# Patient Record
Sex: Female | Born: 1980 | Race: Black or African American | Hispanic: No | Marital: Married | Smoking: Never smoker
Health system: Southern US, Community
[De-identification: ages and names within clinical notes are randomized; demographics above are authoritative.]

## PROBLEM LIST (undated history)

## (undated) ENCOUNTER — Ambulatory Visit (HOSPITAL_COMMUNITY)

## (undated) DIAGNOSIS — E669 Obesity, unspecified: Secondary | ICD-10-CM

## (undated) DIAGNOSIS — I10 Essential (primary) hypertension: Secondary | ICD-10-CM

## (undated) DIAGNOSIS — E119 Type 2 diabetes mellitus without complications: Secondary | ICD-10-CM

## (undated) DIAGNOSIS — U071 COVID-19: Secondary | ICD-10-CM

## (undated) DIAGNOSIS — M79673 Pain in unspecified foot: Secondary | ICD-10-CM

## (undated) DIAGNOSIS — J45909 Unspecified asthma, uncomplicated: Secondary | ICD-10-CM

## (undated) DIAGNOSIS — F419 Anxiety disorder, unspecified: Secondary | ICD-10-CM

## (undated) HISTORY — PX: CHOLECYSTECTOMY: SHX55

## (undated) HISTORY — PX: BREAST REDUCTION SURGERY: SHX8

---

## 2001-06-16 ENCOUNTER — Emergency Department (HOSPITAL_COMMUNITY): Admission: EM | Admit: 2001-06-16 | Discharge: 2001-06-16 | Payer: Self-pay | Admitting: Emergency Medicine

## 2001-08-10 ENCOUNTER — Ambulatory Visit (HOSPITAL_COMMUNITY): Admission: RE | Admit: 2001-08-10 | Discharge: 2001-08-10 | Payer: Self-pay | Admitting: *Deleted

## 2001-08-13 ENCOUNTER — Inpatient Hospital Stay (HOSPITAL_COMMUNITY): Admission: AD | Admit: 2001-08-13 | Discharge: 2001-08-13 | Payer: Self-pay | Admitting: *Deleted

## 2001-08-26 ENCOUNTER — Inpatient Hospital Stay (HOSPITAL_COMMUNITY): Admission: AD | Admit: 2001-08-26 | Discharge: 2001-08-26 | Payer: Self-pay | Admitting: *Deleted

## 2001-09-09 ENCOUNTER — Inpatient Hospital Stay (HOSPITAL_COMMUNITY): Admission: AD | Admit: 2001-09-09 | Discharge: 2001-09-13 | Payer: Self-pay | Admitting: *Deleted

## 2001-09-09 ENCOUNTER — Encounter (INDEPENDENT_AMBULATORY_CARE_PROVIDER_SITE_OTHER): Payer: Self-pay | Admitting: Specialist

## 2004-05-12 HISTORY — PX: REDUCTION MAMMAPLASTY: SUR839

## 2007-09-03 ENCOUNTER — Emergency Department (HOSPITAL_COMMUNITY): Admission: EM | Admit: 2007-09-03 | Discharge: 2007-09-04 | Payer: Self-pay | Admitting: Emergency Medicine

## 2007-11-06 ENCOUNTER — Emergency Department (HOSPITAL_COMMUNITY): Admission: EM | Admit: 2007-11-06 | Discharge: 2007-11-06 | Payer: Self-pay | Admitting: Emergency Medicine

## 2008-01-22 ENCOUNTER — Emergency Department (HOSPITAL_COMMUNITY): Admission: EM | Admit: 2008-01-22 | Discharge: 2008-01-22 | Payer: Self-pay | Admitting: Emergency Medicine

## 2008-04-11 ENCOUNTER — Emergency Department (HOSPITAL_COMMUNITY): Admission: EM | Admit: 2008-04-11 | Discharge: 2008-04-11 | Payer: Self-pay | Admitting: Family Medicine

## 2008-08-16 ENCOUNTER — Inpatient Hospital Stay (HOSPITAL_COMMUNITY): Admission: AD | Admit: 2008-08-16 | Discharge: 2008-08-17 | Payer: Self-pay | Admitting: Obstetrics & Gynecology

## 2008-08-17 IMAGING — US US TRANSVAGINAL NON-OB
1 series · 14 of 25 positions shown · non-contrast
Comparison: None

CLINICAL DATA: Pelvic pain.

TRANSABDOMINAL AND TRANSVAGINAL ULTRASOUND OF PELVIS
TECHNIQUE: Both transabdominal and transvaginal ultrasound
examinations of the pelvis were performed including evaluation of
the uterus, ovaries, adnexal regions, and pelvic cul-de-sac.

[Series 1: us transvaginal non-ob · 14 of 50 slices shown]
[im 1/50]
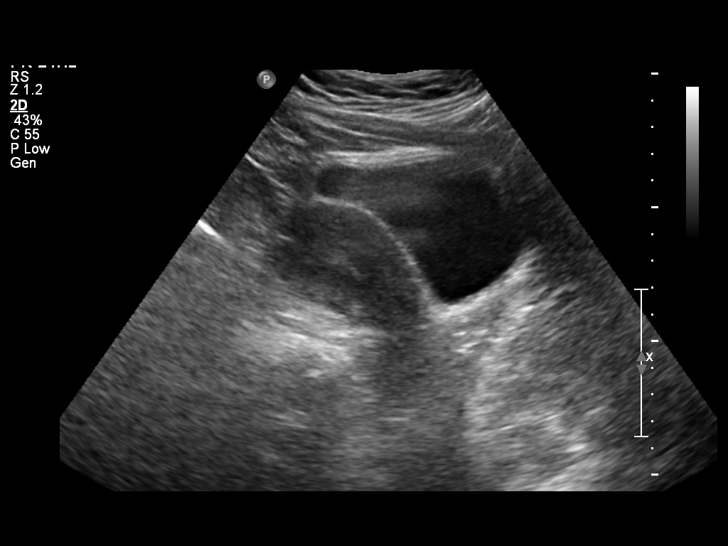
[im 5/50]
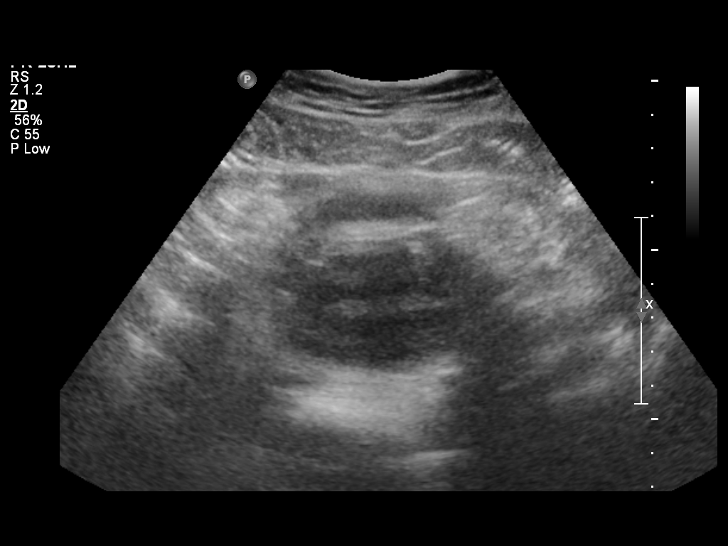
[im 9/50]
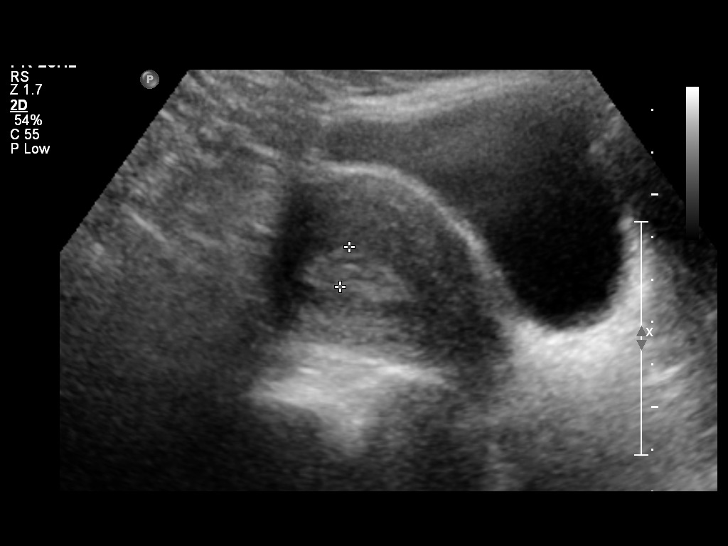
[im 13/50]
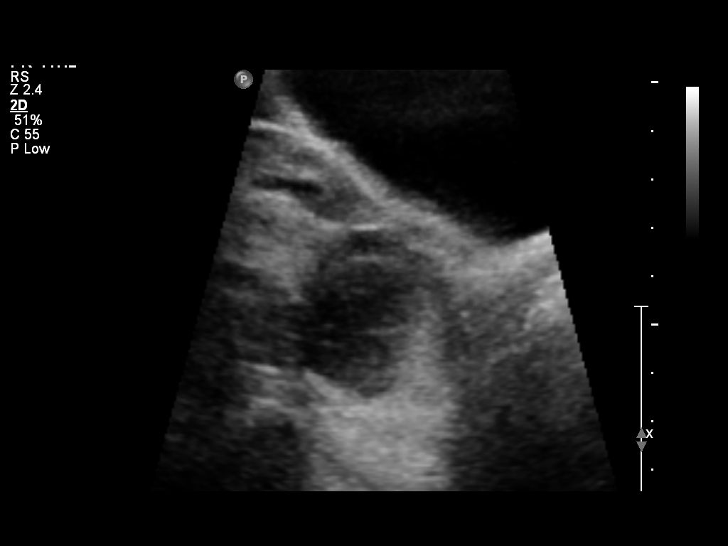
[im 17/50]
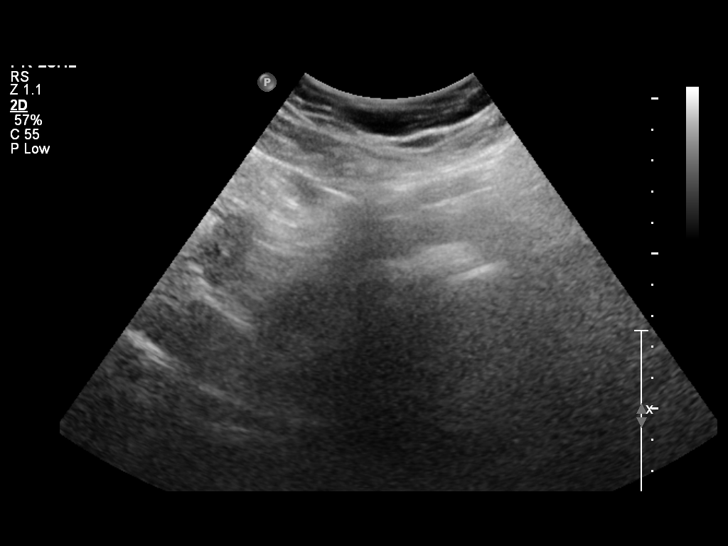
[im 19/50]
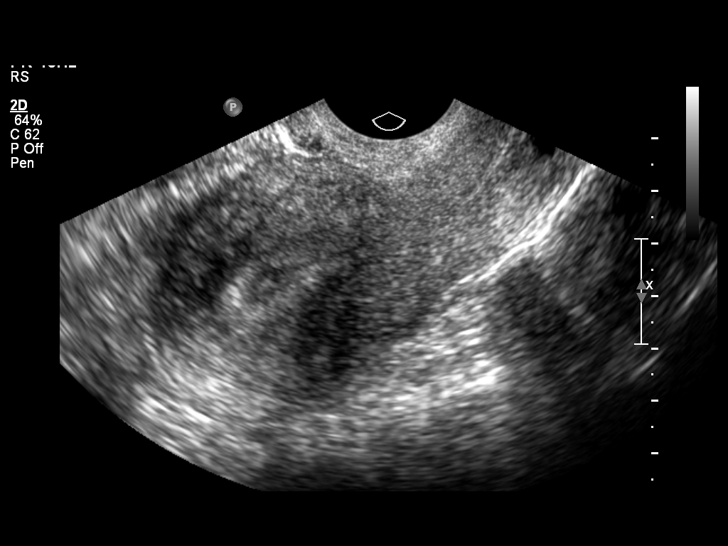
[im 23/50]
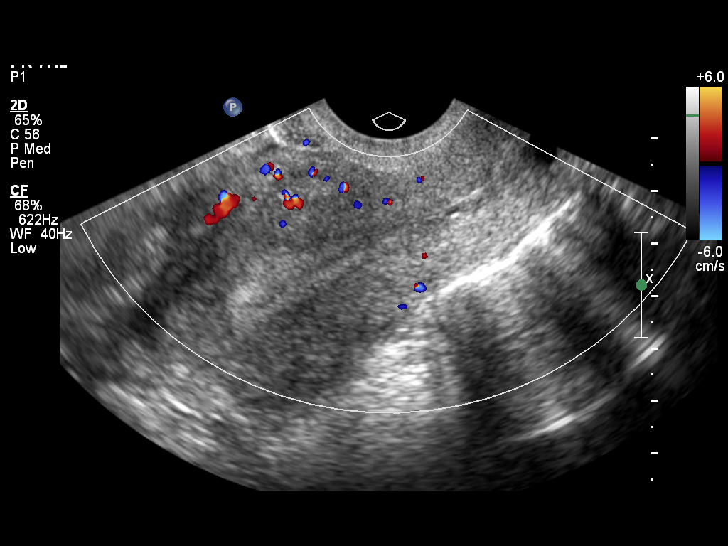
[im 27/50]
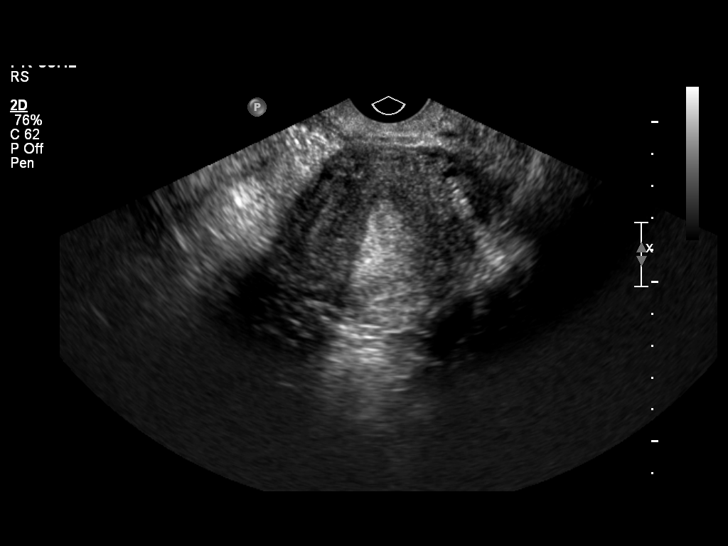
[im 31/50]
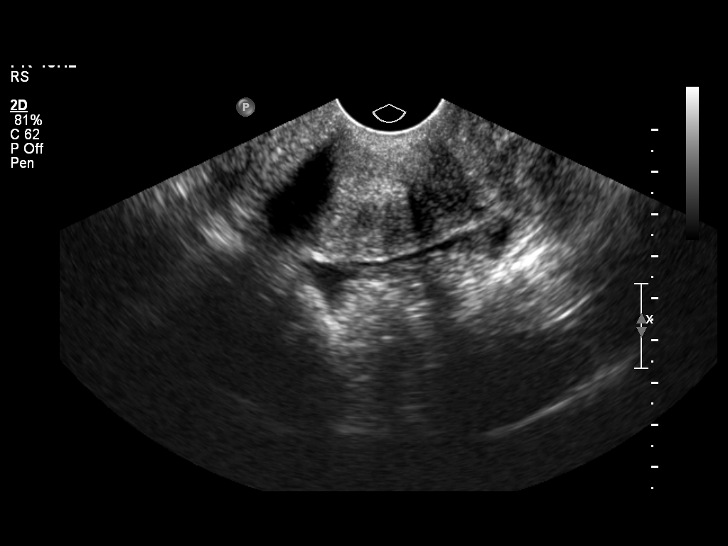
[im 33/50]
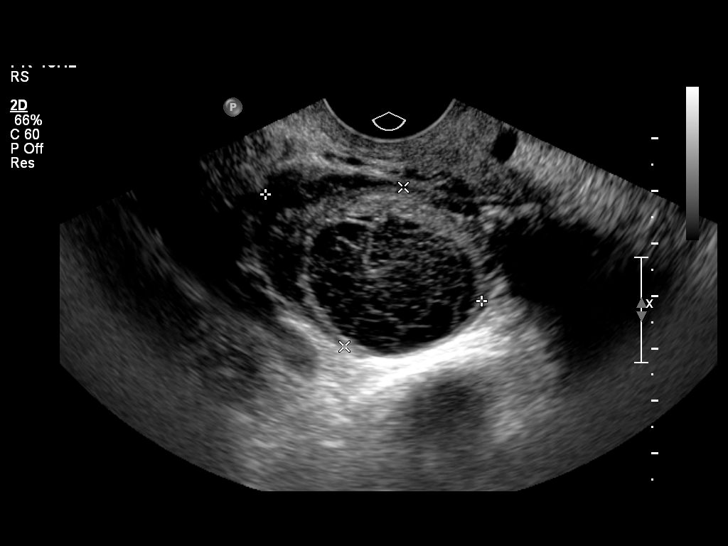
[im 37/50]
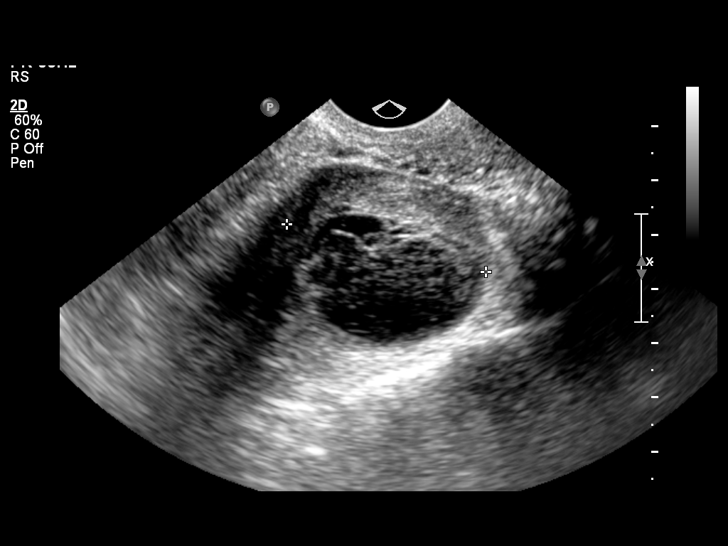
[im 41/50]
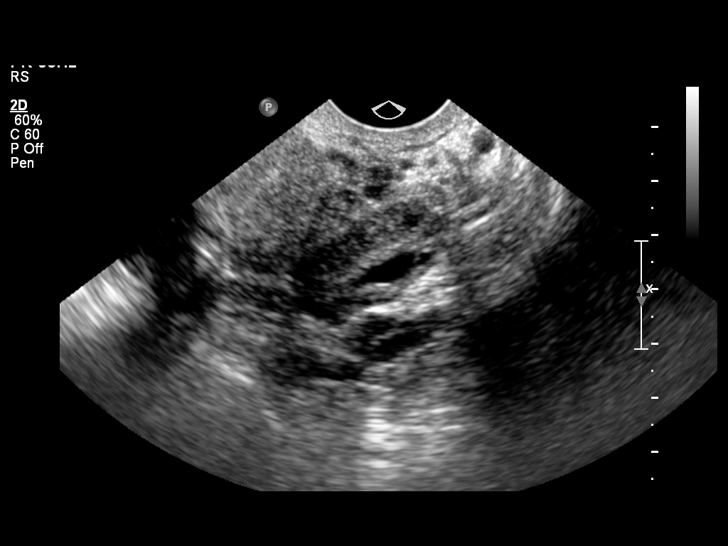
[im 45/50]
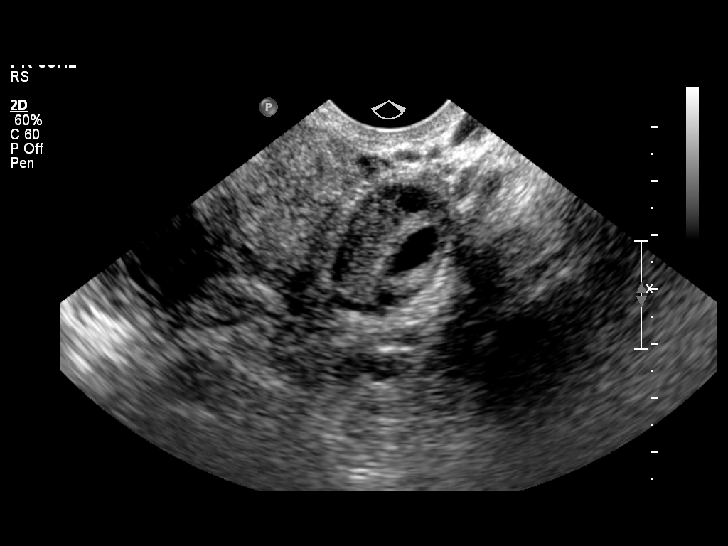
[im 50/50]
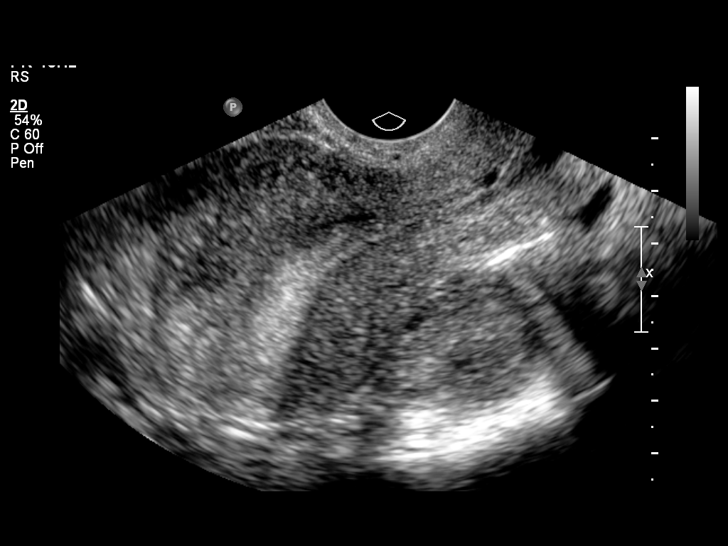

[14 of 25 positions shown; findings below may reference images not displayed]

FINDINGS: Uterus:  Normal size and appearance. No fibroids other uterine mass
identified.

Endometrium:  Within normal limits.

Right Ovary:  Complex cyst with fine thin internal septations and
no blood flow seen on color Doppler ultrasound.  This measures
by 2.6 x 2.8 cm, and likely represents a hemorrhagic ovarian cyst
although endometrioma can have a similar appearance.

Left Ovary:  Normal size and appearance.

Other Findings:  No evidence of free fluid.
IMPRESSION: 1.  3.1 cm complex right ovarian cyst, which likely represents a
hemorrhagic cyst.  Endometrioma can have a similar appearance.
Follow-up by transvaginal ultrasound is recommended in 68 weeks.
2.  Normal uterus and left ovary.

## 2008-08-27 ENCOUNTER — Inpatient Hospital Stay (HOSPITAL_COMMUNITY): Admission: AD | Admit: 2008-08-27 | Discharge: 2008-08-27 | Payer: Self-pay | Admitting: Obstetrics & Gynecology

## 2008-09-07 ENCOUNTER — Emergency Department (HOSPITAL_COMMUNITY): Admission: EM | Admit: 2008-09-07 | Discharge: 2008-09-07 | Payer: Self-pay | Admitting: Family Medicine

## 2008-10-28 ENCOUNTER — Inpatient Hospital Stay (HOSPITAL_COMMUNITY): Admission: AD | Admit: 2008-10-28 | Discharge: 2008-10-28 | Payer: Self-pay | Admitting: Obstetrics & Gynecology

## 2008-11-01 ENCOUNTER — Inpatient Hospital Stay (HOSPITAL_COMMUNITY): Admission: AD | Admit: 2008-11-01 | Discharge: 2008-11-01 | Payer: Self-pay | Admitting: Obstetrics & Gynecology

## 2008-11-13 ENCOUNTER — Inpatient Hospital Stay (HOSPITAL_COMMUNITY): Admission: AD | Admit: 2008-11-13 | Discharge: 2008-11-13 | Payer: Self-pay | Admitting: Obstetrics & Gynecology

## 2009-04-26 ENCOUNTER — Inpatient Hospital Stay (HOSPITAL_COMMUNITY): Admission: AD | Admit: 2009-04-26 | Discharge: 2009-04-26 | Payer: Self-pay | Admitting: Obstetrics & Gynecology

## 2009-04-28 ENCOUNTER — Emergency Department (HOSPITAL_COMMUNITY): Admission: EM | Admit: 2009-04-28 | Discharge: 2009-04-28 | Payer: Self-pay | Admitting: Emergency Medicine

## 2009-04-28 IMAGING — CR DG CHEST 2V
2 series · 2 of 2 positions shown · non-contrast
Comparison: None

CLINICAL DATA: Cough, congestion.

CHEST - 2 VIEW

[w chest pa]
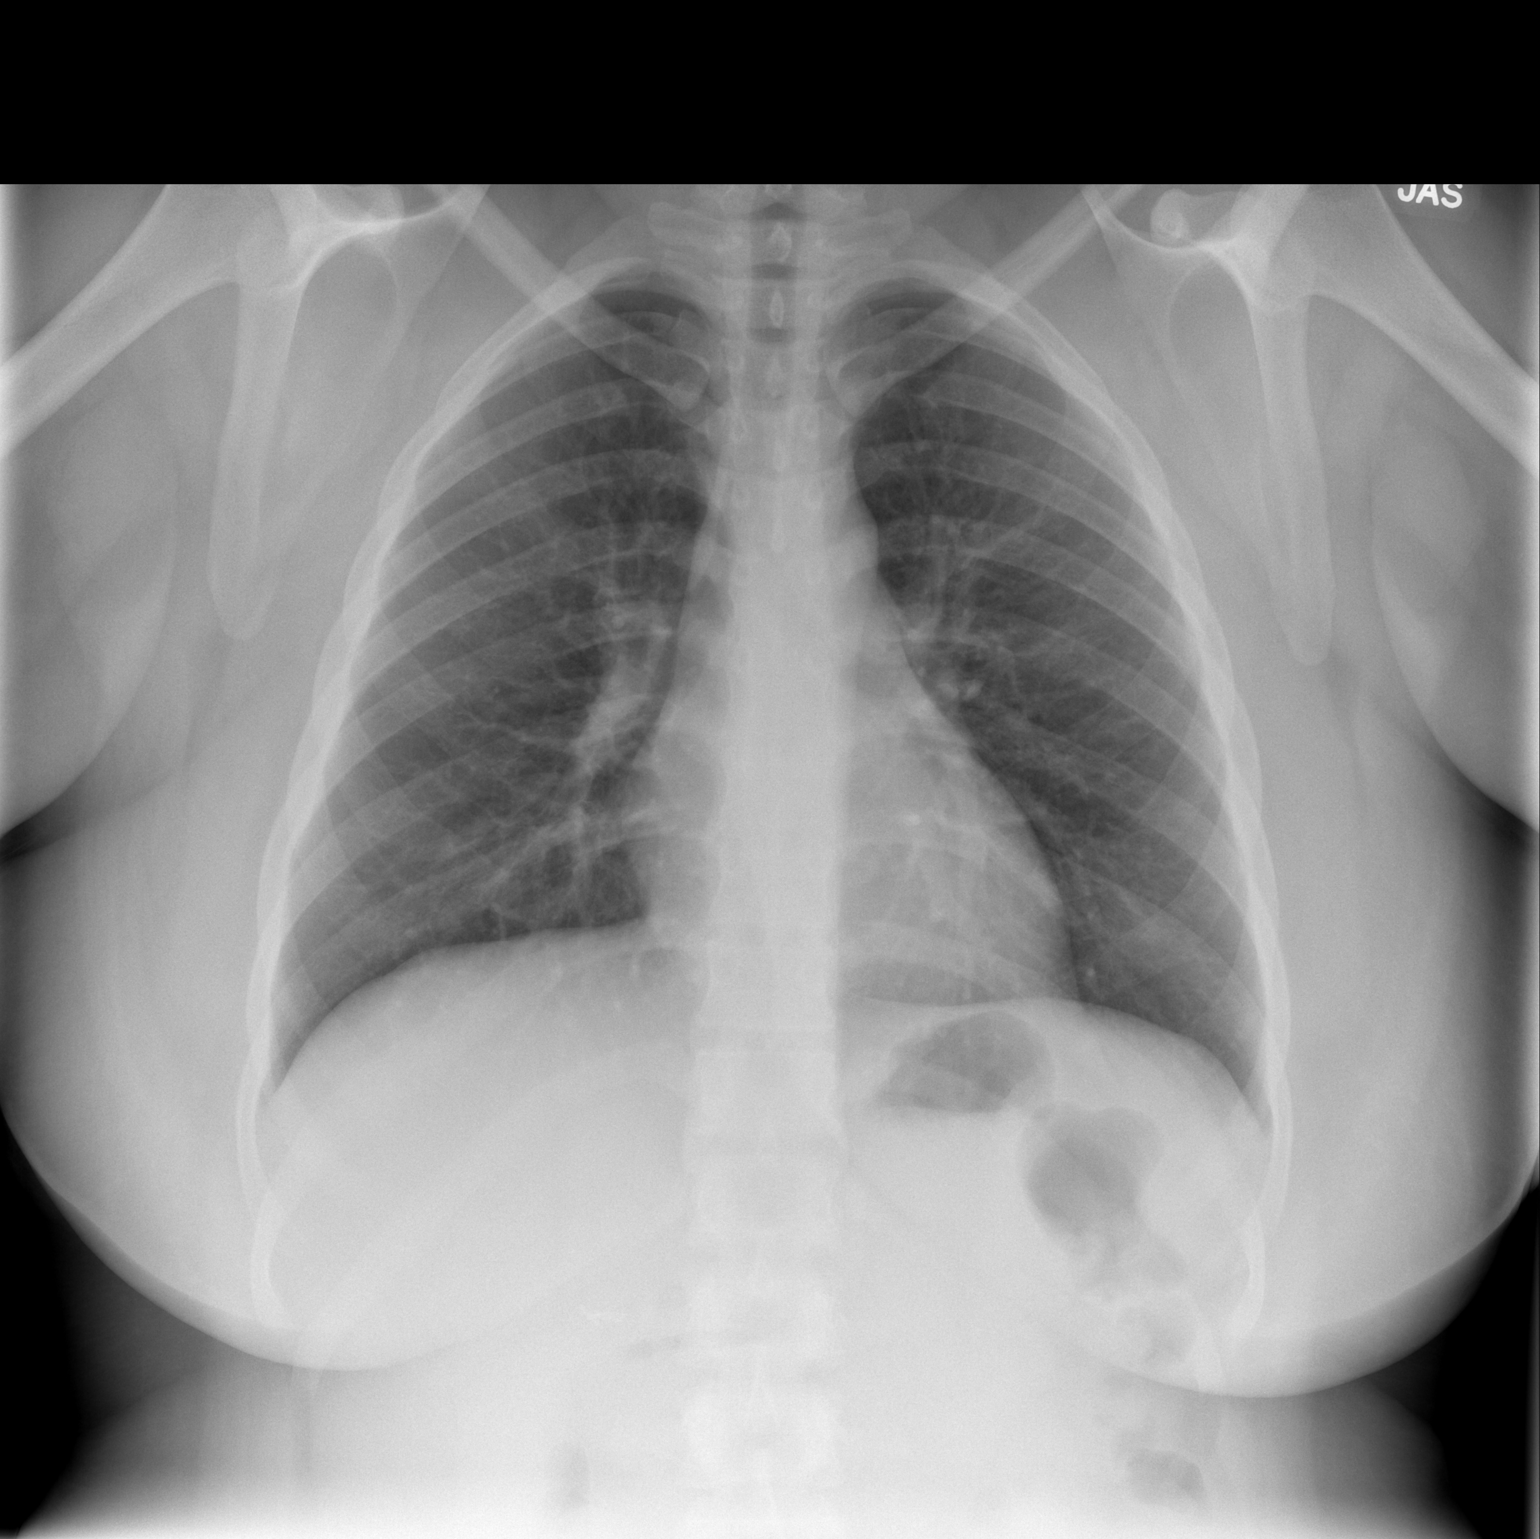

[w chest lat]
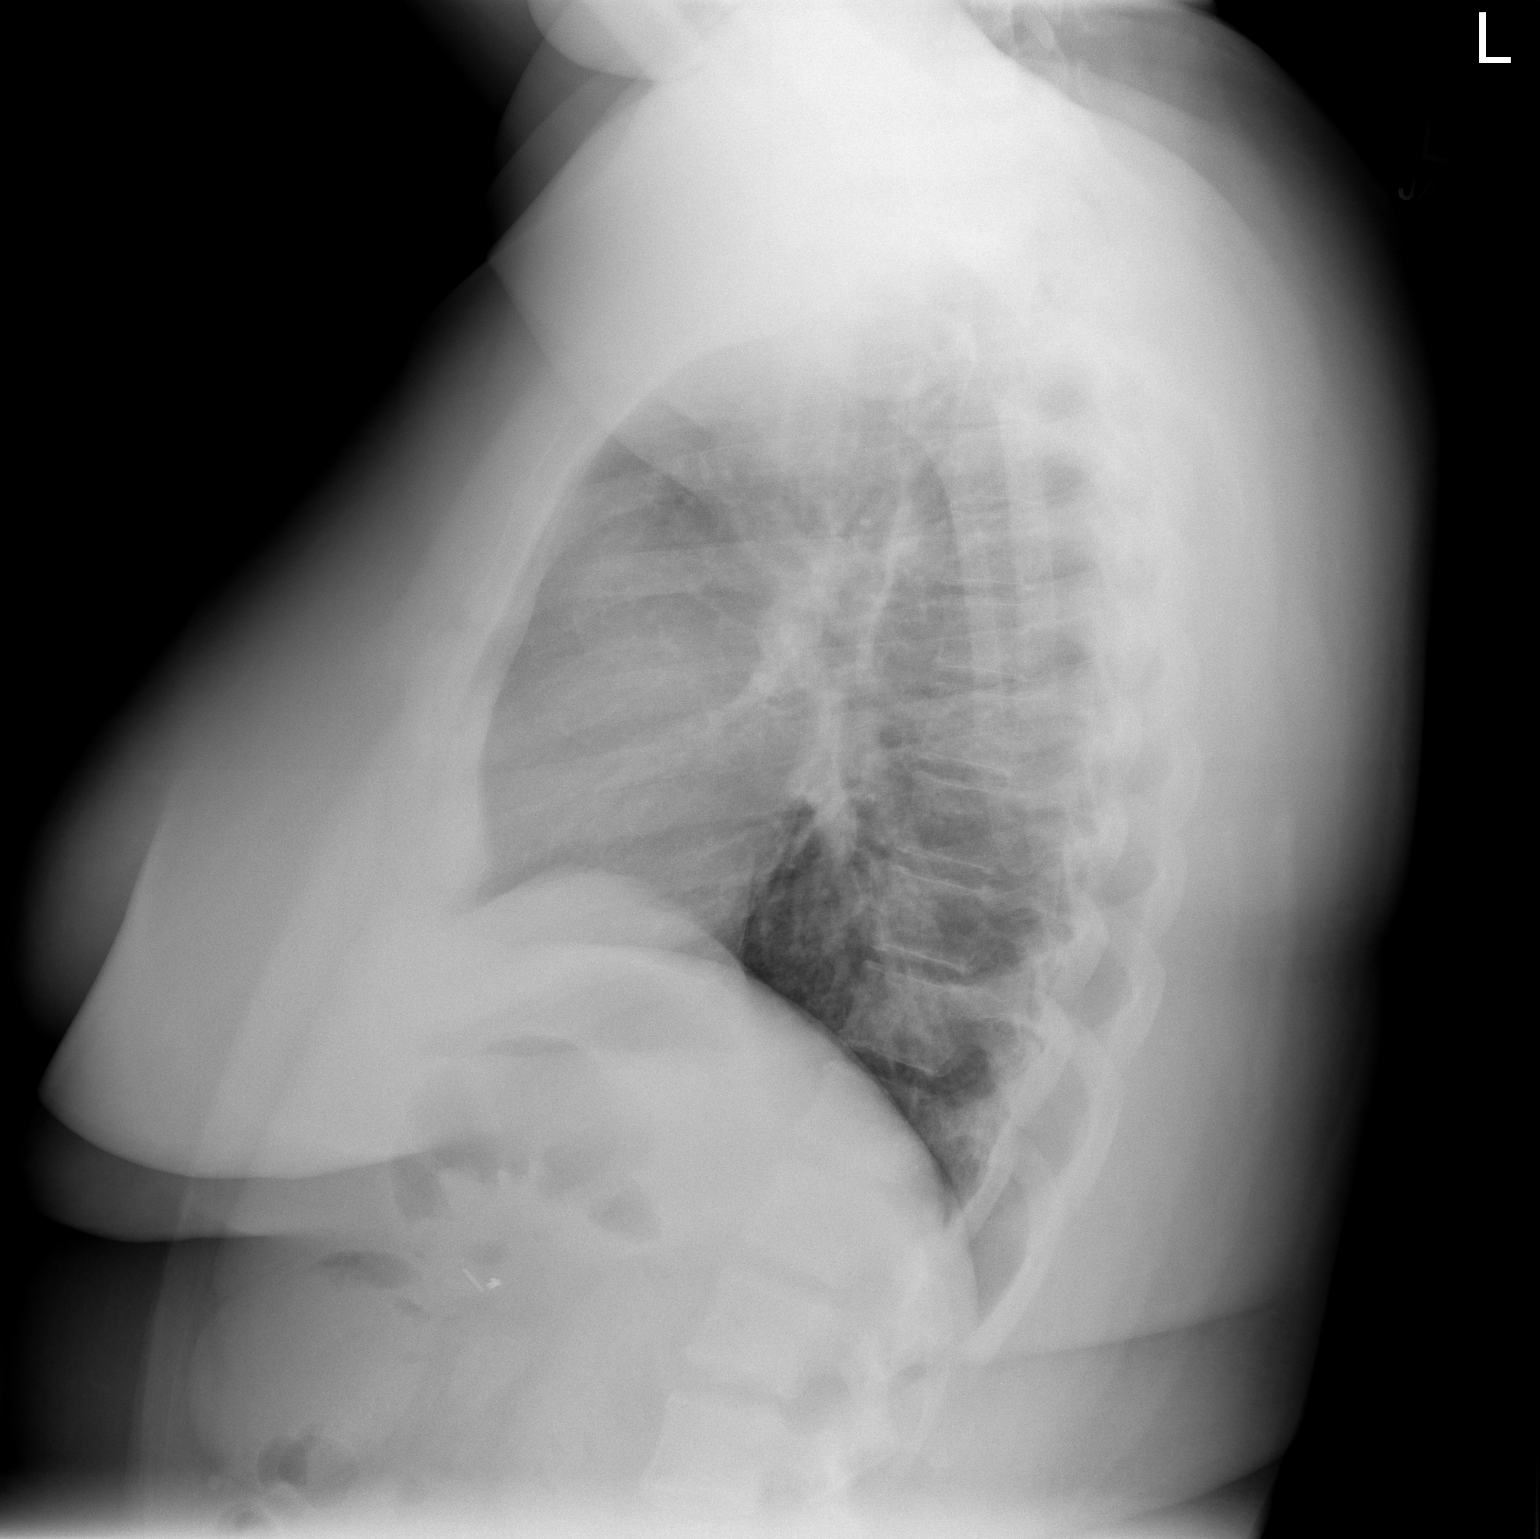

[2 of 2 positions shown; findings below may reference images not displayed]

FINDINGS: Heart and mediastinal contours are within normal limits.
No focal opacities or effusions.  No acute bony abnormality.
IMPRESSION: No active disease.

## 2009-05-20 ENCOUNTER — Inpatient Hospital Stay (HOSPITAL_COMMUNITY): Admission: AD | Admit: 2009-05-20 | Discharge: 2009-05-20 | Payer: Self-pay | Admitting: Obstetrics & Gynecology

## 2009-05-20 IMAGING — US US OB COMP LESS 14 WK
1 series · 14 of 28 positions shown · non-contrast
Comparison: none

OBSTETRICAL ULTRASOUND:
 This ultrasound exam was performed in the [HOSPITAL] Ultrasound Department.  The OB US report was generated in the AS system, and faxed to the ordering physician.  This report is also available in [HOSPITAL]?s [employer] and in [REDACTED] PACS.

[Series 1: us ob transvaginal modify · 0.21mm/px · 14 of 39 slices shown]
[im 2/39]
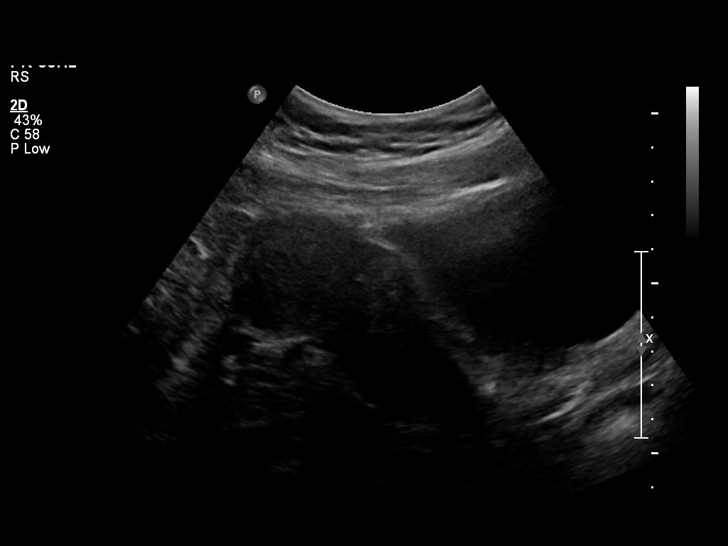
[im 5/39]
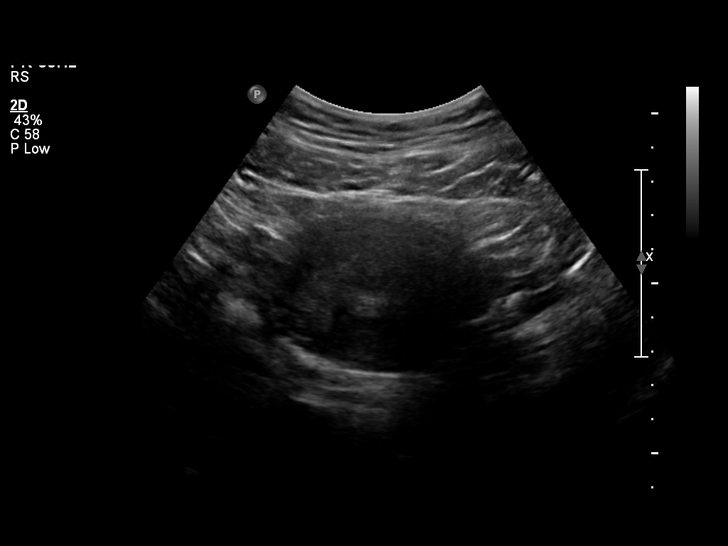
[im 8/39]
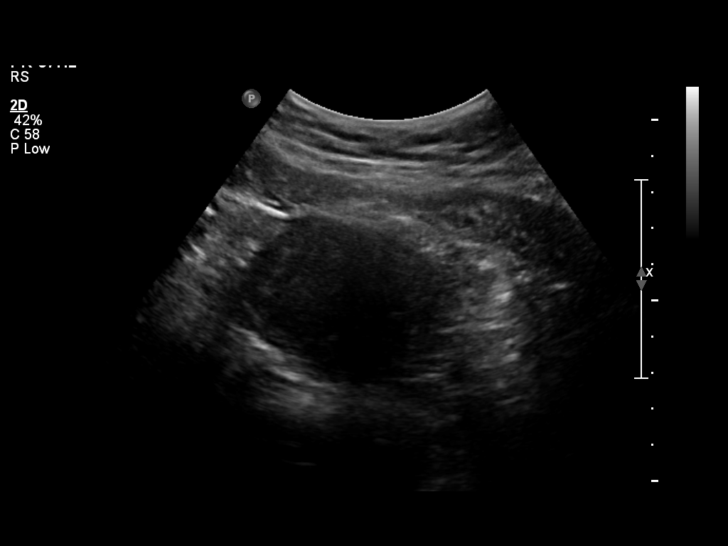
[im 10/39]
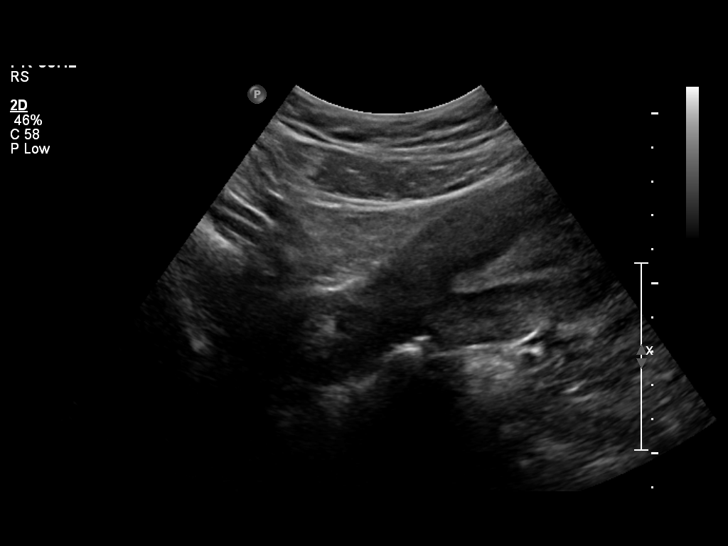
[im 13/39]
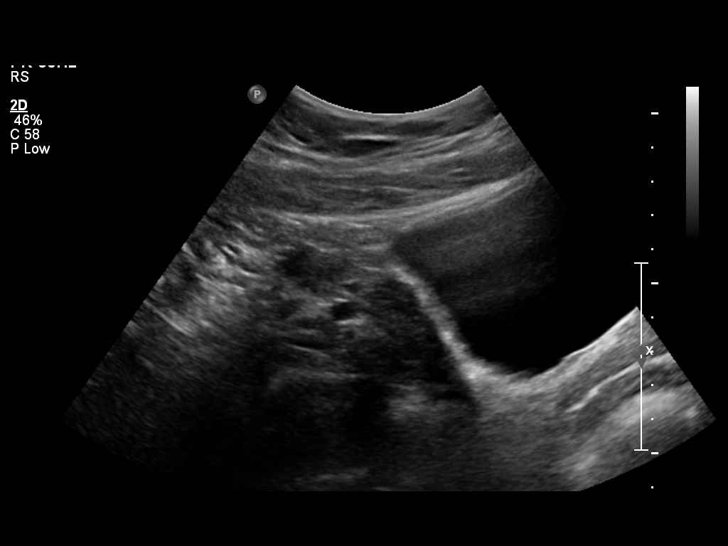
[im 16/39]
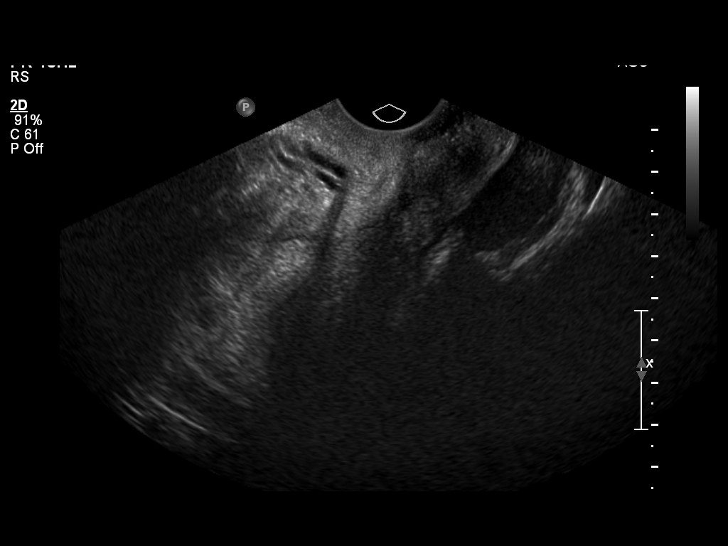
[im 19/39]
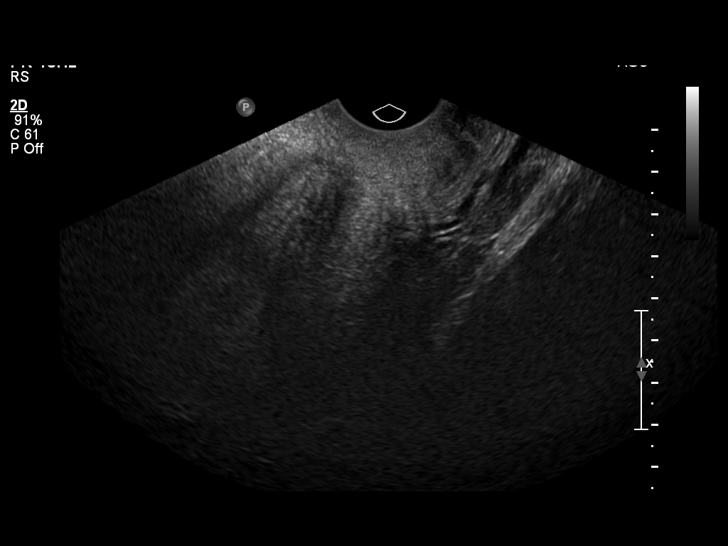
[im 22/39]
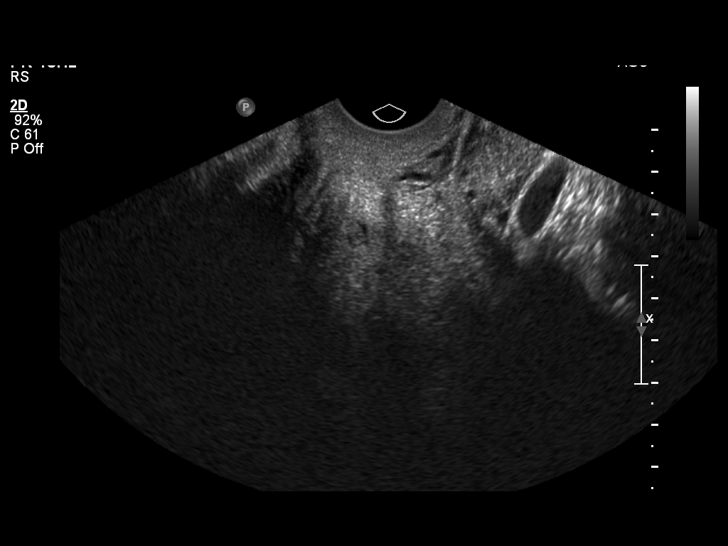
[im 24/39]
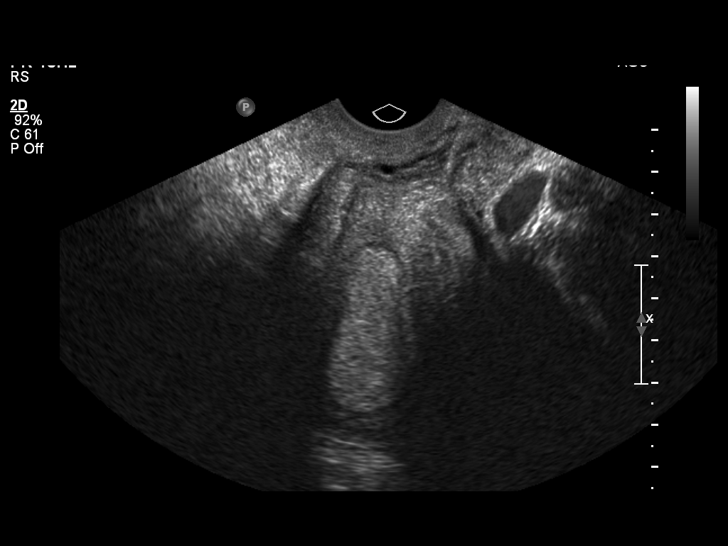
[im 27/39]
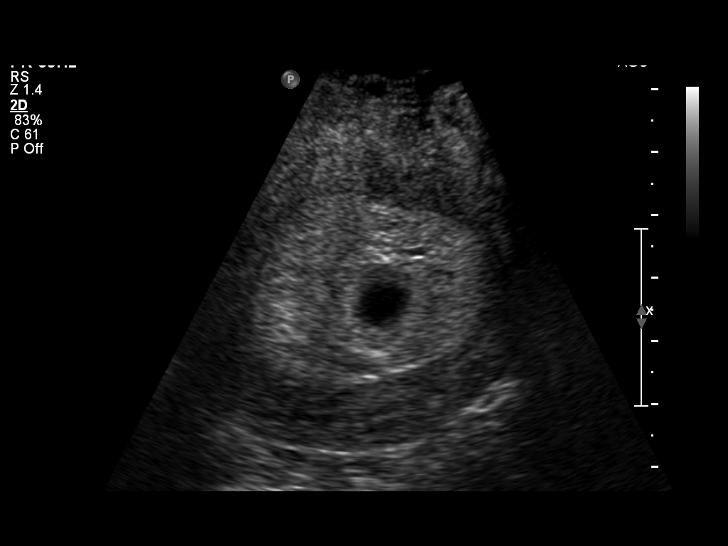
[im 30/39]
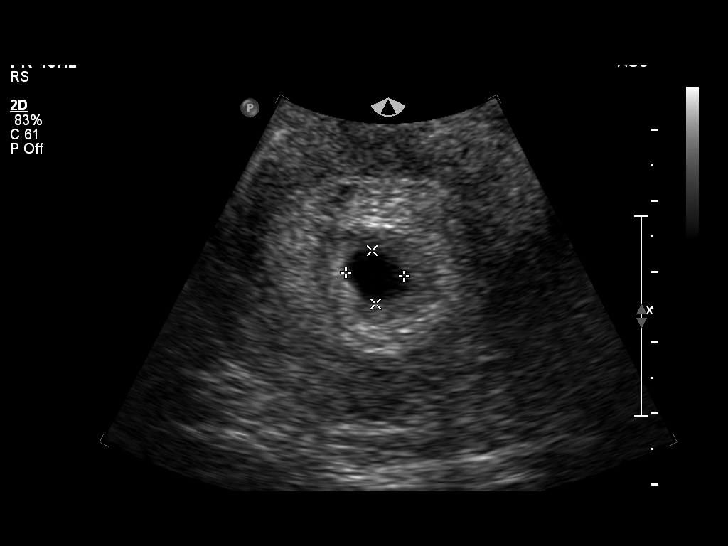
[im 33/39]
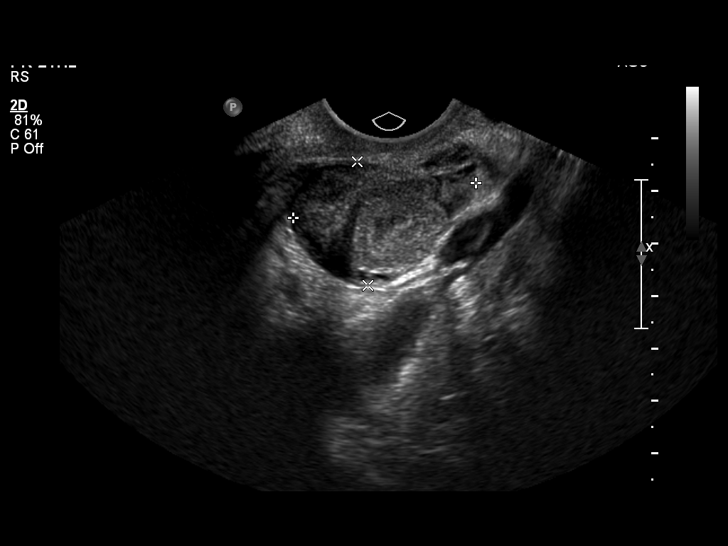
[im 36/39]
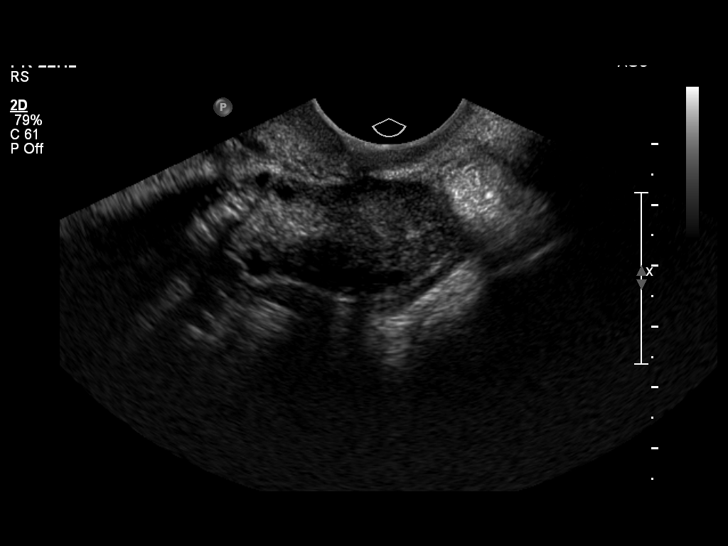
[im 39/39]
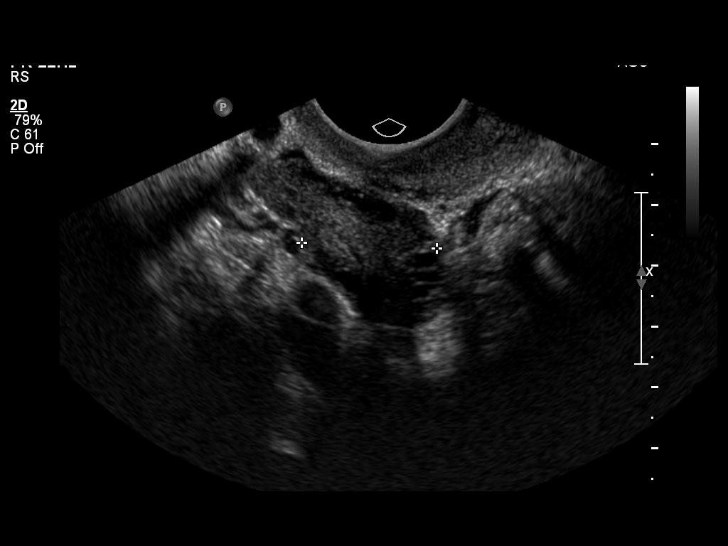

[14 of 28 positions shown; findings below may reference images not displayed]

IMPRESSION: See AS Obstetric US report.

## 2009-06-07 ENCOUNTER — Emergency Department (HOSPITAL_COMMUNITY): Admission: EM | Admit: 2009-06-07 | Discharge: 2009-06-08 | Payer: Self-pay | Admitting: Emergency Medicine

## 2010-05-14 ENCOUNTER — Emergency Department (HOSPITAL_COMMUNITY)
Admission: EM | Admit: 2010-05-14 | Discharge: 2010-05-14 | Payer: Self-pay | Source: Home / Self Care | Admitting: Emergency Medicine

## 2010-06-02 ENCOUNTER — Encounter: Payer: Self-pay | Admitting: Obstetrics & Gynecology

## 2010-06-04 ENCOUNTER — Emergency Department (HOSPITAL_COMMUNITY)
Admission: EM | Admit: 2010-06-04 | Discharge: 2010-06-04 | Payer: Self-pay | Source: Home / Self Care | Admitting: Emergency Medicine

## 2010-06-24 ENCOUNTER — Emergency Department (HOSPITAL_COMMUNITY)
Admission: EM | Admit: 2010-06-24 | Discharge: 2010-06-25 | Disposition: A | Discharge status: Home / Self Care | Payer: Medicaid Other | Attending: Emergency Medicine | Admitting: Emergency Medicine

## 2010-06-24 DIAGNOSIS — R42 Dizziness and giddiness: Secondary | ICD-10-CM | POA: Insufficient documentation

## 2010-06-24 DIAGNOSIS — E669 Obesity, unspecified: Secondary | ICD-10-CM | POA: Insufficient documentation

## 2010-06-24 DIAGNOSIS — E876 Hypokalemia: Secondary | ICD-10-CM | POA: Insufficient documentation

## 2010-06-24 DIAGNOSIS — E860 Dehydration: Secondary | ICD-10-CM | POA: Insufficient documentation

## 2010-06-24 DIAGNOSIS — G4730 Sleep apnea, unspecified: Secondary | ICD-10-CM | POA: Insufficient documentation

## 2010-06-24 DIAGNOSIS — I10 Essential (primary) hypertension: Secondary | ICD-10-CM | POA: Insufficient documentation

## 2010-06-25 LAB — DIFFERENTIAL
Basophils Absolute: 0.1 10*3/uL (ref 0.0–0.1)
Basophils Relative: 0 % (ref 0–1)
Eosinophils Absolute: 0.3 10*3/uL (ref 0.0–0.7)
Monocytes Absolute: 1.3 10*3/uL — ABNORMAL HIGH (ref 0.1–1.0)
Monocytes Relative: 10 % (ref 3–12)
Neutrophils Relative %: 44 % (ref 43–77)

## 2010-06-25 LAB — BASIC METABOLIC PANEL
Calcium: 9.3 mg/dL (ref 8.4–10.5)
GFR calc non Af Amer: >60 mL/min (ref >60)
Sodium: 140 mEq/L (ref 135–145)

## 2010-06-25 LAB — URINALYSIS, ROUTINE W REFLEX MICROSCOPIC
Hgb urine dipstick: NEGATIVE
Nitrite: NEGATIVE
Protein, ur: NEGATIVE mg/dL
Urobilinogen, UA: 1 mg/dL (ref 0.0–1.0)

## 2010-06-25 LAB — CBC
MCH: 27.8 pg (ref 26.0–34.0)
MCHC: 35 g/dL (ref 30.0–36.0)
Platelets: 290 10*3/uL (ref 150–400)

## 2010-06-25 LAB — POCT PREGNANCY, URINE

## 2010-07-01 ENCOUNTER — Ambulatory Visit: Payer: Medicaid Other | Attending: Orthopedic Surgery | Admitting: Physical Therapy

## 2010-07-01 DIAGNOSIS — IMO0001 Reserved for inherently not codable concepts without codable children: Secondary | ICD-10-CM | POA: Insufficient documentation

## 2010-07-01 DIAGNOSIS — M25673 Stiffness of unspecified ankle, not elsewhere classified: Secondary | ICD-10-CM | POA: Insufficient documentation

## 2010-07-01 DIAGNOSIS — M25579 Pain in unspecified ankle and joints of unspecified foot: Secondary | ICD-10-CM | POA: Insufficient documentation

## 2010-07-01 DIAGNOSIS — M25676 Stiffness of unspecified foot, not elsewhere classified: Secondary | ICD-10-CM | POA: Insufficient documentation

## 2010-07-10 ENCOUNTER — Ambulatory Visit: Payer: Medicaid Other | Admitting: Physical Therapy

## 2010-07-18 ENCOUNTER — Ambulatory Visit: Payer: Medicaid Other | Attending: Orthopedic Surgery | Admitting: Physical Therapy

## 2010-07-18 DIAGNOSIS — IMO0001 Reserved for inherently not codable concepts without codable children: Secondary | ICD-10-CM | POA: Insufficient documentation

## 2010-07-18 DIAGNOSIS — M25676 Stiffness of unspecified foot, not elsewhere classified: Secondary | ICD-10-CM | POA: Insufficient documentation

## 2010-07-18 DIAGNOSIS — M25579 Pain in unspecified ankle and joints of unspecified foot: Secondary | ICD-10-CM | POA: Insufficient documentation

## 2010-07-18 DIAGNOSIS — M25673 Stiffness of unspecified ankle, not elsewhere classified: Secondary | ICD-10-CM | POA: Insufficient documentation

## 2010-07-26 ENCOUNTER — Ambulatory Visit: Payer: Medicaid Other | Admitting: Physical Therapy

## 2010-07-28 LAB — CBC
Hemoglobin: 12.4 g/dL (ref 12.0–15.0)
MCHC: 33.3 g/dL (ref 30.0–36.0)
Platelets: 262 10*3/uL (ref 150–400)
RDW: 13.8 % (ref 11.5–15.5)

## 2010-07-28 LAB — ABO/RH: ABO/RH(D): O POS

## 2010-08-12 LAB — DIFFERENTIAL
Lymphocytes Relative: 23 % (ref 12–46)
Lymphs Abs: 2.7 10*3/uL (ref 0.7–4.0)
Neutro Abs: 7.7 10*3/uL (ref 1.7–7.7)
Neutrophils Relative %: 65 % (ref 43–77)

## 2010-08-12 LAB — COMPREHENSIVE METABOLIC PANEL
ALT: 27 U/L (ref 0–35)
Alkaline Phosphatase: 81 U/L (ref 39–117)
CO2: 31 mEq/L (ref 19–32)
GFR calc non Af Amer: >60 mL/min (ref >60)
Glucose, Bld: 103 mg/dL — ABNORMAL HIGH (ref 70–99)
Potassium: 4.2 mEq/L (ref 3.5–5.1)
Sodium: 137 mEq/L (ref 135–145)

## 2010-08-12 LAB — URINE MICROSCOPIC-ADD ON

## 2010-08-12 LAB — CBC
Platelets: 298 10*3/uL (ref 150–400)
WBC: 11.9 10*3/uL — ABNORMAL HIGH (ref 4.0–10.5)

## 2010-08-12 LAB — URINE CULTURE: Colony Count: >=100000

## 2010-08-12 LAB — WET PREP, GENITAL
Trich, Wet Prep: NONE SEEN
WBC, Wet Prep HPF POC: NONE SEEN
Yeast Wet Prep HPF POC: NONE SEEN

## 2010-08-12 LAB — URINALYSIS, ROUTINE W REFLEX MICROSCOPIC
Bilirubin Urine: NEGATIVE
Ketones, ur: NEGATIVE mg/dL
Nitrite: NEGATIVE
pH: 6 (ref 5.0–8.0)

## 2010-08-19 LAB — URINALYSIS, ROUTINE W REFLEX MICROSCOPIC
Bilirubin Urine: NEGATIVE
Ketones, ur: 15 mg/dL — AB
Nitrite: POSITIVE — AB
pH: 6 (ref 5.0–8.0)

## 2010-08-19 LAB — POCT PREGNANCY, URINE

## 2010-08-19 LAB — URINE MICROSCOPIC-ADD ON

## 2010-08-21 LAB — CBC
HCT: 38.9 % (ref 36.0–46.0)
Hemoglobin: 12.8 g/dL (ref 12.0–15.0)
MCHC: 33 g/dL (ref 30.0–36.0)
MCV: 84.1 fL (ref 78.0–100.0)
Platelets: 259 10*3/uL (ref 150–400)
Platelets: 296 K/uL (ref 150–400)
RBC: 4.63 MIL/uL (ref 3.87–5.11)
RDW: 13.8 % (ref 11.5–15.5)
RDW: 13.7 % (ref 11.5–15.5)
WBC: 8.6 10*3/uL (ref 4.0–10.5)
WBC: 8.6 K/uL (ref 4.0–10.5)

## 2010-08-21 LAB — DIFFERENTIAL
Basophils Absolute: 0 10*3/uL (ref 0.0–0.1)
Basophils Absolute: 0 K/uL (ref 0.0–0.1)
Basophils Relative: 0 % (ref 0–1)
Eosinophils Absolute: 0.2 K/uL (ref 0.0–0.7)
Eosinophils Relative: 3 % (ref 0–5)
Eosinophils Relative: 2 % (ref 0–5)
Lymphocytes Relative: 35 % (ref 12–46)
Lymphocytes Relative: 34 % (ref 12–46)
Lymphs Abs: 3 10*3/uL (ref 0.7–4.0)
Lymphs Abs: 2.9 K/uL (ref 0.7–4.0)
Monocytes Absolute: 0.8 K/uL (ref 0.1–1.0)
Monocytes Relative: 9 % (ref 3–12)
Neutro Abs: 4.4 10*3/uL (ref 1.7–7.7)
Neutro Abs: 4.7 K/uL (ref 1.7–7.7)
Neutrophils Relative %: 52 % (ref 43–77)
Neutrophils Relative %: 54 % (ref 43–77)

## 2010-08-21 LAB — COMPREHENSIVE METABOLIC PANEL
BUN: 6 mg/dL (ref 6–23)
CO2: 29 mEq/L (ref 19–32)
Calcium: 9.5 mg/dL (ref 8.4–10.5)
Chloride: 104 mEq/L (ref 96–112)
Creatinine, Ser: 0.76 mg/dL (ref 0.4–1.2)
GFR calc non Af Amer: >60 mL/min (ref >60)
Total Bilirubin: 0.5 mg/dL (ref 0.3–1.2)

## 2010-08-21 LAB — URINALYSIS, ROUTINE W REFLEX MICROSCOPIC
Bilirubin Urine: NEGATIVE
Bilirubin Urine: NEGATIVE
Glucose, UA: NEGATIVE mg/dL
Glucose, UA: NEGATIVE mg/dL
Hgb urine dipstick: NEGATIVE
Ketones, ur: NEGATIVE mg/dL
Ketones, ur: NEGATIVE mg/dL
Leukocytes, UA: NEGATIVE
Nitrite: NEGATIVE
Nitrite: NEGATIVE
Protein, ur: NEGATIVE mg/dL
Protein, ur: NEGATIVE mg/dL
Specific Gravity, Urine: 1.015 (ref 1.005–1.030)
Specific Gravity, Urine: 1.015 (ref 1.005–1.030)
Urobilinogen, UA: 0.2 mg/dL (ref 0.0–1.0)
Urobilinogen, UA: 0.2 mg/dL (ref 0.0–1.0)
pH: 6.5 (ref 5.0–8.0)
pH: 7 (ref 5.0–8.0)

## 2010-08-21 LAB — URINE MICROSCOPIC-ADD ON

## 2010-08-21 LAB — WET PREP, GENITAL
Trich, Wet Prep: NONE SEEN
Trich, Wet Prep: NONE SEEN
Yeast Wet Prep HPF POC: NONE SEEN
Yeast Wet Prep HPF POC: NONE SEEN

## 2010-08-21 LAB — GC/CHLAMYDIA PROBE AMP, GENITAL
Chlamydia, DNA Probe: NEGATIVE
Chlamydia, DNA Probe: NEGATIVE
GC Probe Amp, Genital: NEGATIVE
GC Probe Amp, Genital: NEGATIVE

## 2010-09-27 NOTE — Discharge Summary (Signed)
[facility]  Patient:    Robin Davis, Robin Davis Visit Number: [id number] MRN: [id number]          Service Type: OBS Location: 910A [id number] Attending Physician:  Enid Cutter Dictated by:   Conni Elliot, M.D. Admit Date:  09/09/2001 Discharge Date: 09/13/2001                             Discharge Summary  DATE OF BIRTH:                1980-06-28.  HISTORY OF PRESENT ILLNESS:   This 30 year old, G3, P2, 1-0-2, was admitted on Sep 09, 2001 for induction of labor secondary to mild preeclampsia at term. She underwent induction of labor with Cervidil x 2 and low dose Pitocin per protocol.  She remained stable throughout the labor process on magnesium sulfate drip.  Her fetus showed no indication of stress.  She was delivered of a viable female infant by normal spontaneous vaginal delivery at 2017 on Sep 11, 2001.  Infant Apgars were 3 in eight and 9 at one, five, and ten minutes respectively.  Infant was stable and taken to newborn nursery.  Mom remained on magnesium postpartum x 24 hours after which time it was discontinued.  Other factors during her hospitalization, the patient developed a severe headache that was consistent with a diagnosis of spinal headaches.  She underwent IV fluid boluses which resolved her headache.  Anesthesia was consulted for assistance with management.  At discharge, the patient received Depo-Provera for contraception.  She was breast and bottle feeding and was on iron for anemia.  DISCHARGE MEDICATIONS:        Ibuprofen, iron, and prenatal vitamins.  DIET:                         Regular.  ACTIVITY:                     Pelvic rest and otherwise ad-lib.  FOLLOW-UP:                    She is to be seen in [facility] in six weeks.  CONDITION ON DISCHARGE:       Improved.  DISCHARGE DIAGNOSES:          1. Intrauterine pregnancy at term.                               2. Mild preeclampsia.              3. Induction of labor.                               4. Normal spontaneous vaginal delivery.                               5. Postpartum magnesium.                               6. Spinal headache resolved. Dictated by:   Conni Elliot, M.D. Attending Physician:  Enid Cutter DD:  11/05/01 TD:  11/08/01 Job: [id number]

## 2010-11-16 ENCOUNTER — Emergency Department (HOSPITAL_COMMUNITY): Payer: Medicaid Other

## 2010-11-16 ENCOUNTER — Emergency Department (HOSPITAL_COMMUNITY)
Admission: EM | Admit: 2010-11-16 | Discharge: 2010-11-16 | Disposition: A | Discharge status: Home / Self Care | Payer: Medicaid Other | Attending: Emergency Medicine | Admitting: Emergency Medicine

## 2010-11-16 DIAGNOSIS — R0789 Other chest pain: Secondary | ICD-10-CM | POA: Insufficient documentation

## 2010-11-16 DIAGNOSIS — R059 Cough, unspecified: Secondary | ICD-10-CM | POA: Insufficient documentation

## 2010-11-16 DIAGNOSIS — I10 Essential (primary) hypertension: Secondary | ICD-10-CM | POA: Insufficient documentation

## 2010-11-16 DIAGNOSIS — R05 Cough: Secondary | ICD-10-CM | POA: Insufficient documentation

## 2010-11-16 DIAGNOSIS — J40 Bronchitis, not specified as acute or chronic: Secondary | ICD-10-CM | POA: Insufficient documentation

## 2010-11-16 DIAGNOSIS — R070 Pain in throat: Secondary | ICD-10-CM | POA: Insufficient documentation

## 2010-11-16 DIAGNOSIS — Z79899 Other long term (current) drug therapy: Secondary | ICD-10-CM | POA: Insufficient documentation

## 2010-11-16 IMAGING — CR DG CHEST 2V
2 series · 2 of 2 positions shown · non-contrast
Comparison: 04/28/2009

CLINICAL DATA: Short of breath and cough.

CHEST - 2 VIEW

[w chest pa]
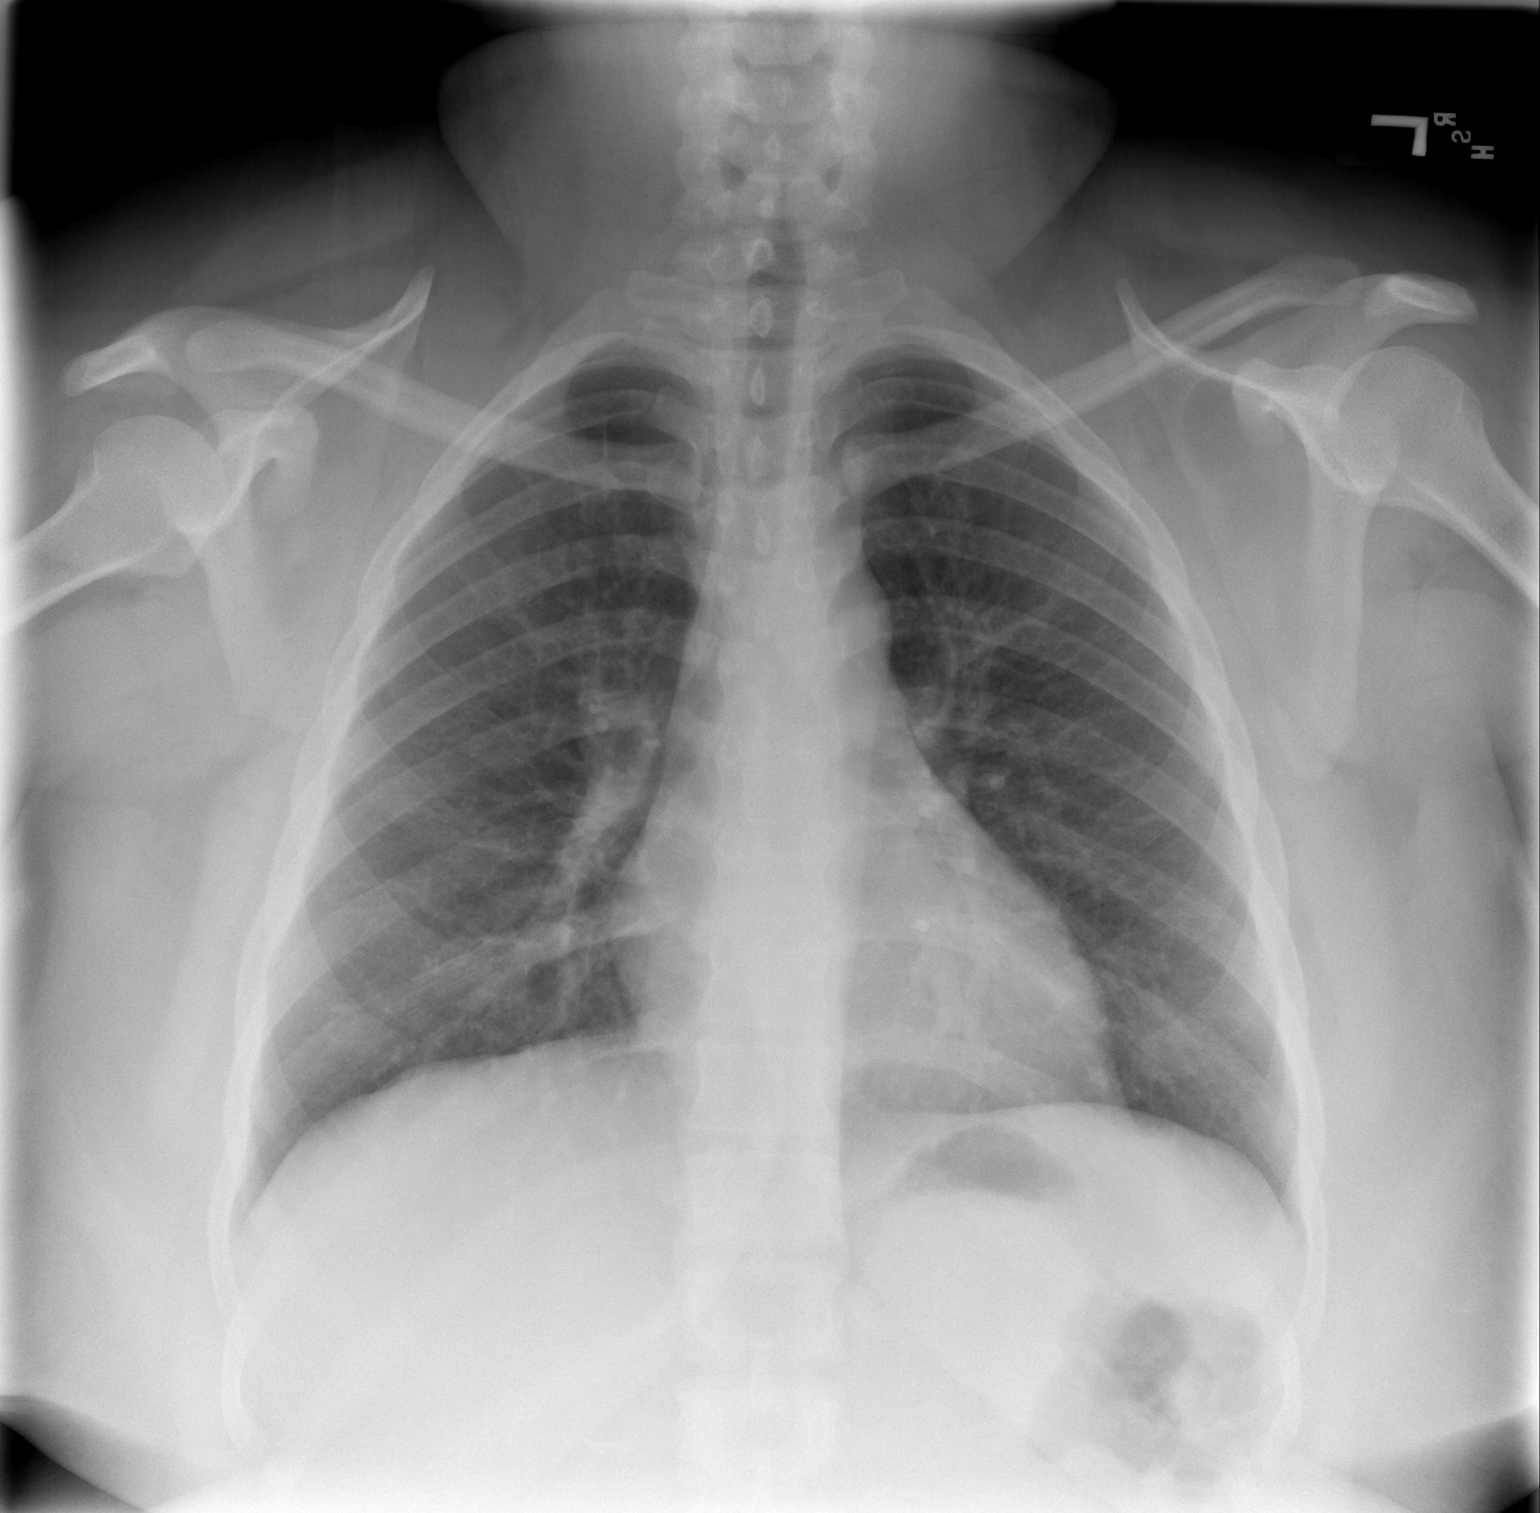

[w chest lat]
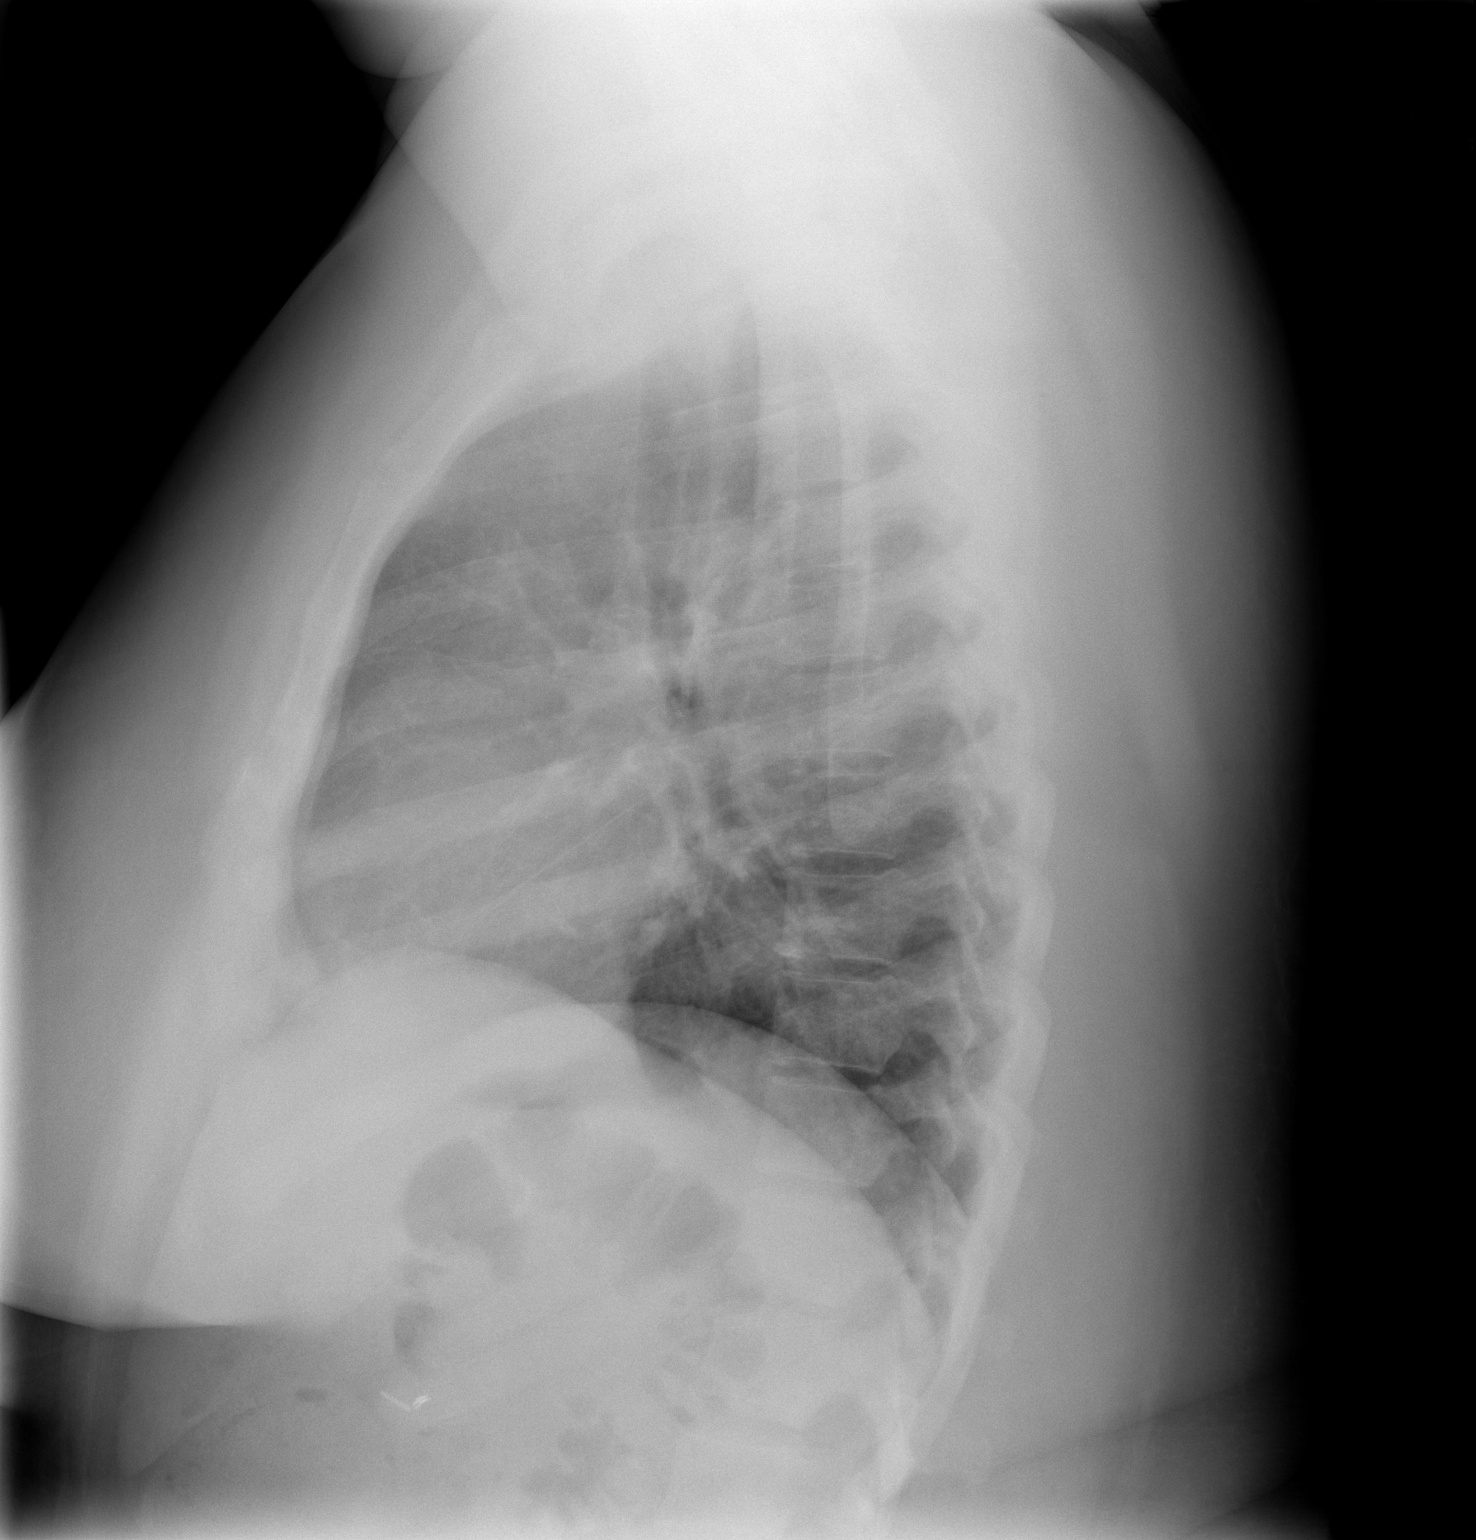

[2 of 2 positions shown; findings below may reference images not displayed]

FINDINGS: The heart size and pulmonary vascularity are normal. The
lungs appear clear and expanded without focal air space disease or
consolidation. No blunting of the costophrenic angles.  No
significant changes since the previous study.
IMPRESSION: No evidence of active pulmonary disease.

## 2011-02-06 LAB — DIFFERENTIAL
Basophils Relative: 5 — ABNORMAL HIGH
Eosinophils Absolute: 0.2
Eosinophils Relative: 2
Lymphs Abs: 3.4

## 2011-02-06 LAB — CBC
HCT: 40.9
MCHC: 33.3
MCV: 83.1
Platelets: 354
RDW: 13.8
WBC: 8

## 2011-02-06 LAB — POCT I-STAT, CHEM 8
BUN: 9
Calcium, Ion: 1.09 — ABNORMAL LOW
Chloride: 105
Sodium: 138

## 2011-02-06 LAB — URINALYSIS, ROUTINE W REFLEX MICROSCOPIC
Glucose, UA: NEGATIVE
Ketones, ur: NEGATIVE
Protein, ur: NEGATIVE

## 2011-02-06 LAB — POCT PREGNANCY, URINE: Operator id: 133351

## 2011-07-01 ENCOUNTER — Emergency Department (INDEPENDENT_AMBULATORY_CARE_PROVIDER_SITE_OTHER): Payer: Medicaid Other

## 2011-07-01 ENCOUNTER — Emergency Department (HOSPITAL_COMMUNITY)
Admission: EM | Admit: 2011-07-01 | Discharge: 2011-07-01 | Disposition: A | Discharge status: Home / Self Care | Payer: Medicaid Other | Source: Home / Self Care

## 2011-07-01 ENCOUNTER — Encounter (HOSPITAL_COMMUNITY): Payer: Self-pay | Admitting: *Deleted

## 2011-07-01 DIAGNOSIS — B9789 Other viral agents as the cause of diseases classified elsewhere: Secondary | ICD-10-CM

## 2011-07-01 HISTORY — DX: Essential (primary) hypertension: I10

## 2011-07-01 IMAGING — CR DG CHEST 2V
2 series · 2 of 2 positions shown · non-contrast
Comparison: 11/16/2010.

CLINICAL DATA: Cough, chest congestion and shortness of breath.

CHEST - 2 VIEW

[view not recorded (1 of 2)]
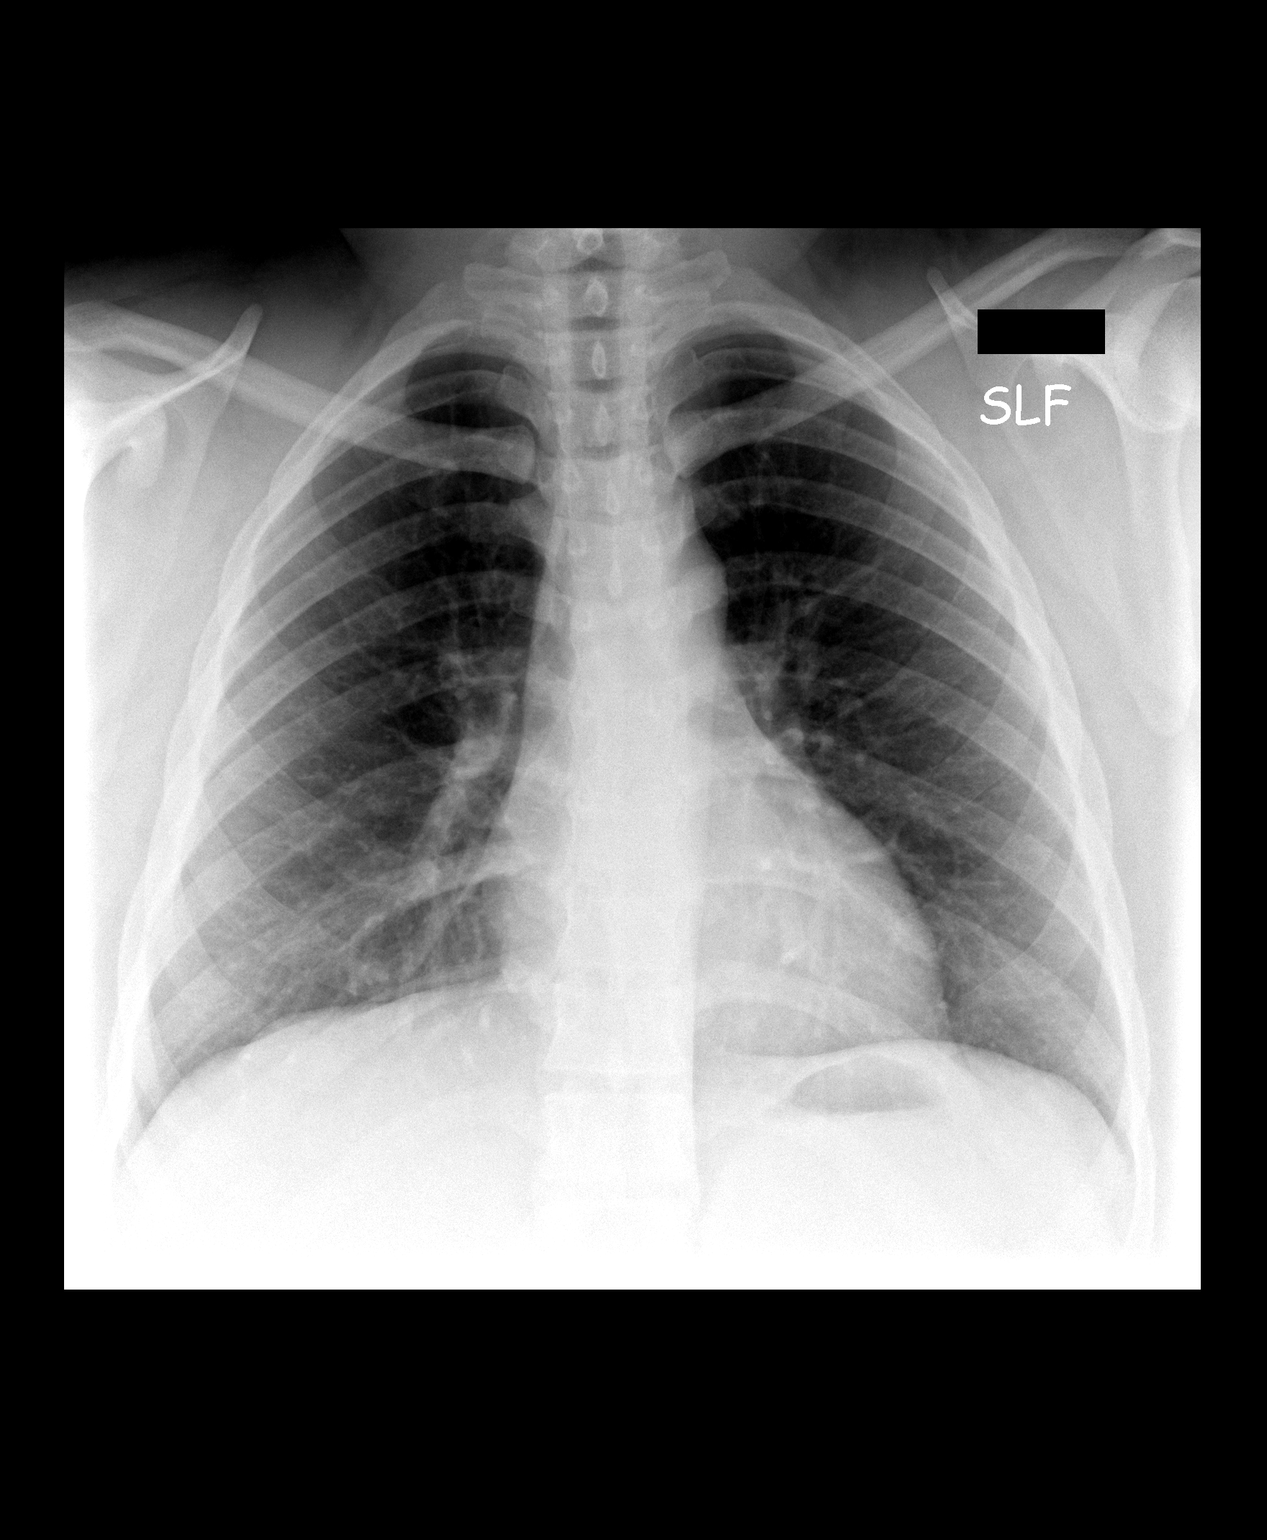

[view not recorded (2 of 2)]
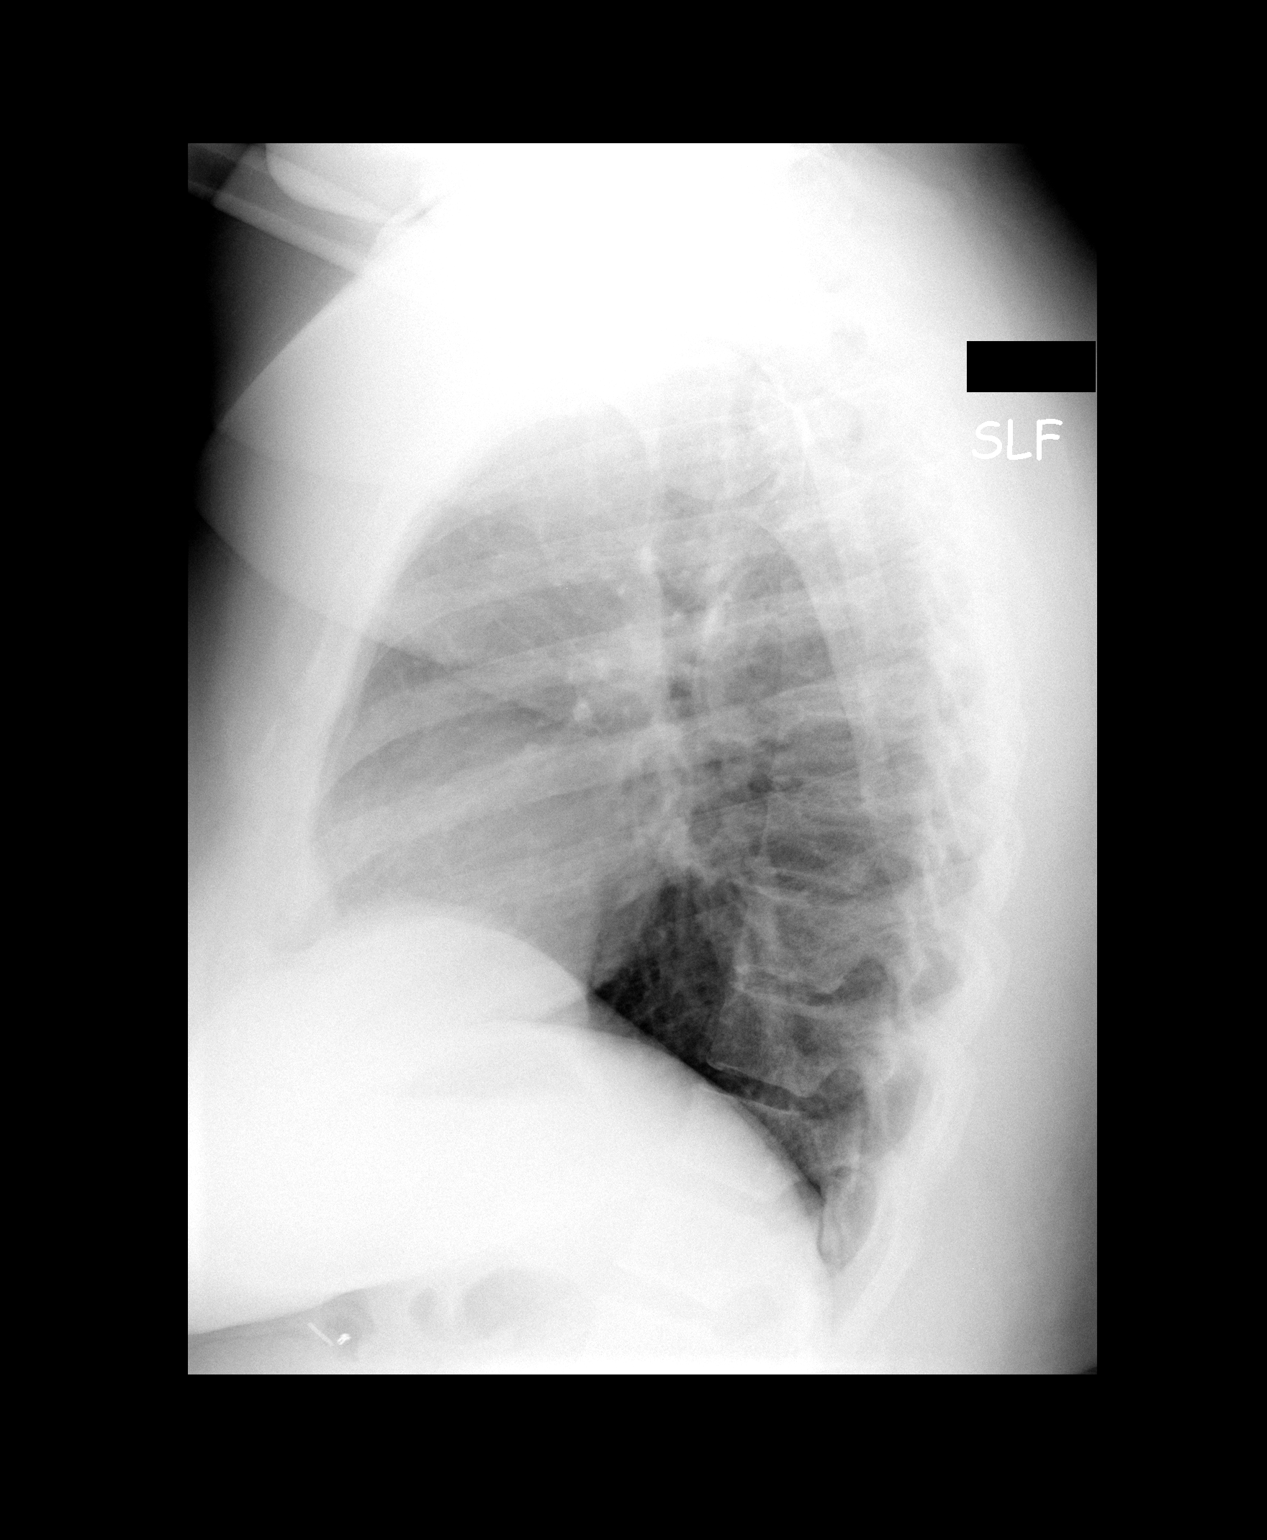

[2 of 2 positions shown; findings below may reference images not displayed]

FINDINGS: The heart remains normal in size.  Clear lungs with
normal vascularity.  Normal appearing bones.  Cholecystectomy
clips.
IMPRESSION: No acute abnormality.

## 2011-07-01 MED ORDER — PROMETHAZINE-DM 6.25-15 MG/5ML PO SYRP: 5.0000 mL | ORAL_SOLUTION | Freq: Four times a day (QID) | ORAL | Status: AC | PRN

## 2011-07-01 MED ORDER — ALBUTEROL SULFATE HFA 108 (90 BASE) MCG/ACT IN AERS
2.0000 | INHALATION_SPRAY | Freq: Four times a day (QID) | RESPIRATORY_TRACT | Status: DC
  Administered 2011-07-01 (×3): 2 via RESPIRATORY_TRACT

## 2011-07-01 MED ORDER — ALBUTEROL SULFATE HFA 108 (90 BASE) MCG/ACT IN AERS: 2.0000 | INHALATION_SPRAY | Freq: Four times a day (QID) | RESPIRATORY_TRACT | Status: DC

## 2011-07-01 MED ORDER — ALBUTEROL SULFATE HFA 108 (90 BASE) MCG/ACT IN AERS
INHALATION_SPRAY | RESPIRATORY_TRACT | Status: AC
  Filled 2011-07-01: qty 6.7

## 2011-07-01 NOTE — ED Notes (Signed)
Pt  Reports  Congestion   sorethroat      Body  Aches       Aching  All  Over  With  Hoarse  Voice     -  Pt  Has been taking  OTC  DECONGESTANTS  AS  WELL  -  SHE  REPORTS  SHE  WAS  [place]  OVER  W[place]END      AT A  CONVENTION

## 2011-07-01 NOTE — Discharge Instructions (Signed)

## 2011-07-01 NOTE — ED Provider Notes (Signed)
History     CSN: [id number]  Arrival date & time 07/01/11  1703   None     Chief Complaint  Patient presents with  . Nasal Congestion    (Consider location/radiation/quality/duration/timing/severity/associated sxs/prior treatment) HPI Comments: Patient reports she has had three days of cough, productive of yellow and brown sputum, SOB, sore throat, nasal congestion and rhinorrhea, body aches and chills.  It has been taking over-the-counter cold medications without improvement.  Patient has also had a few episodes of posttussive emesis.  Patient has a sick contact in friend with same symptoms.  The history is provided by the patient.    Past Medical History  Diagnosis Date  . Hypertension     Past Surgical History  Procedure Date  . Cholecystectomy   . Breast reduction surgery     No family history on file.  History  Substance Use Topics  . Smoking status: Not on file  . Smokeless tobacco: Not on file  . Alcohol Use: Yes    OB History    Grav Para Term Preterm Abortions TAB SAB Ect Mult Living                  Review of Systems  Allergies  Flagyl; Macrobid; Macrolides and ketolides; and Vicodin  Home Medications   Current Outpatient Rx  Name Route Sig Dispense Refill  . BENAZEPRIL HCL 20 MG PO TABS Oral Take 20 mg by mouth daily.    Marland Kitchen HYDROCHLOROTHIAZIDE 12.5 MG PO CAPS Oral Take 12.5 mg by mouth daily.      BP 172/119  Pulse 90  Temp(Src) 98.9 F (37.2 C) (Oral)  Resp 18  SpO2 97%  LMP 06/30/2011  Physical Exam  Nursing note and vitals reviewed. Constitutional: She is oriented to person, place, and time. She appears well-developed and well-nourished.  HENT:  Head: Normocephalic and atraumatic.  Right Ear: Tympanic membrane normal.  Left Ear: Tympanic membrane normal.  Nose: Mucosal edema and rhinorrhea present.  Mouth/Throat: Uvula is midline and mucous membranes are normal. Mucous membranes are not dry. No uvula swelling. Posterior  oropharyngeal erythema present. No oropharyngeal exudate, posterior oropharyngeal edema or tonsillar abscesses.  Neck: Trachea normal, normal range of motion and phonation normal. Neck supple. No tracheal tenderness present. No rigidity. No tracheal deviation and normal range of motion present.  Cardiovascular: Normal rate and regular rhythm.   Pulmonary/Chest: Effort normal. No stridor. No respiratory distress. She has no wheezes. She has no rales. She exhibits no tenderness.       Pt with hesitant, slow breathing with cough.    Neurological: She is alert and oriented to person, place, and time.    ED Course  Procedures (including critical care time)  Labs Reviewed - No data to display Dg Chest 2 View  07/01/2011  *RADIOLOGY REPORT*  Clinical Data: Cough, chest congestion and shortness of breath.  CHEST - 2 VIEW  Comparison: 11/16/2010.  Findings: The heart remains normal in size.  Clear lungs with normal vascularity.  Normal appearing bones.  Cholecystectomy clips.  IMPRESSION: No acute abnormality.  Original Report Authenticated By: Darrol Angel, M.D.     1. Viral respiratory illness       MDM  Patient with flu-like illness x 3-4 days.  Afebrile here, CXR negative for pneumonia.  Pt does not have major medical problems and has been sick for greater than 3 days, do not think Tamiflu will be beneficial for this patient.  Patient d/c home with inhaler  and cough medication, advised to continue tylenol and ibuprofen, return for worsening symptoms.  Patient verbalizes understanding and agrees with plan.          Dillard Cannon [place], [place] 07/01/11 2139

## 2011-07-02 NOTE — ED Provider Notes (Signed)
Medical screening examination/treatment/procedure(s) were performed by non-physician practitioner and as supervising physician I was immediately available for consultation/collaboration.   [facility]; MD   Sharin Grave, MD 07/02/11 [phone]

## 2011-08-19 ENCOUNTER — Emergency Department (HOSPITAL_COMMUNITY): Payer: Medicaid Other

## 2011-08-19 ENCOUNTER — Encounter (HOSPITAL_COMMUNITY): Payer: Self-pay | Admitting: *Deleted

## 2011-08-19 ENCOUNTER — Emergency Department (HOSPITAL_COMMUNITY)
Admission: EM | Admit: 2011-08-19 | Discharge: 2011-08-19 | Disposition: A | Discharge status: Home / Self Care | Payer: Medicaid Other | Attending: Emergency Medicine | Admitting: Emergency Medicine

## 2011-08-19 DIAGNOSIS — K529 Noninfective gastroenteritis and colitis, unspecified: Secondary | ICD-10-CM

## 2011-08-19 DIAGNOSIS — K5289 Other specified noninfective gastroenteritis and colitis: Secondary | ICD-10-CM | POA: Insufficient documentation

## 2011-08-19 DIAGNOSIS — R10811 Right upper quadrant abdominal tenderness: Secondary | ICD-10-CM | POA: Insufficient documentation

## 2011-08-19 DIAGNOSIS — R197 Diarrhea, unspecified: Secondary | ICD-10-CM | POA: Insufficient documentation

## 2011-08-19 DIAGNOSIS — Z9889 Other specified postprocedural states: Secondary | ICD-10-CM | POA: Insufficient documentation

## 2011-08-19 DIAGNOSIS — R112 Nausea with vomiting, unspecified: Secondary | ICD-10-CM | POA: Insufficient documentation

## 2011-08-19 DIAGNOSIS — R109 Unspecified abdominal pain: Secondary | ICD-10-CM | POA: Insufficient documentation

## 2011-08-19 DIAGNOSIS — I10 Essential (primary) hypertension: Secondary | ICD-10-CM | POA: Insufficient documentation

## 2011-08-19 DIAGNOSIS — Z79899 Other long term (current) drug therapy: Secondary | ICD-10-CM | POA: Insufficient documentation

## 2011-08-19 LAB — CBC
HCT: 42.3 % (ref 36.0–46.0)
Hemoglobin: 14.7 g/dL (ref 12.0–15.0)
MCH: 28.3 pg (ref 26.0–34.0)
MCHC: 34.8 g/dL (ref 30.0–36.0)
MCV: 81.3 fL (ref 78.0–100.0)
Platelets: 280 10*3/uL (ref 150–400)
RBC: 5.2 MIL/uL — ABNORMAL HIGH (ref 3.87–5.11)
RDW: 13.6 % (ref 11.5–15.5)
WBC: 8.4 10*3/uL (ref 4.0–10.5)

## 2011-08-19 LAB — LIPASE, BLOOD: Lipase: 35 U/L (ref 11–59)

## 2011-08-19 LAB — COMPREHENSIVE METABOLIC PANEL
ALT: 22 U/L (ref 0–35)
AST: 20 U/L (ref 0–37)
Albumin: 4 g/dL (ref 3.5–5.2)
Alkaline Phosphatase: 78 U/L (ref 39–117)
BUN: 10 mg/dL (ref 6–23)
CO2: 26 mEq/L (ref 19–32)
Calcium: 9.4 mg/dL (ref 8.4–10.5)
Chloride: 98 mEq/L (ref 96–112)
Creatinine, Ser: 0.68 mg/dL (ref 0.50–1.10)
GFR calc Af Amer: >90 mL/min (ref >90)
GFR calc non Af Amer: >90 mL/min (ref >90)
Glucose, Bld: 129 mg/dL — ABNORMAL HIGH (ref 70–99)
Potassium: 3.2 mEq/L — ABNORMAL LOW (ref 3.5–5.1)
Sodium: 137 mEq/L (ref 135–145)
Total Bilirubin: 0.8 mg/dL (ref 0.3–1.2)
Total Protein: 7.9 g/dL (ref 6.0–8.3)

## 2011-08-19 LAB — URINALYSIS, ROUTINE W REFLEX MICROSCOPIC
Bilirubin Urine: NEGATIVE
Glucose, UA: NEGATIVE mg/dL
Hgb urine dipstick: NEGATIVE
Ketones, ur: NEGATIVE mg/dL
Nitrite: NEGATIVE
Protein, ur: NEGATIVE mg/dL
Specific Gravity, Urine: 1.026 (ref 1.005–1.030)
Urobilinogen, UA: 1 mg/dL (ref 0.0–1.0)
pH: 6 (ref 5.0–8.0)

## 2011-08-19 LAB — DIFFERENTIAL
Basophils Absolute: 0 10*3/uL (ref 0.0–0.1)
Basophils Relative: 0 % (ref 0–1)
Eosinophils Absolute: 0 10*3/uL (ref 0.0–0.7)
Eosinophils Relative: 0 % (ref 0–5)
Lymphocytes Relative: 7 % — ABNORMAL LOW (ref 12–46)
Lymphs Abs: 0.6 10*3/uL — ABNORMAL LOW (ref 0.7–4.0)
Monocytes Absolute: 0.9 10*3/uL (ref 0.1–1.0)
Monocytes Relative: 11 % (ref 3–12)
Neutro Abs: 6.8 10*3/uL (ref 1.7–7.7)
Neutrophils Relative %: 81 % — ABNORMAL HIGH (ref 43–77)

## 2011-08-19 LAB — URINE MICROSCOPIC-ADD ON

## 2011-08-19 LAB — PREGNANCY, URINE: Preg Test, Ur: NEGATIVE

## 2011-08-19 IMAGING — US US ABDOMEN COMPLETE
1 series · 14 of 25 positions shown · non-contrast
Comparison: None.

CLINICAL DATA: Abdominal pain, history of prior cholecystectomy

ABDOMINAL ULTRASOUND COMPLETE

[Series 1: us abdomen complete · 0.35mm/px · 14 of 53 slices shown]
[im 1/53]
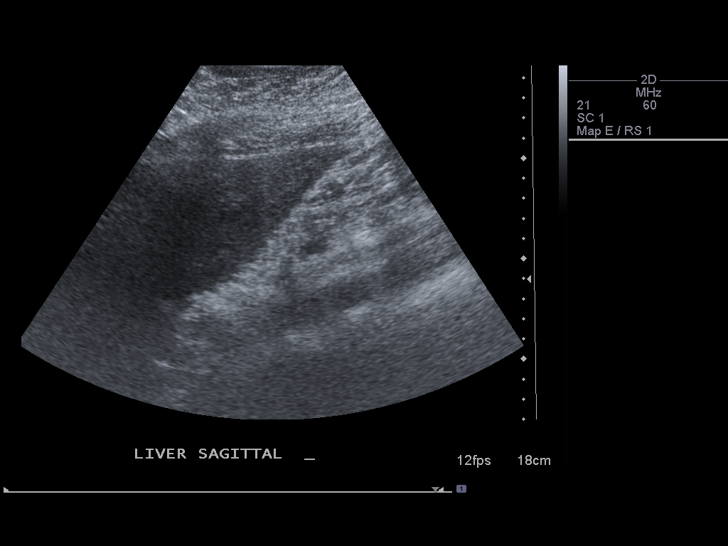
[im 5/53]
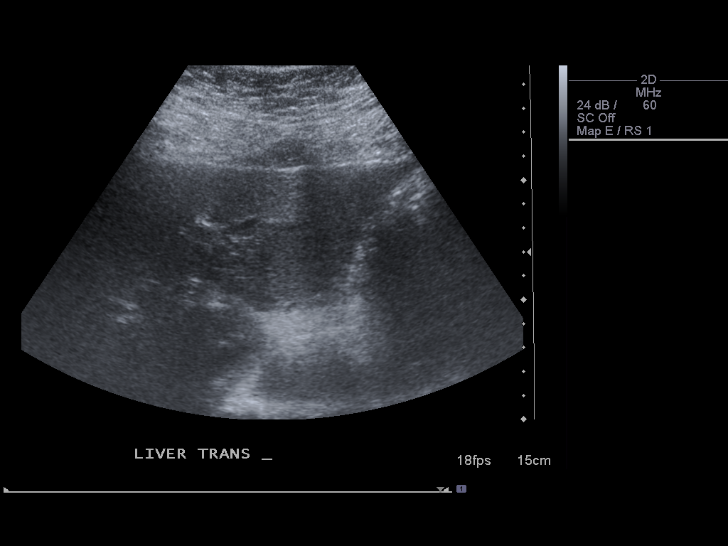
[im 9/53]
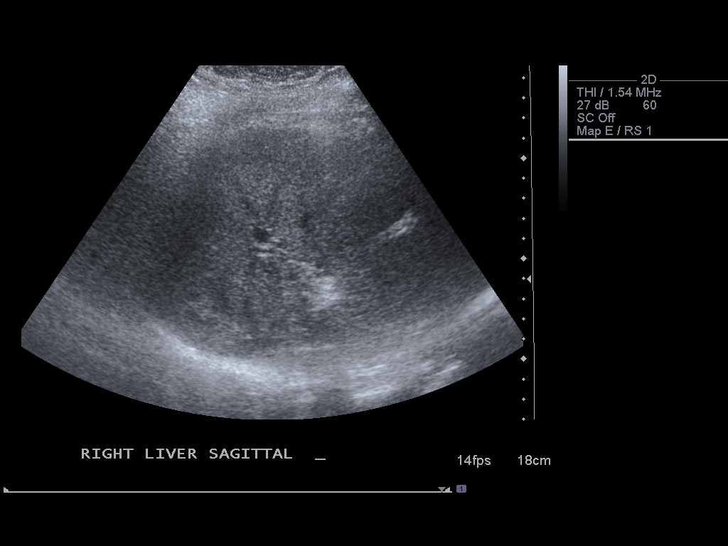
[im 14/53]
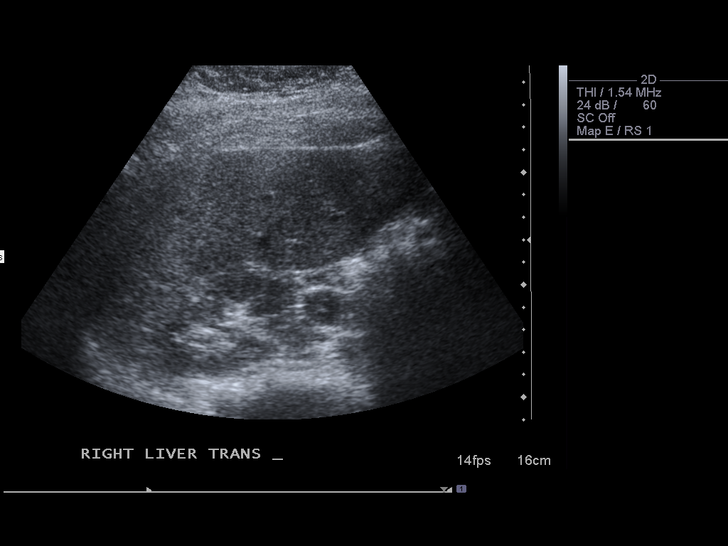
[im 18/53]
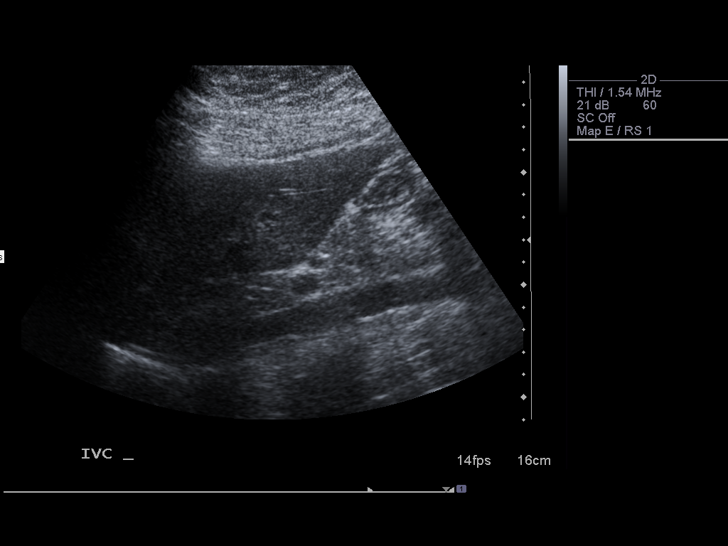
[im 20/53]
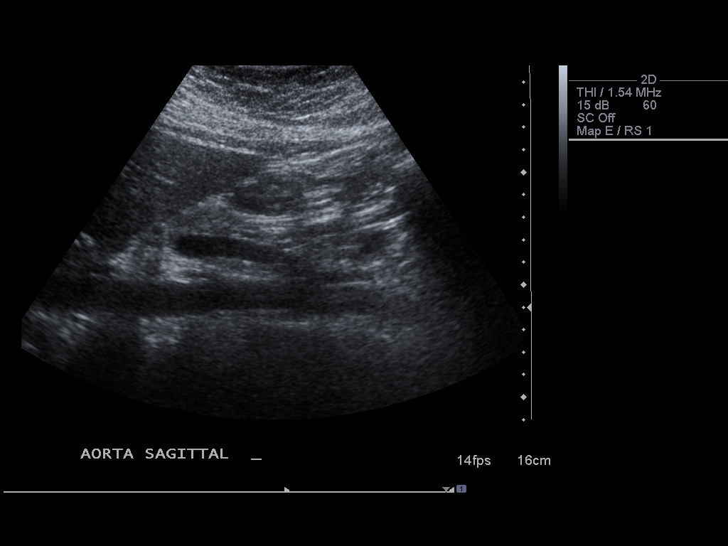
[im 24/53]
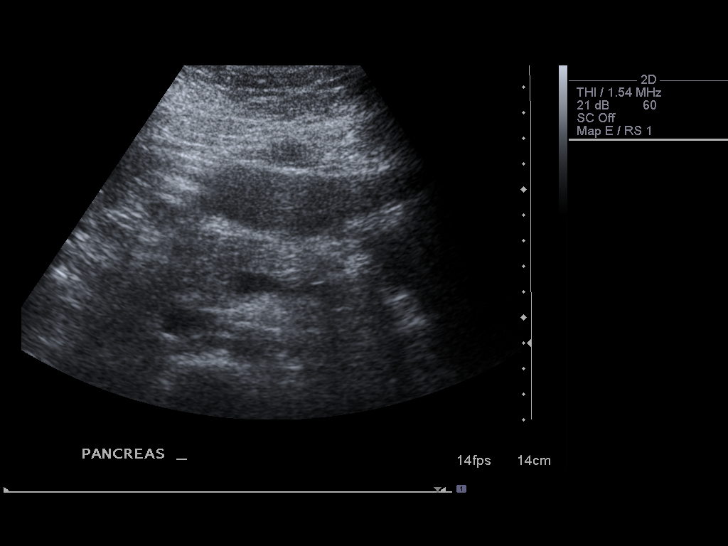
[im 29/53]
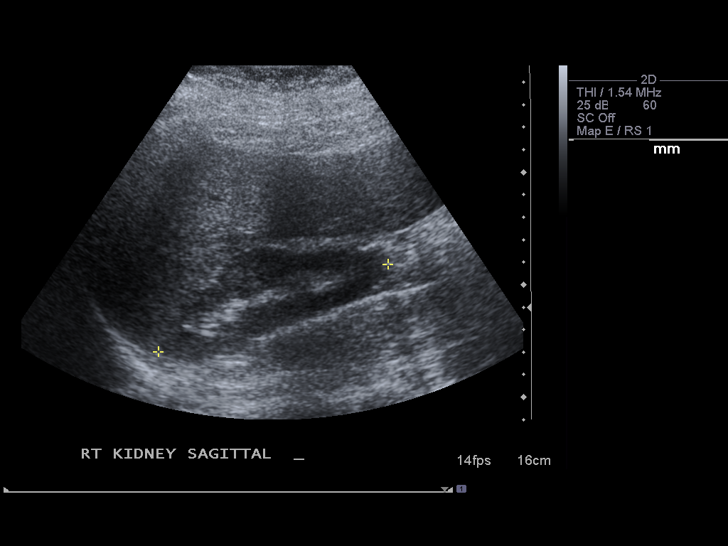
[im 33/53]
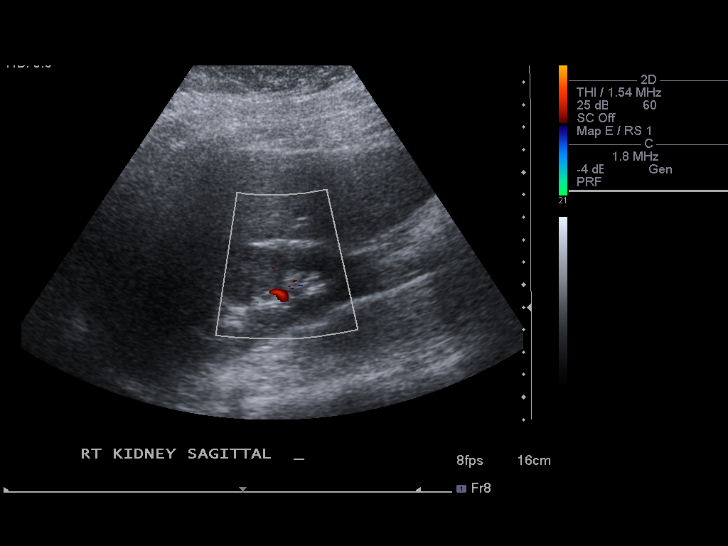
[im 35/53]
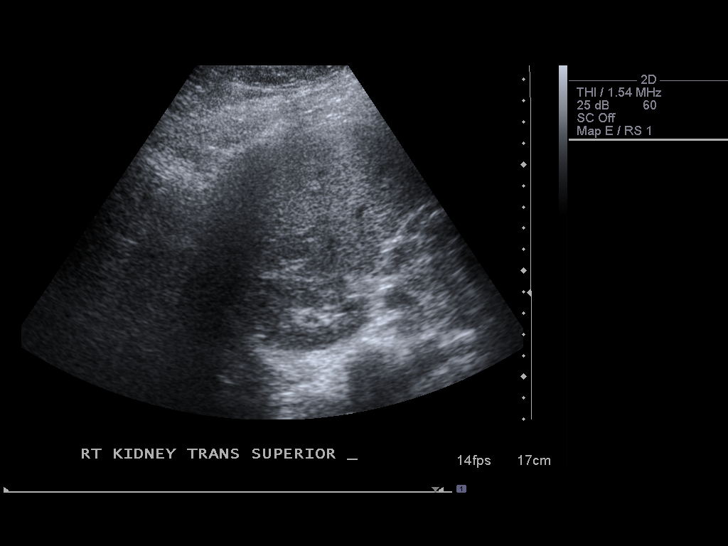
[im 40/53]
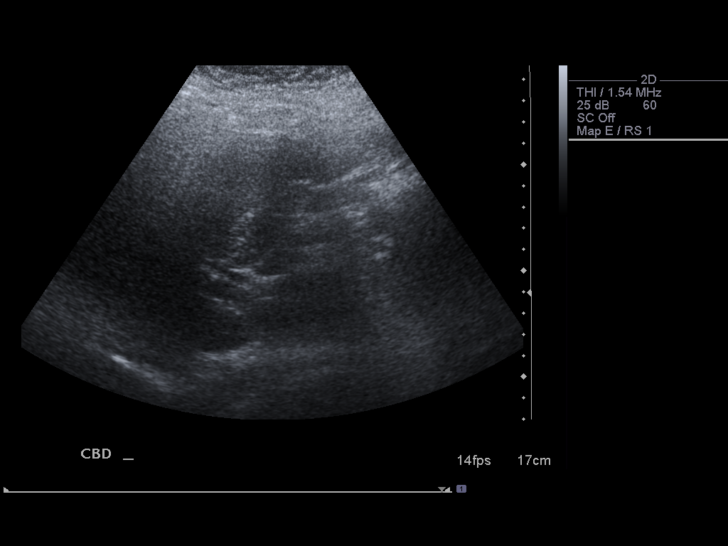
[im 44/53]
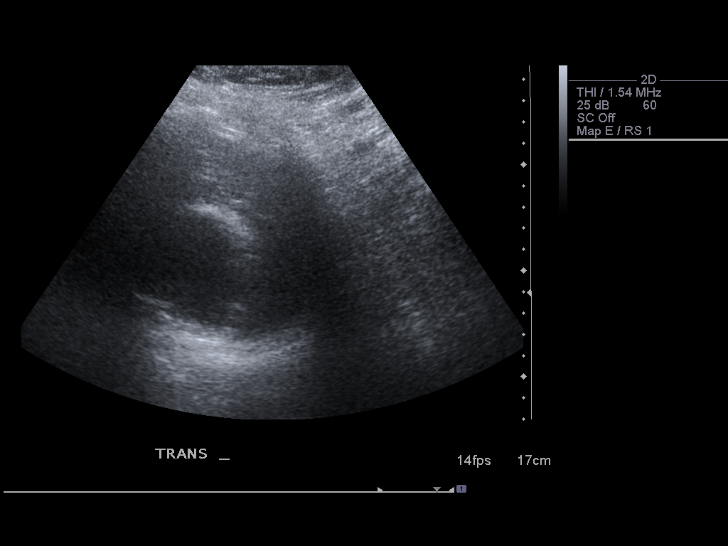
[im 48/53]
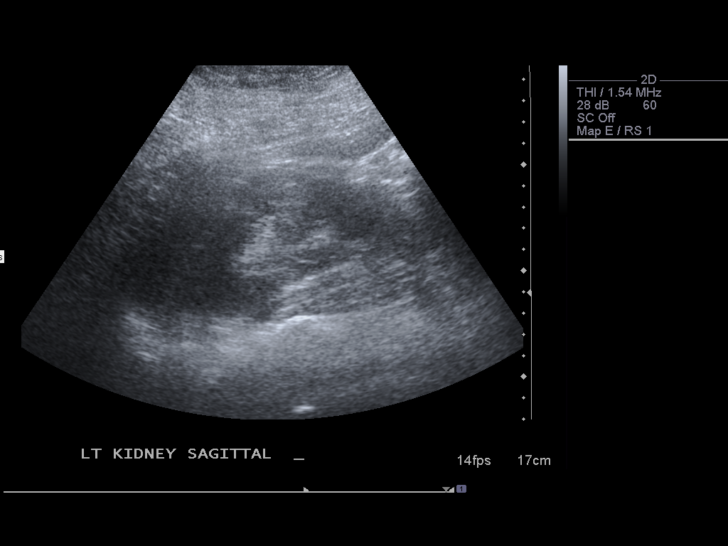
[im 53/53]
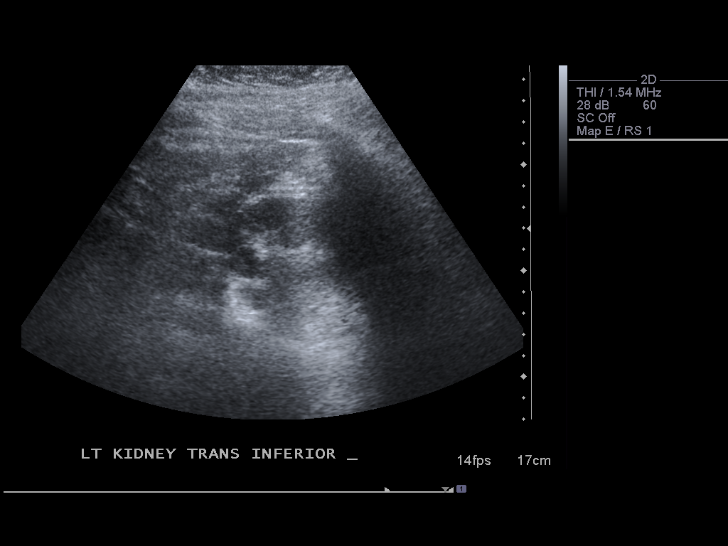

[14 of 25 positions shown; findings below may reference images not displayed]

FINDINGS: Gallbladder:  Surgically absent

Common Bile Duct:  Within normal limits in caliber.

Liver: Mildly heterogeneous echotexture, nonspecific.  No focal
abnormality or intrahepatic biliary dilatation.

IVC:  Appears normal.

Pancreas:  No abnormality identified.

Spleen:  Within normal limits in size and echotexture.

Right kidney:  Normal in size and parenchymal echogenicity.  No
evidence of mass or hydronephrosis.

Left kidney:  Normal in size and parenchymal echogenicity.  No
evidence of mass or hydronephrosis.

Abdominal Aorta:  No aneurysm identified.
IMPRESSION: Prior cholecystectomy.  No acute finding by ultrasound.

## 2011-08-19 MED ORDER — SODIUM CHLORIDE 0.9 % IV BOLUS (SEPSIS)
1000.0000 mL | Freq: Once | INTRAVENOUS | Status: AC
  Administered 2011-08-19: 1000 mL via INTRAVENOUS

## 2011-08-19 MED ORDER — PROMETHAZINE HCL 25 MG PO TABS: 25.0000 mg | ORAL_TABLET | Freq: Three times a day (TID) | ORAL | Status: DC | PRN

## 2011-08-19 MED ORDER — SODIUM CHLORIDE 0.9 % IV BOLUS (SEPSIS): 1000.0000 mL | Freq: Once | INTRAVENOUS | Status: DC

## 2011-08-19 MED ORDER — FENTANYL CITRATE 0.05 MG/ML IJ SOLN
100.0000 ug | Freq: Once | INTRAMUSCULAR | Status: AC
  Administered 2011-08-19: 100 ug via INTRAVENOUS
  Filled 2011-08-19: qty 2

## 2011-08-19 MED ORDER — MORPHINE SULFATE 4 MG/ML IJ SOLN
6.0000 mg | Freq: Once | INTRAMUSCULAR | Status: AC
  Administered 2011-08-19: 4 mg via INTRAVENOUS
  Filled 2011-08-19: qty 1

## 2011-08-19 MED ORDER — ONDANSETRON HCL 4 MG/2ML IJ SOLN
4.0000 mg | Freq: Once | INTRAMUSCULAR | Status: AC
  Administered 2011-08-19: 4 mg via INTRAVENOUS
  Filled 2011-08-19: qty 2

## 2011-08-19 NOTE — ED Notes (Signed)
Pt able to tolerate gingerale well. No vomiting. States it made her stomach 'ball up'.

## 2011-08-19 NOTE — Discharge Instructions (Signed)
Return here as needed. Follow up with your doctor for a recheck. Slowly increase your fluids.  

## 2011-08-19 NOTE — ED Notes (Addendum)
Reminded patient we need a urine sample.  Patient unable to urinate at this time.  Family member with patient.

## 2011-08-19 NOTE — ED Notes (Signed)
Patient with onset of n/v/d since yesterday.  She states she had onset of sx 2 hours after eating out

## 2011-08-19 NOTE — ED Provider Notes (Signed)
History     CSN: [id number]  Arrival date & time 08/19/11  4098   First MD Initiated Contact with Patient 08/19/11 1027      Chief Complaint  Patient presents with  . Emesis  . Diarrhea  . Abdominal Pain    (Consider location/radiation/quality/duration/timing/severity/associated sxs/prior treatment) HPI Patient presents emergency Dept. with nausea, vomiting, and diarrhea for 24 hours.  She states that she ate at a fast food restaurant with nausea and vomiting and pain that began 3 hours after eating.  Patient denies chest pain, shortness of breath, weakness, back pain, dysuria, fever, or headache.  She states she has not tried anything for her symptoms.  Past Medical History  Diagnosis Date  . Hypertension     Past Surgical History  Procedure Date  . Cholecystectomy   . Breast reduction surgery     No family history on file.  History  Substance Use Topics  . Smoking status: Never Smoker   . Smokeless tobacco: Not on file  . Alcohol Use: Yes    OB History    Grav Para Term Preterm Abortions TAB SAB Ect Mult Living                  Review of Systems All pertinent positives and negatives reviewed in the history of present illness  Allergies  Flagyl; Macrobid; Macrolides and ketolides; and Vicodin  Home Medications   Current Outpatient Rx  Name Route Sig Dispense Refill  . BENAZEPRIL HCL 20 MG PO TABS Oral Take 20 mg by mouth daily.    Marland Kitchen HYDROCHLOROTHIAZIDE 12.5 MG PO CAPS Oral Take 12.5 mg by mouth daily.      BP 151/82  Pulse 90  Temp(Src) 98.5 F (36.9 C) (Oral)  Resp 18  Ht 5\' 2"  (1.575 m)  Wt 226 lb (102.513 kg)  BMI 41.34 kg/m2  SpO2 98%  Physical Exam  Constitutional: She is oriented to person, place, and time. She appears well-developed and well-nourished.  HENT:  Head: Normocephalic and atraumatic.  Eyes: Pupils are equal, round, and reactive to light.  Neck: Normal range of motion. Neck supple.  Cardiovascular: Normal rate, regular  rhythm and normal heart sounds.  Exam reveals no gallop and no friction rub.   No murmur heard. Pulmonary/Chest: Effort normal and breath sounds normal. No respiratory distress. She has no wheezes. She has no rales.  Abdominal: Soft. Bowel sounds are normal. There is tenderness in the right upper quadrant. There is no rigidity, no rebound, no guarding and negative Murphy's sign. No hernia.    Neurological: She is alert and oriented to person, place, and time.  Skin: Skin is warm and dry. No rash noted.    ED Course  Procedures (including critical care time)  Labs Reviewed  CBC - Abnormal; Notable for the following:    RBC 5.20 (*)    All other components within normal limits  DIFFERENTIAL - Abnormal; Notable for the following:    Neutrophils Relative 81 (*)    Lymphocytes Relative 7 (*)    Lymphs Abs 0.6 (*)    All other components within normal limits  COMPREHENSIVE METABOLIC PANEL - Abnormal; Notable for the following:    Potassium 3.2 (*)    Glucose, Bld 129 (*)    All other components within normal limits  LIPASE, BLOOD  URINALYSIS, ROUTINE W REFLEX MICROSCOPIC  PREGNANCY, URINE   US Abdomen Complete  08/19/2011  *RADIOLOGY REPORT*  Clinical Data:  Abdominal pain, history of prior cholecystectomy  ABDOMINAL ULTRASOUND COMPLETE  Comparison:  None.  Findings:  Gallbladder:  Surgically absent  Common Bile Duct:  Within normal limits in caliber.  Liver: Mildly heterogeneous echotexture, nonspecific.  No focal abnormality or intrahepatic biliary dilatation.  IVC:  Appears normal.  Pancreas:  No abnormality identified.  Spleen:  Within normal limits in size and echotexture.  Right kidney:  Normal in size and parenchymal echogenicity.  No evidence of mass or hydronephrosis.  Left kidney:  Normal in size and parenchymal echogenicity.  No evidence of mass or hydronephrosis.  Abdominal Aorta:  No aneurysm identified.  IMPRESSION: Prior cholecystectomy.  No acute finding by ultrasound.   Original Report Authenticated By: Judie Petit. Ruel Favors, M.D.     Patient most likely has viral gastroenteritis based on her history of present illness and physical exam findings, along with her lab testing.  The patient has tolerated oral fluids.  She is advised to return here as needed.  Told to follow up with her primary care doctor for recheck.   MDM  MDM Reviewed: nursing note and vitals Interpretation: labs and ultrasound            Carlyle Dolly, PA-C 08/19/11 1509

## 2011-08-21 NOTE — ED Provider Notes (Signed)
Medical screening examination/treatment/procedure(s) were performed by non-physician practitioner and as supervising physician I was immediately available for consultation/collaboration.   Suzi Roots, MD 08/21/11 [phone]

## 2011-10-12 ENCOUNTER — Encounter (HOSPITAL_COMMUNITY): Payer: Self-pay | Admitting: *Deleted

## 2011-10-12 ENCOUNTER — Emergency Department (HOSPITAL_COMMUNITY)
Admission: EM | Admit: 2011-10-12 | Discharge: 2011-10-13 | Disposition: A | Discharge status: Home / Self Care | Payer: Medicaid Other | Attending: Emergency Medicine | Admitting: Emergency Medicine

## 2011-10-12 ENCOUNTER — Emergency Department (HOSPITAL_COMMUNITY): Payer: Medicaid Other

## 2011-10-12 DIAGNOSIS — I10 Essential (primary) hypertension: Secondary | ICD-10-CM | POA: Insufficient documentation

## 2011-10-12 DIAGNOSIS — R0789 Other chest pain: Secondary | ICD-10-CM

## 2011-10-12 DIAGNOSIS — R071 Chest pain on breathing: Secondary | ICD-10-CM | POA: Insufficient documentation

## 2011-10-12 DIAGNOSIS — R109 Unspecified abdominal pain: Secondary | ICD-10-CM | POA: Insufficient documentation

## 2011-10-12 LAB — DIFFERENTIAL
Basophils Absolute: 0 10*3/uL (ref 0.0–0.1)
Basophils Relative: 0 % (ref 0–1)
Eosinophils Relative: 2 % (ref 0–5)
Lymphocytes Relative: 38 % (ref 12–46)
Monocytes Absolute: 0.9 10*3/uL (ref 0.1–1.0)
Monocytes Relative: 9 % (ref 3–12)

## 2011-10-12 LAB — URINALYSIS, ROUTINE W REFLEX MICROSCOPIC
Bilirubin Urine: NEGATIVE
Hgb urine dipstick: NEGATIVE
Protein, ur: NEGATIVE mg/dL
Urobilinogen, UA: 1 mg/dL (ref 0.0–1.0)

## 2011-10-12 LAB — CBC
HCT: 35.5 % — ABNORMAL LOW (ref 36.0–46.0)
Hemoglobin: 12.2 g/dL (ref 12.0–15.0)
MCHC: 34.4 g/dL (ref 30.0–36.0)
MCV: 80.3 fL (ref 78.0–100.0)
RDW: 13.6 % (ref 11.5–15.5)

## 2011-10-12 LAB — BASIC METABOLIC PANEL
BUN: 11 mg/dL (ref 6–23)
CO2: 27 mEq/L (ref 19–32)
Calcium: 9.1 mg/dL (ref 8.4–10.5)
Creatinine, Ser: 0.78 mg/dL (ref 0.50–1.10)

## 2011-10-12 LAB — D-DIMER, QUANTITATIVE: D-Dimer, Quant: <0.22 ug/mL-FEU (ref 0.00–0.48)

## 2011-10-12 LAB — URINE MICROSCOPIC-ADD ON

## 2011-10-12 IMAGING — CR DG CHEST 2V
2 series · 2 of 2 positions shown · non-contrast
Comparison: Chest x-ray 07/01/2011.

CLINICAL DATA: Right-sided flank pain.

CHEST - 2 VIEW

[w chest pa]
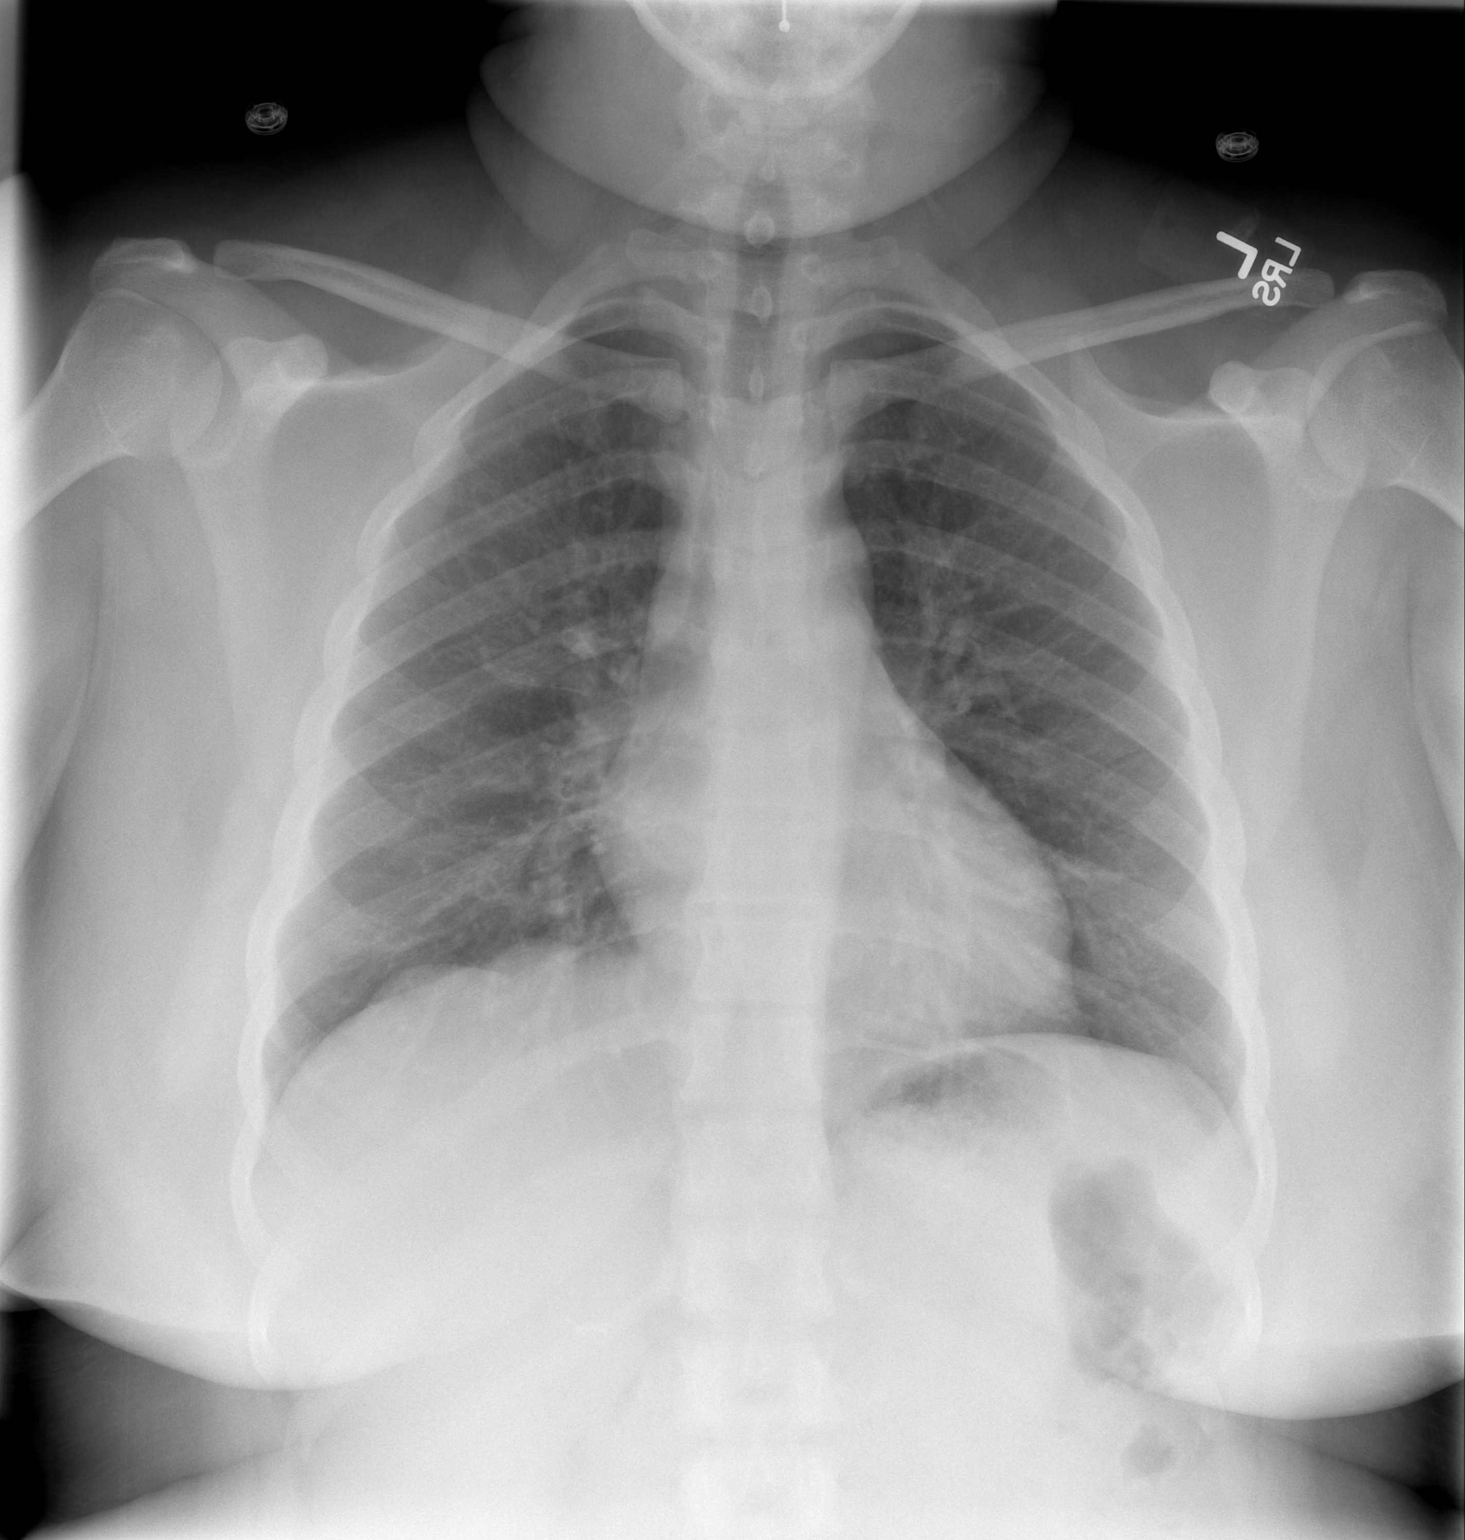

[w chest lat]
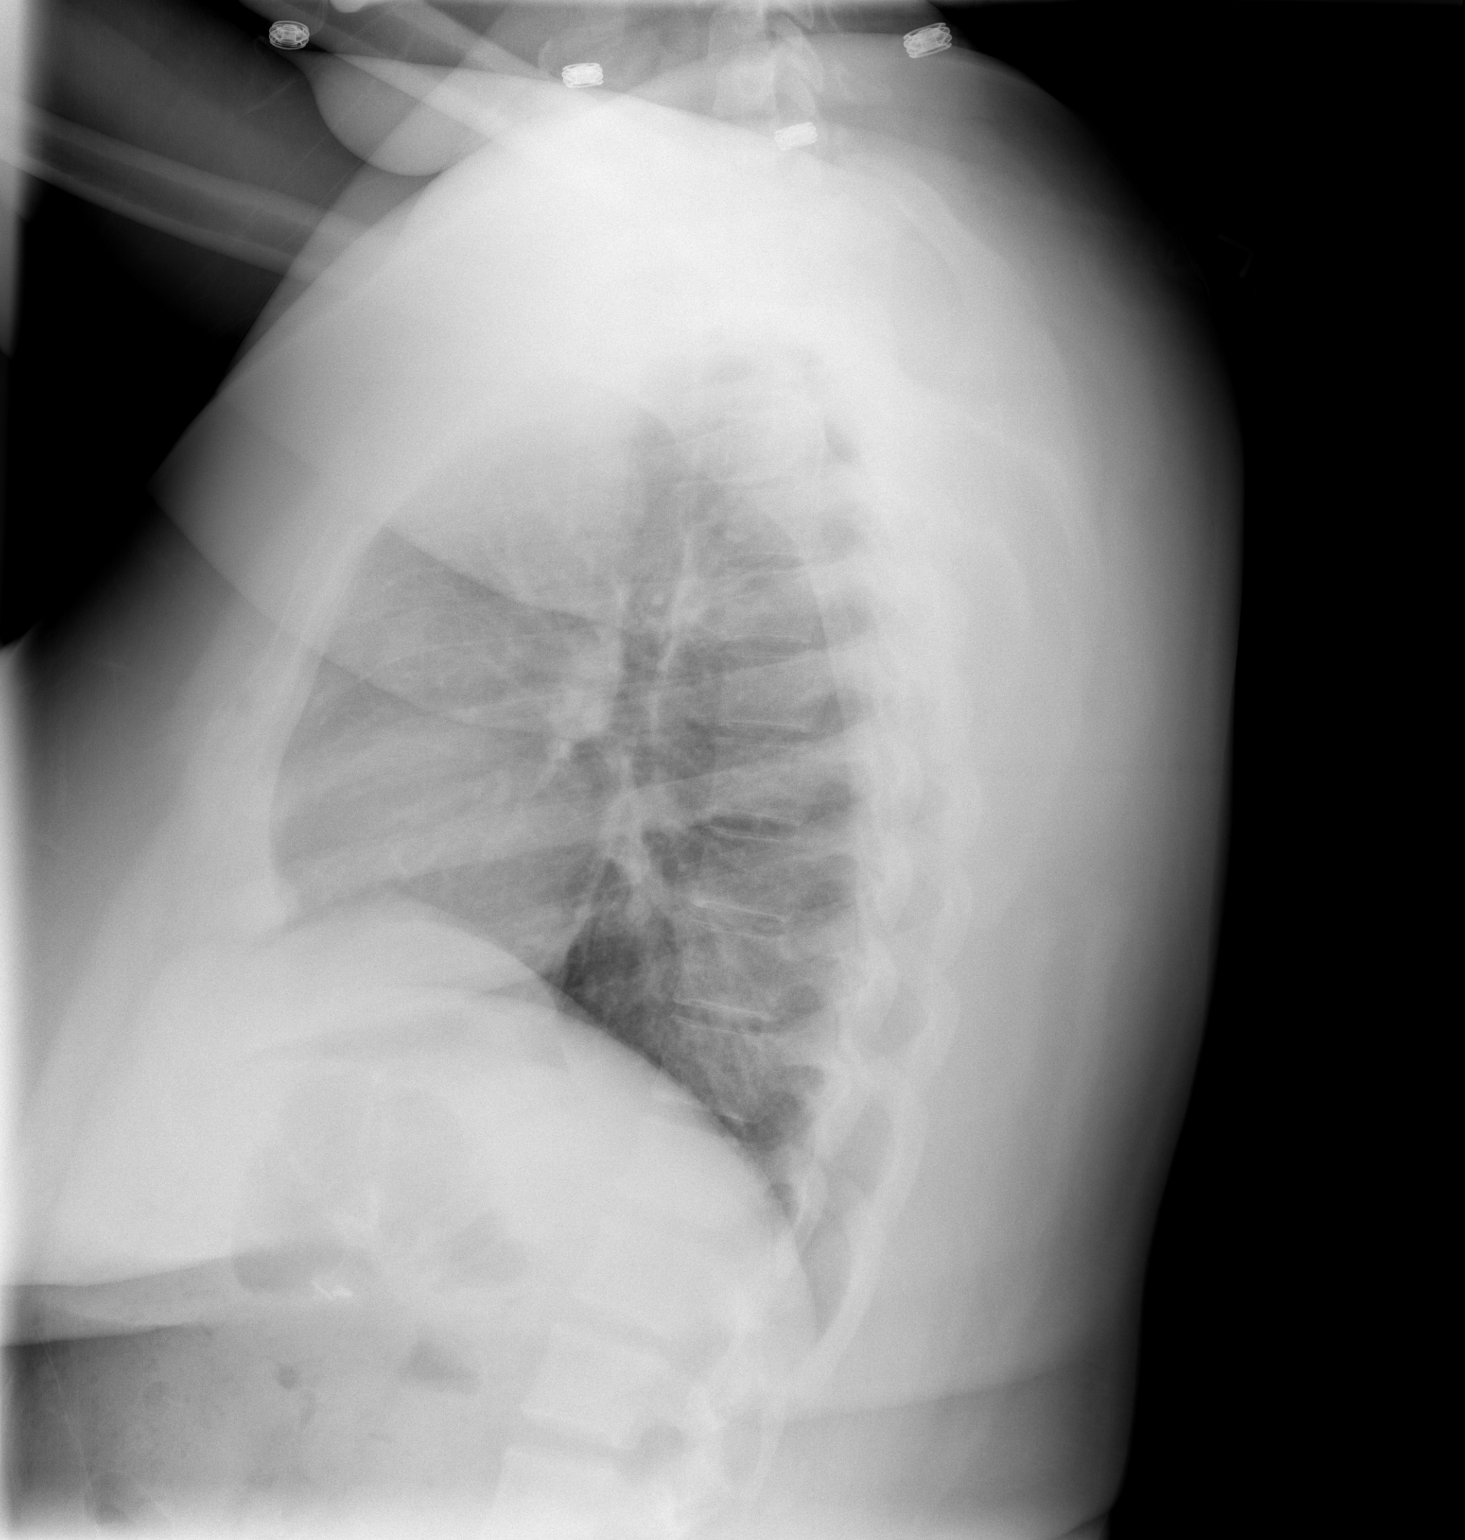

[2 of 2 positions shown; findings below may reference images not displayed]

FINDINGS: Lung volumes are normal.  No consolidative airspace
disease.  No pleural effusions.  No pneumothorax.  No pulmonary
nodule or mass noted.  Pulmonary vasculature and the
cardiomediastinal silhouette are within normal limits.
IMPRESSION: 1. No radiographic evidence of acute cardiopulmonary disease.

## 2011-10-12 MED ORDER — HYDROMORPHONE HCL PF 1 MG/ML IJ SOLN
1.0000 mg | Freq: Once | INTRAMUSCULAR | Status: AC
  Administered 2011-10-12: 1 mg via INTRAVENOUS
  Filled 2011-10-12: qty 1

## 2011-10-12 MED ORDER — KETOROLAC TROMETHAMINE 30 MG/ML IJ SOLN
30.0000 mg | Freq: Once | INTRAMUSCULAR | Status: AC
  Administered 2011-10-12: 30 mg via INTRAVENOUS
  Filled 2011-10-12: qty 1

## 2011-10-12 MED ORDER — ONDANSETRON HCL 4 MG/2ML IJ SOLN
4.0000 mg | Freq: Once | INTRAMUSCULAR | Status: AC
  Administered 2011-10-12: 4 mg via INTRAVENOUS
  Filled 2011-10-12: qty 2

## 2011-10-12 MED ORDER — DIPHENHYDRAMINE HCL 50 MG/ML IJ SOLN
25.0000 mg | Freq: Once | INTRAMUSCULAR | Status: AC
  Administered 2011-10-12: 25 mg via INTRAVENOUS
  Filled 2011-10-12: qty 1

## 2011-10-12 NOTE — ED Notes (Signed)
She hashad back pain for one years and she woke up this am with the pain and  It moves to her chest and she has sob.  None now

## 2011-10-12 NOTE — ED Provider Notes (Signed)
History     CSN: [id number]  Arrival date & time 10/12/11  2050   First MD Initiated Contact with Patient 10/12/11 2127      Chief Complaint  Patient presents with  . Flank Pain    (Consider location/radiation/quality/duration/timing/severity/associated sxs/prior treatment) Patient is a 31 y.o. female presenting with flank pain. The history is provided by the patient.  Flank Pain  She had onset this morning of a sharp, severe pain in the left flank area which radiates around to the left chest. Pain is worse with movement and worse with breathing and worse with palpation. Pain is slightly better if she lays very still. She is not sure she is having shortness of breath or she is just afraid to take a deep breath because it hurts. She denies fever, chills, sweats. Denies nausea, vomiting, diarrhea. She denies urinary urgency, frequency, and tenesmus, dysuria. She's had urinary tract infections in the past and they have felt similar to this except the pain is back onto her chest. She denies any trauma denies any unusual bending or lifting or twisting.  Past Medical History  Diagnosis Date  . Hypertension     Past Surgical History  Procedure Date  . Cholecystectomy   . Breast reduction surgery     No family history on file.  History  Substance Use Topics  . Smoking status: Never Smoker   . Smokeless tobacco: Not on file  . Alcohol Use: Yes    OB History    Grav Para Term Preterm Abortions TAB SAB Ect Mult Living                  Review of Systems  Genitourinary: Positive for flank pain.  All other systems reviewed and are negative.    Allergies  Fish allergy; Flagyl; Macrolides and ketolides; Nitrofurantoin monohyd macro; and Vicodin  Home Medications   Current Outpatient Rx  Name Route Sig Dispense Refill  . BENAZEPRIL-HYDROCHLOROTHIAZIDE 10-12.5 MG PO TABS Oral Take 1 tablet by mouth daily.    Marland Kitchen HYDROCHLOROTHIAZIDE 12.5 MG PO CAPS Oral Take 12.5 mg by mouth  daily.    . OXYCODONE-ACETAMINOPHEN 10-325 MG PO TABS Oral Take 1 tablet by mouth every 4 (four) hours as needed. For pain    . PHENTERMINE HCL 37.5 MG PO CAPS Oral Take 37.5 mg by mouth every morning.      BP 144/86  Pulse 103  Temp(Src) 97.9 F (36.6 C) (Oral)  Resp 18  SpO2 98%  LMP 09/11/2011  Physical Exam  Nursing note and vitals reviewed.  31 year old female who is obese and appears to be in pain. Vital signs are significant for mild tachycardia with heart rate of 130, and borderline hypertension with blood pressure 144/86. Oxygen saturation is 98% which is normal. Head is normocephalic and atraumatic. PERRLA, EOMI para thinks is clear. Neck is nontender and supple without adenopathy. Back has no midline tenderness. There is moderate left CVA tenderness and tenderness of the left posterior inferior rib cage. Rib cage tenderness extends across to the inferior half of the left rib cage anteriorly. Lungs are clear without rales, wheezes, or rhonchi. Heart has regular rate and rhythm without murmur or rub. Abdomen is obese, soft, nontender without masses or hepatosplenomegaly. Pelvis is stable and nontender. Extremities have no cyanosis or edema, full range of motion is present. Skin is warm and dry without rash. Specifically, there is no evidence of rash of herpes zoster in the distribution where she is having her pain.  Neurologic: Mental status is normal, cranial nerves are intact, there are no motor or sensory deficits.  ED Course  Procedures (including critical care time)  Results for orders placed during the hospital encounter of 10/12/11  URINALYSIS, ROUTINE W REFLEX MICROSCOPIC      Component Value Range   Color, Urine YELLOW  YELLOW    APPearance CLOUDY (*) CLEAR    Specific Gravity, Urine 1.029  1.005 - 1.030    pH 6.0  5.0 - 8.0    Glucose, UA NEGATIVE  NEGATIVE (mg/dL)   Hgb urine dipstick NEGATIVE  NEGATIVE    Bilirubin Urine NEGATIVE  NEGATIVE    Ketones, ur NEGATIVE   NEGATIVE (mg/dL)   Protein, ur NEGATIVE  NEGATIVE (mg/dL)   Urobilinogen, UA 1.0  0.0 - 1.0 (mg/dL)   Nitrite NEGATIVE  NEGATIVE    Leukocytes, UA SMALL (*) NEGATIVE   PREGNANCY, URINE      Component Value Range   Preg Test, Ur NEGATIVE  NEGATIVE   CBC      Component Value Range   WBC 9.1  4.0 - 10.5 (K/uL)   RBC 4.42  3.87 - 5.11 (MIL/uL)   Hemoglobin 12.2  12.0 - 15.0 (g/dL)   HCT 16.1 (*) 09.6 - 46.0 (%)   MCV 80.3  78.0 - 100.0 (fL)   MCH 27.6  26.0 - 34.0 (pg)   MCHC 34.4  30.0 - 36.0 (g/dL)   RDW 04.5  40.9 - 81.1 (%)   Platelets 302  150 - 400 (K/uL)  DIFFERENTIAL      Component Value Range   Neutrophils Relative 50  43 - 77 (%)   Neutro Abs 4.5  1.7 - 7.7 (K/uL)   Lymphocytes Relative 38  12 - 46 (%)   Lymphs Abs 3.5  0.7 - 4.0 (K/uL)   Monocytes Relative 9  3 - 12 (%)   Monocytes Absolute 0.9  0.1 - 1.0 (K/uL)   Eosinophils Relative 2  0 - 5 (%)   Eosinophils Absolute 0.2  0.0 - 0.7 (K/uL)   Basophils Relative 0  0 - 1 (%)   Basophils Absolute 0.0  0.0 - 0.1 (K/uL)  BASIC METABOLIC PANEL      Component Value Range   Sodium 141  135 - 145 (mEq/L)   Potassium 3.4 (*) 3.5 - 5.1 (mEq/L)   Chloride 104  96 - 112 (mEq/L)   CO2 27  19 - 32 (mEq/L)   Glucose, Bld 141 (*) 70 - 99 (mg/dL)   BUN 11  6 - 23 (mg/dL)   Creatinine, Ser 9.14  0.50 - 1.10 (mg/dL)   Calcium 9.1  8.4 - 78.2 (mg/dL)   GFR calc non Af Amer >90  >90 (mL/min)   GFR calc Af Amer >90  >90 (mL/min)  D-DIMER, QUANTITATIVE      Component Value Range   D-Dimer, Quant <0.22  0.00 - 0.48 (ug/mL-FEU)  URINE MICROSCOPIC-ADD ON      Component Value Range   Squamous Epithelial / LPF FEW (*) RARE    WBC, UA 0-2  <3 (WBC/hpf)   Bacteria, UA RARE  RARE    Dg Chest 2 View  10/12/2011  *RADIOLOGY REPORT*  Clinical Data: Right-sided flank pain.  CHEST - 2 VIEW  Comparison: Chest x-ray 07/01/2011.  Findings: Lung volumes are normal.  No consolidative airspace disease.  No pleural effusions.  No pneumothorax.  No  pulmonary nodule or mass noted.  Pulmonary vasculature and the cardiomediastinal silhouette are  within normal limits.  IMPRESSION: 1. No radiographic evidence of acute cardiopulmonary disease.  Original Report Authenticated By: Florencia Reasons, M.D.       1. Chest wall pain       MDM  Pleuritic left-sided chest pain which appears to be a chest wall pain. Chest x-ray will be obtained to rule out pneumonia. As her presentation is somewhat atypical, d-dimer will be obtained to rule out pulmonary embolism. She is given hydromorphone for pain.  She did get good relief of pain with hydromorphone, but required 2 doses. It is noted that she had been on Percocet 10 at home so she may have developed relative tolerance to narcotics. Workup is negative for serious causes of pain. I am still suspicious that she may have herpes zoster and this is explained to the patient and she is instructed to return to the emergency Department or see her primary care doctor she does develop a rash. She will be treated symptomatically with naproxen and Percocet.      Dione Booze, MD 10/13/11 [id number]

## 2011-10-13 MED ORDER — OXYCODONE-ACETAMINOPHEN 10-325 MG PO TABS: 1.0000 | ORAL_TABLET | ORAL | Status: AC | PRN

## 2011-10-13 MED ORDER — NAPROXEN 500 MG PO TABS: 500.0000 mg | ORAL_TABLET | Freq: Two times a day (BID) | ORAL | Status: DC

## 2011-10-13 NOTE — Discharge Instructions (Signed)
Take naproxen twice a day, every day. Take Percocet as needed for pain control. Watch carefully to see if you break out in a rash where you're having your pain. If you do break out in a rash there, see your doctor or come to the emergency department to get started on medicine to treat shingles. I am including information on shingles. Sometimes the pain from shingles comes out before the rash develops. If you do have shingles, you should be on a medicine that is very specific for shingles.  Chest Wall Pain Chest wall pain is pain in or around the bones and muscles of your chest. It may take up to 6 weeks to get better. It may take longer if you must stay physically active in your work and activities.  CAUSES  Chest wall pain may happen on its own. However, it may be caused by:  A viral illness like the flu.   Injury.   Coughing.   Exercise.   Arthritis.   Fibromyalgia.   Shingles.  HOME CARE INSTRUCTIONS   Avoid overtiring physical activity. Try not to strain or perform activities that cause pain. This includes any activities using your chest or your abdominal and side muscles, especially if heavy weights are used.   Put ice on the sore area.   Put ice in a plastic bag.   Place a towel between your skin and the bag.   Leave the ice on for 15 to 20 minutes per hour while awake for the first 2 days.   Only take over-the-counter or prescription medicines for pain, discomfort, or fever as directed by your caregiver.  SEEK IMMEDIATE MEDICAL CARE IF:   Your pain increases, or you are very uncomfortable.   You have a fever.   Your chest pain becomes worse.   You have new, unexplained symptoms.   You have nausea or vomiting.   You feel sweaty or lightheaded.   You have a cough with phlegm (sputum), or you cough up blood.  MAKE SURE YOU:   Understand these instructions.   Will watch your condition.   Will get help right away if you are not doing well or get worse.    Document Released: 04/28/2005 Document Revised: 04/17/2011 Document Reviewed: 12/23/2010 [facility] Patient Information 2012 [place], [place].  Shingles Shingles is caused by the same virus that causes chickenpox (varicella zoster virus or VZV). Shingles often occurs many years or decades after having chickenpox. That is why it is more common in adults older than 50 years. The virus reactivates and breaks out as an infection in a nerve root. SYMPTOMS   The initial feeling (sensations) may be pain. This pain is usually described as:   Burning.   Stabbing.   Throbbing.   Tingling in the nerve root.   A red rash will follow in a couple days. The rash may occur in any area of the body and is usually on one side (unilateral) of the body in a band or belt-like pattern. The rash usually starts out as very small blisters (vesicles). They will dry up after 7 to 10 days. This is not usually a significant problem except for the pain it causes.   Long-lasting (chronic) pain is more likely in an elderly person. It can last months to years. This condition is called postherpetic neuralgia.  Shingles can be an extremely severe infection in someone with AIDS, a weakened immune system, or with forms of leukemia. It can also be severe if you are  taking transplant medicines or other medicines that weaken the immune system. TREATMENT  Your caregiver will often treat you with:  Antiviral drugs.   Anti-inflammatory drugs.   Pain medicines.  Bed rest is very important in preventing the pain associated with herpes zoster (postherpetic neuralgia). Application of heat in the form of a hot water bottle or electric heating pad or gentle pressure with the hand is recommended to help with the pain or discomfort. PREVENTION  A varicella zoster vaccine is available to help protect against the virus. The Food and Drug Administration approved the varicella zoster vaccine for individuals 59 years of age and older. HOME  CARE INSTRUCTIONS   Cool compresses to the area of rash may be helpful.   Only take over-the-counter or prescription medicines for pain, discomfort, or fever as directed by your caregiver.   Avoid contact with:   Babies.   Pregnant women.   Children with eczema.   Elderly people with transplants.   People with chronic illnesses, such as leukemia and AIDS.   If the area involved is on your face, you may receive a referral for follow-up to a specialist. It is very important to keep all follow-up appointments. This will help avoid eye complications, chronic pain, or disability.  SEEK IMMEDIATE MEDICAL CARE IF:   You develop any pain (headache) in the area of the face or eye. This must be followed carefully by your caregiver or ophthalmologist. An infection in part of your eye (cornea) can be very serious. It could lead to blindness.   You do not have pain relief from prescribed medicines.   Your redness or swelling spreads.   The area involved becomes very swollen and painful.   You have a fever.   You notice any red or painful lines extending away from the affected area toward your heart (lymphangitis).   Your condition is worsening or has changed.  Document Released: 04/28/2005 Document Revised: 04/17/2011 Document Reviewed: 04/02/2009 [facility] Patient Information 2012 [place], [place].  Naproxen and naproxen sodium oral immediate-release tablets What is this medicine? NAPROXEN (na PROX en) is a non-steroidal anti-inflammatory drug (NSAID). It is used to reduce swelling and to treat pain. This medicine may be used for dental pain, headache, or painful monthly periods. It is also used for painful joint and muscular problems such as arthritis, tendinitis, bursitis, and gout. This medicine may be used for other purposes; ask your health care provider or pharmacist if you have questions. What should I tell my health care provider before I take this medicine? They need to know if  you have any of these conditions: -asthma -cigarette smoker -drink more than 3 alcohol containing drinks a day -heart disease or circulation problems such as heart failure or leg edema (fluid retention) -high blood pressure -kidney disease -liver disease -stomach bleeding or ulcers -an unusual or allergic reaction to naproxen, aspirin, other NSAIDs, other medicines, foods, dyes, or preservatives -pregnant or trying to get pregnant -breast-feeding How should I use this medicine? Take this medicine by mouth with a glass of water. Follow the directions on the prescription label. Take it with food if your stomach gets upset. Try to not lie down for at least 10 minutes after you take it. Take your medicine at regular intervals. Do not take your medicine more often than directed. Long-term, continuous use may increase the risk of heart attack or stroke. A special MedGuide will be given to you by the pharmacist with each prescription and refill. Be sure to  read this information carefully each time. Talk to your pediatrician regarding the use of this medicine in children. Special care may be needed. Overdosage: If you think you have taken too much of this medicine contact a poison control center or emergency room at once. NOTE: This medicine is only for you. Do not share this medicine with others. What if I miss a dose? If you miss a dose, take it as soon as you can. If it is almost time for your next dose, take only that dose. Do not take double or extra doses. What may interact with this medicine? -alcohol -aspirin -cidofovir -diuretics -lithium -methotrexate -other drugs for inflammation like ketorolac or prednisone -pemetrexed -probenecid -warfarin This list may not describe all possible interactions. Give your health care provider a list of all the medicines, herbs, non-prescription drugs, or dietary supplements you use. Also tell them if you smoke, drink alcohol, or use illegal drugs.  Some items may interact with your medicine. What should I watch for while using this medicine? Tell your doctor or health care professional if your pain does not get better. Talk to your doctor before taking another medicine for pain. Do not treat yourself. This medicine does not prevent heart attack or stroke. In fact, this medicine may increase the chance of a heart attack or stroke. The chance may increase with longer use of this medicine and in people who have heart disease. If you take aspirin to prevent heart attack or stroke, talk with your doctor or health care professional. Do not take other medicines that contain aspirin, ibuprofen, or naproxen with this medicine. Side effects such as stomach upset, nausea, or ulcers may be more likely to occur. Many medicines available without a prescription should not be taken with this medicine. This medicine can cause ulcers and bleeding in the stomach and intestines at any time during treatment. Do not smoke cigarettes or drink alcohol. These increase irritation to your stomach and can make it more susceptible to damage from this medicine. Ulcers and bleeding can happen without warning symptoms and can cause death. You may get drowsy or dizzy. Do not drive, use machinery, or do anything that needs mental alertness until you know how this medicine affects you. Do not stand or sit up quickly, especially if you are an older patient. This reduces the risk of dizzy or fainting spells. This medicine can cause you to bleed more easily. Try to avoid damage to your teeth and gums when you brush or floss your teeth. What side effects may I notice from receiving this medicine? Side effects that you should report to your doctor or health care professional as soon as possible: -black or bloody stools, blood in the urine or vomit -blurred vision -chest pain -difficulty breathing or wheezing -nausea or vomiting -severe stomach pain -skin rash, skin redness,  blistering or peeling skin, hives, or itching -slurred speech or weakness on one side of the body -swelling of eyelids, throat, lips -unexplained weight gain or swelling -unusually weak or tired -yellowing of eyes or skin Side effects that usually do not require medical attention (report to your doctor or health care professional if they continue or are bothersome): -constipation -headache -heartburn This list may not describe all possible side effects. Call your doctor for medical advice about side effects. You may report side effects to FDA at 1-800-FDA-1088. Where should I keep my medicine? Keep out of the reach of children. Store at room temperature between 15 and 30 degrees C (59  and 86 degrees F). Keep container tightly closed. Throw away any unused medicine after the expiration date. NOTE: This sheet is a summary. It may not cover all possible information. If you have questions about this medicine, talk to your doctor, pharmacist, or health care provider.  2012, [employer]. (04/30/2009 8:10:16 PM)  Acetaminophen; Oxycodone tablets What is this medicine? ACETAMINOPHEN; OXYCODONE (a set a MEE noe fen; ox i KOE done) is a pain reliever. It is used to treat mild to moderate pain. This medicine may be used for other purposes; ask your health care provider or pharmacist if you have questions. What should I tell my health care provider before I take this medicine? They need to know if you have any of these conditions: -brain tumor -Crohn's disease, inflammatory bowel disease, or ulcerative colitis -drink more than 3 alcohol containing drinks per day -drug abuse or addiction -head injury -heart or circulation problems -kidney disease or problems going to the bathroom -liver disease -lung disease, asthma, or breathing problems -an unusual or allergic reaction to acetaminophen, oxycodone, other opioid analgesics, other medicines, foods, dyes, or preservatives -pregnant or  trying to get pregnant -breast-feeding How should I use this medicine? Take this medicine by mouth with a full glass of water. Follow the directions on the prescription label. Take your medicine at regular intervals. Do not take your medicine more often than directed. Talk to your pediatrician regarding the use of this medicine in children. Special care may be needed. Patients over 52 years old may have a stronger reaction and need a smaller dose. Overdosage: If you think you have taken too much of this medicine contact a poison control center or emergency room at once. NOTE: This medicine is only for you. Do not share this medicine with others. What if I miss a dose? If you miss a dose, take it as soon as you can. If it is almost time for your next dose, take only that dose. Do not take double or extra doses. What may interact with this medicine? -alcohol or medicines that contain alcohol -antihistamines -barbiturates like amobarbital, butalbital, butabarbital, methohexital, pentobarbital, phenobarbital, thiopental, and secobarbital -benztropine -drugs for bladder problems like solifenacin, trospium, oxybutynin, tolterodine, hyoscyamine, and methscopolamine -drugs for breathing problems like ipratropium and tiotropium -drugs for certain stomach or intestine problems like propantheline, homatropine methylbromide, glycopyrrolate, atropine, belladonna, and dicyclomine -general anesthetics like etomidate, ketamine, nitrous oxide, propofol, desflurane, enflurane, halothane, isoflurane, and sevoflurane -medicines for depression, anxiety, or psychotic disturbances -medicines for pain like codeine, morphine, pentazocine, buprenorphine, butorphanol, nalbuphine, tramadol, and propoxyphene -medicines for sleep -muscle relaxants -naltrexone -phenothiazines like perphenazine, thioridazine, chlorpromazine, mesoridazine, fluphenazine, prochlorperazine, promazine, and  trifluoperazine -scopolamine -trihexyphenidyl This list may not describe all possible interactions. Give your health care provider a list of all the medicines, herbs, non-prescription drugs, or dietary supplements you use. Also tell them if you smoke, drink alcohol, or use illegal drugs. Some items may interact with your medicine. What should I watch for while using this medicine? Tell your doctor or health care professional if your pain does not go away, if it gets worse, or if you have new or a different type of pain. You may develop tolerance to the medicine. Tolerance means that you will need a higher dose of the medication for pain relief. Tolerance is normal and is expected if you take this medicine for a long time. Do not suddenly stop taking your medicine because you may develop a severe reaction. Your body becomes used to the  medicine. This does NOT mean you are addicted. Addiction is a behavior related to getting and using a drug for a nonmedical reason. If you have pain, you have a medical reason to take pain medicine. Your doctor will tell you how much medicine to take. If your doctor wants you to stop the medicine, the dose will be slowly lowered over time to avoid any side effects. You may get drowsy or dizzy. Do not drive, use machinery, or do anything that needs mental alertness until you know how this medicine affects you. Do not stand or sit up quickly, especially if you are an older patient. This reduces the risk of dizzy or fainting spells. Alcohol may interfere with the effect of this medicine. Avoid alcoholic drinks. The medicine will cause constipation. Try to have a bowel movement at least every 2 to 3 days. If you do not have a bowel movement for 3 days, call your doctor or health care professional. Do not take Tylenol (acetaminophen) or medicines that have acetaminophen with this medicine. Too much acetaminophen can be very dangerous. Many nonprescription medicines contain  acetaminophen. Always read the labels carefully to avoid taking more acetaminophen. What side effects may I notice from receiving this medicine? Side effects that you should report to your doctor or health care professional as soon as possible: -allergic reactions like skin rash, itching or hives, swelling of the face, lips, or tongue -breathing difficulties, wheezing -confusion -light headedness or fainting spells -severe stomach pain -yellowing of the skin or the whites of the eyes Side effects that usually do not require medical attention (report to your doctor or health care professional if they continue or are bothersome): -dizziness -drowsiness -nausea -vomiting This list may not describe all possible side effects. Call your doctor for medical advice about side effects. You may report side effects to FDA at 1-800-FDA-1088. Where should I keep my medicine? Keep out of the reach of children. This medicine can be abused. Keep your medicine in a safe place to protect it from theft. Do not share this medicine with anyone. Selling or giving away this medicine is dangerous and against the law. Store at room temperature between 20 and 25 degrees C (68 and 77 degrees F). Keep container tightly closed. Protect from light. Flush any unused medicines down the toilet. Do not use the medicine after the expiration date. NOTE: This sheet is a summary. It may not cover all possible information. If you have questions about this medicine, talk to your doctor, pharmacist, or health care provider.  2012, [employer] Standard. (03/27/2008 10:01:21 AM)

## 2011-10-13 NOTE — ED Notes (Signed)
Patient alert and oriented.  Discharge inst reviewed and voiced understanding.  Prescriptions given.  Patient amb with out difficulty.

## 2012-01-19 ENCOUNTER — Emergency Department (HOSPITAL_COMMUNITY)
Admission: EM | Admit: 2012-01-19 | Discharge: 2012-01-19 | Disposition: A | Discharge status: Home / Self Care | Payer: Medicaid Other | Source: Home / Self Care | Attending: Emergency Medicine | Admitting: Emergency Medicine

## 2012-01-19 ENCOUNTER — Encounter (HOSPITAL_COMMUNITY): Payer: Self-pay | Admitting: Emergency Medicine

## 2012-01-19 DIAGNOSIS — M549 Dorsalgia, unspecified: Secondary | ICD-10-CM

## 2012-01-19 DIAGNOSIS — R319 Hematuria, unspecified: Secondary | ICD-10-CM

## 2012-01-19 HISTORY — DX: Obesity, unspecified: E66.9

## 2012-01-19 HISTORY — DX: Pain in unspecified foot: M79.673

## 2012-01-19 LAB — URINALYSIS, ROUTINE W REFLEX MICROSCOPIC
Glucose, UA: NEGATIVE mg/dL
Ketones, ur: NEGATIVE mg/dL
Leukocytes, UA: NEGATIVE
Nitrite: NEGATIVE
Protein, ur: NEGATIVE mg/dL
Urobilinogen, UA: 0.2 mg/dL (ref 0.0–1.0)

## 2012-01-19 LAB — URINE MICROSCOPIC-ADD ON

## 2012-01-19 LAB — POCT URINALYSIS DIP (DEVICE)
Bilirubin Urine: NEGATIVE
Glucose, UA: NEGATIVE mg/dL
Nitrite: NEGATIVE
Urobilinogen, UA: 0.2 mg/dL (ref 0.0–1.0)

## 2012-01-19 LAB — POCT PREGNANCY, URINE: Preg Test, Ur: NEGATIVE

## 2012-01-19 MED ORDER — KETOROLAC TROMETHAMINE 10 MG PO TABS: 10.0000 mg | ORAL_TABLET | Freq: Four times a day (QID) | ORAL | Status: AC | PRN

## 2012-01-19 MED ORDER — KETOROLAC TROMETHAMINE 30 MG/ML IJ SOLN
60.0000 mg | Freq: Once | INTRAMUSCULAR | Status: AC
  Administered 2012-01-19: 60 mg via INTRAMUSCULAR

## 2012-01-19 MED ORDER — OXYCODONE-ACETAMINOPHEN 5-325 MG PO TABS: 2.0000 | ORAL_TABLET | ORAL | Status: AC | PRN

## 2012-01-19 MED ORDER — TAMSULOSIN HCL 0.4 MG PO CAPS: 0.4000 mg | ORAL_CAPSULE | Freq: Every day | ORAL | Status: DC

## 2012-01-19 NOTE — ED Notes (Signed)
Pt having pain in the left hip and flank that started 2 days ago while driving. Pt states the pain causes insomnia, difficulty walking, and is a pain 10/10 most of the time. Pt says movement is especially difficult. Pt reports a shooting pain down to her knee that cause her to lose her breath it was so bad.  Pt denies urinary frequency, or dysuria.

## 2012-01-20 NOTE — ED Provider Notes (Signed)
History     CSN: [id number]  Arrival date & time 01/19/12  1756   First MD Initiated Contact with Patient 01/19/12 1806      Chief Complaint  Patient presents with  . Flank Pain    (Consider location/radiation/quality/duration/timing/severity/associated sxs/prior treatment) HPI Comments: Patient reports 2 days of constant left back, flank, hip pain that occasionally radiates to her upper left hip. Says pain is occasionally located in her back, other times in her flank, lower abdomen. States this started after being at a horse show all weekend, states that she slept in the car, but states that she was well padded. No particular aggravating factors. Has been taking ibuprofen, Aleve with mild temporary relief. Does not recall any different physical activity, heavy lifting, trauma to her back or flank.  No fevers, paresthesias, extremity weakness, urinary urgency, frequency, hematuria, cloudy or oderous urine, vaginal bleeding, vaginal discharge. No history of nephrolithiasis, cancer, IVDU, osteoporosis, prolonged steroid use.  No history of injury to her back.  ROS as noted in HPI. All other ROS negative.   Patient is a 31 y.o. female presenting with flank pain. The history is provided by the patient. No language interpreter was used.  Flank Pain This is a new problem. The current episode started 2 days ago. The problem occurs daily. The problem has been gradually worsening. Pertinent negatives include no chest pain and no abdominal pain. The symptoms are aggravated by walking and bending. The symptoms are relieved by NSAIDs. Treatments tried: nsaid. The treatment provided mild relief.    Past Medical History  Diagnosis Date  . Hypertension   . Foot pain   . Obesity     Past Surgical History  Procedure Date  . Cholecystectomy   . Breast reduction surgery     History reviewed. No pertinent family history.  History  Substance Use Topics  . Smoking status: Never Smoker   .  Smokeless tobacco: Not on file  . Alcohol Use: Yes    OB History    Grav Para Term Preterm Abortions TAB SAB Ect Mult Living                  Review of Systems  Cardiovascular: Negative for chest pain.  Gastrointestinal: Negative for abdominal pain.  Genitourinary: Positive for flank pain.    Allergies  Fish allergy; Flagyl; Macrolides and ketolides; Nitrofurantoin monohyd macro; and Vicodin  Home Medications   Current Outpatient Rx  Name Route Sig Dispense Refill  . BENAZEPRIL-HYDROCHLOROTHIAZIDE 10-12.5 MG PO TABS Oral Take 1 tablet by mouth daily.    Marland Kitchen HYDROCHLOROTHIAZIDE 12.5 MG PO CAPS Oral Take 12.5 mg by mouth daily.    Marland Kitchen KETOROLAC TROMETHAMINE 10 MG PO TABS Oral Take 1 tablet (10 mg total) by mouth 4 (four) times daily as needed for pain. 20 tablet 0  . OXYCODONE-ACETAMINOPHEN 5-325 MG PO TABS Oral Take 2 tablets by mouth every 4 (four) hours as needed for pain. 15 tablet 0  . TAMSULOSIN HCL 0.4 MG PO CAPS Oral Take 1 capsule (0.4 mg total) by mouth at bedtime. 14 capsule 0    BP 122/95  Pulse 107  Temp 98.7 F (37.1 C) (Oral)  Resp 22  SpO2 99%  LMP 12/23/2011  Physical Exam  Nursing note and vitals reviewed. Constitutional: She is oriented to person, place, and time. She appears well-developed and well-nourished. She appears distressed.       Appears uncomfortable  HENT:  Head: Normocephalic and atraumatic.  Eyes: Conjunctivae normal  and EOM are normal.  Neck: Normal range of motion.  Cardiovascular: Normal rate and regular rhythm.   Pulmonary/Chest: Effort normal and breath sounds normal.  Abdominal: Normal appearance and bowel sounds are normal. She exhibits no distension. There is tenderness. There is CVA tenderness. There is no rigidity, no rebound, no guarding, no tenderness at McBurney's point and negative Murphy's sign.       Left UVJ, flank tenderness, mild CVA tenderness.   Musculoskeletal:       Lumbar back: She exhibits no spasm.        Bilateral lower extremities nontender without new rashes or color change, baseline ROM with intact DP pulses. No pain with internal/external rotation hips bilaterally. SLR neg bilaterally. Sensation baseline light touch bilaterally for Pt, DTR's symmetric and intact bilaterally KJ, Motor symmetric bilateral 5/5 hip flexion, quadriceps, hamstrings, EHL, foot dorsiflexion, foot plantarflexion, gait somewhat antalgic but without apparent new ataxia.  Neurological: She is alert and oriented to person, place, and time. Coordination normal.  Skin: Skin is warm and dry.  Psychiatric: She has a normal mood and affect. Her behavior is normal. Judgment and thought content normal.    ED Course  Procedures (including critical care time)  Labs Reviewed  POCT URINALYSIS DIP (DEVICE) - Abnormal; Notable for the following:    Hgb urine dipstick MODERATE (*)     All other components within normal limits  URINALYSIS, ROUTINE W REFLEX MICROSCOPIC - Abnormal; Notable for the following:    Hgb urine dipstick MODERATE (*)     All other components within normal limits  URINE MICROSCOPIC-ADD ON - Abnormal; Notable for the following:    Squamous Epithelial / LPF FEW (*)     All other components within normal limits  POCT PREGNANCY, URINE  URINE CULTURE   No results found.   1. Back pain   2. Hematuria     MDM  H&P suggestive of nephrolithiasis given the hematuria, tenderness along her flank. No apparent lumbar strain. Toradol IM here. Will have her strain all of her urine. Patient denies any significant component of nausea., sending home with Toradol, Percocet, Flomax, have her follow with Dr. Margarita Grizzle, urology on call. Discussed symptoms and signs that should prompt return to the department. Patient agrees with plan   Luiz Blare, MD 01/21/12 0830

## 2012-01-21 LAB — URINE CULTURE: Colony Count: 15000

## 2012-01-21 MED ORDER — METRONIDAZOLE 0.75 % VA GEL: VAGINAL | Status: AC

## 2012-01-22 ENCOUNTER — Telehealth (HOSPITAL_COMMUNITY): Payer: Self-pay | Admitting: *Deleted

## 2012-01-22 NOTE — ED Notes (Signed)
Pt. wants Rx. called to [facility] on [address]  Rx. Metrogel 0.75 % vaginal gel 1 appl. full BID x 5 days 70 gm. called to [facility] on [address] voicemail at  the same time as previous note.  Vassie Moselle 01/22/2012

## 2012-01-22 NOTE — ED Notes (Signed)
9/10  Urine microscopic: rare trich. Urine culture pending.  Lab shown to Hayden Rasmussen NP. He said he would do a Rx.  9/11 Onalee Hua said Dr. Chaney Malling did the Rx.  9/12  Urine culture:  15,000 colonies mult. bacterial types none predominant.  I called pt.  Pt. verified x 2 and given results.  You need to notify your partner to be treated with Flagyl, no sex until you have finished your medication and your partner has been treated and to practice safe sex. Pt. Had question about how long she has had it. I told her I did not know. She has access to a computer and recommended she go to the [employer] and read about it. Vassie Moselle 01/22/2012

## 2012-02-04 ENCOUNTER — Encounter (HOSPITAL_COMMUNITY): Payer: Self-pay | Admitting: Family Medicine

## 2012-02-04 ENCOUNTER — Emergency Department (HOSPITAL_COMMUNITY): Payer: Medicaid Other

## 2012-02-04 ENCOUNTER — Emergency Department (HOSPITAL_COMMUNITY)
Admission: EM | Admit: 2012-02-04 | Discharge: 2012-02-04 | Disposition: A | Discharge status: Home / Self Care | Payer: Medicaid Other | Attending: Emergency Medicine | Admitting: Emergency Medicine

## 2012-02-04 DIAGNOSIS — R109 Unspecified abdominal pain: Secondary | ICD-10-CM | POA: Insufficient documentation

## 2012-02-04 DIAGNOSIS — M549 Dorsalgia, unspecified: Secondary | ICD-10-CM | POA: Insufficient documentation

## 2012-02-04 LAB — CBC WITH DIFFERENTIAL/PLATELET
Basophils Absolute: 0 10*3/uL (ref 0.0–0.1)
Basophils Relative: 0 % (ref 0–1)
Eosinophils Absolute: 0.2 10*3/uL (ref 0.0–0.7)
Hemoglobin: 13.4 g/dL (ref 12.0–15.0)
MCH: 28 pg (ref 26.0–34.0)
MCHC: 34.4 g/dL (ref 30.0–36.0)
Monocytes Absolute: 0.8 10*3/uL (ref 0.1–1.0)
Monocytes Relative: 9 % (ref 3–12)
Neutro Abs: 4.6 10*3/uL (ref 1.7–7.7)
Neutrophils Relative %: 51 % (ref 43–77)
RDW: 13.4 % (ref 11.5–15.5)

## 2012-02-04 LAB — URINALYSIS, ROUTINE W REFLEX MICROSCOPIC
Ketones, ur: NEGATIVE mg/dL
Protein, ur: NEGATIVE mg/dL
Urobilinogen, UA: 0.2 mg/dL (ref 0.0–1.0)

## 2012-02-04 LAB — BASIC METABOLIC PANEL
BUN: 9 mg/dL (ref 6–23)
Chloride: 101 mEq/L (ref 96–112)
Creatinine, Ser: 0.73 mg/dL (ref 0.50–1.10)
GFR calc Af Amer: >90 mL/min (ref >90)
GFR calc non Af Amer: >90 mL/min (ref >90)
Potassium: 3.1 mEq/L — ABNORMAL LOW (ref 3.5–5.1)

## 2012-02-04 LAB — URINE MICROSCOPIC-ADD ON

## 2012-02-04 LAB — PREGNANCY, URINE: Preg Test, Ur: NEGATIVE

## 2012-02-04 IMAGING — CT CT ABD-PELV W/O CM
2 of 4 series · 17 of 46 positions shown, 19 images · non-contrast
Comparison: None.

CLINICAL DATA: Right-sided abdominal and back pain.  Pain radiates
to right lower quadrant

CT ABDOMEN AND PELVIS WITHOUT CONTRAST
TECHNIQUE: Multidetector CT imaging of the abdomen and pelvis was
performed following the standard protocol without intravenous
contrast.

[Series 2: stone >200 lbs 5.0 b31f st · axial · 0.76mm/px · z∈[-814,-424]mm · 14 of 86 slices shown, 16 images]
[im 4/86  soft-tissue]
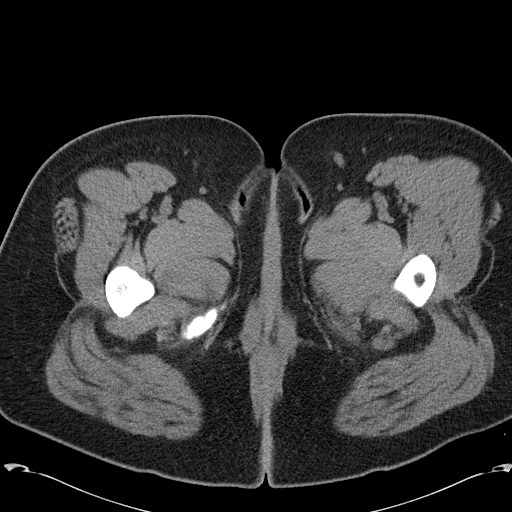
[im 4/86  bone]
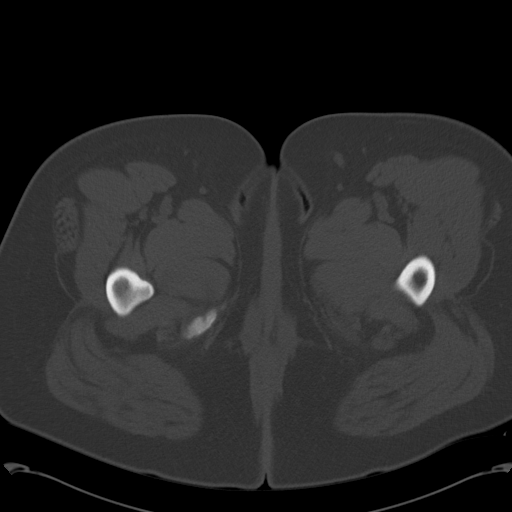
[im 11/86  soft-tissue]
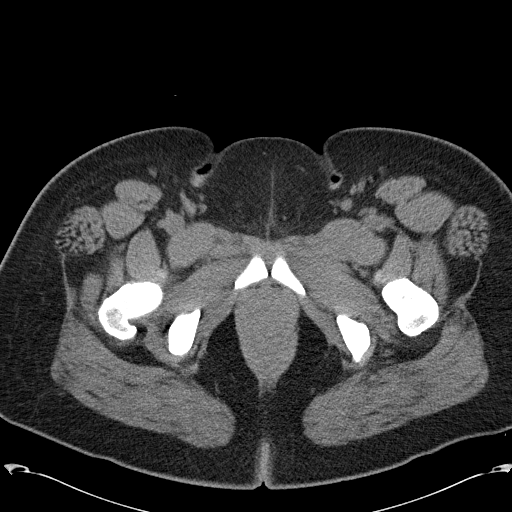
[im 18/86  soft-tissue]
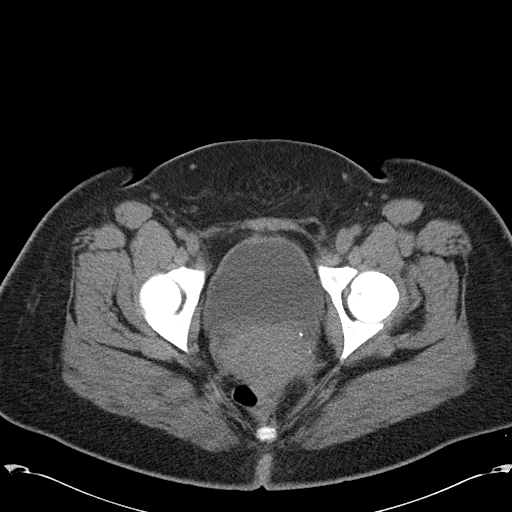
[im 24/86  soft-tissue]
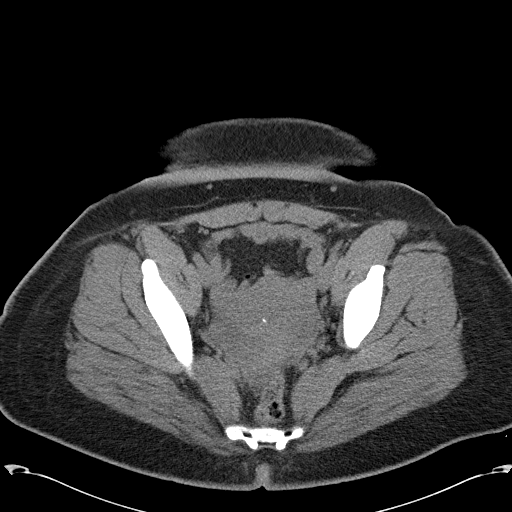
[im 28/86  soft-tissue]
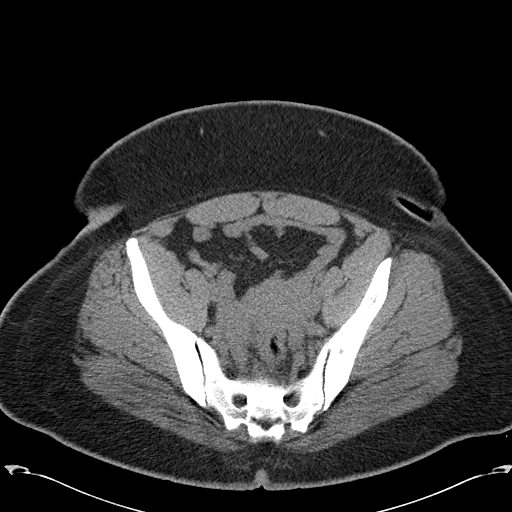
[im 35/86  soft-tissue]
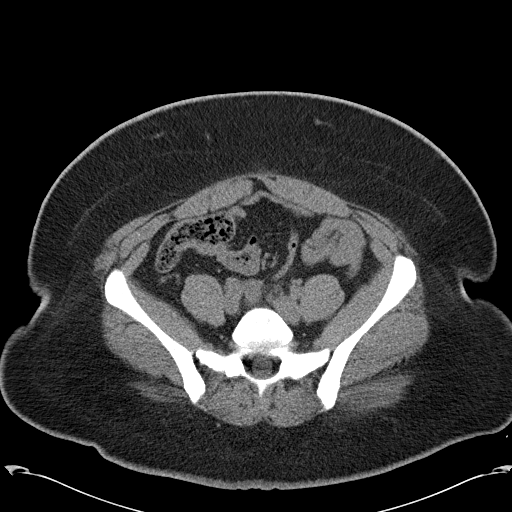
[im 41/86  soft-tissue]
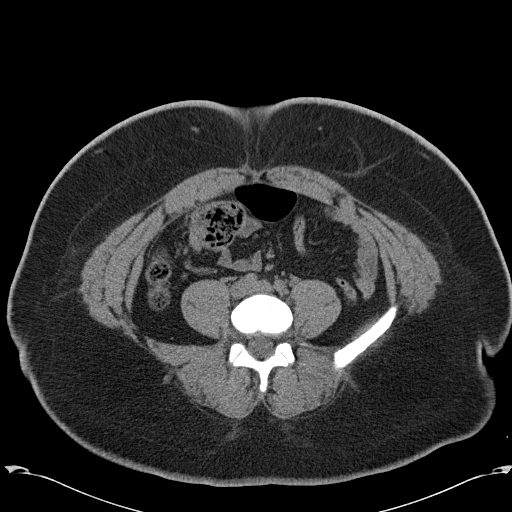
[im 45/86  soft-tissue]
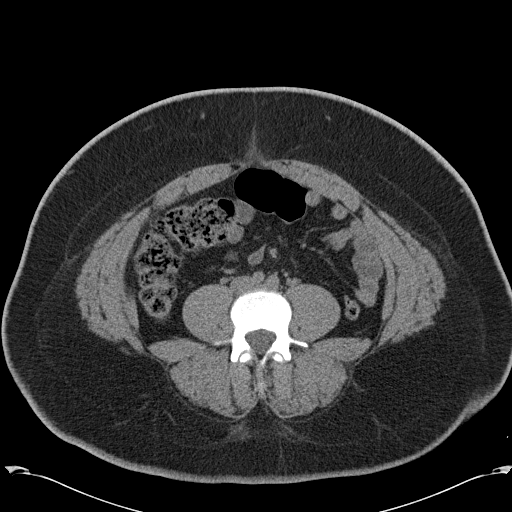
[im 52/86  soft-tissue]
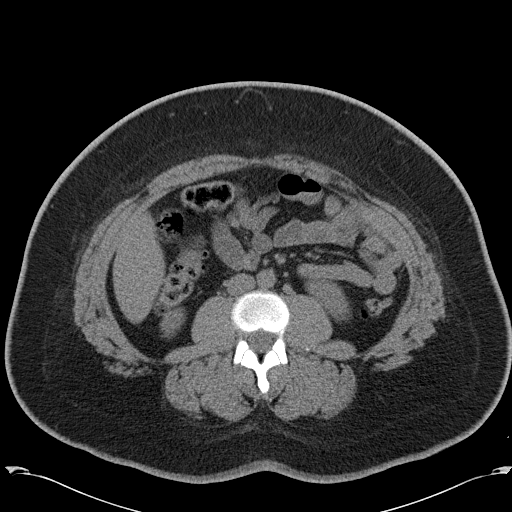
[im 52/86  bone]
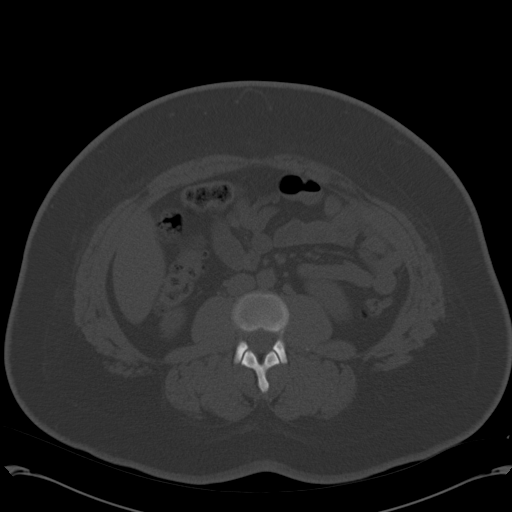
[im 58/86  soft-tissue]
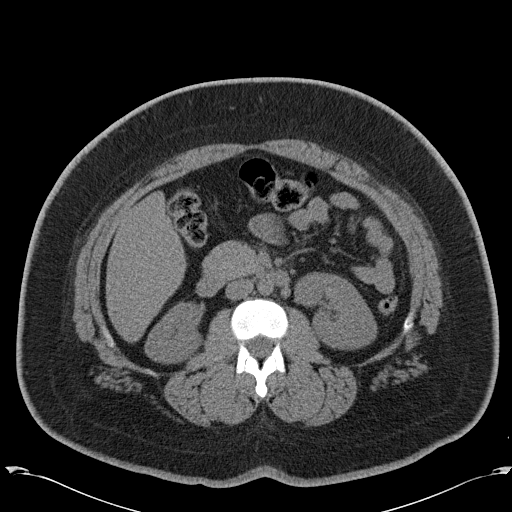
[im 65/86  soft-tissue]
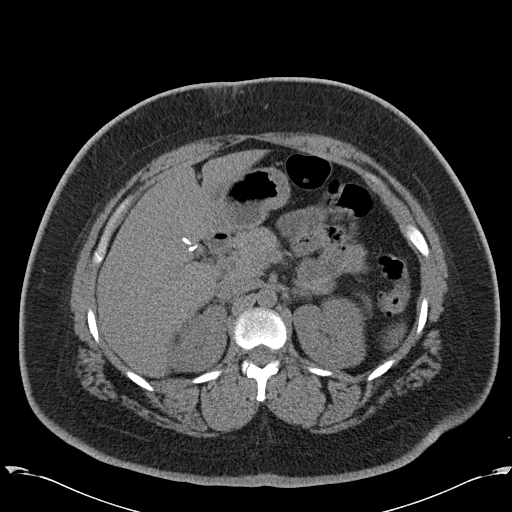
[im 69/86  soft-tissue]
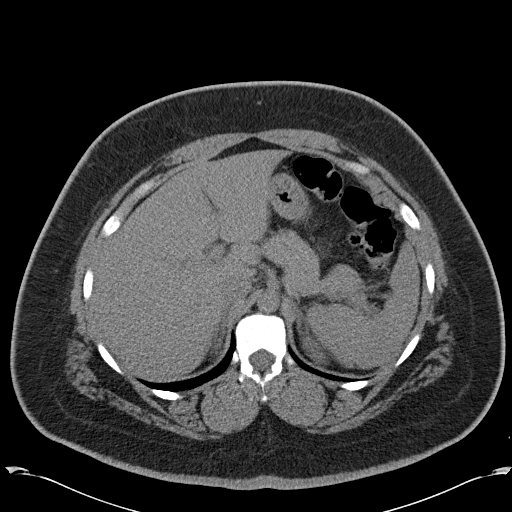
[im 75/86  soft-tissue]
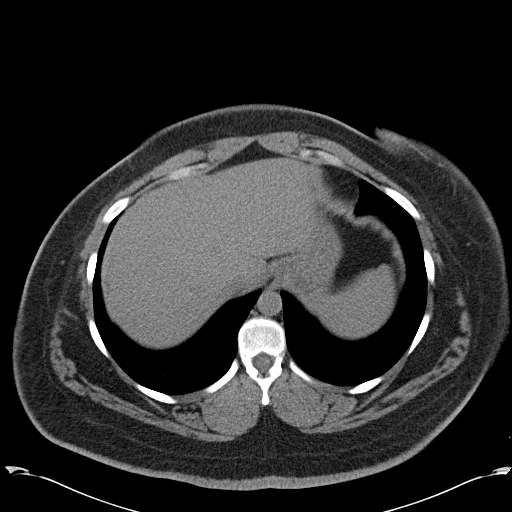
[im 82/86  soft-tissue]
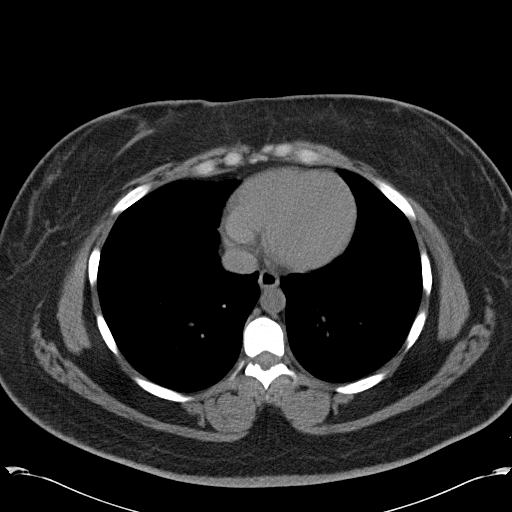

[Series 5: coronals cor · coronal · 0.91mm/px · 3 of 110 slices shown]
[im 37/110  soft-tissue]
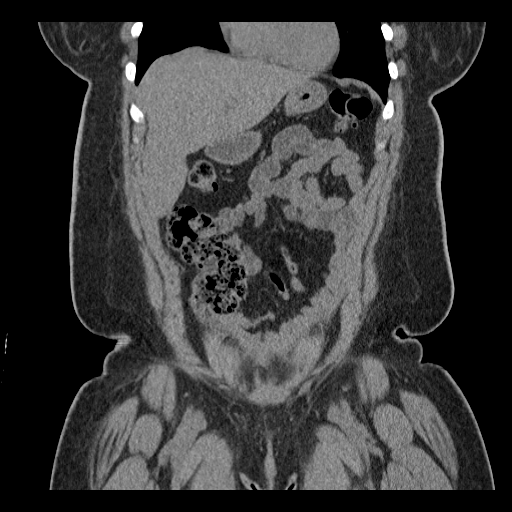
[im 49/110  soft-tissue]
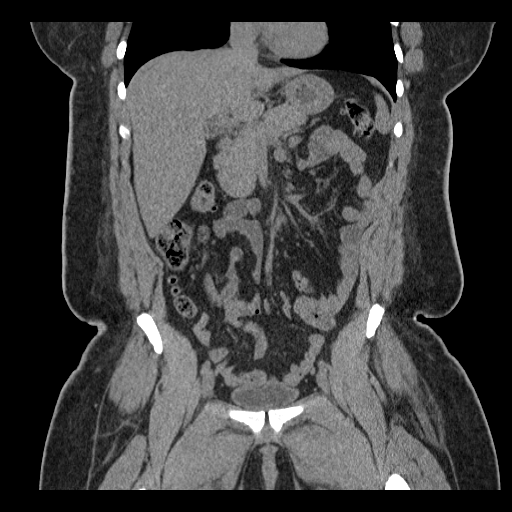
[im 61/110  soft-tissue]
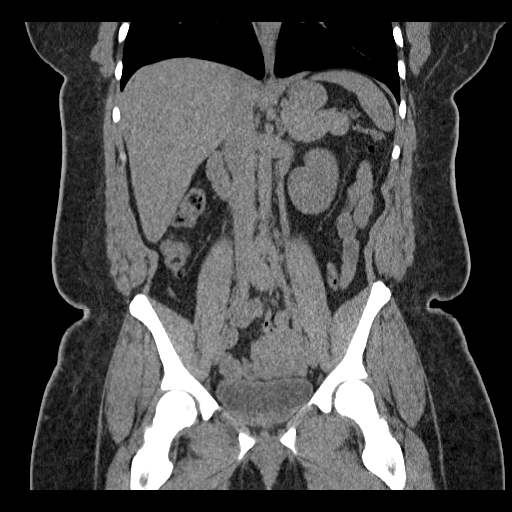

[17 of 46 positions shown; findings below may reference images not displayed]

FINDINGS: The kidneys are normal in size and attenuation with no
masses, stones or collecting system dilation.  Normal ureters.
Normal bladder.

An IUD lies mildly tilted within the uterine cavity.  The uterus
and adnexa are otherwise unremarkable.

Clear lung bases.  The heart is normal in size.  Normal liver,
spleen and pancreas.  No bile duct dilation.  The gallbladder is
surgically absent.  No adrenal masses.  No adenopathy.  No abnormal
fluid collections.

The bowel is unremarkable.  A normal appendix is visualized.  No
significant bony abnormality.
IMPRESSION: No acute findings.

No renal or ureteral stones or signs of obstruction.  Normal
appendix.

Status post cholecystectomy.

## 2012-02-04 MED ORDER — ONDANSETRON HCL 4 MG/2ML IJ SOLN
4.0000 mg | Freq: Once | INTRAMUSCULAR | Status: AC
  Administered 2012-02-04: 4 mg via INTRAVENOUS
  Filled 2012-02-04: qty 2

## 2012-02-04 MED ORDER — KETOROLAC TROMETHAMINE 30 MG/ML IJ SOLN
30.0000 mg | Freq: Once | INTRAMUSCULAR | Status: AC
  Administered 2012-02-04: 30 mg via INTRAVENOUS
  Filled 2012-02-04: qty 1

## 2012-02-04 MED ORDER — POTASSIUM CHLORIDE CRYS ER 20 MEQ PO TBCR
40.0000 meq | EXTENDED_RELEASE_TABLET | Freq: Once | ORAL | Status: AC
  Administered 2012-02-04: 40 meq via ORAL
  Filled 2012-02-04: qty 2

## 2012-02-04 MED ORDER — SODIUM CHLORIDE 0.9 % IV BOLUS (SEPSIS)
1000.0000 mL | Freq: Once | INTRAVENOUS | Status: AC
  Administered 2012-02-04: 1000 mL via INTRAVENOUS

## 2012-02-04 MED ORDER — TRAMADOL HCL 50 MG PO TABS: 50.0000 mg | ORAL_TABLET | Freq: Four times a day (QID) | ORAL | Status: DC | PRN

## 2012-02-04 MED ORDER — MORPHINE SULFATE 4 MG/ML IJ SOLN
4.0000 mg | Freq: Once | INTRAMUSCULAR | Status: AC
  Administered 2012-02-04: 4 mg via INTRAVENOUS
  Filled 2012-02-04: qty 1

## 2012-02-04 NOTE — ED Notes (Signed)
Pt sts lower back pain when she woke up this am. sts hurts to stand. Denies Urinary symptoms.

## 2012-02-04 NOTE — ED Notes (Signed)
Robin Davis S 8:00 PM patient discussed in sign out with Dr. Lajean Saver.  Patient with second visit for back and flank pain. Labs and UA unremarkable to this point. Patient going for CT scan noncontrast to rule out kidney stone or other concerning cause.   CT scan is unremarkable. No signs for kidney stone. No signs of appendicitis or other emergent cause of symptoms. At this time we'll treat patient's pain symptomatically with tramadol. She has been instructed to followup with primary care provider for reevaluation in 48 hours.     Angus Seller, [place] 02/04/12 2150

## 2012-02-04 NOTE — ED Provider Notes (Signed)
History  This chart was scribed for Glynn Octave, MD by Ardeen Jourdain. This patient was seen in room TR11C/TR11C and the patient's care was started at 1737.  CSN: [id number]  Arrival date & time 02/04/12  1652   First MD Initiated Contact with Patient 02/04/12 1737      Chief Complaint  Patient presents with  . Back Pain     The history is provided by the patient. No language interpreter was used.    Robin Davis is a 31 y.o. female who presents to the Emergency Department complaining of constant back pain that started this morning when she woke up. She states she has problems ambulating, but leaning forward helps. She had a similar problem 2 weeks ago, and was checked for kidney stones at a [facility]. She denies any radiation into legs. Pt denies fever, neck pain, sore throat, visual disturbance, CP, cough, SOB, abdominal pain, nausea, emesis, diarrhea, urinary symptoms, HA, weakness, numbness and rash as associated symptoms. She also denies urinary or bowel incontinence, vaginal discharge and bleeding. She has a h/o of HTN, foot pain and obesity. She denies smoking but uses alcohol. LNMP was 2 months ago.      Past Medical History  Diagnosis Date  . Hypertension   . Foot pain   . Obesity     Past Surgical History  Procedure Date  . Cholecystectomy   . Breast reduction surgery     History reviewed. No pertinent family history.  History  Substance Use Topics  . Smoking status: Never Smoker   . Smokeless tobacco: Not on file  . Alcohol Use: Yes   No OB history available.   Review of Systems  A complete 10 system review of systems was obtained and all systems are negative except as noted in the HPI and PMH.    Allergies  Fish allergy; Flagyl; Macrolides and ketolides; Nitrofurantoin monohyd macro; and Vicodin  Home Medications   Current Outpatient Rx  Name Route Sig Dispense Refill  . BENAZEPRIL-HYDROCHLOROTHIAZIDE 10-12.5 MG PO TABS Oral Take 1 tablet by  mouth daily.    Marland Kitchen HYDROCHLOROTHIAZIDE 12.5 MG PO CAPS Oral Take 12.5 mg by mouth daily.    Marland Kitchen TAMSULOSIN HCL 0.4 MG PO CAPS Oral Take 1 capsule (0.4 mg total) by mouth at bedtime. 14 capsule 0    Triage Vitals: BP 113/68  Pulse 111  Temp 98.3 F (36.8 C)  Resp 18  SpO2 97%  LMP 12/23/2011  Physical Exam  Nursing note and vitals reviewed. Constitutional: She is oriented to person, place, and time. She appears well-developed and well-nourished. No distress.  HENT:  Head: Normocephalic and atraumatic.  Eyes: EOM are normal. Pupils are equal, round, and reactive to light.  Neck: Normal range of motion. Neck supple. No tracheal deviation present.  Cardiovascular: Normal rate, regular rhythm and normal heart sounds.   Pulmonary/Chest: Effort normal and breath sounds normal. No respiratory distress.  Abdominal: Soft. There is no tenderness.  Musculoskeletal: Normal range of motion. She exhibits tenderness.       Straight Leg Raise positive on the right, right CVA tenderness. 5/5 strength in bilateral lower extremities. Ankle plantar and dorsiflexion intact. Great toe extension intact bilaterally. +2 DP and PT pulses. +2 patellar reflexes bilaterally. Normal gait.    Neurological: She is alert and oriented to person, place, and time.  Skin: Skin is warm and dry.  Psychiatric: She has a normal mood and affect. Her behavior is normal.    ED Course  Procedures (including critical care time)  DIAGNOSTIC STUDIES: Oxygen Saturation is 97% on room air, normal by my interpretation.    COORDINATION OF CARE:  17:50-Discussed planned course of treatment with the patient including pain medication and CT abdomen, blood work and UA, who is agreeable at this time.   18:15-Medication Orders: Morphine 4 mg/mL injection 4 mg-once; Ondansetron (Zofran) injection 4 mg-once; Ketorolac (Toradol) 30 mg/mL injection 30 mg-once; Sodium chloride 0.9% bolus 1,000 mL-once  Results for orders placed during the  hospital encounter of 02/04/12  URINALYSIS, ROUTINE W REFLEX MICROSCOPIC      Component Value Range   Color, Urine YELLOW  YELLOW   APPearance CLEAR  CLEAR   Specific Gravity, Urine 1.017  1.005 - 1.030   pH 6.5  5.0 - 8.0   Glucose, UA NEGATIVE  NEGATIVE mg/dL   Hgb urine dipstick NEGATIVE  NEGATIVE   Bilirubin Urine NEGATIVE  NEGATIVE   Ketones, ur NEGATIVE  NEGATIVE mg/dL   Protein, ur NEGATIVE  NEGATIVE mg/dL   Urobilinogen, UA 0.2  0.0 - 1.0 mg/dL   Nitrite NEGATIVE  NEGATIVE   Leukocytes, UA SMALL (*) NEGATIVE  PREGNANCY, URINE      Component Value Range   Preg Test, Ur NEGATIVE  NEGATIVE  CBC WITH DIFFERENTIAL      Component Value Range   WBC 9.1  4.0 - 10.5 K/uL   RBC 4.79  3.87 - 5.11 MIL/uL   Hemoglobin 13.4  12.0 - 15.0 g/dL   HCT 62.1  30.8 - 65.7 %   MCV 81.2  78.0 - 100.0 fL   MCH 28.0  26.0 - 34.0 pg   MCHC 34.4  30.0 - 36.0 g/dL   RDW 84.6  96.2 - 95.2 %   Platelets 343  150 - 400 K/uL   Neutrophils Relative 51  43 - 77 %   Neutro Abs 4.6  1.7 - 7.7 K/uL   Lymphocytes Relative 38  12 - 46 %   Lymphs Abs 3.5  0.7 - 4.0 K/uL   Monocytes Relative 9  3 - 12 %   Monocytes Absolute 0.8  0.1 - 1.0 K/uL   Eosinophils Relative 2  0 - 5 %   Eosinophils Absolute 0.2  0.0 - 0.7 K/uL   Basophils Relative 0  0 - 1 %   Basophils Absolute 0.0  0.0 - 0.1 K/uL  BASIC METABOLIC PANEL      Component Value Range   Sodium 138  135 - 145 mEq/L   Potassium 3.1 (*) 3.5 - 5.1 mEq/L   Chloride 101  96 - 112 mEq/L   CO2 26  19 - 32 mEq/L   Glucose, Bld 86  70 - 99 mg/dL   BUN 9  6 - 23 mg/dL   Creatinine, Ser 8.41  0.50 - 1.10 mg/dL   Calcium 32.4  8.4 - 40.1 mg/dL   GFR calc non Af Amer >90  >90 mL/min   GFR calc Af Amer >90  >90 mL/min  URINE MICROSCOPIC-ADD ON      Component Value Range   Squamous Epithelial / LPF FEW (*) RARE   WBC, UA 3-6  <3 WBC/hpf   Bacteria, UA RARE  RARE     No results found.   No diagnosis found.    MDM  Obese female with R CVA pain.   No neuro deficits or red flags.  Denies urinary symptoms.  Pending CT to r/o kidney stone at time of sign out to  PA Dammen.    I personally performed the services described in this documentation, which was scribed in my presence.  The recorded information has been reviewed and considered.   Glynn Octave, MD 03/03/12 2237

## 2012-02-05 NOTE — ED Provider Notes (Signed)
Robin Davis S 8:00 PM patient discussed in sign out with Dr. Lajean Saver.  Patient with second visit for back and flank pain. Labs and UA unremarkable to this point. Patient going for CT scan noncontrast to rule out kidney stone or other concerning cause.   CT scan is unremarkable. No signs for kidney stone. No signs of appendicitis or other emergent cause of symptoms. At this time we'll treat patient's pain symptomatically with tramadol. She has been instructed to followup with primary care provider for reevaluation in 48 hours.    Angus Seller, PA 02/05/12 Jeralyn Bennett

## 2012-02-05 NOTE — ED Provider Notes (Signed)
Medical screening examination/treatment/procedure(s) were performed by non-physician practitioner and as supervising physician I was immediately available for consultation/collaboration.   Gwyneth Sprout, MD 02/05/12 1418

## 2012-02-09 ENCOUNTER — Encounter (HOSPITAL_COMMUNITY): Payer: Self-pay | Admitting: *Deleted

## 2012-02-09 ENCOUNTER — Inpatient Hospital Stay (HOSPITAL_COMMUNITY): Payer: Medicaid Other

## 2012-02-09 ENCOUNTER — Inpatient Hospital Stay (HOSPITAL_COMMUNITY)
Admission: AD | Admit: 2012-02-09 | Discharge: 2012-02-09 | Disposition: A | Discharge status: Home / Self Care | Payer: Medicaid Other | Source: Ambulatory Visit | Attending: Obstetrics & Gynecology | Admitting: Obstetrics & Gynecology

## 2012-02-09 DIAGNOSIS — T8332XA Displacement of intrauterine contraceptive device, initial encounter: Secondary | ICD-10-CM

## 2012-02-09 DIAGNOSIS — R109 Unspecified abdominal pain: Secondary | ICD-10-CM | POA: Insufficient documentation

## 2012-02-09 DIAGNOSIS — M549 Dorsalgia, unspecified: Secondary | ICD-10-CM | POA: Insufficient documentation

## 2012-02-09 DIAGNOSIS — R102 Pelvic and perineal pain: Secondary | ICD-10-CM

## 2012-02-09 DIAGNOSIS — Z30431 Encounter for routine checking of intrauterine contraceptive device: Secondary | ICD-10-CM | POA: Insufficient documentation

## 2012-02-09 DIAGNOSIS — N949 Unspecified condition associated with female genital organs and menstrual cycle: Secondary | ICD-10-CM

## 2012-02-09 LAB — URINALYSIS, ROUTINE W REFLEX MICROSCOPIC
Bilirubin Urine: NEGATIVE
Glucose, UA: NEGATIVE mg/dL
Ketones, ur: NEGATIVE mg/dL
Protein, ur: NEGATIVE mg/dL
pH: 6 (ref 5.0–8.0)

## 2012-02-09 IMAGING — US US PELVIS COMPLETE
1 series · 13 of 25 positions shown · non-contrast
Comparison: CT stone study 02/04/2012

CLINICAL DATA: Pelvic pain.  Evaluate intrauterine device position.

TRANSABDOMINAL ULTRASOUND OF PELVIS
TECHNIQUE: Transabdominal ultrasound examination of the pelvis was
performed including evaluation of the uterus, ovaries, adnexal
regions, and pelvic cul-de-sac.

[Series 1: us pelvis complete · 13 of 49 slices shown]
[im 1/49]
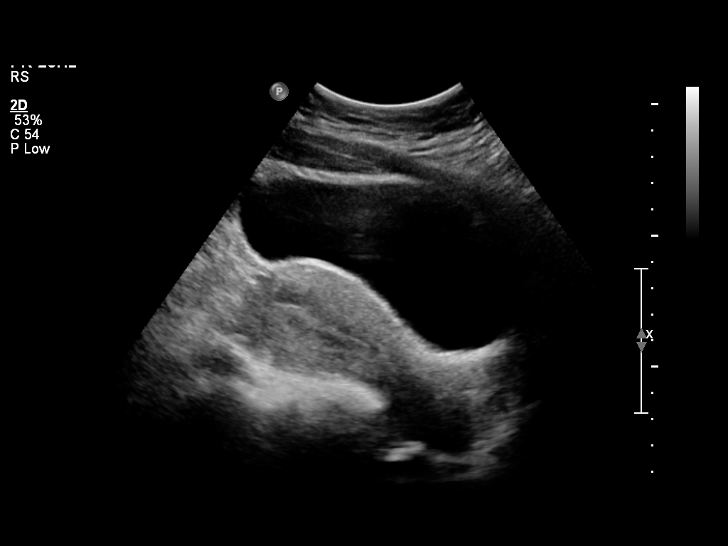
[im 5/49]
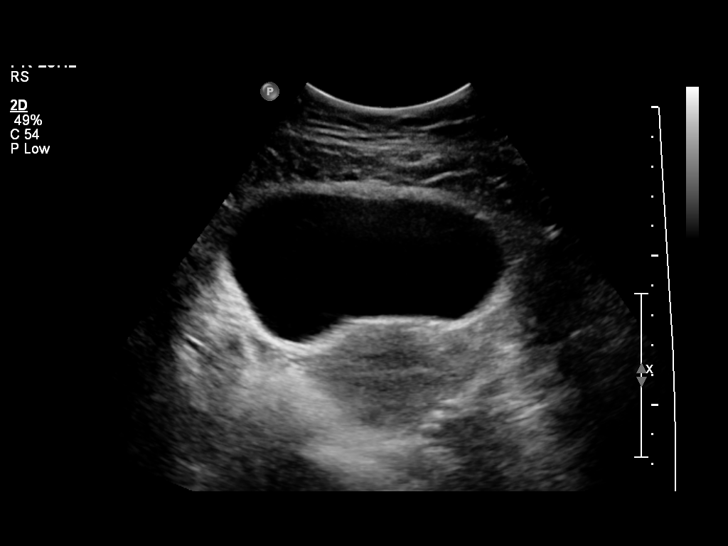
[im 9/49]
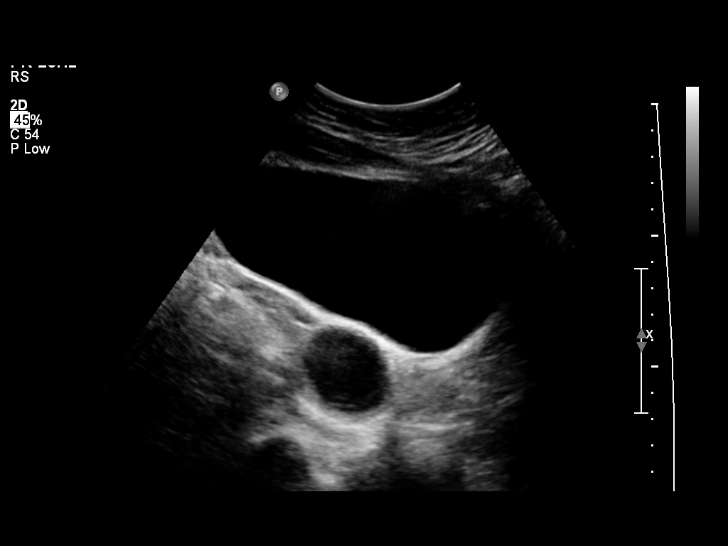
[im 13/49]
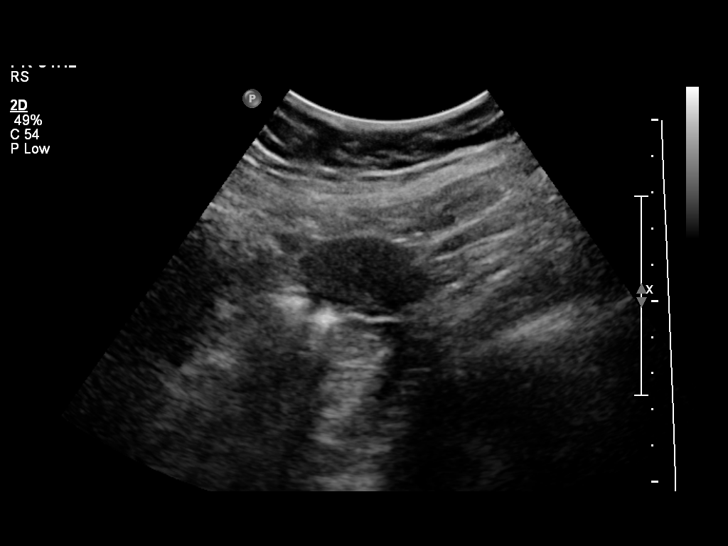
[im 17/49]
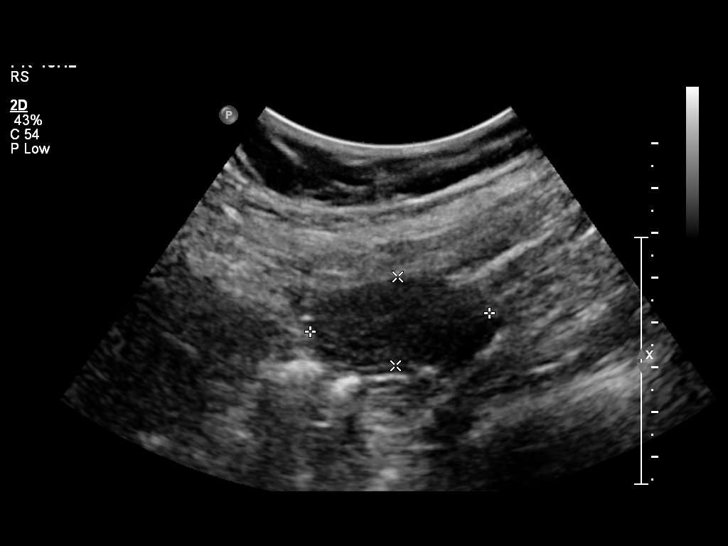
[im 21/49]
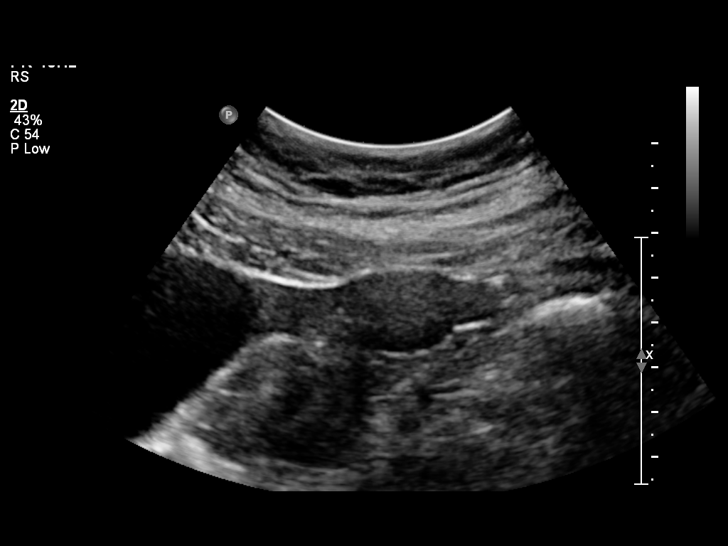
[im 25/49]
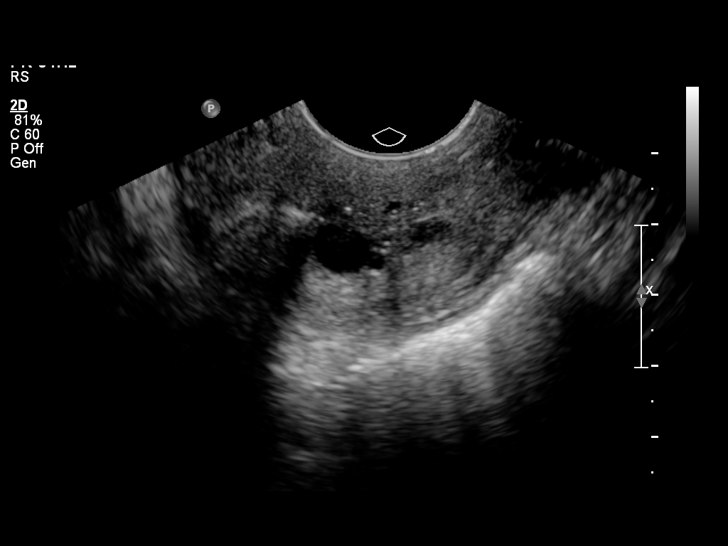
[im 29/49]
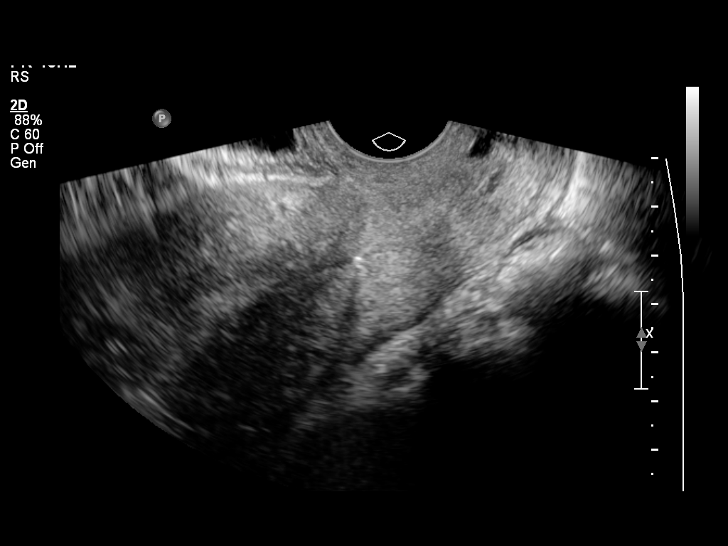
[im 33/49]
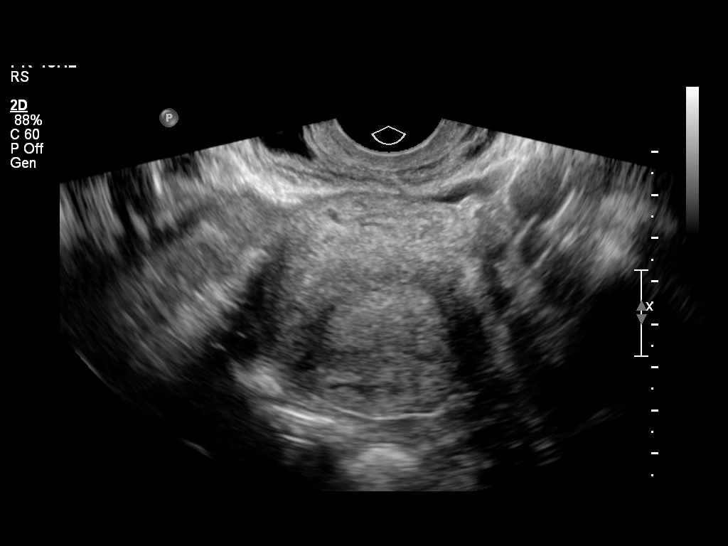
[im 37/49]
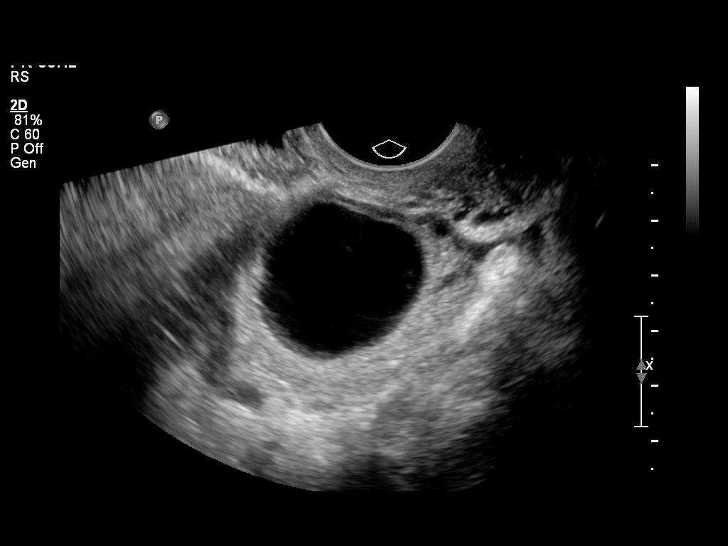
[im 41/49]
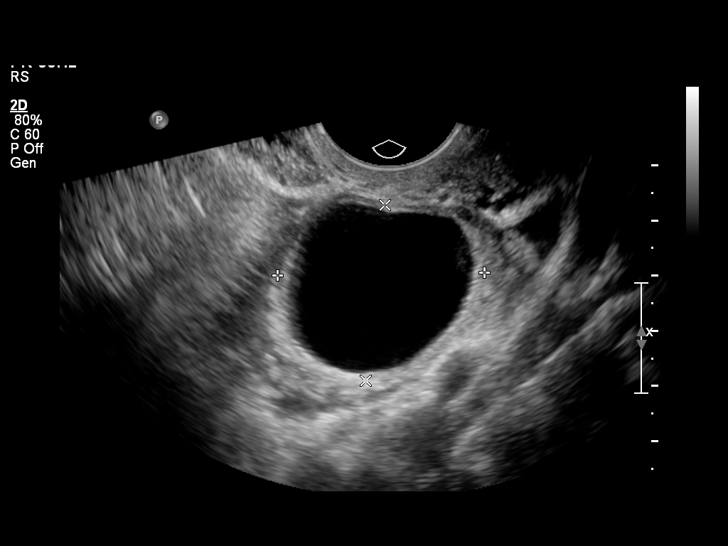
[im 45/49]
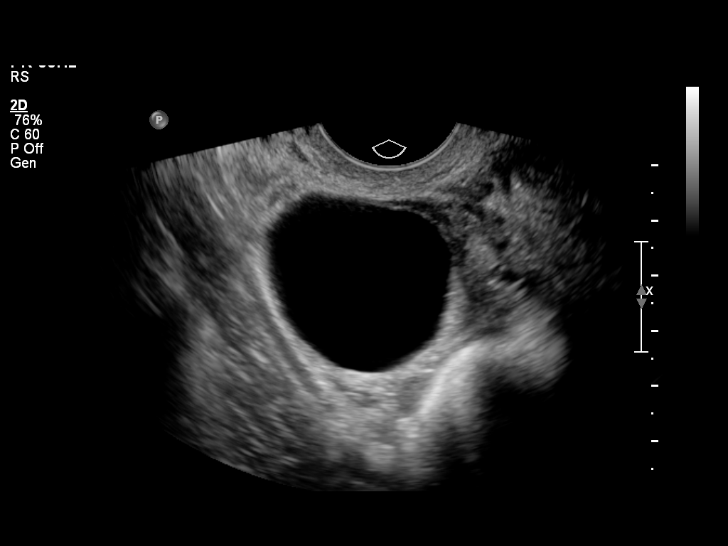
[im 49/49]
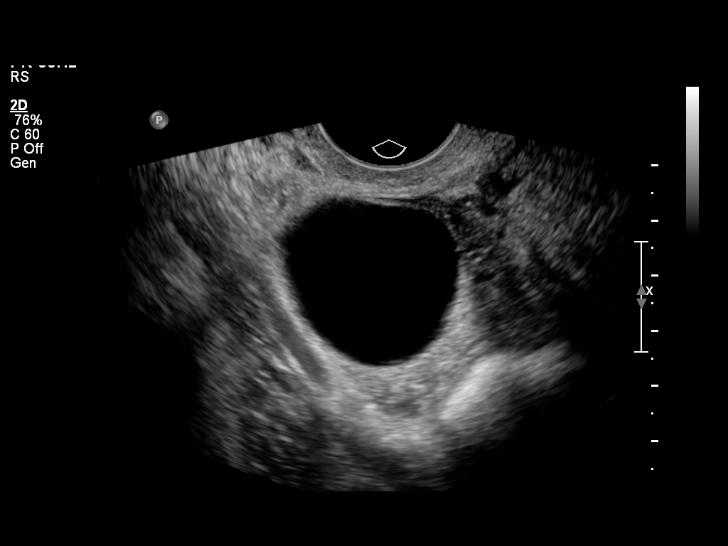

[13 of 25 positions shown; findings below may reference images not displayed]

FINDINGS: Uterus:  Measures 10.4 x 5 x 5.9 cm.  Uterus is anteverted,
slightly heterogeneous in echotexture.  No focal uterine mass is
identified.  The contour of the uterus is normal.

Endometrium: Normal in thickness and appearance.  Measures 6 mm.

The intrauterine device is positioned within the lower uterine
segment and may extend into the upper cervical canal.  3-D images
of the intrauterine device were attempted, but do not allow for
improved visualization of the intrauterine device.  On the standard
ultrasound images, the crossbars of the intrauterine device are
somewhat difficult to visualize.  No evidence of extension into the
myometrium is identified.

Right ovary: Measures 5.2 x 4.0 x 4.8 cm and contains a unilocular
simple cyst measuring 3.8 x 3.2 x 3.6 cm.  Color Doppler flow is
seen within the ovarian parenchyma surrounding this cyst.  No flow
is seen within the cyst.

Left ovary: Normal appearance/no adnexal mass.  Measures 4.0 x
x 3.3 cm.

Other Findings:  No free fluid.
IMPRESSION: 1.  Intrauterine device is suboptimally positioned within the lower
uterine segment, and may extend into the upper cervical canal.

2.  3.8 cm simple cyst right ovary. This is almost certainly
benign, and no specific imaging follow up is recommended according
to the Society of Radiologists in Ultrasound 1121 Consensus
Conference Statement (Dhonkeyolhu Gajkko et al. Management of Asymptomatic
Ovarian and Other Adnexal Cysts Imaged at US:  Society of
Radiologists in Ultrasound Consensus Conference Statement 1121.
Radiology [DATE]): [id number].).

## 2012-02-09 MED ORDER — NAPROXEN 500 MG PO TABS: 500.0000 mg | ORAL_TABLET | Freq: Two times a day (BID) | ORAL | Status: DC

## 2012-02-09 MED ORDER — OXYCODONE-ACETAMINOPHEN 5-325 MG PO TABS: 1.0000 | ORAL_TABLET | Freq: Four times a day (QID) | ORAL | Status: DC | PRN

## 2012-02-09 MED ORDER — KETOROLAC TROMETHAMINE 60 MG/2ML IM SOLN
60.0000 mg | Freq: Once | INTRAMUSCULAR | Status: AC
  Administered 2012-02-09: 60 mg via INTRAMUSCULAR
  Filled 2012-02-09: qty 2

## 2012-02-09 NOTE — MAU Provider Note (Addendum)
History     CSN: [id number]  Arrival date and time: 02/09/12 1210   First Provider Initiated Contact with Patient 02/09/12 1326      Chief Complaint  Patient presents with  . Abdominal Pain  . Back Pain   HPI Abdominal/back pain for 2 weeks. Pain is in a band in around lower back/pelvis and into "butt" muscle and back of R leg. Pain meds not helping. Constant. Lying on left side helps. Sleeping sitting up helps. Laying flat makes pain worse. Sleeping with legs elevated helps. No fever/chills. Normal physiological discharge. Last period in August. Has Mirena since 2011. Has period every 2 months. [facility] Goes to [facility] for primary care. Has appt in next few days. No numbness, tingling, weakness.  Has tried Aleve (880 mg), ibuprofen (800 mg). Tramadol (shaky), morphine did not work. Cannot function.   Pt also told that her IUD could be malpositioned. Partner could not feel strings recently. CT done a few days ago shows IUD tilted in uterus.  Pertinent Gynecological History: Menses: flow is excessive with use of q 1 hour  pads or tampons on heaviest days Bleeding: No intermenstrual bleeding Contraception: IUD DES exposure: unknown Blood transfusions: none Sexually transmitted diseases: treated for trichomonas in September, gonorrhea, chlamydia and trichomonas as teen. Previous GYN Procedures: DNC and   Last mammogram: n/a Date: n/a Last pap: normal Date: 1-2 years ago   Past Medical History  Diagnosis Date  . Hypertension   . Foot pain   . Obesity     Past Surgical History  Procedure Date  . Cholecystectomy   . Breast reduction surgery     History reviewed. No pertinent family history.  History  Substance Use Topics  . Smoking status: Never Smoker   . Smokeless tobacco: Not on file  . Alcohol Use: Yes     4 times a year    Allergies:  Allergies  Allergen Reactions  . Fish Allergy Shortness Of Breath and Swelling    . Macrobid (Nitrofurantoin Macrocrystal) Shortness Of Breath and Other (See Comments)    dizziness  . Flagyl (Metronidazole Hcl)     Unknown, but patient thinks is causes dizziness & sweating, possible shortness of breath  . Vicodin (Hydrocodone-Acetaminophen) Nausea And Vomiting and Other (See Comments)    dizziness    Prescriptions prior to admission  Medication Sig Dispense Refill  . benazepril-hydrochlorthiazide (LOTENSIN HCT) 10-12.5 MG per tablet Take 1 tablet by mouth daily.      . phentermine 37.5 MG capsule Take 37.5 mg by mouth every morning.        Review of Systems  Constitutional: Negative for fever and chills.  Eyes: Negative for blurred vision and double vision.  Respiratory: Negative for shortness of breath.   Cardiovascular: Negative for chest pain.  Gastrointestinal: Positive for abdominal pain and constipation. Negative for nausea, vomiting and diarrhea.  Genitourinary: Negative for dysuria, urgency and frequency.  Musculoskeletal: Positive for back pain.  Neurological: Negative for dizziness, weakness and headaches.   Physical Exam   Blood pressure 118/75, pulse 95, temperature 98.7 F (37.1 C), temperature source Oral, resp. rate 18, height 5' 1.75" (1.568 m), weight 96.888 kg (213 lb 9.6 oz), last menstrual period 12/23/2011, SpO2 99.00%.  Physical Exam  Constitutional: She is oriented to person, place, and time. She appears well-developed and well-nourished. No distress.  HENT:  Head: Normocephalic and atraumatic.  Eyes: Conjunctivae normal and EOM are normal.  Neck:  Normal range of motion. Neck supple. Thyromegaly present.  Cardiovascular: Normal rate, regular rhythm and normal heart sounds.   Respiratory: Effort normal and breath sounds normal. No respiratory distress.  GI: Soft. She exhibits no distension. There is no tenderness. There is no rebound and no guarding.  Genitourinary:       Normal external genitalia. Vaginal normal, no discharge. Normal  cervix, IUD strings visible coming from os.   Musculoskeletal: Normal range of motion. She exhibits no edema and no tenderness.  Neurological: She is alert and oriented to person, place, and time. She has normal reflexes.       Strength 5/5 and equal bilaterally upper and lower extremities. Sensation grossly intact and equal bilaterally.  Skin: Skin is warm and dry.  Psychiatric: She has a normal mood and affect.  Back/hip/gluteal area - no tenderness.  MAU Course  Procedures  60 mg Toradol IM Pelvic ultrasound to look for IUD placement:  1. Intrauterine device is suboptimally positioned within the lower  uterine segment, and may extend into the upper cervical canal.  2. 3.8 cm simple cyst right ovary. This is almost certainly  benign, and no specific imaging follow up is recommended according  to the Society of Radiologists in Ultrasound 2010 Consensus  Conference Statement (D Lenis Noon et al. Management of Asymptomatic  Ovarian and Other Adnexal Cysts Imaged at [place]: Society of  Radiologists in Ultrasound Consensus Conference Statement 2010.  Radiology 256 (Sept 2010): 943-954.).   Based on sono, offered patient removal of IUD. Discussed need for back-up contraception. Pt desired to have IUD removed. I explained that this may not be the cause of her pain and that pain may not get better after removal. Pt expressed understanding.  IUD removed by speculum exam. Strings easily visible at cervical os and grasped with ring forceps. IUD removed in its entirety atraumatically. No bleeding.   Assessment and Plan  31 y.o. female with back/abdominal pain and concern for malpositioned IUD.  1.  Back pain - likely herniated disk or muscle strain/pyriformis syndrome.  F/U with PCP. Naproxen. Will give a few oxycodone but pt has been relying on pain meds from ER for a while.  2.  Abdominal pain:  Naproxen. May resolve with removal of IUD but not necessarily.  F/U at [facility] for pap smear,  replacement of IUD or other contraception. F/U at [facility] for back pain.  Napoleon Form 02/09/2012, 1:33 PM

## 2012-02-09 NOTE — MAU Note (Signed)
Patient states she has had an IUD for about 3 years put in by [facility]. States her boyfriend did not feel the string with intercourse last night. Has been having bad abdominal and back pain for about 2 weeks. Was recently diagnosed with a UTI  which she was treated.Marland Kitchen Has had CT. Now thinks it might be the Mirena is out of place.

## 2012-03-28 ENCOUNTER — Encounter (HOSPITAL_COMMUNITY): Payer: Self-pay | Admitting: *Deleted

## 2012-03-28 ENCOUNTER — Emergency Department (HOSPITAL_COMMUNITY)
Admission: EM | Admit: 2012-03-28 | Discharge: 2012-03-29 | Disposition: A | Discharge status: Home / Self Care | Payer: No Typology Code available for payment source | Attending: Emergency Medicine | Admitting: Emergency Medicine

## 2012-03-28 DIAGNOSIS — R110 Nausea: Secondary | ICD-10-CM | POA: Insufficient documentation

## 2012-03-28 DIAGNOSIS — R51 Headache: Secondary | ICD-10-CM | POA: Insufficient documentation

## 2012-03-28 DIAGNOSIS — R0602 Shortness of breath: Secondary | ICD-10-CM | POA: Insufficient documentation

## 2012-03-28 DIAGNOSIS — R259 Unspecified abnormal involuntary movements: Secondary | ICD-10-CM | POA: Insufficient documentation

## 2012-03-28 DIAGNOSIS — F411 Generalized anxiety disorder: Secondary | ICD-10-CM | POA: Insufficient documentation

## 2012-03-28 DIAGNOSIS — Z79899 Other long term (current) drug therapy: Secondary | ICD-10-CM | POA: Insufficient documentation

## 2012-03-28 DIAGNOSIS — I10 Essential (primary) hypertension: Secondary | ICD-10-CM | POA: Insufficient documentation

## 2012-03-28 DIAGNOSIS — E669 Obesity, unspecified: Secondary | ICD-10-CM | POA: Insufficient documentation

## 2012-03-28 DIAGNOSIS — R42 Dizziness and giddiness: Secondary | ICD-10-CM | POA: Insufficient documentation

## 2012-03-28 DIAGNOSIS — F419 Anxiety disorder, unspecified: Secondary | ICD-10-CM

## 2012-03-28 LAB — POCT I-STAT, CHEM 8
BUN: 15 mg/dL (ref 6–23)
Calcium, Ion: 1.23 mmol/L (ref 1.12–1.23)
Chloride: 98 mEq/L (ref 96–112)
Creatinine, Ser: 1 mg/dL (ref 0.50–1.10)
Glucose, Bld: 105 mg/dL — ABNORMAL HIGH (ref 70–99)
HCT: 42 % (ref 36.0–46.0)
Hemoglobin: 14.3 g/dL (ref 12.0–15.0)
Potassium: 3.7 mEq/L (ref 3.5–5.1)
Sodium: 139 mEq/L (ref 135–145)
TCO2: 28 mmol/L (ref 0–100)

## 2012-03-28 LAB — POCT PREGNANCY, URINE: Preg Test, Ur: NEGATIVE

## 2012-03-28 NOTE — ED Notes (Addendum)
C/o having panic attacks back to back, frequently since yesterday. C/o "head blowing up with air", eye pain, sob, sweats, heart beating fast, dizzy, weak", states, "this is the worst & it isn't resolving", comes and goes, last at 1800.n alert, NAD, calm, interactive, skin W&D, resps e/u. (denies: vd, fever, HA, pain other than eye pain). Admits to some nausea. Usually takes xanax, but has not had any today d/t needs to drive.

## 2012-03-28 NOTE — ED Provider Notes (Signed)
History     CSN: [id number]  Arrival date & time 03/28/12  2030   First MD Initiated Contact with Patient 03/28/12 2153      Chief Complaint  Patient presents with  . Panic Attack  . Headache    (Consider location/radiation/quality/duration/timing/severity/associated sxs/prior treatment) HPI History provided by pt.   Pt has a h/o anxiety for which she takes prn xanax, and panic attacks usually occur once per month.  For the past 2 days, panic attacks have been back to back and they are more severe than normal.  Last for approx 15-18min. Start with palpitations, described as racing heart beat, followed by headache, lightheadedness, SOB, tremors, nausea and hot flash.  Denies syncope and chest pain.  Has also had a dry mouth for the past 2 days.  Xanax puts her to sleep but she is unsure of whether or not it is helping her sx.  No recent illnesses.   Past Medical History  Diagnosis Date  . Hypertension   . Foot pain   . Obesity     Past Surgical History  Procedure Date  . Cholecystectomy   . Breast reduction surgery     History reviewed. No pertinent family history.  History  Substance Use Topics  . Smoking status: Never Smoker   . Smokeless tobacco: Not on file  . Alcohol Use: Yes     Comment: 4 times a year    OB History    Grav Para Term Preterm Abortions TAB SAB Ect Mult Living   5 3 2 1 2     2       Review of Systems  All other systems reviewed and are negative.    Allergies  Fish allergy; Macrobid; Flagyl; and Vicodin  Home Medications   Current Outpatient Rx  Name  Route  Sig  Dispense  Refill  . BENAZEPRIL-HYDROCHLOROTHIAZIDE 10-12.5 MG PO TABS   Oral   Take 1 tablet by mouth daily.         Marland Kitchen PHENTERMINE HCL 37.5 MG PO CAPS   Oral   Take 37.5 mg by mouth every morning.           BP 120/61  Pulse 110  Temp 98.4 F (36.9 C) (Oral)  Resp 18  SpO2 98%  LMP 03/14/2012  Physical Exam  Nursing note and vitals reviewed. Constitutional:  She is oriented to person, place, and time. She appears well-developed and well-nourished. No distress.  HENT:  Head: Normocephalic and atraumatic.  Eyes:       Normal appearance  Neck: Normal range of motion.  Cardiovascular: Regular rhythm.        Mildly tachycardic  Pulmonary/Chest: Effort normal and breath sounds normal. No respiratory distress.  Musculoskeletal: Normal range of motion.  Neurological: She is alert and oriented to person, place, and time.  Skin: Skin is warm and dry. No rash noted.  Psychiatric: She has a normal mood and affect. Her behavior is normal.    ED Course  Procedures (including critical care time)  Labs Reviewed  POCT I-STAT, CHEM 8 - Abnormal; Notable for the following:    Glucose, Bld 105 (*)     All other components within normal limits   No results found.   1. Anxiety       MDM  30yo F w/ h/o anxiety presents w/ c/o increased severity and frequency of panic attacks x 2 days.  Minimal if any relief w/ xanax.  Has also had dry mouth for 2  days.   Exam sig for mild tachycardia.  Suspect that patient is experiencing panic attacks but some concern for symptomatic tachyarrhythmia.  EKG and chem 8 pending.  If normal, will refer back to PCP for further eval and tmnt.         Otilio Miu, [place] 03/28/12 2352

## 2012-03-30 NOTE — ED Provider Notes (Signed)
Medical screening examination/treatment/procedure(s) were performed by non-physician practitioner and as supervising physician I was immediately available for consultation/collaboration.   Gavin Pound. Dean Goldner, MD 03/30/12 1007

## 2012-04-14 ENCOUNTER — Emergency Department (HOSPITAL_COMMUNITY)
Admission: EM | Admit: 2012-04-14 | Discharge: 2012-04-14 | Disposition: A | Discharge status: Home / Self Care | Payer: Medicaid Other | Attending: Emergency Medicine | Admitting: Emergency Medicine

## 2012-04-14 ENCOUNTER — Emergency Department (HOSPITAL_COMMUNITY): Payer: Medicaid Other

## 2012-04-14 DIAGNOSIS — E669 Obesity, unspecified: Secondary | ICD-10-CM | POA: Insufficient documentation

## 2012-04-14 DIAGNOSIS — J111 Influenza due to unidentified influenza virus with other respiratory manifestations: Secondary | ICD-10-CM | POA: Insufficient documentation

## 2012-04-14 DIAGNOSIS — I10 Essential (primary) hypertension: Secondary | ICD-10-CM | POA: Insufficient documentation

## 2012-04-14 IMAGING — CR DG CHEST 2V
2 series · 2 of 2 positions shown · non-contrast
Comparison: 10/12/2011

CLINICAL DATA: Cough

CHEST - 2 VIEW

[w chest pa]
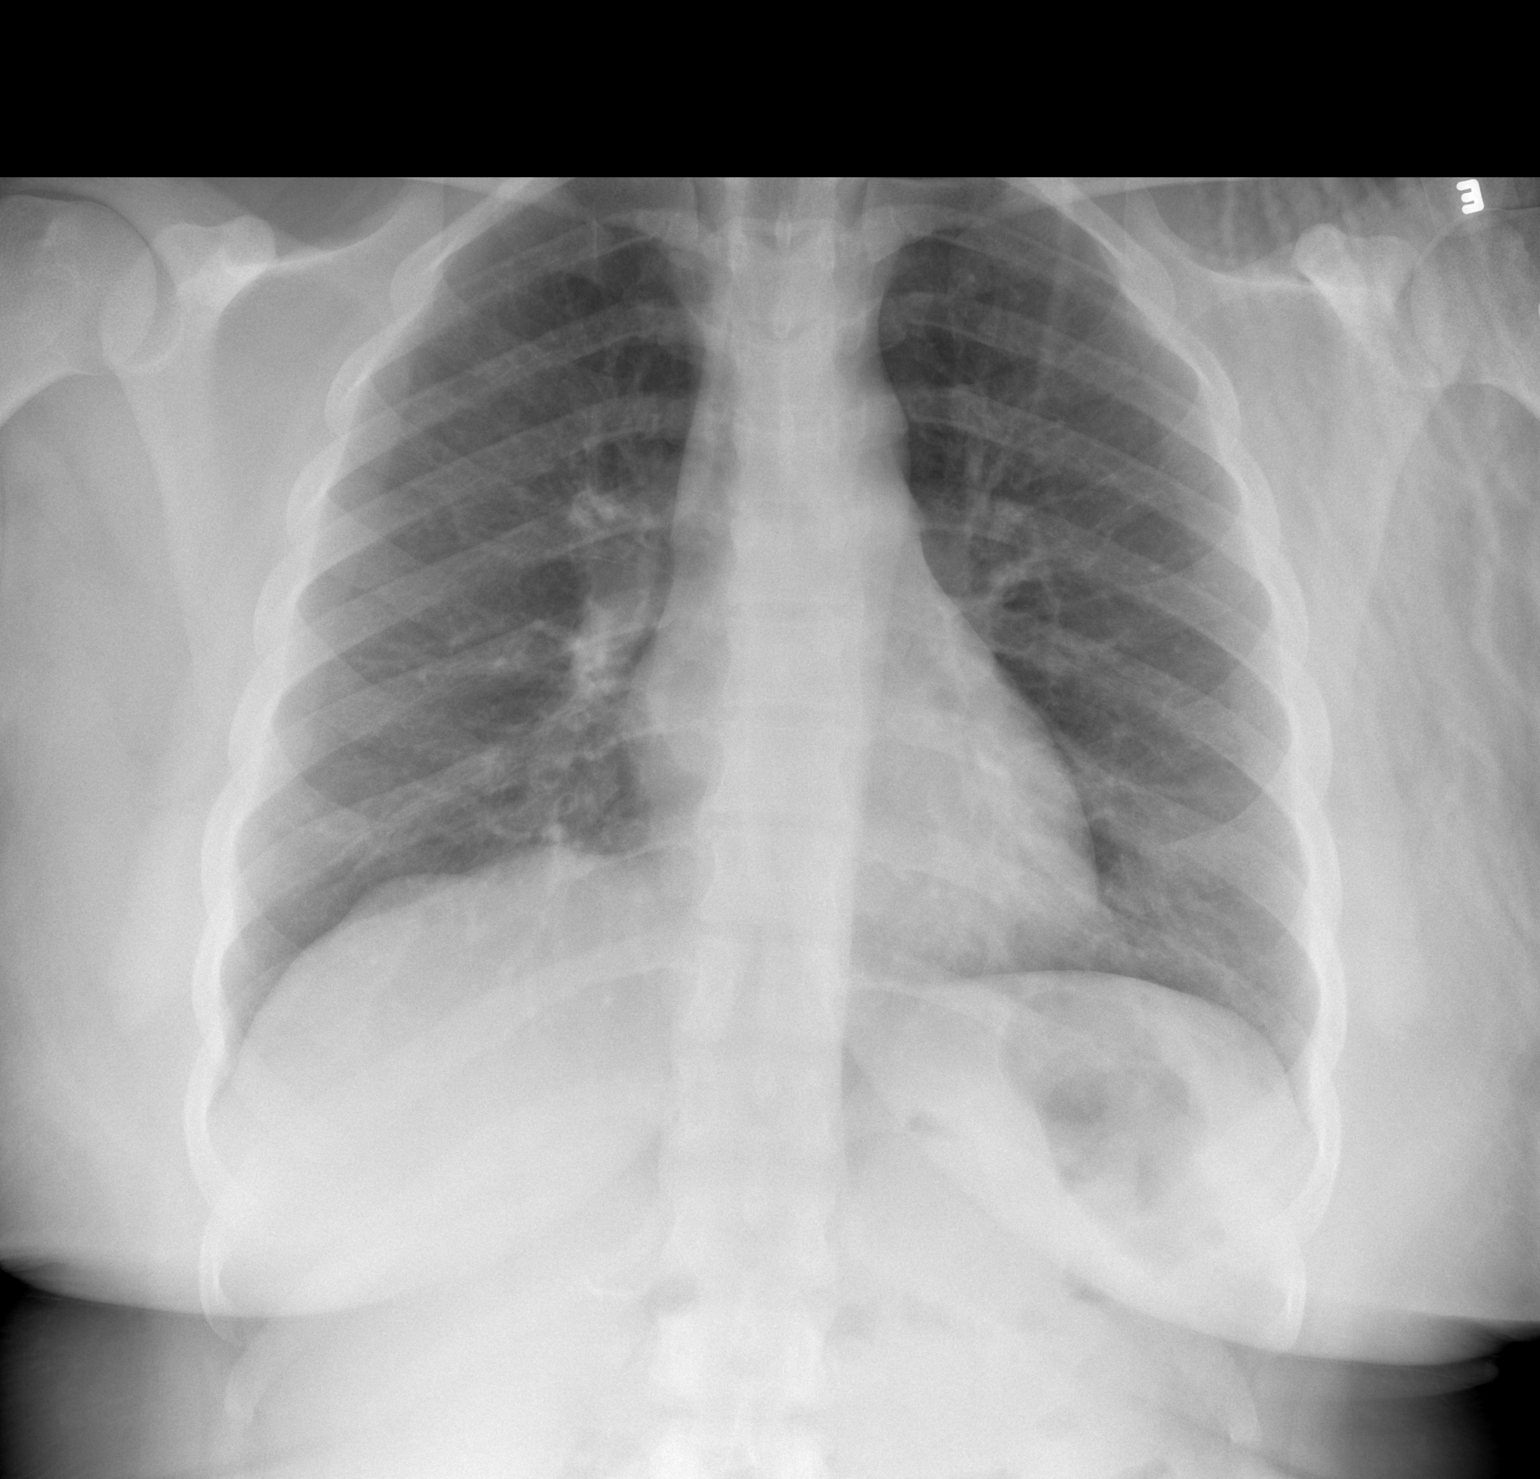

[w chest lat]
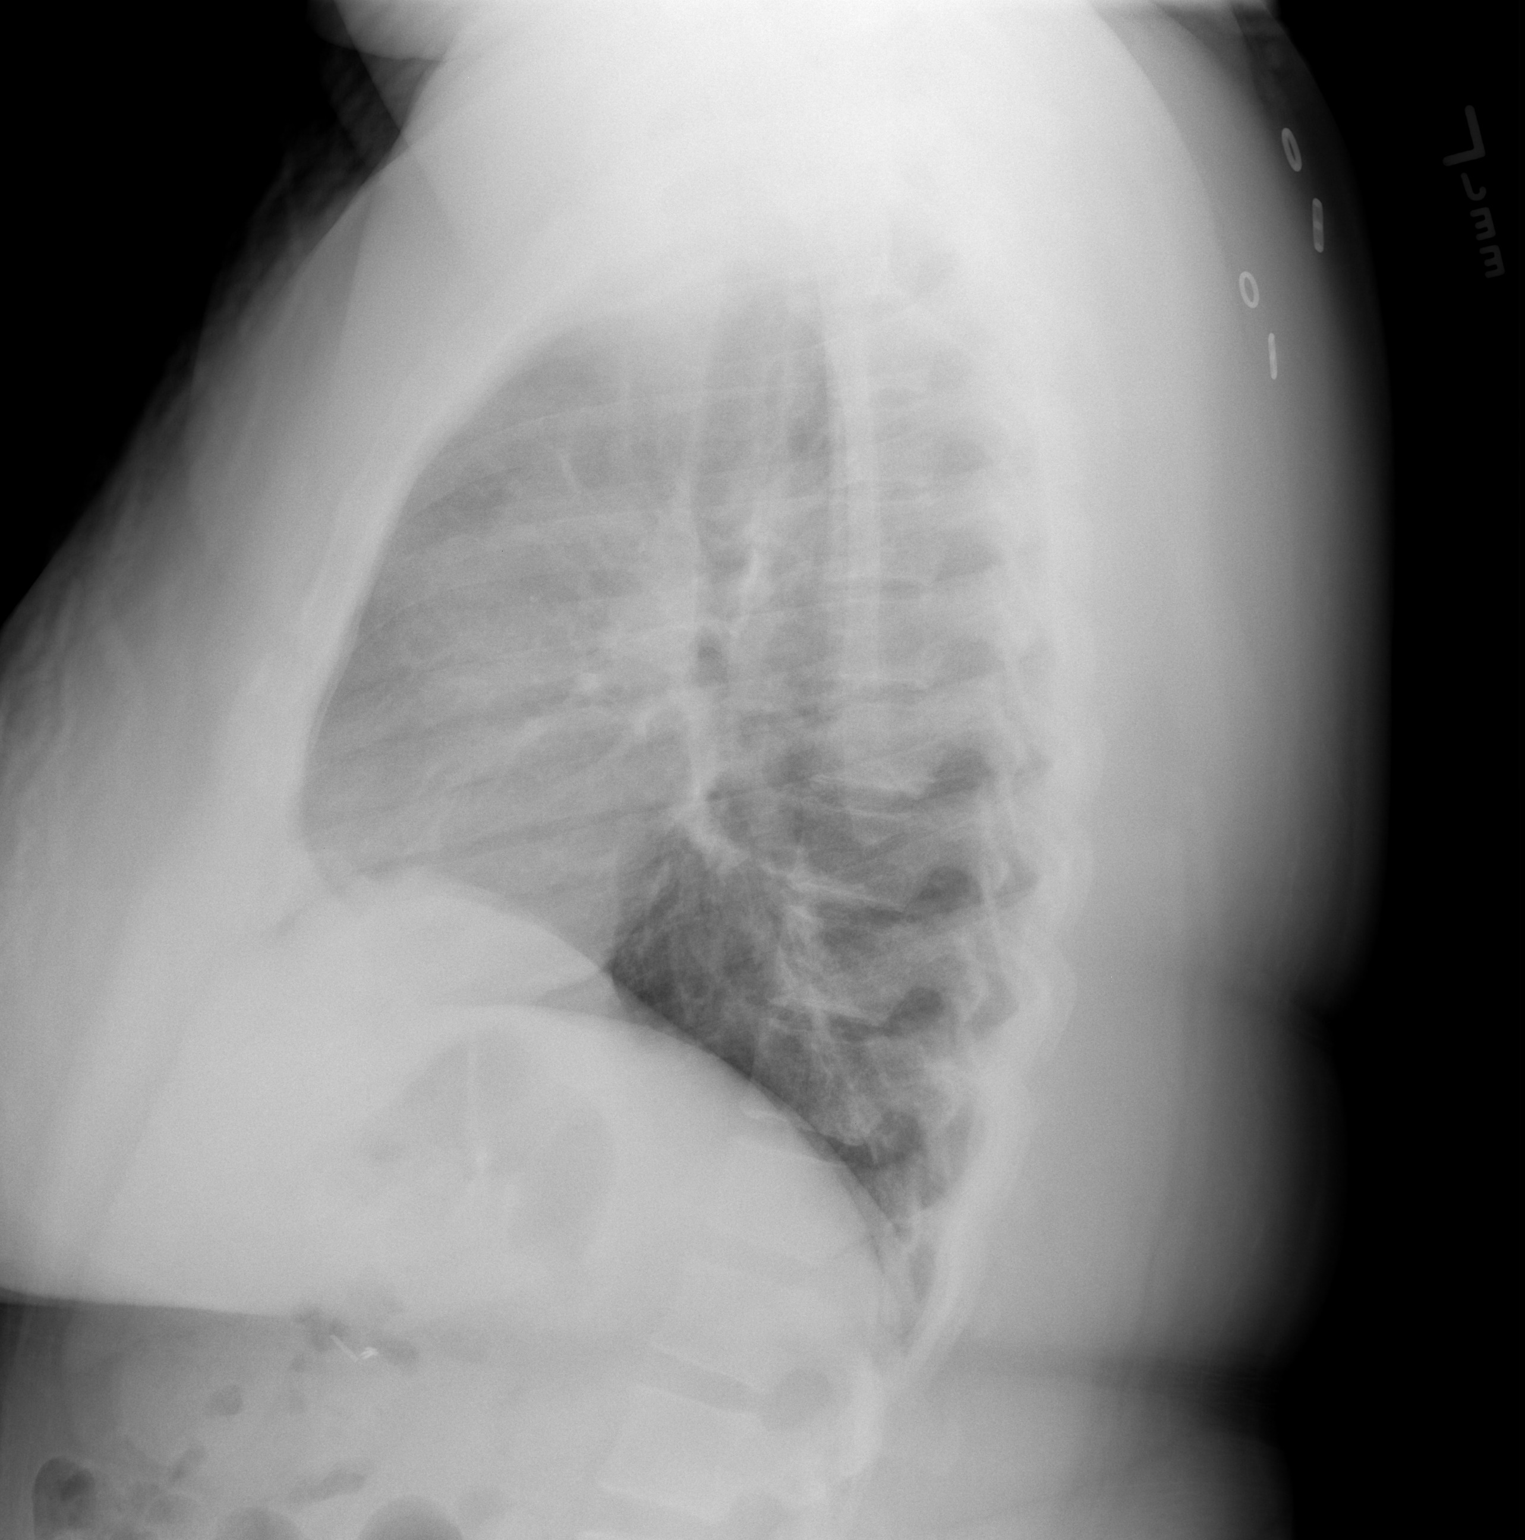

[2 of 2 positions shown; findings below may reference images not displayed]

FINDINGS: Cardiomediastinal silhouette is stable.  No acute
infiltrate or pleural effusion.  No pulmonary edema.  Bony thorax
is unremarkable.
IMPRESSION: No active disease.

## 2012-04-14 MED ORDER — PREDNISONE 20 MG PO TABS: ORAL_TABLET | ORAL | Status: DC

## 2012-04-14 MED ORDER — ALBUTEROL SULFATE HFA 108 (90 BASE) MCG/ACT IN AERS: 2.0000 | INHALATION_SPRAY | RESPIRATORY_TRACT | Status: DC | PRN

## 2012-04-14 NOTE — ED Notes (Signed)
Pt reports cough x 2 days with yellow sputum. Denies fever. Reports headache. States has taken hydrocodone syrup (pt had from previous illness), took it last night which helped pt sleep.

## 2012-04-14 NOTE — ED Provider Notes (Signed)
History   This chart was scribed for Hurman Horn, MD by Gerlean Ren, ED Scribe. This patient was seen in room TR06C/TR06C and the patient's care was started at 5:45 PM    CSN: [id number]  Arrival date & time 04/14/12  1414   First MD Initiated Contact with Patient 04/14/12 1738      Chief Complaint  Patient presents with  . Influenza    The history is provided by the patient. No language interpreter was used.   Robin Davis is a 31 y.o. female with h/o HTN who presents to the Emergency Department complaining of 2 days of cold-like fever, chills, cough producing yellow phlegm, myalgias, fatigue, congestion, sore throat, and diarrhea.  Denies nausea, emesis, abdominal pain, urinary symptoms, vaginal bleeding, rash, and confusion.  Pt reports she has been taking oral fluids well.  Pt denies h/o wheezing.  Pt denies tobacco use but reports occasional alcohol use.   Past Medical History  Diagnosis Date  . Hypertension   . Foot pain   . Obesity     Past Surgical History  Procedure Date  . Cholecystectomy   . Breast reduction surgery     No family history on file.  History  Substance Use Topics  . Smoking status: Never Smoker   . Smokeless tobacco: Not on file  . Alcohol Use: Yes     Comment: 4 times a year    OB History    Grav Para Term Preterm Abortions TAB SAB Ect Mult Living   5 3 2 1 2     2       Review of Systems 10 Systems reviewed and are negative for acute change except as noted in the HPI.  Allergies  Fish allergy; Macrobid; Flagyl; and Vicodin  Home Medications   Current Outpatient Rx  Name  Route  Sig  Dispense  Refill  . ALBUTEROL SULFATE HFA 108 (90 BASE) MCG/ACT IN AERS   Inhalation   Inhale 2 puffs into the lungs every 6 (six) hours as needed. For shortness of breath         . VITAMIN C PO   Oral   Take 1 tablet by mouth daily.         . GUAIFENESIN ER 600 MG PO TB12   Oral   Take 1,200 mg by mouth 2 (two) times daily as needed.  For cough/congestion         . HYDROCODONE-HOMATROPINE 5-1.5 MG/5ML PO SYRP   Oral   Take 5 mLs by mouth every 6 (six) hours as needed. For cough         . LISINOPRIL-HYDROCHLOROTHIAZIDE 10-12.5 MG PO TABS   Oral   Take 1 tablet by mouth daily.         . ALBUTEROL SULFATE HFA 108 (90 BASE) MCG/ACT IN AERS   Inhalation   Inhale 2 puffs into the lungs every 2 (two) hours as needed for wheezing or shortness of breath (cough).   1 Inhaler   0   . PREDNISONE 20 MG PO TABS      3 tabs po day one, then 2 po daily x 4 days   11 tablet   0     BP 119/68  Pulse 124  Temp 98.5 F (36.9 C) (Oral)  Resp 19  SpO2 96%  LMP 03/14/2012  Physical Exam  Nursing note and vitals reviewed. Constitutional:       Awake, alert, nontoxic appearance with baseline speech for patient.  HENT:  Head: Atraumatic.  Mouth/Throat: Oropharynx is clear and moist. No oropharyngeal exudate.  Eyes: EOM are normal. Pupils are equal, round, and reactive to light. Right eye exhibits no discharge. Left eye exhibits no discharge.  Neck: Neck supple.  Cardiovascular: Regular rhythm.   No murmur heard.      Tachycardic.  Pulmonary/Chest: Effort normal and breath sounds normal. No stridor. No respiratory distress. She has no wheezes. She has no rales. She exhibits no tenderness.       Few crackles at the right base and few scattered end expiratory wheezes.  Abdominal: Soft. Bowel sounds are normal. She exhibits no mass. There is no tenderness. There is no rebound.  Musculoskeletal: She exhibits no tenderness.       Baseline ROM, moves extremities with no obvious new focal weakness.  Diffuse tenderness to arms, legs, and back.  Lymphadenopathy:    She has no cervical adenopathy.  Neurological:       Awake, alert, cooperative and aware of situation; motor strength bilaterally; sensation normal to light touch bilaterally; peripheral visual fields full to confrontation; no facial asymmetry; tongue midline;  major cranial nerves appear intact; no pronator drift, normal finger to nose bilaterally, baseline gait without new ataxia.  Skin: No rash noted.  Psychiatric: She has a normal mood and affect.    ED Course  Procedures (including critical care time) DIAGNOSTIC STUDIES: Oxygen Saturation is 96% on room air, adequate by my interpretation.    COORDINATION OF CARE: 5:50 PM- Had to leave pt's room to deal with emergent situation. 6:11 PM- Patient informed of clinical course, understands medical decision-making process, and agrees with plan.  Pt states she is taking oral fluids well and does not need IV fluids.  Labs Reviewed - No data to display Dg Chest 2 View  04/14/2012  *RADIOLOGY REPORT*  Clinical Data: Cough  CHEST - 2 VIEW  Comparison: 10/12/2011  Findings: Cardiomediastinal silhouette is stable.  No acute infiltrate or pleural effusion.  No pulmonary edema.  Bony thorax is unremarkable.  IMPRESSION: No active disease.   Original Report Authenticated By: Natasha Mead, M.D.      1. Influenza       MDM  Pt stable in ED with no significant deterioration in condition.  Patient / Family / Caregiver informed of clinical course, understand medical decision-making process, and agree with plan.  I doubt any other EMC precluding discharge at this time including, but not necessarily limited to the following:sepsis.  I personally performed the services described in this documentation, which was scribed in my presence. The recorded information has been reviewed and is accurate.        Hurman Horn, MD 04/19/12 [phone]

## 2012-04-14 NOTE — ED Notes (Signed)
Pt reports having bilateral flank pain radiating into her upper back when she coughs.  States that she has been coughing up yellow sputum  2 days.  Denies fever.  Reports diarrhea, denies N/V.

## 2012-08-04 ENCOUNTER — Emergency Department (HOSPITAL_COMMUNITY)
Admission: EM | Admit: 2012-08-04 | Discharge: 2012-08-04 | Disposition: A | Discharge status: Home / Self Care | Payer: Medicaid Other | Source: Home / Self Care

## 2012-08-04 ENCOUNTER — Encounter (HOSPITAL_COMMUNITY): Payer: Self-pay

## 2012-08-04 DIAGNOSIS — J029 Acute pharyngitis, unspecified: Secondary | ICD-10-CM

## 2012-08-04 LAB — POCT RAPID STREP A: Streptococcus, Group A Screen (Direct): NEGATIVE

## 2012-08-04 NOTE — ED Provider Notes (Signed)
Medical screening examination/treatment/procedure(s) were performed by resident physician or non-physician practitioner and as supervising physician I was immediately available for consultation/collaboration.   Barkley Bruns MD.   Linna Hoff, MD 08/04/12 [phone]

## 2012-08-04 NOTE — ED Provider Notes (Signed)
History     CSN: [id number]  Arrival date & time 08/04/12  Paulo Fruit   First MD Initiated Contact with Patient 08/04/12 1853      Chief Complaint  Patient presents with  . Sore Throat    (Consider location/radiation/quality/duration/timing/severity/associated sxs/prior treatment) HPI Comments: 32 y.o. F with HTN p/w sore throat since yesterday.  Started as scratchy throat yesterday morning, got progressively more painful throughout the day to the point where it was keeping her up last night.  Feels like a brillo pad is in her throat.  Pain with swallowing and breathing. No CP, SOB, rhinorrhea, congestion, N/V/D. Mild cough.  No reflux symptoms. No throat tightness or swelling, no rashes.  Aleve has made it feel better.  Hot and cold liquids help.  Of note, she did have oral sex a few nights ago with her fiance and did not gargle with Listerine afterwards like she usually does. She is not concerned for STI and does not want to be tested today.   Patient is a 32 y.o. female presenting with pharyngitis. The history is provided by the patient. No language interpreter was used.  Sore Throat This is a new problem. The current episode started yesterday. The problem occurs constantly. The problem has not changed since onset.Pertinent negatives include no chest pain, no abdominal pain, no headaches and no shortness of breath. The symptoms are aggravated by eating and swallowing. The symptoms are relieved by NSAIDs.    Past Medical History  Diagnosis Date  . Hypertension   . Foot pain   . Obesity     Past Surgical History  Procedure Laterality Date  . Cholecystectomy    . Breast reduction surgery      History reviewed. No pertinent family history.  History  Substance Use Topics  . Smoking status: Never Smoker   . Smokeless tobacco: Not on file  . Alcohol Use: Yes     Comment: 4 times a year    OB History   Grav Para Term Preterm Abortions TAB SAB Ect Mult Living   5 3 2 1 2     2        Review of Systems  Constitutional: Negative for fever and chills.  HENT: Positive for ear pain, sore throat and voice change. Negative for congestion, rhinorrhea, sneezing, drooling, mouth sores, trouble swallowing, neck pain, neck stiffness, sinus pressure and tinnitus.   Respiratory: Positive for cough. Negative for chest tightness and shortness of breath.   Cardiovascular: Negative for chest pain.  Gastrointestinal: Negative for nausea, vomiting, abdominal pain and diarrhea.  Genitourinary: Negative for dysuria and vaginal discharge.  Skin: Negative for rash.  Neurological: Negative for headaches.    Allergies  Fish allergy; Macrobid; Flagyl; and Vicodin  Home Medications   Current Outpatient Rx  Name  Route  Sig  Dispense  Refill  . lisinopril-hydrochlorothiazide (PRINZIDE,ZESTORETIC) 10-12.5 MG per tablet   Oral   Take 1 tablet by mouth daily.         . phentermine 15 MG capsule   Oral   Take 15 mg by mouth every morning.         Marland Kitchen albuterol (PROVENTIL HFA;VENTOLIN HFA) 108 (90 BASE) MCG/ACT inhaler   Inhalation   Inhale 2 puffs into the lungs every 6 (six) hours as needed. For shortness of breath         . albuterol (PROVENTIL HFA;VENTOLIN HFA) 108 (90 BASE) MCG/ACT inhaler   Inhalation   Inhale 2 puffs into the lungs every  2 (two) hours as needed for wheezing or shortness of breath (cough).   1 Inhaler   0   . Ascorbic Acid (VITAMIN C PO)   Oral   Take 1 tablet by mouth daily.         Marland Kitchen guaiFENesin (MUCINEX) 600 MG 12 hr tablet   Oral   Take 1,200 mg by mouth 2 (two) times daily as needed. For cough/congestion         . HYDROcodone-homatropine (HYCODAN) 5-1.5 MG/5ML syrup   Oral   Take 5 mLs by mouth every 6 (six) hours as needed. For cough         . predniSONE (DELTASONE) 20 MG tablet      3 tabs po day one, then 2 po daily x 4 days   11 tablet   0     BP 127/56  Pulse 92  Temp(Src) 97.9 F (36.6 C) (Oral)  Resp 18  SpO2  100%  Physical Exam  Constitutional: She is oriented to person, place, and time. She appears well-developed and well-nourished. No distress.  HENT:  Head: Normocephalic and atraumatic.  Right Ear: External ear normal.  Left Ear: External ear normal.  Nose: Nose normal.  Mouth/Throat: Mucous membranes are normal. Posterior oropharyngeal erythema present. No oropharyngeal exudate, posterior oropharyngeal edema or tonsillar abscesses.  Neck: Normal range of motion. Neck supple.  Cardiovascular: Normal rate, regular rhythm, normal heart sounds and intact distal pulses.   No murmur heard. Pulmonary/Chest: Effort normal and breath sounds normal. No respiratory distress. She has no wheezes. She has no rales.  Abdominal: Soft. There is no tenderness.  Musculoskeletal: Normal range of motion.  Lymphadenopathy:    She has no cervical adenopathy.  Neurological: She is alert and oriented to person, place, and time.  Skin: Skin is warm and dry. No rash noted.    ED Course  Procedures (including critical care time)  Labs Reviewed  POCT RAPID STREP A ([facility] URG CARE ONLY)   No results found.   1. Viral pharyngitis       MDM  Rapid strep negative; likely viral etiology.  Supportive/symptomatic care only. Counseled patient that if not improving to return; may need to have pharyngeal swabs for GC/Chlam pharyngitis.         Tito Dine, MD 08/04/12 2048

## 2012-08-04 NOTE — ED Notes (Signed)
Frequent strep infections as child ; c/o cough, ST

## 2013-02-02 DIAGNOSIS — H53149 Visual discomfort, unspecified: Secondary | ICD-10-CM | POA: Insufficient documentation

## 2013-02-02 DIAGNOSIS — Z79899 Other long term (current) drug therapy: Secondary | ICD-10-CM | POA: Insufficient documentation

## 2013-02-02 DIAGNOSIS — E669 Obesity, unspecified: Secondary | ICD-10-CM | POA: Insufficient documentation

## 2013-02-02 DIAGNOSIS — J329 Chronic sinusitis, unspecified: Secondary | ICD-10-CM | POA: Insufficient documentation

## 2013-02-02 DIAGNOSIS — I10 Essential (primary) hypertension: Secondary | ICD-10-CM | POA: Insufficient documentation

## 2013-02-03 ENCOUNTER — Emergency Department (HOSPITAL_COMMUNITY)
Admission: EM | Admit: 2013-02-03 | Discharge: 2013-02-03 | Disposition: A | Discharge status: Home / Self Care | Payer: Medicaid Other | Attending: Emergency Medicine | Admitting: Emergency Medicine

## 2013-02-03 ENCOUNTER — Encounter (HOSPITAL_COMMUNITY): Payer: Self-pay

## 2013-02-03 DIAGNOSIS — J329 Chronic sinusitis, unspecified: Secondary | ICD-10-CM

## 2013-02-03 MED ORDER — ACETAMINOPHEN 500 MG PO TABS
1000.0000 mg | ORAL_TABLET | Freq: Once | ORAL | Status: AC
  Administered 2013-02-03: 1000 mg via ORAL
  Filled 2013-02-03: qty 2

## 2013-02-03 NOTE — ED Notes (Signed)
Pt reports she ran out of her HTN medication 3-4 days ago, she has no more refills on her prescription and has not followed up with her PCP. Pt c/o headache, feels nauseous, eyes bulging, and facial tightness starting today

## 2013-02-03 NOTE — ED Provider Notes (Signed)
CSN: [id number]     Arrival date & time 02/02/13  2351 History   First MD Initiated Contact with Patient 02/03/13 [phone]     Chief Complaint  Patient presents with  . Medication Refill  . Headache   (Consider location/radiation/quality/duration/timing/severity/associated sxs/prior Treatment) Patient is a 32 y.o. female presenting with headaches.  Headache Pain location:  Frontal (right) Quality: Pressure. Radiates to:  Does not radiate Pain severity now: Moderate. Onset quality:  Gradual Duration:  1 day Timing:  Constant Progression:  Unchanged Chronicity:  New Context: not coughing   Context comment:  Ran out of blood pressure medication 4 days ago, is worried that her headache is from high blood pressure. Relieved by: Benadryl. Worsened by:  Light Associated symptoms: photophobia   Associated symptoms: no abdominal pain, no blurred vision, no congestion, no cough, no diarrhea, no fever, no nausea, no numbness, no vomiting and no weakness     Past Medical History  Diagnosis Date  . Hypertension   . Foot pain   . Obesity    Past Surgical History  Procedure Laterality Date  . Cholecystectomy    . Breast reduction surgery     History reviewed. No pertinent family history. History  Substance Use Topics  . Smoking status: Never Smoker   . Smokeless tobacco: Not on file  . Alcohol Use: Yes     Comment: 4 times a year   OB History   Grav Para Term Preterm Abortions TAB SAB Ect Mult Living   5 3 2 1 2     2      Review of Systems  Constitutional: Negative for fever.  HENT: Negative for congestion.   Eyes: Positive for photophobia. Negative for blurred vision.  Respiratory: Negative for cough and shortness of breath.   Cardiovascular: Negative for chest pain.  Gastrointestinal: Negative for nausea, vomiting, abdominal pain and diarrhea.  Neurological: Positive for headaches. Negative for numbness.  All other systems reviewed and are negative.    Allergies  Fish  allergy; Macrobid; Flagyl; and Vicodin  Home Medications   Current Outpatient Rx  Name  Route  Sig  Dispense  Refill  . albuterol (PROVENTIL HFA;VENTOLIN HFA) 108 (90 BASE) MCG/ACT inhaler   Inhalation   Inhale 2 puffs into the lungs every 6 (six) hours as needed. For shortness of breath         . albuterol (PROVENTIL HFA;VENTOLIN HFA) 108 (90 BASE) MCG/ACT inhaler   Inhalation   Inhale 2 puffs into the lungs every 2 (two) hours as needed for wheezing or shortness of breath (cough).   1 Inhaler   0   . Ascorbic Acid (VITAMIN C PO)   Oral   Take 1 tablet by mouth daily.         Marland Kitchen guaiFENesin (MUCINEX) 600 MG 12 hr tablet   Oral   Take 1,200 mg by mouth 2 (two) times daily as needed. For cough/congestion         . HYDROcodone-homatropine (HYCODAN) 5-1.5 MG/5ML syrup   Oral   Take 5 mLs by mouth every 6 (six) hours as needed. For cough         . lisinopril-hydrochlorothiazide (PRINZIDE,ZESTORETIC) 10-12.5 MG per tablet   Oral   Take 1 tablet by mouth daily.         . phentermine 15 MG capsule   Oral   Take 15 mg by mouth every morning.         . predniSONE (DELTASONE) 20 MG tablet  3 tabs po day one, then 2 po daily x 4 days   11 tablet   0    BP 117/74  Pulse 91  Temp(Src) 98.7 F (37.1 C) (Oral)  Resp 16  SpO2 93% Physical Exam  Nursing note and vitals reviewed. Constitutional: She is oriented to person, place, and time. She appears well-developed and well-nourished. No distress.  HENT:  Head: Normocephalic and atraumatic.  Nose: Right sinus exhibits frontal sinus tenderness. Right sinus exhibits no maxillary sinus tenderness. Left sinus exhibits no maxillary sinus tenderness and no frontal sinus tenderness.  Mouth/Throat: Oropharynx is clear and moist.  Eyes: Conjunctivae are normal. Pupils are equal, round, and reactive to light. No scleral icterus.  Fundoscopic exam:      The right eye shows no papilledema.       The left eye shows no  papilledema.  Neck: Neck supple.  Cardiovascular: Normal rate, regular rhythm, normal heart sounds and intact distal pulses.   No murmur heard. Pulmonary/Chest: Effort normal and breath sounds normal. No stridor. No respiratory distress. She has no rales.  Abdominal: Soft. Bowel sounds are normal. She exhibits no distension. There is no tenderness.  Musculoskeletal: Normal range of motion.  Neurological: She is alert and oriented to person, place, and time. She has normal strength. No cranial nerve deficit or sensory deficit. Coordination normal. GCS eye subscore is 4. GCS verbal subscore is 5. GCS motor subscore is 6.  Skin: Skin is warm and dry. No rash noted.  Psychiatric: She has a normal mood and affect. Her behavior is normal.    ED Course  Procedures (including critical care time) Labs Review Labs Reviewed - No data to display Imaging Review No results found.  MDM   1. Sinusitis    32 year old female with headache starting today in the setting of running out of her blood pressure medication. Headache seems most consistent with right frontal sinusitis. Not consistent with subarachnoid hemorrhage or meningitis. Her blood pressures are normal on my exam. She states that concern of her blood pressure was the primary reason for coming to the emergency department. She thinks she can get her lisinopril refilled tomorrow by her primary doctor. Will give Tylenol for sinus pain.    Candyce Churn, MD 02/03/13 [phone]

## 2013-03-10 ENCOUNTER — Encounter (HOSPITAL_COMMUNITY): Payer: Self-pay | Admitting: Emergency Medicine

## 2013-03-10 ENCOUNTER — Emergency Department (HOSPITAL_COMMUNITY)
Admission: EM | Admit: 2013-03-10 | Discharge: 2013-03-10 | Disposition: A | Discharge status: Home / Self Care | Payer: Medicaid Other | Attending: Emergency Medicine | Admitting: Emergency Medicine

## 2013-03-10 DIAGNOSIS — Z79899 Other long term (current) drug therapy: Secondary | ICD-10-CM | POA: Insufficient documentation

## 2013-03-10 DIAGNOSIS — I10 Essential (primary) hypertension: Secondary | ICD-10-CM | POA: Insufficient documentation

## 2013-03-10 DIAGNOSIS — E669 Obesity, unspecified: Secondary | ICD-10-CM | POA: Insufficient documentation

## 2013-03-10 DIAGNOSIS — R109 Unspecified abdominal pain: Secondary | ICD-10-CM | POA: Insufficient documentation

## 2013-03-10 DIAGNOSIS — J029 Acute pharyngitis, unspecified: Secondary | ICD-10-CM | POA: Insufficient documentation

## 2013-03-10 DIAGNOSIS — Z3202 Encounter for pregnancy test, result negative: Secondary | ICD-10-CM | POA: Insufficient documentation

## 2013-03-10 LAB — POCT PREGNANCY, URINE: Preg Test, Ur: NEGATIVE

## 2013-03-10 LAB — URINALYSIS, ROUTINE W REFLEX MICROSCOPIC
Bilirubin Urine: NEGATIVE
Nitrite: NEGATIVE
Specific Gravity, Urine: 1.028 (ref 1.005–1.030)
Urobilinogen, UA: 1 mg/dL (ref 0.0–1.0)

## 2013-03-10 MED ORDER — ORPHENADRINE CITRATE ER 100 MG PO TB12: 100.0000 mg | ORAL_TABLET | Freq: Two times a day (BID) | ORAL | Status: DC

## 2013-03-10 MED ORDER — IBUPROFEN 800 MG PO TABS
800.0000 mg | ORAL_TABLET | Freq: Once | ORAL | Status: AC
  Administered 2013-03-10: 800 mg via ORAL
  Filled 2013-03-10: qty 1

## 2013-03-10 MED ORDER — NAPROXEN 500 MG PO TABS: 500.0000 mg | ORAL_TABLET | Freq: Two times a day (BID) | ORAL | Status: DC

## 2013-03-10 MED ORDER — OXYCODONE-ACETAMINOPHEN 5-325 MG PO TABS: 1.0000 | ORAL_TABLET | ORAL | Status: DC | PRN

## 2013-03-10 MED ORDER — DEXAMETHASONE 6 MG PO TABS
12.0000 mg | ORAL_TABLET | Freq: Once | ORAL | Status: AC
  Administered 2013-03-10: 12 mg via ORAL
  Filled 2013-03-10: qty 2

## 2013-03-10 NOTE — ED Notes (Signed)
The pt is unable to give urine specimen at this time. The patient has been advised to use call light for restroom. The RN in charge is aware.

## 2013-03-10 NOTE — ED Notes (Signed)
Pt c/o mouth pain sleeping  When not disturbed.  Family at the bedside

## 2013-03-10 NOTE — ED Notes (Signed)
Unable to give urine specimen at this time .  

## 2013-03-10 NOTE — ED Notes (Signed)
Pt. reports intermittent left flank pain onset yesterday , denies injury , no hematuria or dysuria .

## 2013-03-10 NOTE — ED Provider Notes (Signed)
CSN: [id number]     Arrival date & time 03/10/13  0157 History   First MD Initiated Contact with Patient 03/10/13 [phone]     Chief Complaint  Patient presents with  . Flank Pain   (Consider location/radiation/quality/duration/timing/severity/associated sxs/prior Treatment) Patient is a 32 y.o. female presenting with flank pain. The history is provided by the patient.  Flank Pain  She complains of a sharp pain in her left flank area for about the last 36 hours. pain is severe and she rates it a 9/10. It is worse with movement and worse with lying on that side. She denies any trauma. She denies any unusual bending or lifting. She denies fever or chills. She denies urinary difficulty. She denies nausea or vomiting. She has not taken any medication to try and help it. She's also complaining of pain in her throat on the left side. When she swallows, pain is worse and radiates to her left year. She denies rhinorrhea or cough.  Past Medical History  Diagnosis Date  . Hypertension   . Foot pain   . Obesity    Past Surgical History  Procedure Laterality Date  . Cholecystectomy    . Breast reduction surgery     No family history on file. History  Substance Use Topics  . Smoking status: Never Smoker   . Smokeless tobacco: Not on file  . Alcohol Use: Yes     Comment: 4 times a year   OB History   Grav Para Term Preterm Abortions TAB SAB Ect Mult Living   5 3 2 1 2     2      Review of Systems  Genitourinary: Positive for flank pain.  All other systems reviewed and are negative.    Allergies  Fish allergy; Macrobid; Flagyl; and Vicodin  Home Medications   Current Outpatient Rx  Name  Route  Sig  Dispense  Refill  . albuterol (PROVENTIL HFA;VENTOLIN HFA) 108 (90 BASE) MCG/ACT inhaler   Inhalation   Inhale 2 puffs into the lungs every 6 (six) hours as needed. For shortness of breath         . albuterol (PROVENTIL HFA;VENTOLIN HFA) 108 (90 BASE) MCG/ACT inhaler   Inhalation  Inhale 2 puffs into the lungs every 2 (two) hours as needed for wheezing or shortness of breath (cough).   1 Inhaler   0   . Ascorbic Acid (VITAMIN C PO)   Oral   Take 1 tablet by mouth daily.         Marland Kitchen guaiFENesin (MUCINEX) 600 MG 12 hr tablet   Oral   Take 1,200 mg by mouth 2 (two) times daily as needed. For cough/congestion         . HYDROcodone-homatropine (HYCODAN) 5-1.5 MG/5ML syrup   Oral   Take 5 mLs by mouth every 6 (six) hours as needed. For cough         . lisinopril-hydrochlorothiazide (PRINZIDE,ZESTORETIC) 10-12.5 MG per tablet   Oral   Take 1 tablet by mouth daily.         . phentermine 15 MG capsule   Oral   Take 15 mg by mouth every morning.         . predniSONE (DELTASONE) 20 MG tablet      3 tabs po day one, then 2 po daily x 4 days   11 tablet   0    BP 106/63  Pulse 98  Temp(Src) 97.8 F (36.6 C) (Oral)  Resp 22  SpO2 97% Physical Exam  Nursing note and vitals reviewed.  32 year old female, resting comfortably and in no acute distress. Vital signs are significant for tachypnea with respiratory rate of 22. Oxygen saturation is 97%, which is normal. Head is normocephalic and atraumatic. PERRLA, EOMI. Oropharynx shows mild erythema with mild tonsillar hypertrophy bilaterally. There is no exudate present. She has no difficulty with secretions and phonation is normal. TMs are clear. Neck is nontender and supple without adenopathy or JVD. Back is nontender in the midline. There is moderate left CVA tenderness. There is negative straight leg raise. Lungs are clear without rales, wheezes, or rhonchi. Chest is nontender. Heart has regular rate and rhythm without murmur. Abdomen is soft, flat, nontender without masses or hepatosplenomegaly and peristalsis is normoactive. Extremities have no cyanosis or edema, full range of motion is present. Skin is warm and dry without rash. Neurologic: Mental status is normal, cranial nerves are intact, there  are no motor or sensory deficits.  ED Course  Procedures (including critical care time) Labs Review Results for orders placed during the hospital encounter of 03/10/13  RAPID STREP SCREEN      Result Value Range   Streptococcus, Group A Screen (Direct) NEGATIVE  NEGATIVE  URINALYSIS, ROUTINE W REFLEX MICROSCOPIC      Result Value Range   Color, Urine YELLOW  YELLOW   APPearance CLOUDY (*) CLEAR   Specific Gravity, Urine 1.028  1.005 - 1.030   pH 5.5  5.0 - 8.0   Glucose, UA NEGATIVE  NEGATIVE mg/dL   Hgb urine dipstick NEGATIVE  NEGATIVE   Bilirubin Urine NEGATIVE  NEGATIVE   Ketones, ur NEGATIVE  NEGATIVE mg/dL   Protein, ur NEGATIVE  NEGATIVE mg/dL   Urobilinogen, UA 1.0  0.0 - 1.0 mg/dL   Nitrite NEGATIVE  NEGATIVE   Leukocytes, UA NEGATIVE  NEGATIVE  POCT PREGNANCY, URINE      Result Value Range   Preg Test, Ur NEGATIVE  NEGATIVE   MDM  No diagnosis found. Left flank pain which seems musculoskeletal. Urinalysis will be sent to make sure she does not have UTI. If hematuria is present, will consider getting CT scan to evaluate for possible ureteral calculus. Strep screen will be obtained because of the throat complaints and she's given a dose of ibuprofen at a dose of dexamethasone. Old records are reviewed and she does have prior ED visit for viral pharyngitis. CT scan on 02/04/2012 showed no renal calculi.  Urinalysis is unremarkable. Strep screen is negative. She got significant relief of pain with ibuprofen. She'll be treated for musculoskeletal back pain. She's given prescription for naproxen, orphenadrine, and oxycodone-acetaminophen.  Dione Booze, MD 03/10/13 [phone]

## 2013-03-12 LAB — CULTURE, GROUP A STREP

## 2013-04-17 ENCOUNTER — Encounter (HOSPITAL_COMMUNITY): Payer: Self-pay | Admitting: Emergency Medicine

## 2013-04-17 DIAGNOSIS — R509 Fever, unspecified: Secondary | ICD-10-CM | POA: Insufficient documentation

## 2013-04-17 DIAGNOSIS — F411 Generalized anxiety disorder: Secondary | ICD-10-CM | POA: Insufficient documentation

## 2013-04-17 DIAGNOSIS — R0602 Shortness of breath: Secondary | ICD-10-CM | POA: Insufficient documentation

## 2013-04-17 DIAGNOSIS — Z79899 Other long term (current) drug therapy: Secondary | ICD-10-CM | POA: Insufficient documentation

## 2013-04-17 DIAGNOSIS — R062 Wheezing: Secondary | ICD-10-CM | POA: Insufficient documentation

## 2013-04-17 DIAGNOSIS — E669 Obesity, unspecified: Secondary | ICD-10-CM | POA: Insufficient documentation

## 2013-04-17 DIAGNOSIS — J209 Acute bronchitis, unspecified: Secondary | ICD-10-CM | POA: Insufficient documentation

## 2013-04-17 DIAGNOSIS — I10 Essential (primary) hypertension: Secondary | ICD-10-CM | POA: Insufficient documentation

## 2013-04-17 DIAGNOSIS — Z881 Allergy status to other antibiotic agents status: Secondary | ICD-10-CM | POA: Insufficient documentation

## 2013-04-17 DIAGNOSIS — Z885 Allergy status to narcotic agent status: Secondary | ICD-10-CM | POA: Insufficient documentation

## 2013-04-17 LAB — POCT I-STAT TROPONIN I

## 2013-04-17 NOTE — ED Notes (Signed)
C/o sharp chest pain since waking up this morning.  Pain worse with deep inspiration and coughing.  Reports dry cough and sob.  Denies nausea and vomiting.

## 2013-04-18 ENCOUNTER — Emergency Department (HOSPITAL_COMMUNITY): Payer: Medicaid Other

## 2013-04-18 ENCOUNTER — Emergency Department (HOSPITAL_COMMUNITY)
Admission: EM | Admit: 2013-04-18 | Discharge: 2013-04-18 | Disposition: A | Discharge status: Home / Self Care | Payer: Medicaid Other | Attending: Emergency Medicine | Admitting: Emergency Medicine

## 2013-04-18 DIAGNOSIS — J40 Bronchitis, not specified as acute or chronic: Secondary | ICD-10-CM

## 2013-04-18 HISTORY — DX: Anxiety disorder, unspecified: F41.9

## 2013-04-18 LAB — BASIC METABOLIC PANEL
CO2: 27 mEq/L (ref 19–32)
Calcium: 9.4 mg/dL (ref 8.4–10.5)
Creatinine, Ser: 0.72 mg/dL (ref 0.50–1.10)
GFR calc non Af Amer: >90 mL/min (ref >90)
Glucose, Bld: 117 mg/dL — ABNORMAL HIGH (ref 70–99)

## 2013-04-18 LAB — CBC
HCT: 37.7 % (ref 36.0–46.0)
Hemoglobin: 13 g/dL (ref 12.0–15.0)
MCH: 28.3 pg (ref 26.0–34.0)
MCHC: 34.5 g/dL (ref 30.0–36.0)
MCV: 82 fL (ref 78.0–100.0)
RBC: 4.6 MIL/uL (ref 3.87–5.11)
RDW: 13.3 % (ref 11.5–15.5)

## 2013-04-18 IMAGING — CR DG CHEST 2V
3 series · 3 of 3 positions shown · non-contrast
Comparison: Chest radiograph in April 14, 2012

CLINICAL DATA: Cough, shortness of breath, fever.

EXAM:
CHEST  2 VIEW

[w chest pa (1 of 2)]
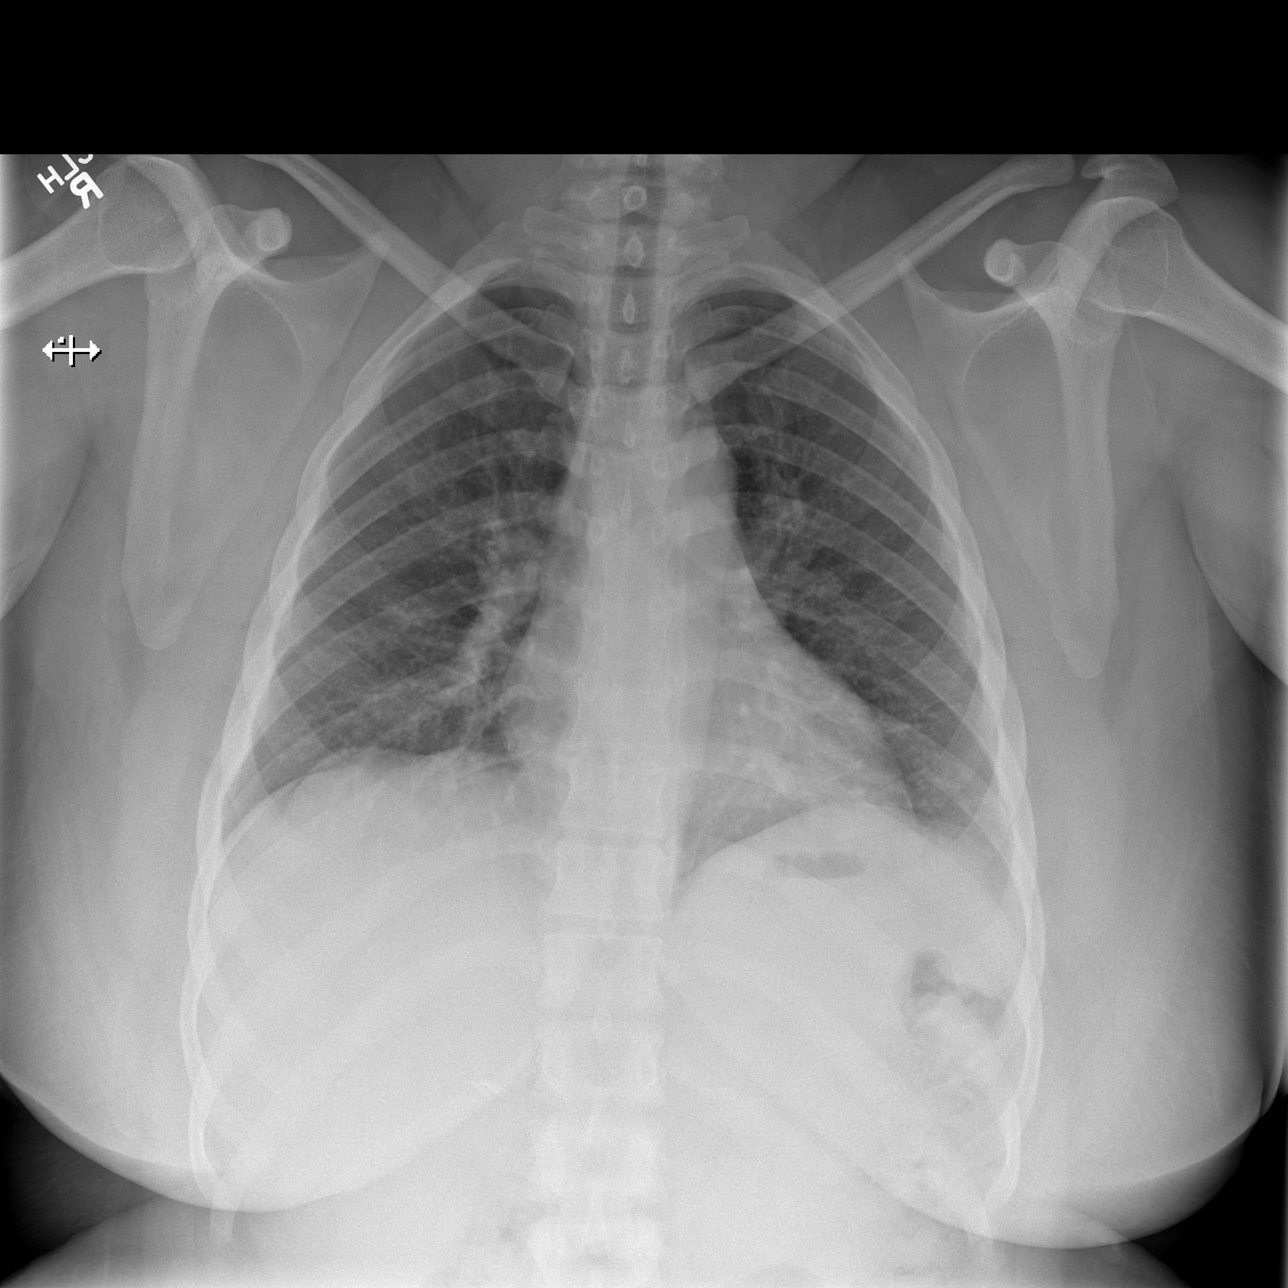

[w chest pa (2 of 2)]
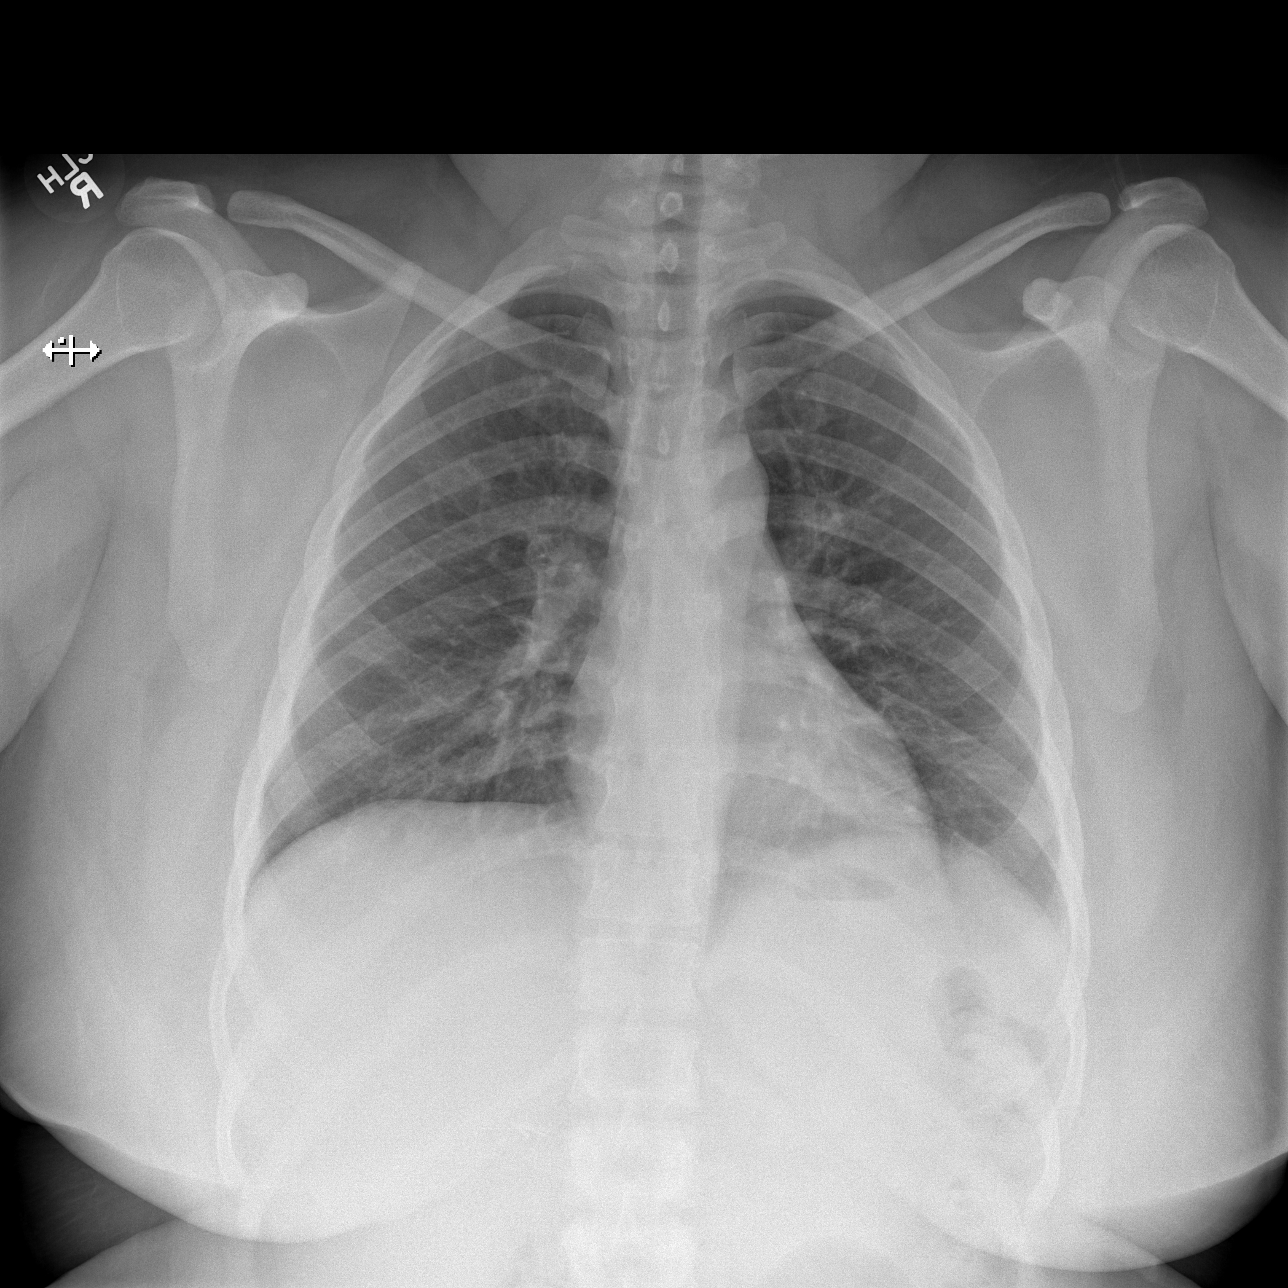

[w chest lat]
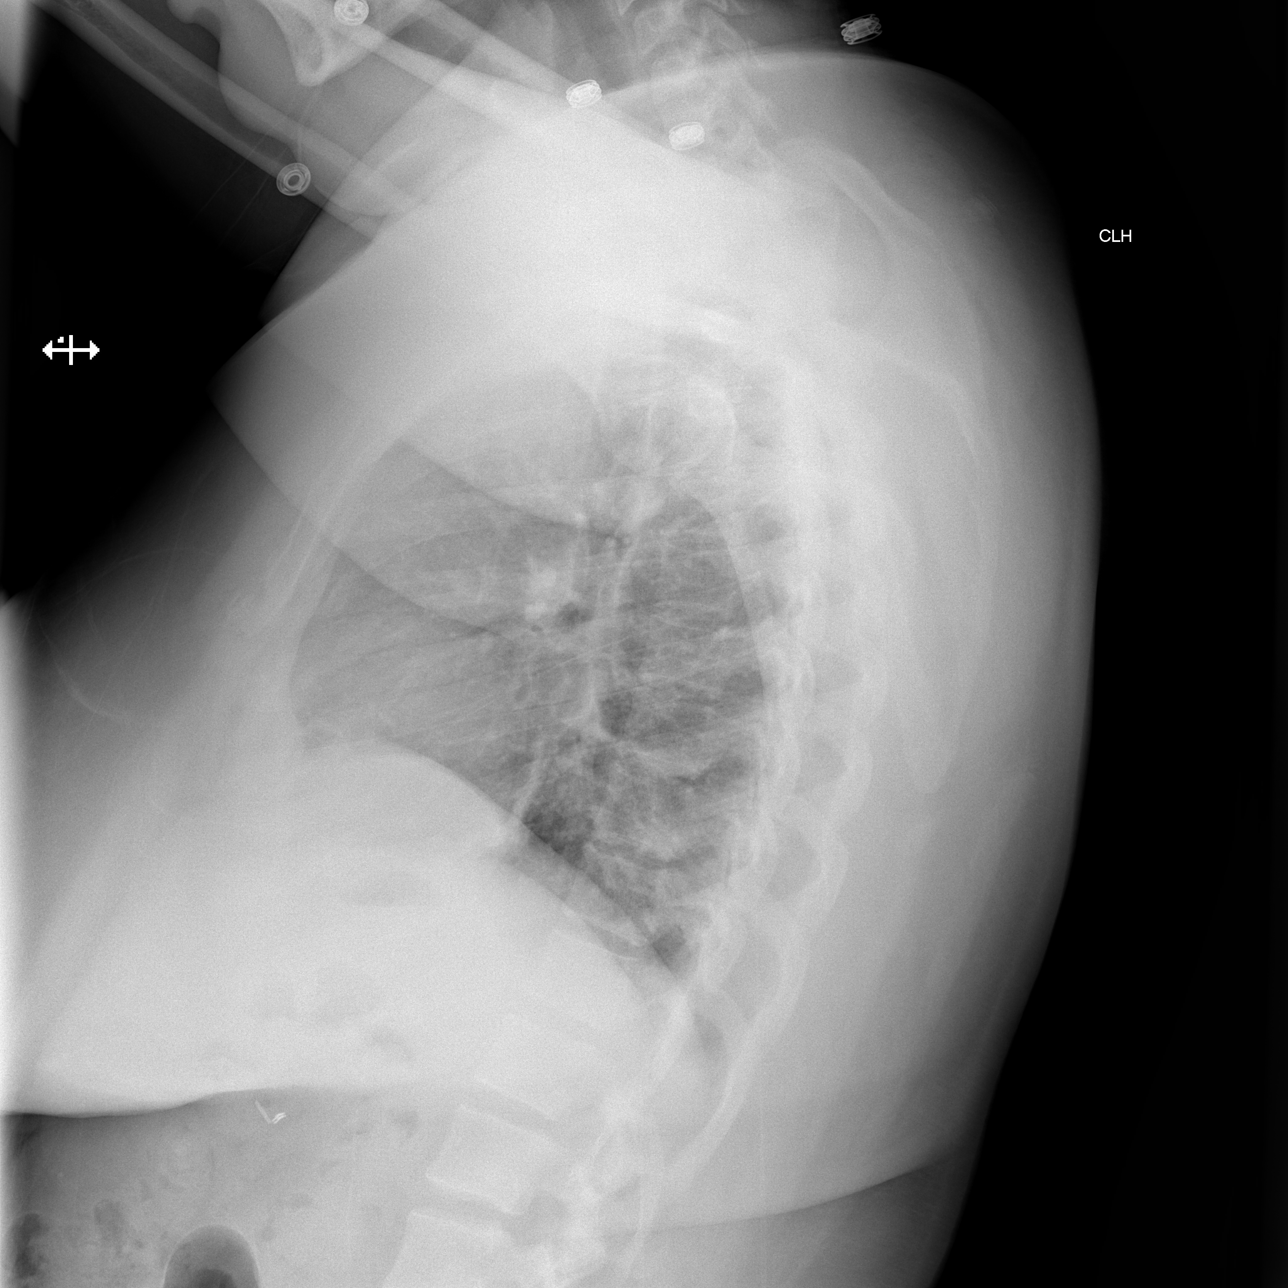

[3 of 3 positions shown; findings below may reference images not displayed]

FINDINGS: Cardiomediastinal silhouette is unremarkable. The lungs are clear
without pleural effusions or focal consolidations. Pulmonary
vasculature is unremarkable. Trachea projects midline and there is
no pneumothorax. Soft tissue planes and included osseous structures
are nonsuspicious. Surgical clips in the abdomen likely reflect
cholecystectomy.
IMPRESSION: No active cardiopulmonary disease.

  By: Goci Oubrecht

## 2013-04-18 MED ORDER — ALBUTEROL SULFATE (5 MG/ML) 0.5% IN NEBU
5.0000 mg | INHALATION_SOLUTION | Freq: Once | RESPIRATORY_TRACT | Status: AC
  Administered 2013-04-18: 5 mg via RESPIRATORY_TRACT
  Filled 2013-04-18: qty 1

## 2013-04-18 MED ORDER — IPRATROPIUM BROMIDE 0.02 % IN SOLN
0.5000 mg | Freq: Once | RESPIRATORY_TRACT | Status: AC
  Administered 2013-04-18: 0.5 mg via RESPIRATORY_TRACT
  Filled 2013-04-18: qty 2.5

## 2013-04-18 MED ORDER — NAPROXEN SODIUM 220 MG PO TABS: ORAL_TABLET | ORAL | Status: DC

## 2013-04-18 MED ORDER — ALBUTEROL SULFATE HFA 108 (90 BASE) MCG/ACT IN AERS
2.0000 | INHALATION_SPRAY | RESPIRATORY_TRACT | Status: DC | PRN
  Filled 2013-04-18: qty 6.7

## 2013-04-18 MED ORDER — NAPROXEN 250 MG PO TABS
500.0000 mg | ORAL_TABLET | Freq: Once | ORAL | Status: AC
  Administered 2013-04-18: 500 mg via ORAL
  Filled 2013-04-18: qty 2

## 2013-04-18 NOTE — ED Provider Notes (Signed)
CSN: [id number]     Arrival date & time 04/17/13  2302 History   First MD Initiated Contact with Patient 04/18/13 0158     Chief Complaint  Patient presents with  . Chest Pain   (Consider location/radiation/quality/duration/timing/severity/associated sxs/prior Treatment) HPI This is a 32 year old female with a two-day history of chest pain, shortness of breath, cough and subjective fever. The chest pain is sharp in nature and located primarily in the right parasternal region it is worse with movement or deep breathing. She was wheezing earlier and taking deep breaths causes her to cough. She denies nausea, vomiting or diarrhea. She denies nasal congestion or sore throat. Her symptoms are moderate. She has an albuterol inhaler but states it is empty and therefore ineffective.  Past Medical History  Diagnosis Date  . Hypertension   . Foot pain   . Obesity   . Anxiety    Past Surgical History  Procedure Laterality Date  . Cholecystectomy    . Breast reduction surgery     No family history on file. History  Substance Use Topics  . Smoking status: Never Smoker   . Smokeless tobacco: Not on file  . Alcohol Use: Yes     Comment: 4 times a year   OB History   Grav Para Term Preterm Abortions TAB SAB Ect Mult Living   5 3 2 1 2     2      Review of Systems  All other systems reviewed and are negative.    Allergies  Fish allergy; Macrobid; Flagyl; and Vicodin  Home Medications   Current Outpatient Rx  Name  Route  Sig  Dispense  Refill  . albuterol (PROVENTIL HFA;VENTOLIN HFA) 108 (90 BASE) MCG/ACT inhaler   Inhalation   Inhale 2 puffs into the lungs every 6 (six) hours as needed for wheezing or shortness of breath.          Marland Kitchen DM-Phenylephrine-Acetaminophen (ALKA-SELTZER PLS SINUS & COUGH PO)   Oral   Take 2 tablets by mouth at bedtime as needed (for cold symptom).         . Fexofenadine HCl (ALLEGRA PO)   Oral   Take 1 tablet by mouth daily as needed (for allergy  symptoms-OTC).         Marland Kitchen lisinopril-hydrochlorothiazide (PRINZIDE,ZESTORETIC) 10-12.5 MG per tablet   Oral   Take 1 tablet by mouth daily.          BP 158/93  Temp(Src) 98.9 F (37.2 C) (Oral)  Resp 18  SpO2 96%  LMP 03/28/2013  Physical Exam General: Well-developed, well-nourished female in no acute distress; appearance consistent with age of record HENT: normocephalic; atraumatic Eyes: pupils equal, round and reactive to light; extraocular muscles intact Neck: supple Heart: regular rate and rhythm Lungs: Decreased air movement without frank wheezing; shallow breaths with paroxysms of cough stimulated by deep breathing Chest: Right parasternal tenderness which reproduces the pain of chief complaint Abdomen: soft; nondistended; nontender; bowel sounds present Extremities: No deformity; full range of motion; pulses normal Neurologic: Awake, alert and oriented; motor function intact in all extremities and symmetric; no facial droop Skin: Warm and dry Psychiatric: Flat affect    ED Course  Procedures (including critical care time)  MDM   Nursing notes and vitals signs, including pulse oximetry, reviewed.  Summary of this visit's results, reviewed by myself:  Labs:  Results for orders placed during the hospital encounter of 04/18/13 (from the past 24 hour(s))  CBC     Status:  Abnormal   Collection Time    04/17/13 11:41 PM      Result Value Range   WBC 11.7 (*) 4.0 - 10.5 K/uL   RBC 4.60  3.87 - 5.11 MIL/uL   Hemoglobin 13.0  12.0 - 15.0 g/dL   HCT 16.1  09.6 - 04.5 %   MCV 82.0  78.0 - 100.0 fL   MCH 28.3  26.0 - 34.0 pg   MCHC 34.5  30.0 - 36.0 g/dL   RDW 40.9  81.1 - 91.4 %   Platelets 305  150 - 400 K/uL  BASIC METABOLIC PANEL     Status: Abnormal   Collection Time    04/17/13 11:41 PM      Result Value Range   Sodium 135  135 - 145 mEq/L   Potassium 3.5  3.5 - 5.1 mEq/L   Chloride 100  96 - 112 mEq/L   CO2 27  19 - 32 mEq/L   Glucose, Bld 117 (*) 70  - 99 mg/dL   BUN 7  6 - 23 mg/dL   Creatinine, Ser 7.82  0.50 - 1.10 mg/dL   Calcium 9.4  8.4 - 95.6 mg/dL   GFR calc non Af Amer >90  >90 mL/min   GFR calc Af Amer >90  >90 mL/min  PRO B NATRIURETIC PEPTIDE     Status: None   Collection Time    04/17/13 11:41 PM      Result Value Range   Pro B Natriuretic peptide (BNP) 37.5  0 - 125 pg/mL  POCT I-STAT TROPONIN I     Status: None   Collection Time    04/17/13 11:48 PM      Result Value Range   Troponin i, poc 0.00  0.00 - 0.08 ng/mL   Comment 3             Imaging Studies: Dg Chest 2 View  04/18/2013   CLINICAL DATA:  Cough, shortness of breath, fever.  EXAM: CHEST  2 VIEW  COMPARISON:  Chest radiograph in April 14, 2012  FINDINGS: Cardiomediastinal silhouette is unremarkable. The lungs are clear without pleural effusions or focal consolidations. Pulmonary vasculature is unremarkable. Trachea projects midline and there is no pneumothorax. Soft tissue planes and included osseous structures are nonsuspicious. Surgical clips in the abdomen likely reflect cholecystectomy.  IMPRESSION: No active cardiopulmonary disease.   Electronically Signed   By: Awilda Metro   On: 04/18/2013 00:16   3:41 AM Air movement improved significantly after albuterol and Atrovent neb treatment. Chest pain improved with naproxen.      Hanley Seamen, MD 04/18/13 [phone]

## 2014-02-19 DIAGNOSIS — E669 Obesity, unspecified: Secondary | ICD-10-CM | POA: Diagnosis not present

## 2014-02-19 DIAGNOSIS — I10 Essential (primary) hypertension: Secondary | ICD-10-CM | POA: Diagnosis not present

## 2014-02-20 ENCOUNTER — Emergency Department (HOSPITAL_COMMUNITY)
Admission: EM | Admit: 2014-02-20 | Discharge: 2014-02-20 | Discharge status: Against Medical Advice | Payer: Medicaid Other | Attending: Emergency Medicine | Admitting: Emergency Medicine

## 2014-02-20 NOTE — ED Notes (Signed)
Patient presents stating all she wanted was to have her BP checked.  Has been with her aunt who has been transferred to [place] after having a bleeding stroke.  She has been upset about her aunt and figured that is why her BP is up.  BP taken several times.  Left arm 142/85 and 126/84 and right arm 123/73  Patient stated she was going to go home and lay down.  Encouraged her to stay and she said she was only here to have her BP checked  Encouraged her to stay but she was going home.  Told her to come back if she started feeling worse.

## 2014-03-13 ENCOUNTER — Encounter (HOSPITAL_COMMUNITY): Payer: Self-pay | Admitting: Emergency Medicine

## 2014-04-15 ENCOUNTER — Emergency Department (HOSPITAL_COMMUNITY)
Admission: EM | Admit: 2014-04-15 | Discharge: 2014-04-15 | Disposition: A | Discharge status: Home / Self Care | Payer: Medicaid Other | Attending: Emergency Medicine | Admitting: Emergency Medicine

## 2014-04-15 ENCOUNTER — Encounter (HOSPITAL_COMMUNITY): Payer: Self-pay | Admitting: *Deleted

## 2014-04-15 DIAGNOSIS — E669 Obesity, unspecified: Secondary | ICD-10-CM | POA: Diagnosis not present

## 2014-04-15 DIAGNOSIS — R05 Cough: Secondary | ICD-10-CM | POA: Diagnosis present

## 2014-04-15 DIAGNOSIS — Z79899 Other long term (current) drug therapy: Secondary | ICD-10-CM | POA: Insufficient documentation

## 2014-04-15 DIAGNOSIS — R112 Nausea with vomiting, unspecified: Secondary | ICD-10-CM | POA: Insufficient documentation

## 2014-04-15 DIAGNOSIS — I10 Essential (primary) hypertension: Secondary | ICD-10-CM | POA: Insufficient documentation

## 2014-04-15 DIAGNOSIS — Z8659 Personal history of other mental and behavioral disorders: Secondary | ICD-10-CM | POA: Insufficient documentation

## 2014-04-15 DIAGNOSIS — J0120 Acute ethmoidal sinusitis, unspecified: Secondary | ICD-10-CM | POA: Insufficient documentation

## 2014-04-15 MED ORDER — ALBUTEROL SULFATE HFA 108 (90 BASE) MCG/ACT IN AERS: 1.0000 | INHALATION_SPRAY | Freq: Four times a day (QID) | RESPIRATORY_TRACT | Status: DC | PRN

## 2014-04-15 MED ORDER — AZITHROMYCIN 250 MG PO TABS: 250.0000 mg | ORAL_TABLET | Freq: Every day | ORAL | Status: DC

## 2014-04-15 NOTE — ED Notes (Signed)
PT reports cough started 4 days ago and is worse today. Pt reports cough keeps her awake at night.

## 2014-04-15 NOTE — Discharge Instructions (Signed)
Sinusitis °Sinusitis is redness, soreness, and puffiness (inflammation) of the air pockets in the bones of your face (sinuses). The redness, soreness, and puffiness can cause air and mucus to get trapped in your sinuses. This can allow germs to grow and cause an infection.  °HOME CARE  °· Drink enough fluids to keep your pee (urine) clear or pale yellow. °· Use a humidifier in your home. °· Run a hot shower to create steam in the bathroom. Sit in the bathroom with the door closed. Breathe in the steam 3-4 times a day. °· Put a warm, moist washcloth on your face 3-4 times a day, or as told by your doctor. °· Use salt water sprays (saline sprays) to wet the thick fluid in your nose. This can help the sinuses drain. °· Only take medicine as told by your doctor. °GET HELP RIGHT AWAY IF:  °· Your pain gets worse. °· You have very bad headaches. °· You are sick to your stomach (nauseous). °· You throw up (vomit). °· You are very sleepy (drowsy) all the time. °· Your face is puffy (swollen). °· Your vision changes. °· You have a stiff neck. °· You have trouble breathing. °MAKE SURE YOU:  °· Understand these instructions. °· Will watch your condition. °· Will get help right away if you are not doing well or get worse. °Document Released: 10/15/2007 Document Revised: 01/21/2012 Document Reviewed: 12/02/2011 °[employer]® Patient Information ©2015 [employer]. This information is not intended to replace advice given to you by your health care provider. Make sure you discuss any questions you have with your health care provider. ° °

## 2014-04-15 NOTE — ED Notes (Signed)
Declined W/C at D/C and was escorted to lobby by RN. 

## 2014-04-15 NOTE — ED Provider Notes (Signed)
CSN: [id number]     Arrival date & time 04/15/14  [id number] History  This chart was scribed for Elpidio AnisShari Yalda Herd, PA-C, working with Flint MelterElliott L Wentz, MD by Chestine SporeSoijett Blue, ED Scribe. The patient was seen in room TR06C/TR06C at 9:46 AM.      Chief Complaint  Patient presents with  . Cough     The history is provided by the patient. No language interpreter was used.   HPI Comments: Robin Davis is a 33 y.o. female with a hx of HTN who presents to the Emergency Department complaining of worsening constant dry cough onset 4 days. She has not slept since yesterday. She states that she is having associated symptoms of HA, vomiting, nausea. The HA is worse with coughing. The vomiting is because of the cough. She denies fever, congestion, rhinorrhea, and any other symptoms. Denies smoking or living with smokers. Has hx of asthma but does not have an inhaler.   Past Medical History  Diagnosis Date  . Hypertension   . Foot pain   . Obesity   . Anxiety    Past Surgical History  Procedure Laterality Date  . Cholecystectomy    . Breast reduction surgery     History reviewed. No pertinent family history. History  Substance Use Topics  . Smoking status: Never Smoker   . Smokeless tobacco: Not on file  . Alcohol Use: Yes     Comment: 4 times a year   OB History    Gravida Para Term Preterm AB TAB SAB Ectopic Multiple Living   5 3 2 1 2     2      Review of Systems  Constitutional: Negative for fever.  HENT: Negative for congestion and rhinorrhea.   Respiratory: Positive for cough.   Gastrointestinal: Positive for nausea and vomiting.  Neurological: Positive for headaches.      Allergies  Fish allergy; Macrobid; Flagyl; Tramadol; and Vicodin  Home Medications   Prior to Admission medications   Medication Sig Start Date End Date Taking? Authorizing Provider  DM-Phenylephrine-Acetaminophen (ALKA-SELTZER PLS SINUS & COUGH PO) Take 2 tablets by mouth at bedtime as needed (for cold  symptom).   Yes Historical Provider, MD  lisinopril-hydrochlorothiazide (PRINZIDE,ZESTORETIC) 10-12.5 MG per tablet Take 1 tablet by mouth daily.   Yes Historical Provider, MD  phentermine (ADIPEX-P) 37.5 MG tablet Take 37.5 mg by mouth daily. 03/13/14  Yes Historical Provider, MD  albuterol (PROVENTIL HFA;VENTOLIN HFA) 108 (90 BASE) MCG/ACT inhaler Inhale 2 puffs into the lungs every 6 (six) hours as needed for wheezing or shortness of breath.     Historical Provider, MD  Fexofenadine HCl (ALLEGRA PO) Take 1 tablet by mouth daily as needed (for allergy symptoms-OTC).    Historical Provider, MD  naproxen sodium (ALEVE) 220 MG tablet Take 2 tablets twice a day as needed for chest wall pain. 04/18/13   John L Molpus, MD   BP 132/84 mmHg  Pulse 93  Temp(Src) 97.8 F (36.6 C) (Oral)  Resp 20  Ht 5\' 2"  (1.575 m)  Wt 213 lb (96.616 kg)  BMI 38.95 kg/m2  SpO2 100%  LMP 04/15/2014  Physical Exam  Constitutional: She is oriented to person, place, and time. She appears well-developed and well-nourished. No distress.  HENT:  Head: Normocephalic and atraumatic.  Eyes: EOM are normal.  Neck: Neck supple. No tracheal deviation present.  Cardiovascular: Normal rate.   Pulmonary/Chest: Effort normal and breath sounds normal. No respiratory distress.  Abdominal: Soft. There is no tenderness.  Musculoskeletal: Normal range of motion.  Neurological: She is alert and oriented to person, place, and time.  Skin: Skin is warm and dry.  Psychiatric: She has a normal mood and affect. Her behavior is normal.  Nursing note and vitals reviewed.   ED Course  Procedures (including critical care time) DIAGNOSTIC STUDIES: Oxygen Saturation is 100% on room air, normal by my interpretation.    COORDINATION OF CARE: 9:49 AM-Discussed treatment plan which includes abx with pt at bedside and pt agreed to plan.   Labs Review Labs Reviewed - No data to display  Imaging Review No results found.   EKG  Interpretation None      MDM   Final diagnoses:  None    1. Sinusitis  Will treat symptoms c/w sinus inflammation and pain, cover with abx. Refer to PCP for recheck.   I personally performed the services described in this documentation, which was scribed in my presence. The recorded information has been reviewed and is accurate.     Arnoldo HookerShari A Sheera Illingworth, PA-C 04/16/14 2346  Flint MelterElliott L Wentz, MD 04/18/14 [phone]

## 2014-04-15 NOTE — ED Notes (Signed)
PT unable to stay awake during triage . Pt denies taking any meds. Pt reports unable to sleep at home. Pt needed multiple request to answer questions during triage.

## 2014-07-31 ENCOUNTER — Encounter (HOSPITAL_COMMUNITY): Payer: Self-pay | Admitting: Emergency Medicine

## 2014-07-31 ENCOUNTER — Emergency Department (INDEPENDENT_AMBULATORY_CARE_PROVIDER_SITE_OTHER)
Admission: EM | Admit: 2014-07-31 | Discharge: 2014-07-31 | Disposition: A | Discharge status: Home / Self Care | Payer: Medicaid Other | Source: Home / Self Care | Attending: Emergency Medicine | Admitting: Emergency Medicine

## 2014-07-31 DIAGNOSIS — J029 Acute pharyngitis, unspecified: Secondary | ICD-10-CM | POA: Diagnosis not present

## 2014-07-31 LAB — POCT RAPID STREP A: Streptococcus, Group A Screen (Direct): NEGATIVE

## 2014-07-31 MED ORDER — AMOXICILLIN 400 MG/5ML PO SUSR: 500.0000 mg | Freq: Two times a day (BID) | ORAL | Status: AC

## 2014-07-31 NOTE — Discharge Instructions (Signed)
You likely have a bacterial sore throat. Take amoxicillin twice a day for the next 10 days. You can use Chloraseptic spray, saltwater gargle, cough drops with honey to help relieve the pain. I would recommend doing a nightly saltwater gargle or vinegar water gargle to try to prevent future infections. Follow-up as needed.

## 2014-07-31 NOTE — ED Provider Notes (Signed)
CSN: [id number]     Arrival date & time 07/31/14  1944 History   First MD Initiated Contact with Patient 07/31/14 2104     Chief Complaint  Patient presents with  . Sore Throat   (Consider location/radiation/quality/duration/timing/severity/associated sxs/prior Treatment) HPI  She is a 34 year old woman here for evaluation of sore throat. She states this started today. It is right-sided and severe. Worse with swallowing. It is causing some right ear pain. Is associated with a headache. No fevers or chills. No nausea or vomiting. No runny nose or stuffiness. No cough. No known sick contacts. She reports a history of frequent bacterial pharyngitis.  Past Medical History  Diagnosis Date  . Hypertension   . Foot pain   . Obesity   . Anxiety    Past Surgical History  Procedure Laterality Date  . Cholecystectomy    . Breast reduction surgery     History reviewed. No pertinent family history. History  Substance Use Topics  . Smoking status: Never Smoker   . Smokeless tobacco: Not on file  . Alcohol Use: Yes     Comment: 4 times a year   OB History    Gravida Para Term Preterm AB TAB SAB Ectopic Multiple Living   Review of Systems  Constitutional: Negative for fever and chills.  HENT: Positive for ear pain and sore throat. Negative for congestion, postnasal drip and rhinorrhea.   Respiratory: Negative for cough.   Gastrointestinal: Negative for nausea and vomiting.    Allergies  Fish allergy; Macrobid; Flagyl; Tramadol; and Vicodin  Home Medications   Prior to Admission medications   Medication Sig Start Date End Date Taking? Authorizing Provider  lisinopril-hydrochlorothiazide (PRINZIDE,ZESTORETIC) 10-12.5 MG per tablet Take 1 tablet by mouth daily.   Yes Historical Provider, MD  phentermine (ADIPEX-P) 37.5 MG tablet Take 37.5 mg by mouth daily. 03/13/14  Yes Historical Provider, MD  albuterol (PROVENTIL HFA;VENTOLIN HFA) 108 (90 BASE) MCG/ACT inhaler  Inhale 1-2 puffs into the lungs every 6 (six) hours as needed for wheezing or shortness of breath. 04/15/14   Elpidio Anis, PA-C  amoxicillin (AMOXIL) 400 MG/5ML suspension Take 6.3 mLs (500 mg total) by mouth 2 (two) times daily. 07/31/14 08/07/14  Charm Rings, MD  DM-Phenylephrine-Acetaminophen (ALKA-SELTZER PLS SINUS & COUGH PO) Take 2 tablets by mouth at bedtime as needed (for cold symptom).    Historical Provider, MD  Fexofenadine HCl (ALLEGRA PO) Take 1 tablet by mouth daily as needed (for allergy symptoms-OTC).    Historical Provider, MD  naproxen sodium (ALEVE) 220 MG tablet Take 2 tablets twice a day as needed for chest wall pain. 04/18/13   John Molpus, MD   BP 141/92 mmHg  Pulse 76  Temp(Src) 97.9 F (36.6 C) (Oral)  Resp 16  SpO2 100%  LMP 07/31/2014 Physical Exam  Constitutional: She is oriented to person, place, and time. She appears well-developed and well-nourished. No distress.  HENT:  Head: Normocephalic and atraumatic.  Right Ear: External ear normal. Tympanic membrane is retracted.  Left Ear: Tympanic membrane and external ear normal.  Nose: Nose normal.  Mouth/Throat: Posterior oropharyngeal erythema present. No oropharyngeal exudate.  Neck: Neck supple.  Cardiovascular: Normal rate, regular rhythm and normal heart sounds.   No murmur heard. Pulmonary/Chest: Effort normal and breath sounds normal. No respiratory distress. She has no wheezes. She has no rales.  Lymphadenopathy:    She has no cervical adenopathy.  Neurological: She is alert and oriented to person, place, and time.    ED Course  Procedures (including critical care time) Labs Review Labs Reviewed  POCT RAPID STREP A ([facility] CARE ONLY)    Imaging Review No results found.   MDM   1. Pharyngitis    Rapid strep is negative, but her history is concerning for a bacterial cause. Throat culture sent. We'll treat with amoxicillin. Discussed symptomatic care as in after visit summary. Follow-up  as needed.    Charm RingsErin J Nadina Fomby, MD 07/31/14 2134

## 2014-07-31 NOTE — ED Notes (Signed)
Pt developed a severe sore throat today that is causing pain in her ears as well.  She denies any other symptoms or fever.

## 2014-08-02 ENCOUNTER — Emergency Department (HOSPITAL_COMMUNITY): Payer: Medicaid Other

## 2014-08-02 ENCOUNTER — Encounter (HOSPITAL_COMMUNITY): Payer: Self-pay | Admitting: Emergency Medicine

## 2014-08-02 ENCOUNTER — Emergency Department (HOSPITAL_COMMUNITY)
Admission: EM | Admit: 2014-08-02 | Discharge: 2014-08-03 | Disposition: A | Discharge status: Home / Self Care | Payer: Medicaid Other | Attending: Emergency Medicine | Admitting: Emergency Medicine

## 2014-08-02 DIAGNOSIS — Z79899 Other long term (current) drug therapy: Secondary | ICD-10-CM | POA: Diagnosis not present

## 2014-08-02 DIAGNOSIS — R079 Chest pain, unspecified: Secondary | ICD-10-CM | POA: Diagnosis present

## 2014-08-02 DIAGNOSIS — R000 Tachycardia, unspecified: Secondary | ICD-10-CM | POA: Diagnosis not present

## 2014-08-02 DIAGNOSIS — Z3202 Encounter for pregnancy test, result negative: Secondary | ICD-10-CM | POA: Insufficient documentation

## 2014-08-02 DIAGNOSIS — I10 Essential (primary) hypertension: Secondary | ICD-10-CM | POA: Insufficient documentation

## 2014-08-02 DIAGNOSIS — E669 Obesity, unspecified: Secondary | ICD-10-CM | POA: Insufficient documentation

## 2014-08-02 DIAGNOSIS — J189 Pneumonia, unspecified organism: Secondary | ICD-10-CM

## 2014-08-02 DIAGNOSIS — J159 Unspecified bacterial pneumonia: Secondary | ICD-10-CM | POA: Insufficient documentation

## 2014-08-02 DIAGNOSIS — Z8659 Personal history of other mental and behavioral disorders: Secondary | ICD-10-CM | POA: Insufficient documentation

## 2014-08-02 LAB — CBC
HEMATOCRIT: 40.2 % (ref 36.0–46.0)
Hemoglobin: 13.4 g/dL (ref 12.0–15.0)
MCH: 27.4 pg (ref 26.0–34.0)
MCHC: 33.3 g/dL (ref 30.0–36.0)
MCV: 82.2 fL (ref 78.0–100.0)
Platelets: 353 10*3/uL (ref 150–400)
RBC: 4.89 MIL/uL (ref 3.87–5.11)
RDW: 13.4 % (ref 11.5–15.5)
WBC: 9.2 10*3/uL (ref 4.0–10.5)

## 2014-08-02 LAB — BASIC METABOLIC PANEL
Anion gap: 7 (ref 5–15)
BUN: 7 mg/dL (ref 6–23)
CO2: 28 mmol/L (ref 19–32)
Calcium: 9.5 mg/dL (ref 8.4–10.5)
Chloride: 100 mmol/L (ref 96–112)
Creatinine, Ser: 0.78 mg/dL (ref 0.50–1.10)
GFR calc Af Amer: >90 mL/min (ref >90)
Glucose, Bld: 126 mg/dL — ABNORMAL HIGH (ref 70–99)
Potassium: 3.2 mmol/L — ABNORMAL LOW (ref 3.5–5.1)
Sodium: 135 mmol/L (ref 135–145)

## 2014-08-02 LAB — I-STAT TROPONIN, ED: TROPONIN I, POC: 0 ng/mL (ref 0.00–0.08)

## 2014-08-02 LAB — POC URINE PREG, ED: Preg Test, Ur: NEGATIVE

## 2014-08-02 IMAGING — CR DG CHEST 2V
2 series · 2 of 2 positions shown · non-contrast
Comparison: 04/18/2013

CLINICAL DATA: Mid chest pain radiating to the back for 4 days.
Shortness of breath. Cough for 1 day.

EXAM:
CHEST  2 VIEW

[chest pa]
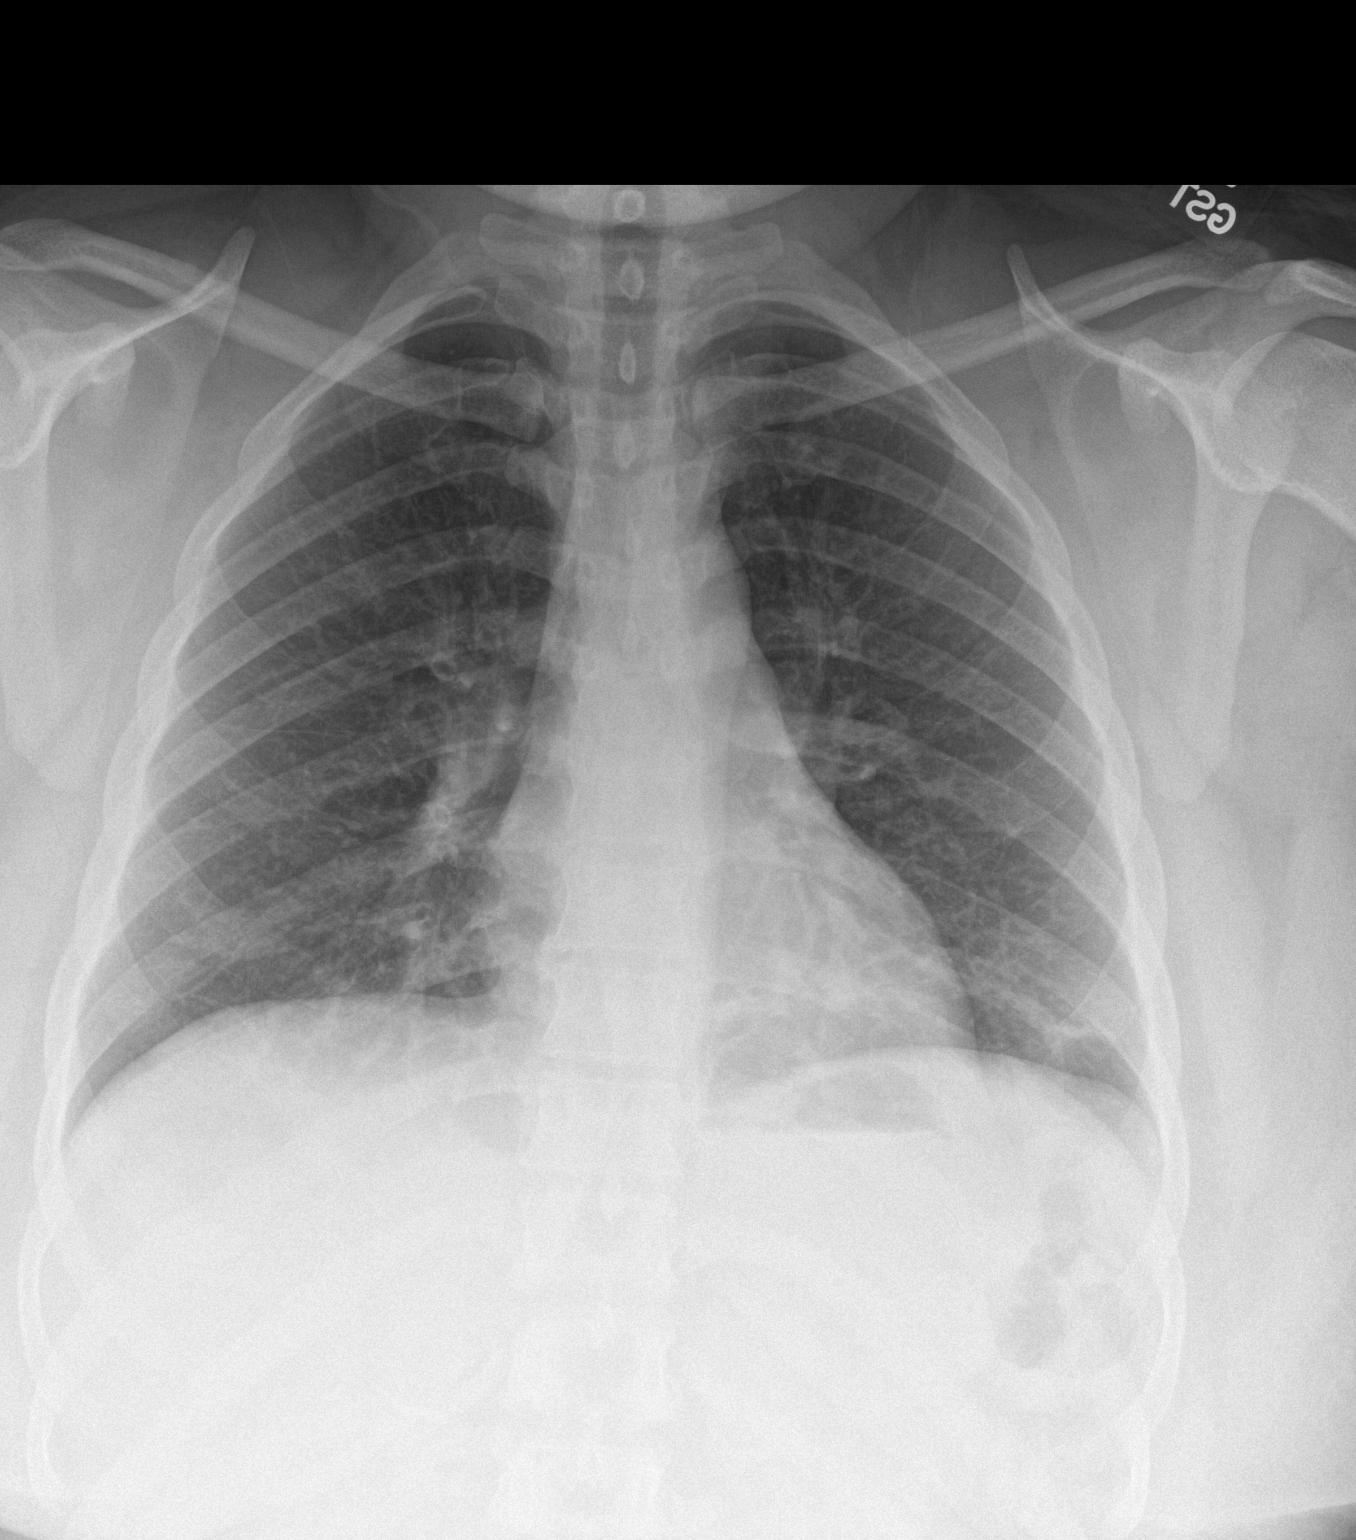

[chest lat]
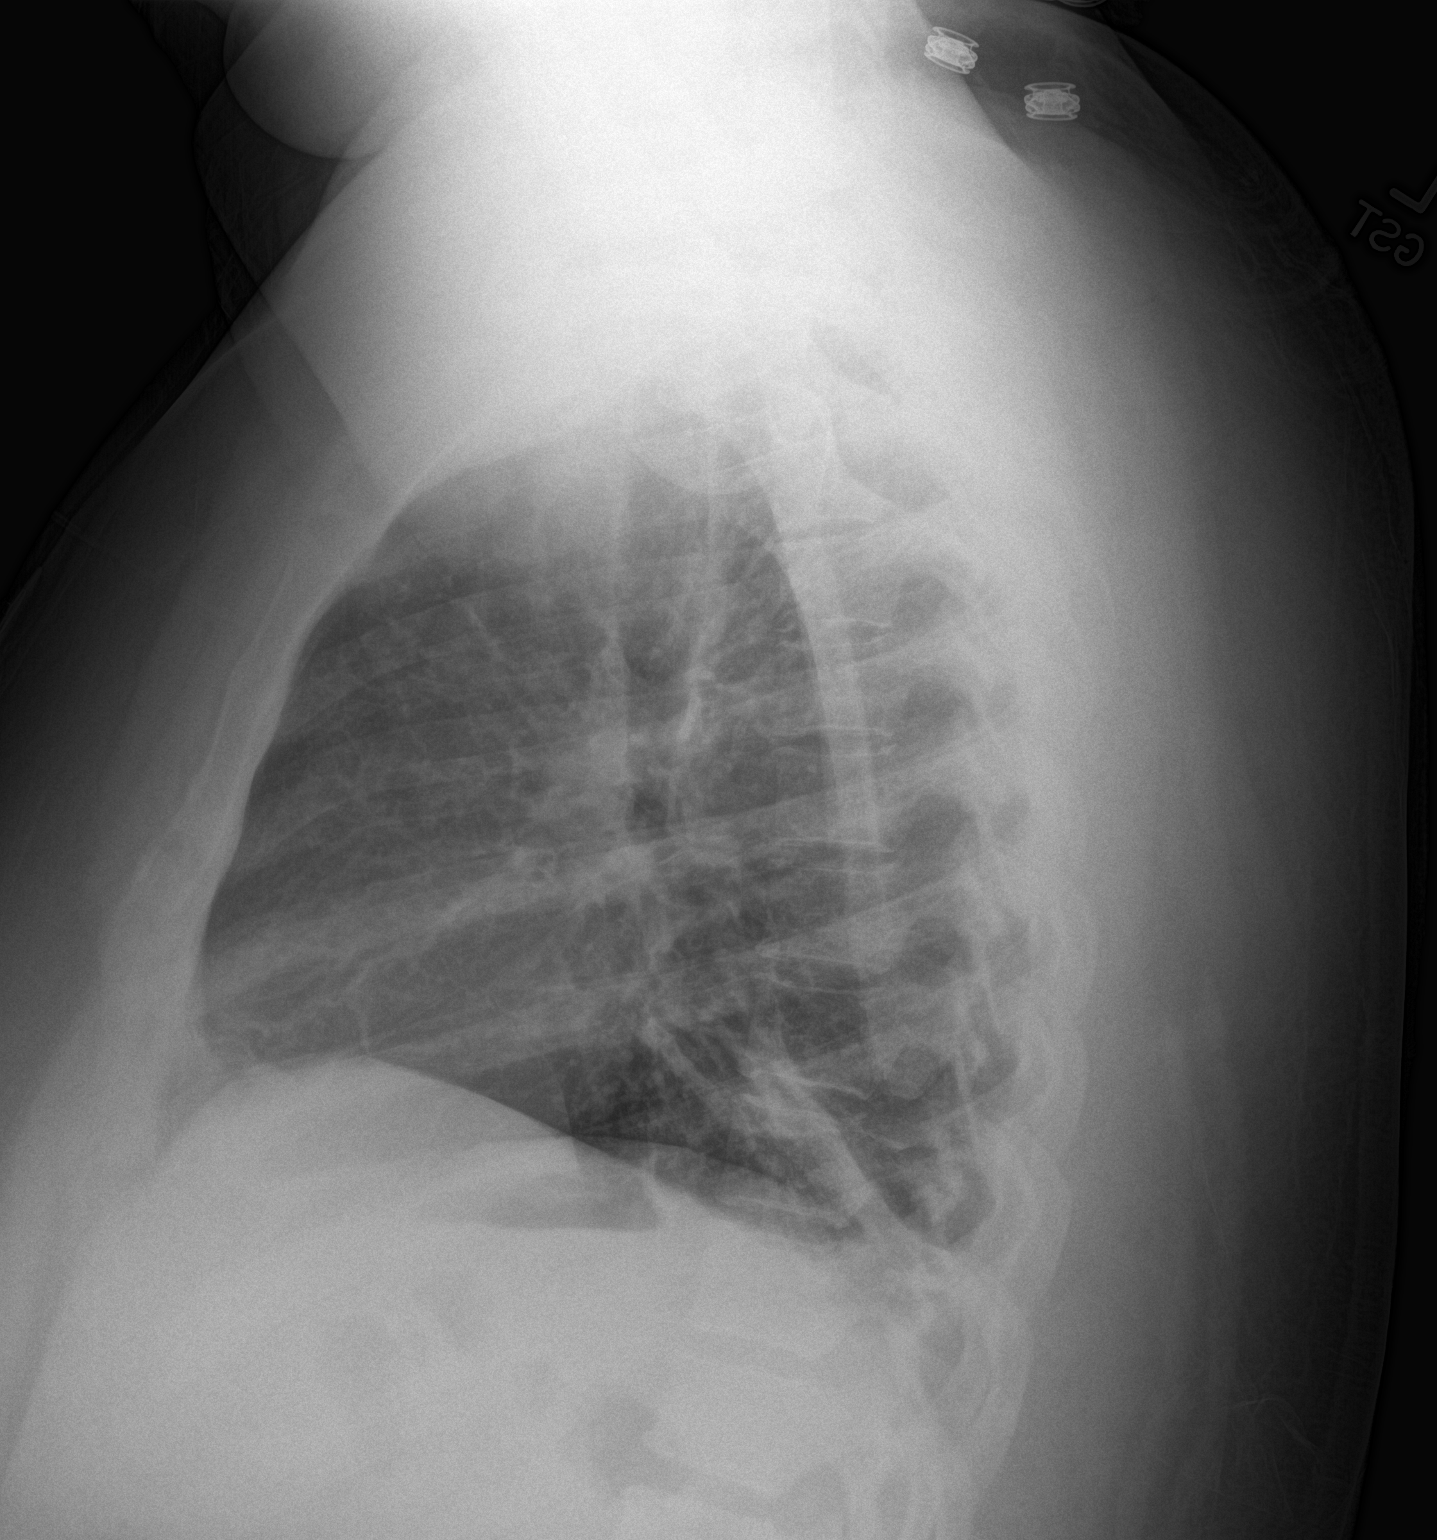

[2 of 2 positions shown; findings below may reference images not displayed]

FINDINGS: The cardiomediastinal contours are normal. Irregular linear
opacities in the posterior left lower lobe. Pulmonary vasculature is
normal. No pleural effusion or pneumothorax. No acute osseous
abnormalities are seen.
IMPRESSION: Irregular linear opacities in the posterior left lower lobe may
reflect atelectasis versus pneumonia.

## 2014-08-02 MED ORDER — POTASSIUM CHLORIDE CRYS ER 20 MEQ PO TBCR
40.0000 meq | EXTENDED_RELEASE_TABLET | Freq: Once | ORAL | Status: AC
  Administered 2014-08-02: 40 meq via ORAL
  Filled 2014-08-02: qty 2

## 2014-08-02 MED ORDER — SODIUM CHLORIDE 0.9 % IV BOLUS (SEPSIS)
1000.0000 mL | Freq: Once | INTRAVENOUS | Status: AC
Start: 2014-08-02 — End: 2014-08-03
  Administered 2014-08-02: 1000 mL via INTRAVENOUS

## 2014-08-02 NOTE — ED Notes (Signed)
Patient here with complaint of chest tightness beginning yesterday. States that when the pain comes it causes her to cough and increases pain. Additionally states some nausea and shortness of breath accompanied by radiation through chest to her back. Denies history of asthma.

## 2014-08-02 NOTE — ED Provider Notes (Signed)
CSN: [id number]     Arrival date & time 08/02/14  2147 History   First MD Initiated Contact with Patient 08/02/14 2256     Chief Complaint  Patient presents with  . Chest Pain  . Shortness of Breath     (Consider location/radiation/quality/duration/timing/severity/associated sxs/prior Treatment) HPI  ASPASIA RUDE is a 34 y.o. female with past medical history of hypertension presenting today with chest pain. Patient describes the pain as bilateral and sharp with radiation to her back. It is made worse with deep breathing and coughing. She denies having this pain in the past. She states she recently came back from [place] 2 weeks ago, this is a 5 hour car ride. Her symptoms have started over the last 3 days. She denies any leg swelling or pain. She has no history of MI, pulmonary embolism in the past. Patient's mother does have a history of PE. She denies any fevers, upper respiratory symptoms. Patient has no further complaints.  10 Systems reviewed and are negative for acute change except as noted in the HPI.     Past Medical History  Diagnosis Date  . Hypertension   . Foot pain   . Obesity   . Anxiety    Past Surgical History  Procedure Laterality Date  . Cholecystectomy    . Breast reduction surgery     No family history on file. History  Substance Use Topics  . Smoking status: Never Smoker   . Smokeless tobacco: Not on file  . Alcohol Use: Yes     Comment: 4 times a year   OB History    Gravida Para Term Preterm AB TAB SAB Ectopic Multiple Living   Review of Systems    Allergies  Fish allergy; Macrobid; Flagyl; Tramadol; and Vicodin  Home Medications   Prior to Admission medications   Medication Sig Start Date End Date Taking? Authorizing Provider  albuterol (PROVENTIL HFA;VENTOLIN HFA) 108 (90 BASE) MCG/ACT inhaler Inhale 1-2 puffs into the lungs every 6 (six) hours as needed for wheezing or shortness of breath. 04/15/14  Yes  Shari Upstill, PA-C  amoxicillin (AMOXIL) 400 MG/5ML suspension Take 6.3 mLs (500 mg total) by mouth 2 (two) times daily. 07/31/14 08/07/14 Yes Charm Rings, MD  DM-Phenylephrine-Acetaminophen (ALKA-SELTZER PLS SINUS & COUGH PO) Take 2 tablets by mouth at bedtime as needed (for cold symptom).   Yes Historical Provider, MD  lisinopril-hydrochlorothiazide (PRINZIDE,ZESTORETIC) 10-12.5 MG per tablet Take 1 tablet by mouth daily.   Yes Historical Provider, MD  phentermine (ADIPEX-P) 37.5 MG tablet Take 37.5 mg by mouth daily. 03/13/14  Yes Historical Provider, MD  Fexofenadine HCl (ALLEGRA PO) Take 1 tablet by mouth daily as needed (for allergy symptoms-OTC).    Historical Provider, MD  naproxen sodium (ALEVE) 220 MG tablet Take 2 tablets twice a day as needed for chest wall pain. Patient not taking: Reported on 08/02/2014 04/18/13   Paula Libra, MD   BP 121/74 mmHg  Pulse 105  Temp(Src) 99 F (37.2 C) (Oral)  Resp 24  SpO2 97%  LMP 07/31/2014 (Exact Date) Physical Exam  Constitutional: She is oriented to person, place, and time. She appears well-developed and well-nourished. No distress.  HENT:  Head: Normocephalic and atraumatic.  Nose: Nose normal.  Mouth/Throat: Oropharynx is clear and moist. No oropharyngeal exudate.  Eyes: Conjunctivae and EOM are normal. Pupils are equal, round, and reactive to light. No scleral icterus.  Neck: Normal range of motion. Neck supple. No JVD present. No tracheal deviation present. No thyromegaly present.  Cardiovascular: Regular rhythm and normal heart sounds.  Exam reveals no gallop and no friction rub.   No murmur heard. Tachycardia  Pulmonary/Chest: Effort normal and breath sounds normal. No respiratory distress. She has no wheezes. She exhibits no tenderness.  Abdominal: Soft. Bowel sounds are normal. She exhibits no distension and no mass. There is no tenderness. There is no rebound and no guarding.  Musculoskeletal: Normal range of motion. She exhibits  no edema or tenderness.  Lymphadenopathy:    She has no cervical adenopathy.  Neurological: She is alert and oriented to person, place, and time. No cranial nerve deficit. She exhibits normal muscle tone.  Skin: Skin is warm and dry. No rash noted. She is not diaphoretic. No erythema. No pallor.  Nursing note and vitals reviewed.   ED Course  Procedures (including critical care time) Labs Review Labs Reviewed  BASIC METABOLIC PANEL - Abnormal; Notable for the following:    Potassium 3.2 (*)    Glucose, Bld 126 (*)    All other components within normal limits  CBC  I-STAT TROPOININ, ED  POC URINE PREG, ED    Imaging Review Dg Chest 2 View  08/03/2014   CLINICAL DATA:  Mid chest pain radiating to the back for 4 days. Shortness of breath. Cough for 1 day.  EXAM: CHEST  2 VIEW  COMPARISON:  04/18/2013  FINDINGS: The cardiomediastinal contours are normal. Irregular linear opacities in the posterior left lower lobe. Pulmonary vasculature is normal. No pleural effusion or pneumothorax. No acute osseous abnormalities are seen.  IMPRESSION: Irregular linear opacities in the posterior left lower lobe may reflect atelectasis versus pneumonia.   Electronically Signed   By: Rubye Oaks M.D.   On: 08/03/2014 00:01   Ct Angio Chest Pe W/cm &/or Wo Cm  08/03/2014   CLINICAL DATA:  Chest pain and shortness of breath for 1 day.  EXAM: CT ANGIOGRAPHY CHEST WITH CONTRAST  TECHNIQUE: Multidetector CT imaging of the chest was performed using the standard protocol during bolus administration of intravenous contrast. Multiplanar CT image reconstructions and MIPs were obtained to evaluate the vascular anatomy.  CONTRAST:  80mL OMNIPAQUE IOHEXOL 350 MG/ML SOLN  COMPARISON:  Chest radiographs 1 day prior.  FINDINGS: There are no filling defects within the pulmonary arteries to suggest pulmonary embolus. The subsegmental branches of the lower lobes are suboptimally assessed due to motion artifact and soft tissue  attenuation from body habitus.  The thoracic aorta is normal in caliber. The heart size is normal. There is no pleural or pericardial effusion. No mediastinal or hilar adenopathy.  Mosaic attenuation of the lung parenchyma with geographic scattered ground-glass and lucent regions. Left lower lobe consolidation with air bronchograms. Minimal linear atelectasis in the right lower lobe.  Evaluation of the upper abdomen demonstrates no acute abnormality. Postcholecystectomy change noted.  There are no acute or suspicious osseous abnormalities.  Review of the MIP images confirms the above findings.  IMPRESSION: 1. No pulmonary embolus. 2. Left lower lobe consolidation with air bronchograms consistent with mild pneumonia. Linear atelectasis in the right lower lobe. 3. Mosaic attenuation of the lung parenchyma, can be seen in the setting of small airways disease such as asthma versus bronchiolitis obliterans. Nonemergent follow-up high-resolution chest CT recommended for further evaluation   Electronically Signed   By: Rubye Oaks M.D.   On: 08/03/2014 02:04     EKG Interpretation  Date/Time:  Wednesday August 02 2014 21:54:04 EDT Ventricular Rate:  109 PR Interval:  146 QRS Duration: 94 QT Interval:  324 QTC Calculation: 436 R Axis:   62 Text Interpretation:  Sinus tachycardia Nonspecific T wave abnormality  Abnormal ECG No significant change since last tracing Confirmed by Erroll Lunani,  Francessca Friis Ayokunle [phone]([id number]) on 08/02/2014 10:54:52 PM      MDM   Final diagnoses:  Chest pain   Patient since emergency department for chest pain. History is consistent with possible pulmonary embolism, she also has history of a long car ride. Patient is tachycardic in the room with a resting heart rate of 105. Will evaluate with CT scan of the chest. Patient was also given IV fluids in the emergency department. She is not requesting anything for pain currently. Potassium was replaced in the emergency department.   CT  images does not reveal pulmonary embolism, there is a left lower lobe pneumonia seen. Upon repeat evaluation the patient she does state that she may have had subjective fevers 2 days prior. She denies any productive cough. We'll treat for pneumonia, she was given azithromycin emergency department and will be discharged with a 5 day course. Primary care follow-up was advised within 3 days. Patient's tachycardia has resolved with IV fluids,  Heart rate in the 90s upon my evaluation,her vital signs remain within her normal limits and she is safe for discharge.    Tomasita CrumbleAdeleke Ayomikun Starling, MD 08/03/14 [phone]

## 2014-08-02 NOTE — ED Notes (Signed)
Patient transported to X-ray 

## 2014-08-03 ENCOUNTER — Emergency Department (HOSPITAL_COMMUNITY): Payer: Medicaid Other

## 2014-08-03 ENCOUNTER — Encounter (HOSPITAL_COMMUNITY): Payer: Self-pay

## 2014-08-03 LAB — CULTURE, GROUP A STREP: Strep A Culture: NEGATIVE

## 2014-08-03 IMAGING — CT CT ANGIO CHEST
1 of 8 series · 17 of 36 positions shown · IV contrast (Iohexol (Omnipaque 350))
Comparison: Chest radiographs 1 day prior.

CLINICAL DATA: Chest pain and shortness of breath for 1 day.

EXAM:
CT ANGIOGRAPHY CHEST WITH CONTRAST
TECHNIQUE: Multidetector CT imaging of the chest was performed using the
standard protocol during bolus administration of intravenous
contrast. Multiplanar CT image reconstructions and MIPs were
obtained to evaluate the vascular anatomy.
CONTRAST:  80mL OMNIPAQUE IOHEXOL 350 MG/ML SOLN

[Series 406: thins pacs · axial · 0.57mm/px · z∈[+76,+306]mm · 17 of 260 slices shown]
[im 15/260  lung]
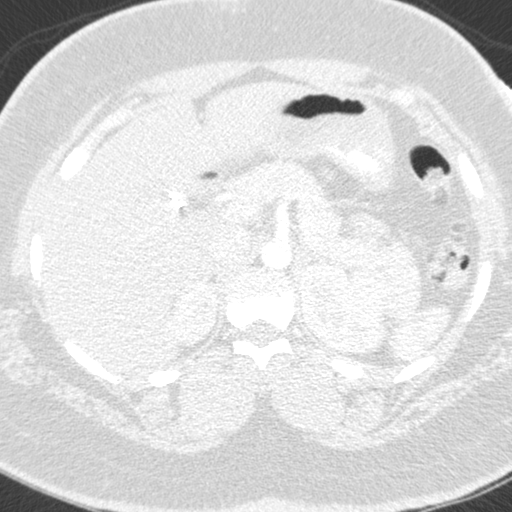
[im 29/260  mediastinal]
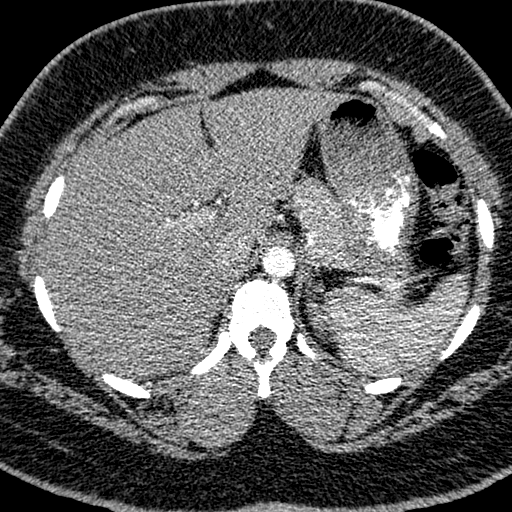
[im 44/260  lung]
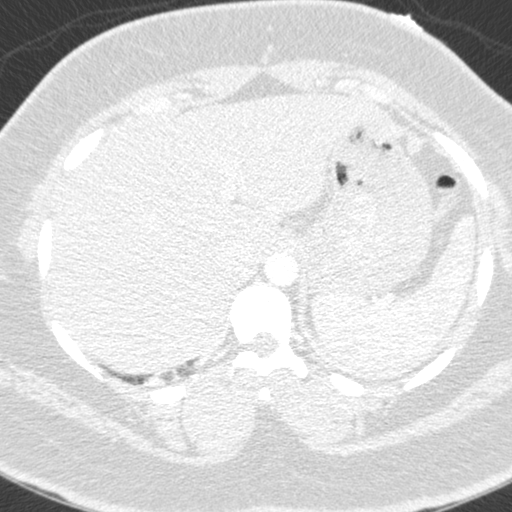
[im 58/260  mediastinal]
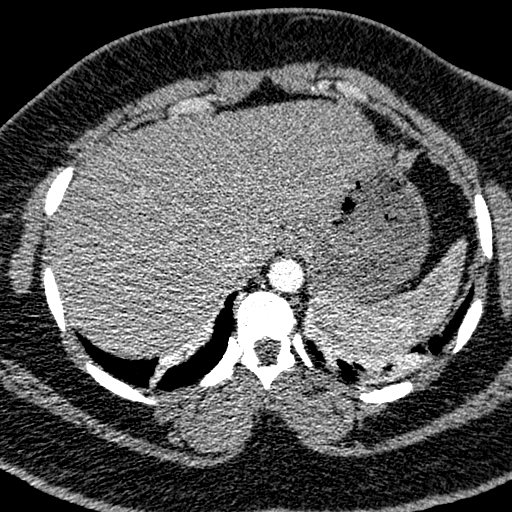
[im 72/260  lung]
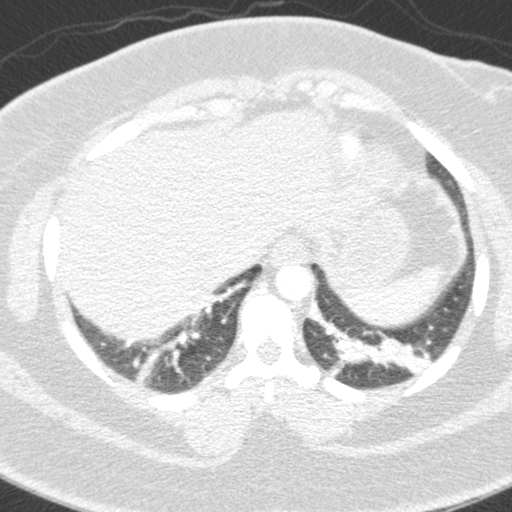
[im 87/260  mediastinal]
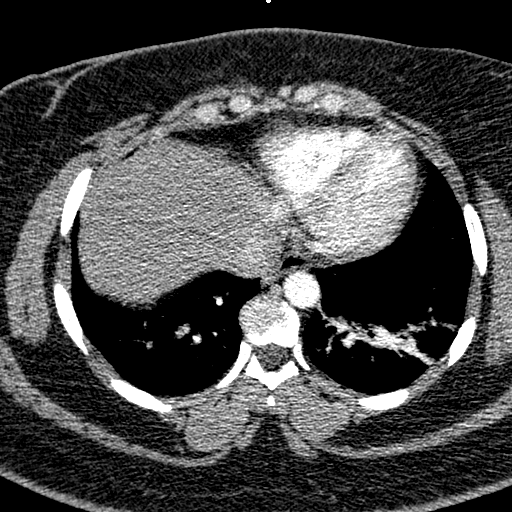
[im 101/260  lung]
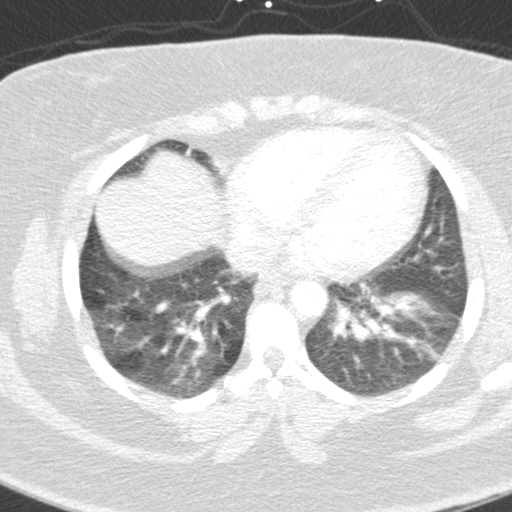
[im 116/260  mediastinal]
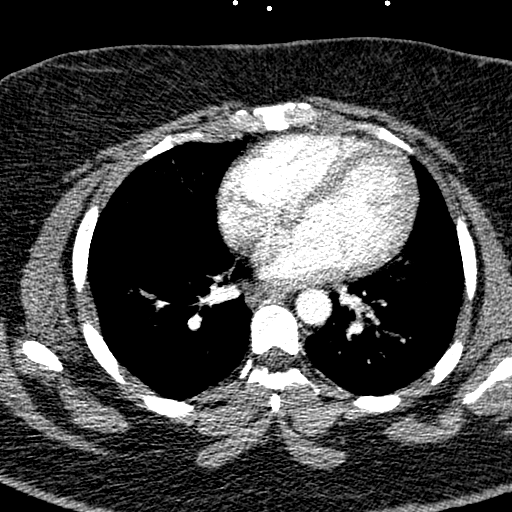
[im 130/260  lung]
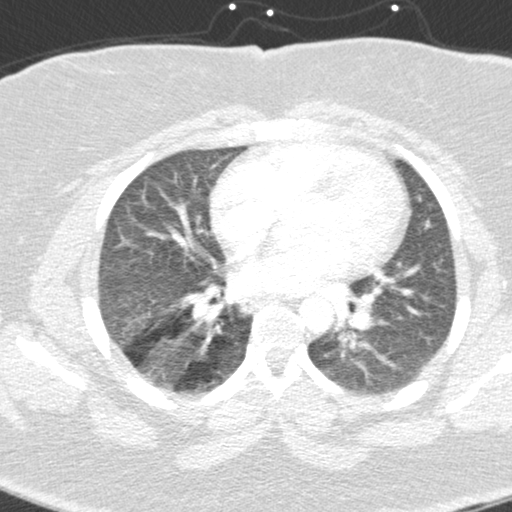
[im 144/260  mediastinal]
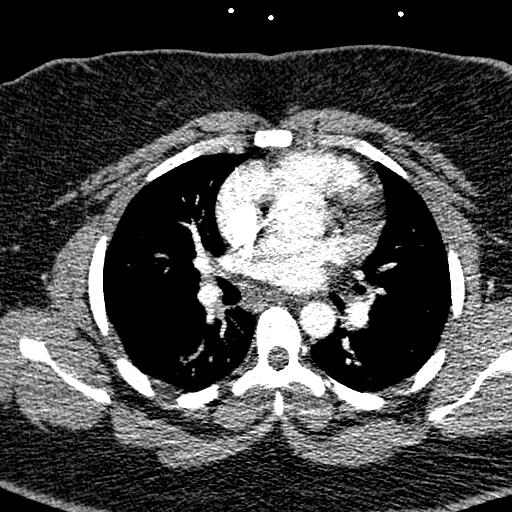
[im 159/260  lung]
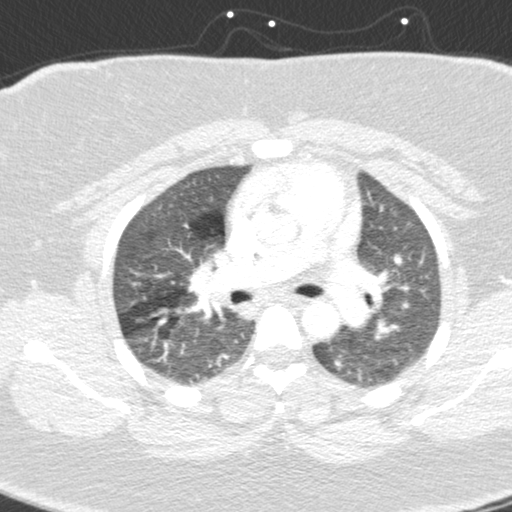
[im 173/260  mediastinal]
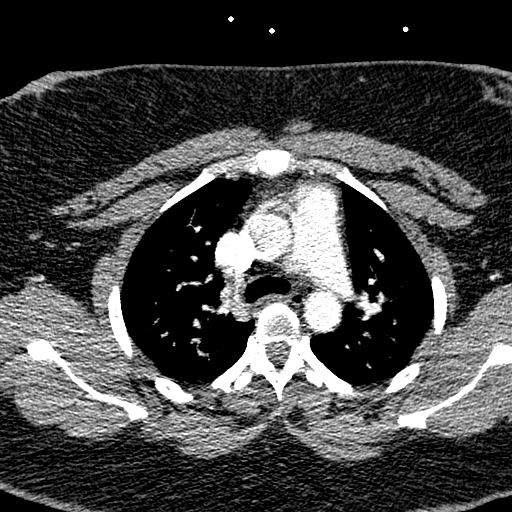
[im 188/260  lung]
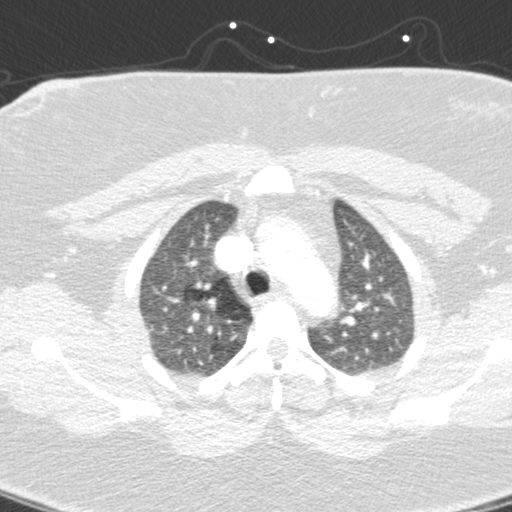
[im 202/260  mediastinal]
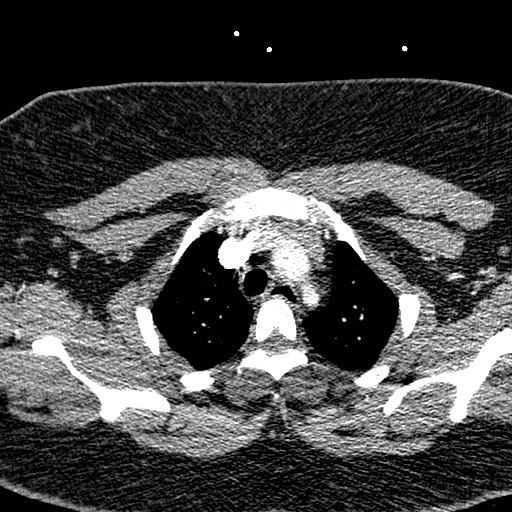
[im 216/260  lung]
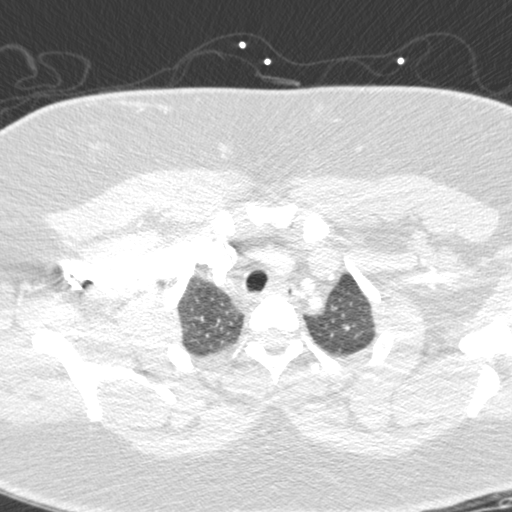
[im 231/260  mediastinal]
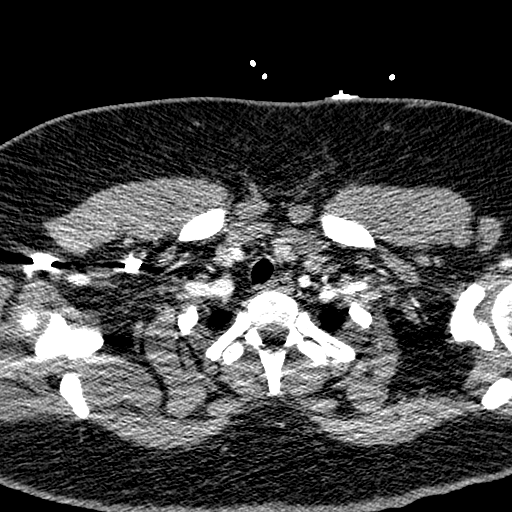
[im 245/260  lung]
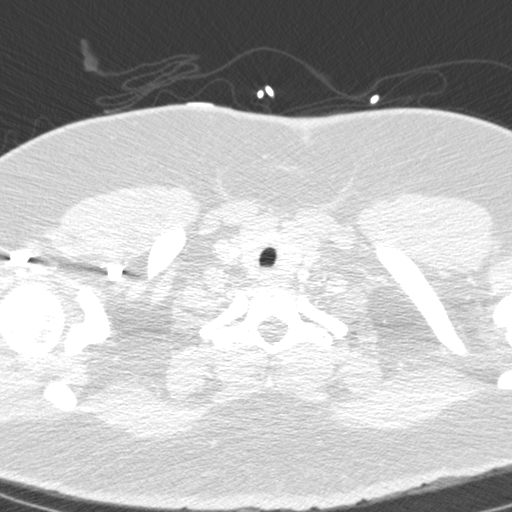

[17 of 36 positions shown; findings below may reference images not displayed]

FINDINGS: There are no filling defects within the pulmonary arteries to
suggest pulmonary embolus. The subsegmental branches of the lower
lobes are suboptimally assessed due to motion artifact and soft
tissue attenuation from body habitus.

The thoracic aorta is normal in caliber. The heart size is normal.
There is no pleural or pericardial effusion. No mediastinal or hilar
adenopathy.

Mosaic attenuation of the lung parenchyma with geographic scattered
ground-glass and lucent regions. Left lower lobe consolidation with
air bronchograms. Minimal linear atelectasis in the right lower
lobe.

Evaluation of the upper abdomen demonstrates no acute abnormality.
Postcholecystectomy change noted.

There are no acute or suspicious osseous abnormalities.

Review of the MIP images confirms the above findings.
IMPRESSION: 1. No pulmonary embolus.
2. Left lower lobe consolidation with air bronchograms consistent
with mild pneumonia. Linear atelectasis in the right lower lobe.
3. Mosaic attenuation of the lung parenchyma, can be seen in the
setting of small airways disease such as asthma versus bronchiolitis
obliterans. Nonemergent follow-up high-resolution chest CT
recommended for further evaluation

## 2014-08-03 MED ORDER — IOHEXOL 350 MG/ML SOLN
100.0000 mL | Freq: Once | INTRAVENOUS | Status: AC | PRN
  Administered 2014-08-03: 80 mL via INTRAVENOUS

## 2014-08-03 MED ORDER — AZITHROMYCIN 250 MG PO TABS
500.0000 mg | ORAL_TABLET | Freq: Once | ORAL | Status: AC
  Administered 2014-08-03: 500 mg via ORAL
  Filled 2014-08-03: qty 2

## 2014-08-03 MED ORDER — AZITHROMYCIN 250 MG PO TABS: 250.0000 mg | ORAL_TABLET | Freq: Every day | ORAL | Status: DC

## 2014-08-03 NOTE — ED Notes (Signed)
Patient transported to CT 

## 2014-08-03 NOTE — Discharge Instructions (Signed)
Pneumonia, Adult Ms. Robin Davis,  Your CT scan results are below and showed pneumonia. Follow-up with your primary care physician within 3 days for continued care. Take antibiotics as prescribed. If any symptoms worsen come back to emergency department immediately. Thank you.  IMPRESSION: 1. No pulmonary embolus. 2. Left lower lobe consolidation with air bronchograms consistent with mild pneumonia. Linear atelectasis in the right lower lobe. 3. Mosaic attenuation of the lung parenchyma, can be seen in the setting of small airways disease such as asthma versus bronchiolitis obliterans. Nonemergent follow-up high-resolution chest CT recommended for further evaluation Pneumonia is an infection of the lungs. It may be caused by a germ (virus or bacteria). Some types of pneumonia can spread easily from person to person. This can happen when you cough or sneeze. HOME CARE  Only take medicine as told by your doctor.  Take your medicine (antibiotics) as told. Finish it even if you start to feel better.  Do not smoke.  You may use a vaporizer or humidifier in your room. This can help loosen thick spit (mucus).  Sleep so you are almost sitting up (semi-upright). This helps reduce coughing.  Rest. A shot (vaccine) can help prevent pneumonia. Shots are often advised for:  People over 35 years old.  Patients on chemotherapy.  People with long-term (chronic) lung problems.  People with immune system problems. GET HELP RIGHT AWAY IF:   You are getting worse.  You cannot control your cough, and you are losing sleep.  You cough up blood.  Your pain gets worse, even with medicine.  You have a fever.  Any of your problems are getting worse, not better.  You have shortness of breath or chest pain. MAKE SURE YOU:   Understand these instructions.  Will watch your condition.  Will get help right away if you are not doing well or get worse. Document Released: 10/15/2007 Document  Revised: 07/21/2011 Document Reviewed: 07/19/2010 [facility] Patient Information 2015 [employer], [place]. This information is not intended to replace advice given to you by your health care provider. Make sure you discuss any questions you have with your health care provider.

## 2014-08-20 ENCOUNTER — Emergency Department (HOSPITAL_COMMUNITY)
Admission: EM | Admit: 2014-08-20 | Discharge: 2014-08-20 | Disposition: A | Discharge status: Home / Self Care | Payer: Medicaid Other | Attending: Emergency Medicine | Admitting: Emergency Medicine

## 2014-08-20 ENCOUNTER — Encounter (HOSPITAL_COMMUNITY): Payer: Self-pay | Admitting: *Deleted

## 2014-08-20 ENCOUNTER — Emergency Department (HOSPITAL_COMMUNITY): Payer: Medicaid Other

## 2014-08-20 DIAGNOSIS — Y939 Activity, unspecified: Secondary | ICD-10-CM | POA: Insufficient documentation

## 2014-08-20 DIAGNOSIS — X58XXXA Exposure to other specified factors, initial encounter: Secondary | ICD-10-CM | POA: Diagnosis not present

## 2014-08-20 DIAGNOSIS — I10 Essential (primary) hypertension: Secondary | ICD-10-CM | POA: Diagnosis not present

## 2014-08-20 DIAGNOSIS — Z8659 Personal history of other mental and behavioral disorders: Secondary | ICD-10-CM | POA: Diagnosis not present

## 2014-08-20 DIAGNOSIS — E669 Obesity, unspecified: Secondary | ICD-10-CM | POA: Insufficient documentation

## 2014-08-20 DIAGNOSIS — Z79899 Other long term (current) drug therapy: Secondary | ICD-10-CM | POA: Insufficient documentation

## 2014-08-20 DIAGNOSIS — Y929 Unspecified place or not applicable: Secondary | ICD-10-CM | POA: Diagnosis not present

## 2014-08-20 DIAGNOSIS — R109 Unspecified abdominal pain: Secondary | ICD-10-CM | POA: Diagnosis present

## 2014-08-20 DIAGNOSIS — S39011A Strain of muscle, fascia and tendon of abdomen, initial encounter: Secondary | ICD-10-CM | POA: Diagnosis not present

## 2014-08-20 DIAGNOSIS — Z792 Long term (current) use of antibiotics: Secondary | ICD-10-CM | POA: Insufficient documentation

## 2014-08-20 DIAGNOSIS — Y999 Unspecified external cause status: Secondary | ICD-10-CM | POA: Diagnosis not present

## 2014-08-20 DIAGNOSIS — T148XXA Other injury of unspecified body region, initial encounter: Secondary | ICD-10-CM

## 2014-08-20 DIAGNOSIS — Z3202 Encounter for pregnancy test, result negative: Secondary | ICD-10-CM | POA: Diagnosis not present

## 2014-08-20 LAB — CBC WITH DIFFERENTIAL/PLATELET
BASOS ABS: 0 10*3/uL (ref 0.0–0.1)
BASOS PCT: 0 % (ref 0–1)
EOS ABS: 0.3 10*3/uL (ref 0.0–0.7)
Eosinophils Relative: 2 % (ref 0–5)
HEMATOCRIT: 37.7 % (ref 36.0–46.0)
HEMOGLOBIN: 12.6 g/dL (ref 12.0–15.0)
LYMPHS ABS: 3.7 10*3/uL (ref 0.7–4.0)
Lymphocytes Relative: 32 % (ref 12–46)
MCH: 27.6 pg (ref 26.0–34.0)
MCHC: 33.4 g/dL (ref 30.0–36.0)
MCV: 82.7 fL (ref 78.0–100.0)
MONO ABS: 1.2 10*3/uL — AB (ref 0.1–1.0)
Monocytes Relative: 10 % (ref 3–12)
NEUTROS ABS: 6.4 10*3/uL (ref 1.7–7.7)
NEUTROS PCT: 56 % (ref 43–77)
Platelets: 298 10*3/uL (ref 150–400)
RBC: 4.56 MIL/uL (ref 3.87–5.11)
RDW: 13.5 % (ref 11.5–15.5)
WBC: 11.6 10*3/uL — AB (ref 4.0–10.5)

## 2014-08-20 LAB — URINALYSIS, ROUTINE W REFLEX MICROSCOPIC
BILIRUBIN URINE: NEGATIVE
Glucose, UA: NEGATIVE mg/dL
Hgb urine dipstick: NEGATIVE
Ketones, ur: 15 mg/dL — AB
Leukocytes, UA: NEGATIVE
Nitrite: NEGATIVE
Protein, ur: NEGATIVE mg/dL
Specific Gravity, Urine: 1.028 (ref 1.005–1.030)
Urobilinogen, UA: 1 mg/dL (ref 0.0–1.0)
pH: 6 (ref 5.0–8.0)

## 2014-08-20 LAB — COMPREHENSIVE METABOLIC PANEL
ALT: 23 U/L (ref 0–35)
ANION GAP: 4 — AB (ref 5–15)
AST: 21 U/L (ref 0–37)
Albumin: 3.6 g/dL (ref 3.5–5.2)
Alkaline Phosphatase: 63 U/L (ref 39–117)
BUN: 11 mg/dL (ref 6–23)
CO2: 31 mmol/L (ref 19–32)
Calcium: 9.3 mg/dL (ref 8.4–10.5)
Chloride: 100 mmol/L (ref 96–112)
Creatinine, Ser: 0.75 mg/dL (ref 0.50–1.10)
GFR calc non Af Amer: >90 mL/min (ref >90)
Glucose, Bld: 118 mg/dL — ABNORMAL HIGH (ref 70–99)
POTASSIUM: 3.5 mmol/L (ref 3.5–5.1)
Sodium: 135 mmol/L (ref 135–145)
Total Bilirubin: 0.7 mg/dL (ref 0.3–1.2)
Total Protein: 6.9 g/dL (ref 6.0–8.3)

## 2014-08-20 LAB — LIPASE, BLOOD: LIPASE: 38 U/L (ref 11–59)

## 2014-08-20 LAB — POC URINE PREG, ED: Preg Test, Ur: NEGATIVE

## 2014-08-20 IMAGING — CR DG CHEST 2V
2 series · 2 of 2 positions shown · non-contrast
Comparison: 08/02/2014

CLINICAL DATA: Left-sided flank pain beginning this morning.
History of hypertension and bronchitis.

EXAM:
CHEST  2 VIEW

[w chest pa]
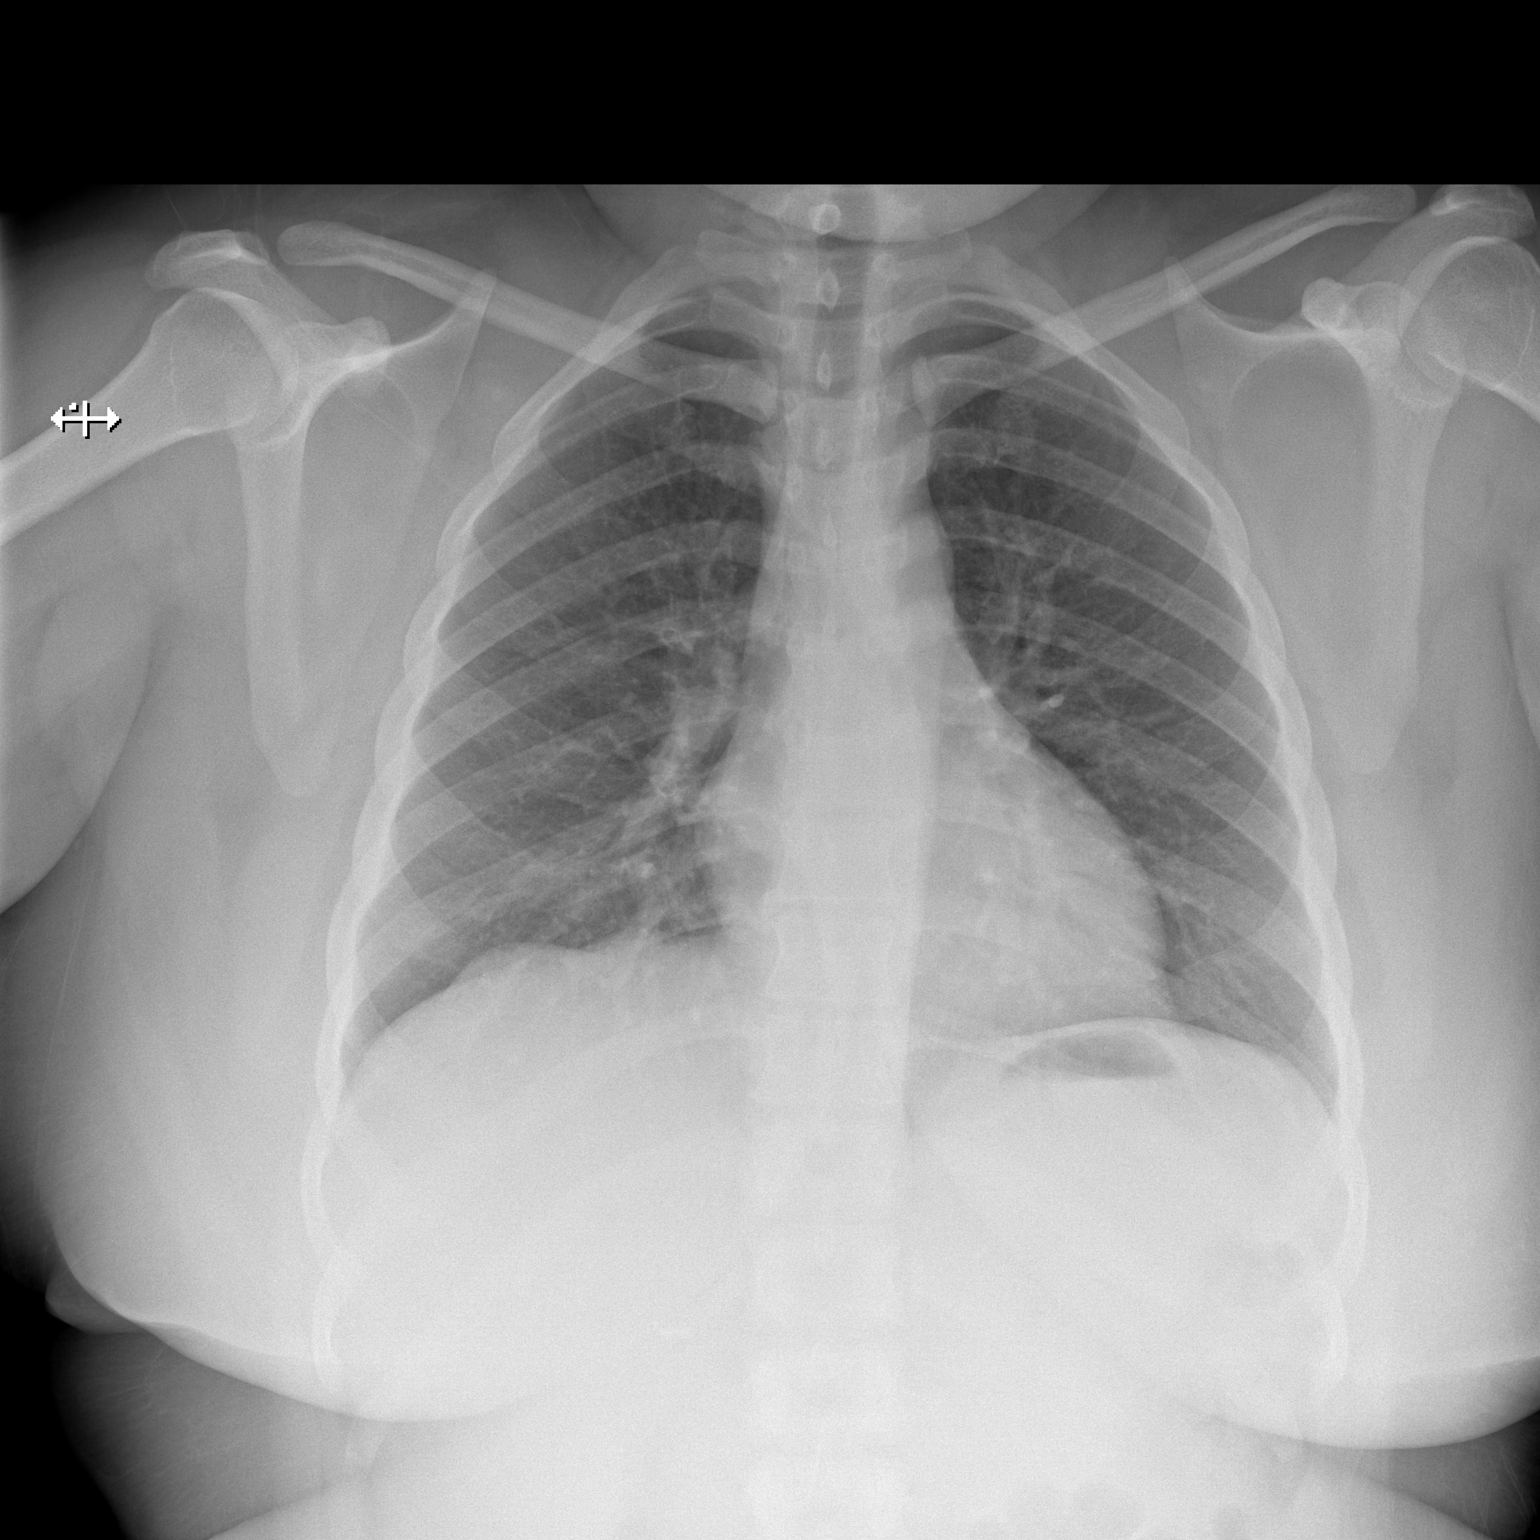

[w chest lat]
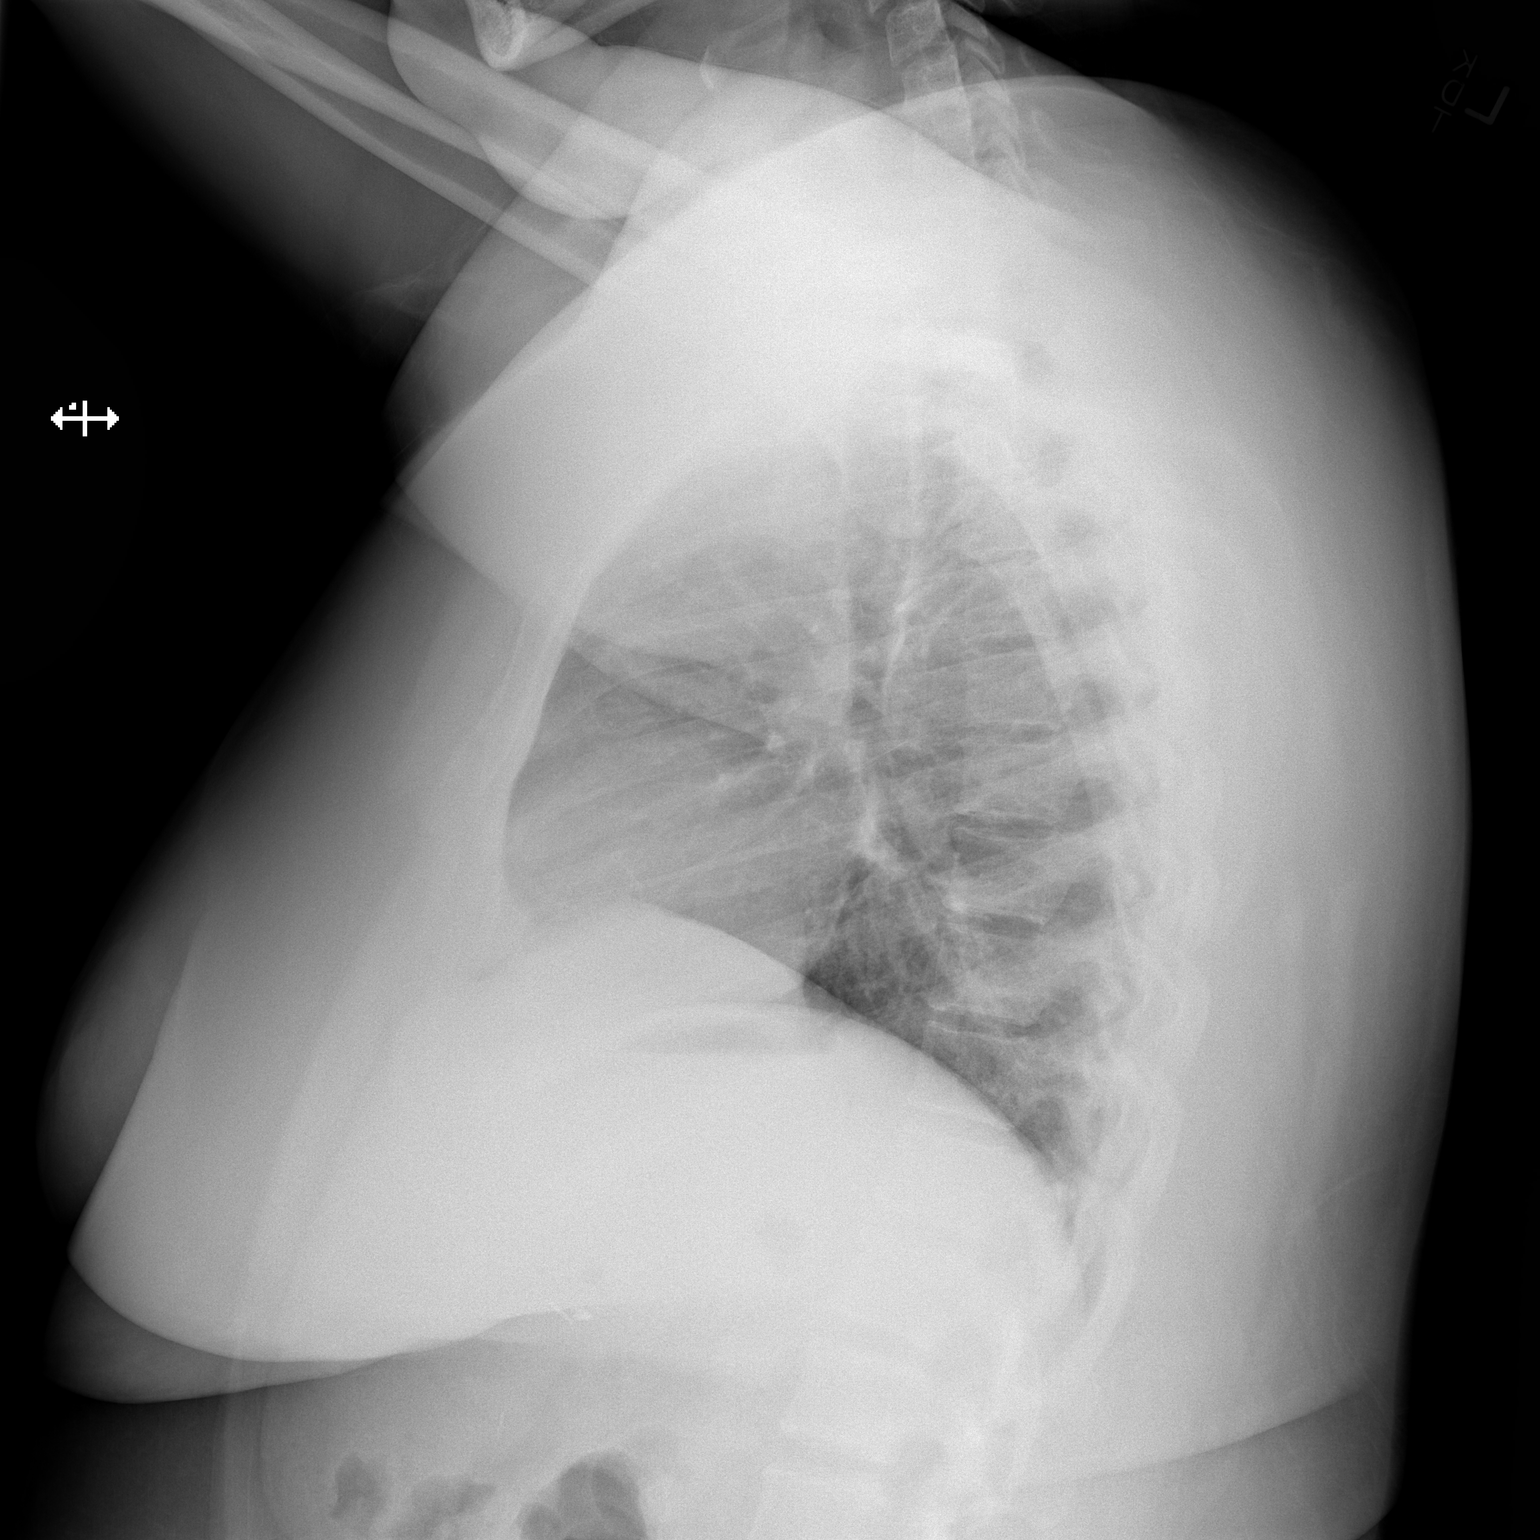

[2 of 2 positions shown; findings below may reference images not displayed]

FINDINGS: Normal heart size and mediastinal contours. No acute infiltrate or
edema. Lower lobe atelectasis/ consolidation has resolved. No
effusion or pneumothorax. No acute osseous findings.
Cholecystectomy.
IMPRESSION: No active cardiopulmonary disease.

## 2014-08-20 MED ORDER — IBUPROFEN 600 MG PO TABS: 600.0000 mg | ORAL_TABLET | Freq: Four times a day (QID) | ORAL | Status: DC | PRN

## 2014-08-20 MED ORDER — OXYCODONE-ACETAMINOPHEN 5-325 MG PO TABS
1.0000 | ORAL_TABLET | Freq: Once | ORAL | Status: AC
  Administered 2014-08-20: 1 via ORAL
  Filled 2014-08-20: qty 1

## 2014-08-20 MED ORDER — LORAZEPAM 1 MG PO TABS
1.0000 mg | ORAL_TABLET | Freq: Once | ORAL | Status: AC
  Administered 2014-08-20: 1 mg via ORAL
  Filled 2014-08-20: qty 1

## 2014-08-20 MED ORDER — ONDANSETRON 4 MG PO TBDP
8.0000 mg | ORAL_TABLET | Freq: Once | ORAL | Status: AC
  Administered 2014-08-20: 8 mg via ORAL
  Filled 2014-08-20: qty 2

## 2014-08-20 MED ORDER — KETOROLAC TROMETHAMINE 15 MG/ML IJ SOLN: 15.0000 mg | Freq: Once | INTRAMUSCULAR | Status: DC

## 2014-08-20 NOTE — Discharge Instructions (Signed)

## 2014-08-20 NOTE — ED Notes (Signed)
The pt is c/o  Lt flank pain  Since this am.  No urinary symptoms diarrhea.  lmp 2 weeks ago

## 2014-08-20 NOTE — ED Provider Notes (Signed)
CSN: [id number]     Arrival date & time 08/20/14  1942 History   First MD Initiated Contact with Patient 08/20/14 2001     Chief Complaint  Patient presents with  . Flank Pain     (Consider location/radiation/quality/duration/timing/severity/associated sxs/prior Treatment) HPI  This is a 34 yo female with PMH HTN, anxiety, presenting today with flank pain.  Onset earlier today, located left flank.  Persistent, throbbing.  No meds have been taken.  Non-radiating.  Negative for CP, cough, dysuria, hematuria, vaginal discharge, additional abdominal pain, weakness, numbness, or tingling.  She does state that her urine is cloudy, with foul smell.  She also slept sitting up last night, as she was intoxicated.  Past Medical History  Diagnosis Date  . Hypertension   . Foot pain   . Obesity   . Anxiety    Past Surgical History  Procedure Laterality Date  . Cholecystectomy    . Breast reduction surgery     No family history on file. History  Substance Use Topics  . Smoking status: Never Smoker   . Smokeless tobacco: Not on file  . Alcohol Use: Yes     Comment: 4 times a year   OB History    Gravida Para Term Preterm AB TAB SAB Ectopic Multiple Living   Review of Systems  Constitutional: Negative for fever and chills.  HENT: Negative for facial swelling.   Eyes: Negative for photophobia and pain.  Respiratory: Negative for cough and shortness of breath.   Cardiovascular: Negative for chest pain and leg swelling.  Gastrointestinal: Negative for nausea and vomiting.  Genitourinary: Negative for dysuria.  Musculoskeletal: Negative for arthralgias.  Skin: Negative for rash and wound.  Neurological: Negative for seizures.  Hematological: Negative for adenopathy.      Allergies  Fish allergy; Macrobid; Flagyl; Tramadol; and Vicodin  Home Medications   Prior to Admission medications   Medication Sig Start Date End Date Taking? Authorizing Provider   albuterol (PROVENTIL HFA;VENTOLIN HFA) 108 (90 BASE) MCG/ACT inhaler Inhale 1-2 puffs into the lungs every 6 (six) hours as needed for wheezing or shortness of breath. 04/15/14   Elpidio Anis, PA-C  azithromycin (ZITHROMAX) 250 MG tablet Take 1 tablet (250 mg total) by mouth daily. 08/03/14   Tomasita Crumble, MD  DM-Phenylephrine-Acetaminophen (ALKA-SELTZER PLS SINUS & COUGH PO) Take 2 tablets by mouth at bedtime as needed (for cold symptom).    Historical Provider, MD  Fexofenadine HCl (ALLEGRA PO) Take 1 tablet by mouth daily as needed (for allergy symptoms-OTC).    Historical Provider, MD  lisinopril-hydrochlorothiazide (PRINZIDE,ZESTORETIC) 10-12.5 MG per tablet Take 1 tablet by mouth daily.    Historical Provider, MD  naproxen sodium (ALEVE) 220 MG tablet Take 2 tablets twice a day as needed for chest wall pain. Patient not taking: Reported on 08/02/2014 04/18/13   Paula Libra, MD  phentermine (ADIPEX-P) 37.5 MG tablet Take 37.5 mg by mouth daily. 03/13/14   Historical Provider, MD   BP 142/76 mmHg  Pulse 100  Temp(Src) 98.6 F (37 C)  Resp 16  Ht  (1.575 m)  Wt 213 lb (96.616 kg)  BMI 38.95 kg/m2  SpO2 98%  LMP 08/06/2014 Physical Exam  Constitutional: She is oriented to person, place, and time. She appears well-developed and well-nourished. No distress.  HENT:  Head: Normocephalic and atraumatic.  Mouth/Throat: No oropharyngeal exudate.  Eyes: Conjunctivae are normal. Pupils are equal,  round, and reactive to light. No scleral icterus.  Neck: Normal range of motion. No tracheal deviation present. No thyromegaly present.  Cardiovascular: Normal rate, regular rhythm and normal heart sounds.  Exam reveals no gallop and no friction rub.   No murmur heard. Pulmonary/Chest: Effort normal and breath sounds normal. No stridor. No respiratory distress. She has no wheezes. She has no rales. She exhibits no tenderness.  Abdominal: Soft. She exhibits no distension and no mass. There is  tenderness (left flank). There is no rebound and no guarding.  Musculoskeletal: Normal range of motion. She exhibits no edema.  Neurological: She is alert and oriented to person, place, and time.  Skin: Skin is warm and dry. She is not diaphoretic.    ED Course  Procedures (including critical care time) Labs Review Labs Reviewed  CBC WITH DIFFERENTIAL/PLATELET - Abnormal; Notable for the following:    WBC 11.6 (*)    Monocytes Absolute 1.2 (*)    All other components within normal limits  URINALYSIS, ROUTINE W REFLEX MICROSCOPIC - Abnormal; Notable for the following:    Ketones, ur 15 (*)    All other components within normal limits  URINE CULTURE  COMPREHENSIVE METABOLIC PANEL  LIPASE, BLOOD  POC URINE PREG, ED    Imaging Review Dg Chest 2 View  08/20/2014   CLINICAL DATA:  Left-sided flank pain beginning this morning. History of hypertension and bronchitis.  EXAM: CHEST  2 VIEW  COMPARISON:  08/02/2014  FINDINGS: Normal heart size and mediastinal contours. No acute infiltrate or edema. Lower lobe atelectasis/ consolidation has resolved. No effusion or pneumothorax. No acute osseous findings. Cholecystectomy.  IMPRESSION: No active cardiopulmonary disease.   Electronically Signed   By: Marnee SpringJonathon  Watts M.D.   On: 08/20/2014 22:24    MDM   Final diagnoses:  Muscle strain    This is a 34 yo female with PMH HTN, anxiety, presenting today with flank pain.  Onset earlier today, located left flank.  Persistent, throbbing.  No meds have been taken.  Non-radiating.  Negative for CP, cough, dysuria, hematuria, vaginal discharge, additional abdominal pain, weakness, numbness, or tingling.  She does state that her urine is cloudy, with foul smell.  She also slept sitting up last night, as she was intoxicated.  Vitals WNL.  Positive for TTP of the left flank.  DDx includes pyelo vs MSK pain.  I do not suspect kidney stone, obstruction, perforation, emergent inflammation, ischemia, spinal fx,  dissection, aneurysm.  I doubt this represents PNA, but will check CXR.  Will fu UA, basic labs, treat with toradol.  If there is no evidence of UTI and patient's discomfort is well managed, will discharge with diagnosis of muscle strain on NSAIDs.  Pain alleviated, no lab evidence of UTI and pt has no dysuria or hematuria.  I believe pt is suffering from benign MSK pain. Pt stable for discharge, FU.  All questions answered.  Return precautions given.  I have discussed case and care has been guided by my attending physician, Dr. Denton LankSteinl.    Loma BostonStirling River Mckercher, MD 08/21/14 [id number]  Cathren LaineKevin Steinl, MD 08/21/14 [phone]

## 2014-08-20 NOTE — ED Notes (Signed)
Pt c/o left flank pain onset earlier today.

## 2014-08-22 LAB — URINE CULTURE: Colony Count: 50000

## 2015-04-10 ENCOUNTER — Emergency Department (HOSPITAL_COMMUNITY)
Admission: EM | Admit: 2015-04-10 | Discharge: 2015-04-10 | Disposition: A | Discharge status: Home / Self Care | Payer: Medicaid Other | Attending: Emergency Medicine | Admitting: Emergency Medicine

## 2015-04-10 ENCOUNTER — Encounter (HOSPITAL_COMMUNITY): Payer: Self-pay | Admitting: Emergency Medicine

## 2015-04-10 ENCOUNTER — Emergency Department (HOSPITAL_COMMUNITY): Payer: Medicaid Other

## 2015-04-10 DIAGNOSIS — Z79899 Other long term (current) drug therapy: Secondary | ICD-10-CM | POA: Diagnosis not present

## 2015-04-10 DIAGNOSIS — Z8659 Personal history of other mental and behavioral disorders: Secondary | ICD-10-CM | POA: Insufficient documentation

## 2015-04-10 DIAGNOSIS — M545 Low back pain: Secondary | ICD-10-CM | POA: Insufficient documentation

## 2015-04-10 DIAGNOSIS — J029 Acute pharyngitis, unspecified: Secondary | ICD-10-CM | POA: Diagnosis present

## 2015-04-10 DIAGNOSIS — R0789 Other chest pain: Secondary | ICD-10-CM | POA: Diagnosis not present

## 2015-04-10 DIAGNOSIS — E669 Obesity, unspecified: Secondary | ICD-10-CM | POA: Insufficient documentation

## 2015-04-10 DIAGNOSIS — I10 Essential (primary) hypertension: Secondary | ICD-10-CM | POA: Insufficient documentation

## 2015-04-10 DIAGNOSIS — J069 Acute upper respiratory infection, unspecified: Secondary | ICD-10-CM | POA: Diagnosis not present

## 2015-04-10 LAB — CBC WITH DIFFERENTIAL/PLATELET
Basophils Absolute: 0 10*3/uL (ref 0.0–0.1)
Basophils Relative: 0 %
EOS PCT: 3 %
Eosinophils Absolute: 0.2 10*3/uL (ref 0.0–0.7)
HEMATOCRIT: 43.9 % (ref 36.0–46.0)
Hemoglobin: 15.1 g/dL — ABNORMAL HIGH (ref 12.0–15.0)
LYMPHS ABS: 2.1 10*3/uL (ref 0.7–4.0)
LYMPHS PCT: 29 %
MCH: 28.5 pg (ref 26.0–34.0)
MCHC: 34.4 g/dL (ref 30.0–36.0)
MCV: 82.8 fL (ref 78.0–100.0)
Monocytes Absolute: 1.3 10*3/uL — ABNORMAL HIGH (ref 0.1–1.0)
Monocytes Relative: 19 %
NEUTROS ABS: 3.5 10*3/uL (ref 1.7–7.7)
Neutrophils Relative %: 49 %
Platelets: 320 10*3/uL (ref 150–400)
RBC: 5.3 MIL/uL — ABNORMAL HIGH (ref 3.87–5.11)
RDW: 13.7 % (ref 11.5–15.5)
WBC: 7.1 10*3/uL (ref 4.0–10.5)

## 2015-04-10 LAB — BASIC METABOLIC PANEL
ANION GAP: 9 (ref 5–15)
BUN: 8 mg/dL (ref 6–20)
CHLORIDE: 102 mmol/L (ref 101–111)
CO2: 28 mmol/L (ref 22–32)
Calcium: 9.7 mg/dL (ref 8.9–10.3)
Creatinine, Ser: 0.86 mg/dL (ref 0.44–1.00)
GFR calc Af Amer: >60 mL/min (ref >60)
Glucose, Bld: 96 mg/dL (ref 65–99)
Potassium: 3.6 mmol/L (ref 3.5–5.1)
Sodium: 139 mmol/L (ref 135–145)

## 2015-04-10 LAB — I-STAT TROPONIN, ED: Troponin i, poc: 0 ng/mL (ref 0.00–0.08)

## 2015-04-10 LAB — RAPID STREP SCREEN (MED CTR MEBANE ONLY): Streptococcus, Group A Screen (Direct): NEGATIVE

## 2015-04-10 IMAGING — CR DG CHEST 2V
2 series · 2 of 2 positions shown · non-contrast
Comparison: August 20, 2014

CLINICAL DATA: Cough and sore throat ; chest pain

EXAM:
CHEST  2 VIEW

[chest pa]
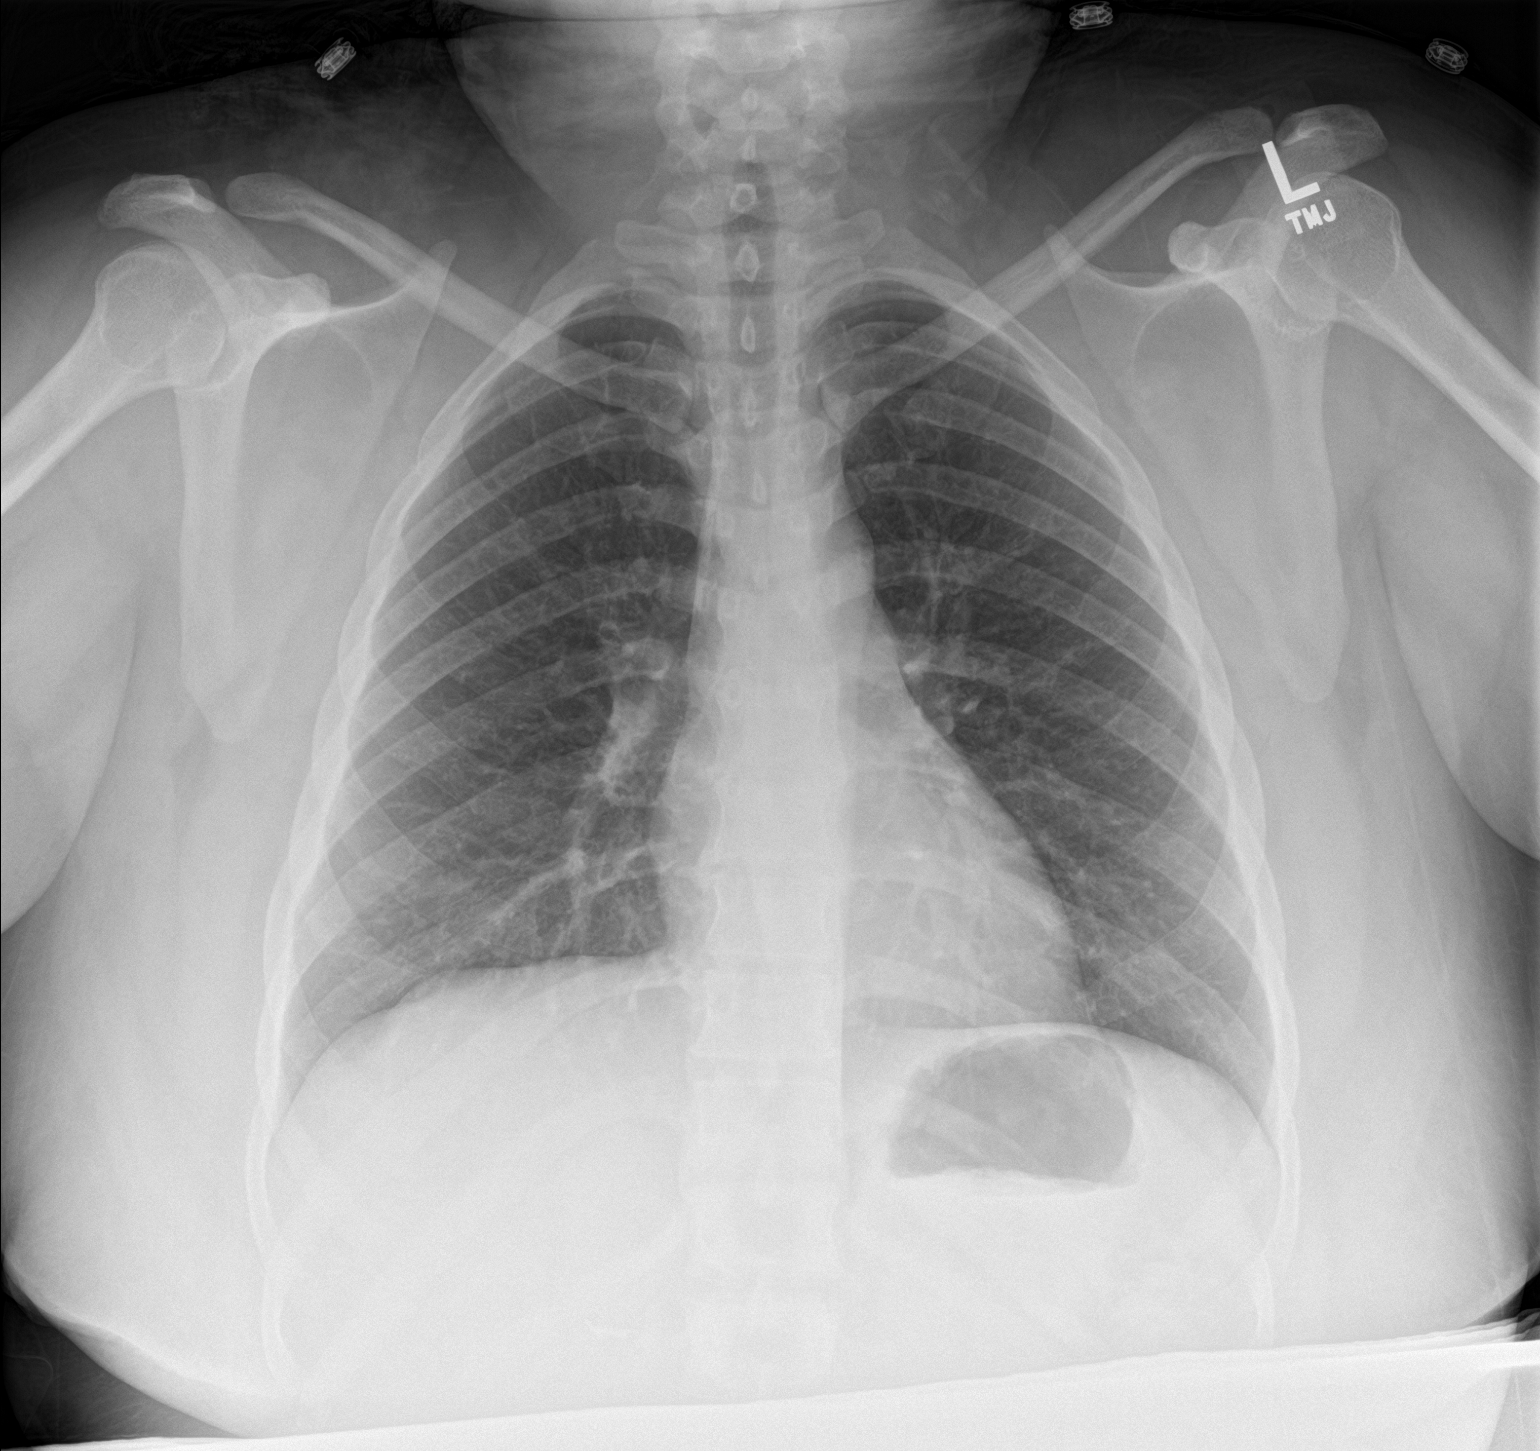

[chest lat]
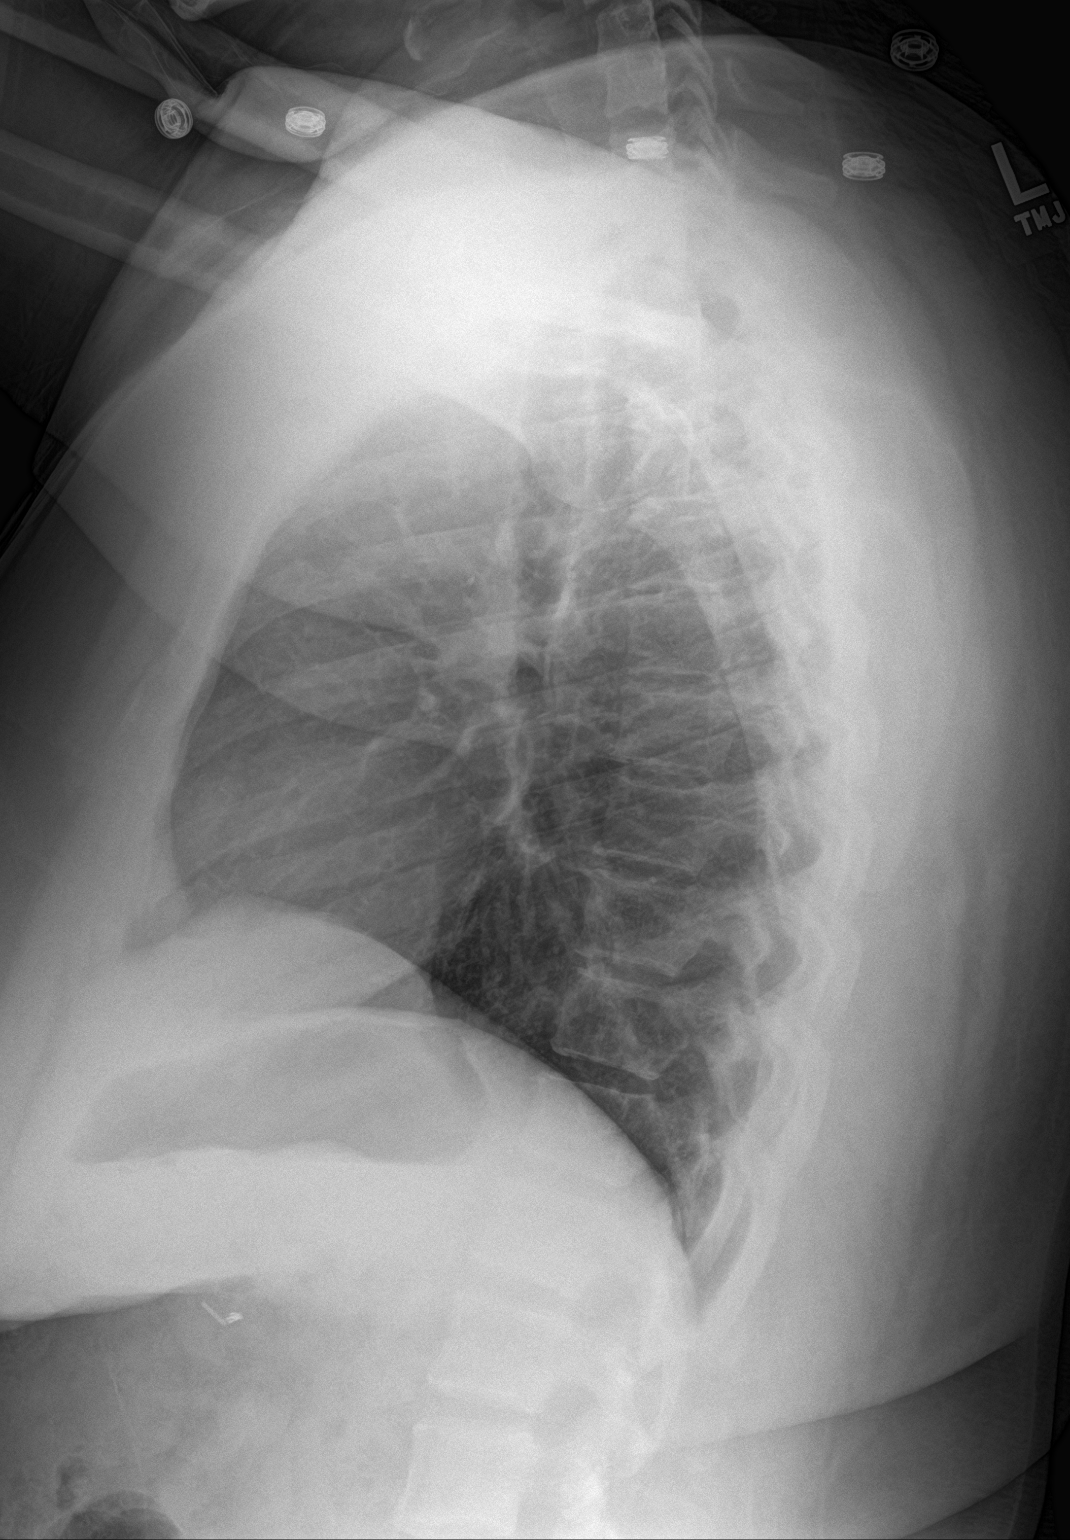

[2 of 2 positions shown; findings below may reference images not displayed]

FINDINGS: Lungs are clear. Heart size and pulmonary vascularity are normal. No
adenopathy. No pneumothorax. No bone lesions.
IMPRESSION: No edema or consolidation.

## 2015-04-10 MED ORDER — BENZONATATE 100 MG PO CAPS: 100.0000 mg | ORAL_CAPSULE | Freq: Three times a day (TID) | ORAL | Status: DC

## 2015-04-10 MED ORDER — BENZONATATE 100 MG PO CAPS
100.0000 mg | ORAL_CAPSULE | Freq: Once | ORAL | Status: AC
  Administered 2015-04-10: 100 mg via ORAL
  Filled 2015-04-10: qty 1

## 2015-04-10 MED ORDER — ALBUTEROL SULFATE HFA 108 (90 BASE) MCG/ACT IN AERS
1.0000 | INHALATION_SPRAY | RESPIRATORY_TRACT | Status: DC | PRN
  Administered 2015-04-10: 1 via RESPIRATORY_TRACT
  Filled 2015-04-10: qty 6.7

## 2015-04-10 MED ORDER — IBUPROFEN 800 MG PO TABS
800.0000 mg | ORAL_TABLET | Freq: Once | ORAL | Status: AC
  Administered 2015-04-10: 800 mg via ORAL
  Filled 2015-04-10: qty 1

## 2015-04-10 MED ORDER — IBUPROFEN 600 MG PO TABS: 600.0000 mg | ORAL_TABLET | Freq: Four times a day (QID) | ORAL | Status: DC | PRN

## 2015-04-10 MED ORDER — SODIUM CHLORIDE 0.9 % IV BOLUS (SEPSIS)
1000.0000 mL | Freq: Once | INTRAVENOUS | Status: AC
  Administered 2015-04-10: 1000 mL via INTRAVENOUS

## 2015-04-10 NOTE — ED Provider Notes (Signed)
CSN: [id number]     Arrival date & time 04/10/15  0920 History   First MD Initiated Contact with Patient 04/10/15 [phone]     Chief Complaint  Patient presents with  . Generalized Body Aches  . Sore Throat    HPI   Robin Davis is a 34 y.o. female with a PMH of HTN, obesity, anxiety who presents to the ED with generalized body aches and sore throat, which she states started yesterday. She also reports headache, which she characterizes as pressure, nasal congestion, cough productive of yellow-green sputum, chest pain and back pain with coughing, and shortness of breath. She denies exacerbating factors. She has not tried anything for symptom relief. She reports chills, though denies fever. She denies abdominal pain, N/V/D/C, numbness, weakness, paresthesia, recent travel or immobility, recent surgery, history of DVT/PE, estrogen use, history of malignancy.   Past Medical History  Diagnosis Date  . Hypertension   . Foot pain   . Obesity   . Anxiety    Past Surgical History  Procedure Laterality Date  . Cholecystectomy    . Breast reduction surgery     History reviewed. No pertinent family history. Social History  Substance Use Topics  . Smoking status: Never Smoker   . Smokeless tobacco: None  . Alcohol Use: Yes     Comment: 4 times a year   OB History    Gravida Para Term Preterm AB TAB SAB Ectopic Multiple Living   Review of Systems  Constitutional: Positive for chills. Negative for fever.  HENT: Positive for congestion.   Respiratory: Positive for cough. Negative for shortness of breath.   Cardiovascular: Positive for chest pain. Negative for leg swelling.  Gastrointestinal: Negative for nausea, vomiting, abdominal pain, diarrhea and constipation.  Musculoskeletal: Positive for back pain.  Neurological: Positive for headaches. Negative for dizziness, syncope and light-headedness.  All other systems reviewed and are negative.     Allergies   Fish allergy; Macrobid; Flagyl; Tramadol; and Vicodin  Home Medications   Prior to Admission medications   Medication Sig Start Date End Date Taking? Authorizing Provider  albuterol (PROVENTIL HFA;VENTOLIN HFA) 108 (90 BASE) MCG/ACT inhaler Inhale 1-2 puffs into the lungs every 6 (six) hours as needed for wheezing or shortness of breath. 04/15/14   Elpidio Anis, PA-C  azithromycin (ZITHROMAX) 250 MG tablet Take 1 tablet (250 mg total) by mouth daily. Patient not taking: Reported on 08/20/2014 08/03/14   Tomasita Crumble, MD  benzonatate (TESSALON) 100 MG capsule Take 1 capsule (100 mg total) by mouth every 8 (eight) hours. 04/10/15   Mady Gemma, PA-C  Fexofenadine HCl (ALLEGRA PO) Take 1 tablet by mouth daily as needed (for allergy symptoms-OTC).    Historical Provider, MD  ibuprofen (ADVIL,MOTRIN) 600 MG tablet Take 1 tablet (600 mg total) by mouth every 6 (six) hours as needed. 04/10/15   Mady Gemma, PA-C  lisinopril-hydrochlorothiazide (PRINZIDE,ZESTORETIC) 10-12.5 MG per tablet Take 1 tablet by mouth daily.    Historical Provider, MD  naproxen sodium (ALEVE) 220 MG tablet Take 2 tablets twice a day as needed for chest wall pain. Patient not taking: Reported on 08/02/2014 04/18/13   Paula Libra, MD  phentermine (ADIPEX-P) 37.5 MG tablet Take 37.5 mg by mouth daily. 03/13/14   Historical Provider, MD    BP 119/84 mmHg  Pulse 95  Temp(Src) 98.5 F (36.9 C) (Oral)  Resp 29  SpO2  92%  LMP 04/01/2015 Physical Exam  Constitutional: She is oriented to person, place, and time. She appears well-developed and well-nourished. No distress.  HENT:  Head: Normocephalic and atraumatic.  Right Ear: External ear normal.  Left Ear: External ear normal.  Nose: Nose normal. Right sinus exhibits no maxillary sinus tenderness and no frontal sinus tenderness. Left sinus exhibits no maxillary sinus tenderness and no frontal sinus tenderness.  Mouth/Throat: Uvula is midline. Mucous membranes  are dry. No oropharyngeal exudate, posterior oropharyngeal edema, posterior oropharyngeal erythema or tonsillar abscesses.  Eyes: Conjunctivae, EOM and lids are normal. Pupils are equal, round, and reactive to light. Right eye exhibits no discharge. Left eye exhibits no discharge. No scleral icterus.  Neck: Normal range of motion. Neck supple.  Cardiovascular: Normal rate, regular rhythm, normal heart sounds, intact distal pulses and normal pulses.   Pulmonary/Chest: Effort normal and breath sounds normal. No respiratory distress. She has no wheezes. She has no rales. She exhibits tenderness.  Anterior chest wall tender to palpation, patient states this reproduces her pain with coughing.  Abdominal: Soft. Normal appearance and bowel sounds are normal. She exhibits no distension and no mass. There is no tenderness. There is no rigidity, no rebound and no guarding.  Musculoskeletal: Normal range of motion. She exhibits tenderness. She exhibits no edema.  Mild TTP of bilateral thoracic paraspinal muscles. No midline tenderness, step-off, or deformity.  Neurological: She is alert and oriented to person, place, and time. She has normal strength. No cranial nerve deficit or sensory deficit.  Skin: Skin is warm, dry and intact. No rash noted. She is not diaphoretic. No erythema. No pallor.  Psychiatric: She has a normal mood and affect. Her speech is normal and behavior is normal.  Nursing note and vitals reviewed.   ED Course  Procedures (including critical care time)  Labs Review Labs Reviewed  CBC WITH DIFFERENTIAL/PLATELET - Abnormal; Notable for the following:    RBC 5.30 (*)    Hemoglobin 15.1 (*)    Monocytes Absolute 1.3 (*)    All other components within normal limits  RAPID STREP SCREEN (NOT AT [facility])  CULTURE, GROUP A STREP  BASIC METABOLIC PANEL  I-STAT TROPOININ, ED    Imaging Review Dg Chest 2 View  04/10/2015  CLINICAL DATA:  Cough and sore throat ; chest pain EXAM: CHEST  2  VIEW COMPARISON:  August 20, 2014 FINDINGS: Lungs are clear. Heart size and pulmonary vascularity are normal. No adenopathy. No pneumothorax. No bone lesions. IMPRESSION: No edema or consolidation. Electronically Signed   By: Bretta Bang III M.D.   On: 04/10/2015 11:20     I have personally reviewed and evaluated these images and lab results as part of my medical decision-making.   EKG Interpretation   Date/Time:  Tuesday April 10 2015 10:33:12 EST Ventricular Rate:  107 PR Interval:  140 QRS Duration: 88 QT Interval:  378 QTC Calculation: 504 R Axis:   58 Text Interpretation:  Sinus tachycardia RSR' in V1 or V2, probably normal  variant Borderline T abnormalities, anterior leads Prolonged QT interval  No significant change was found Confirmed by Manus Gunning  MD, STEPHEN [id number])  on 04/10/2015 11:05:21 AM      MDM   Final diagnoses:  URI (upper respiratory infection)    34 year old female presents with generalized body aches and sore throat, which she states started yesterday. Reports headache, which she characterizes as pressure, nasal congestion, cough productive of yellow-green sputum, chest pain and back pain with coughing,  and shortness of breath. Reports chills, though denies fever. Denies abdominal pain, N/V/D/C, numbness, weakness, paresthesia, recent travel or immobility, recent surgery, history of DVT/PE, estrogen use, history of malignancy.  Patient is afebrile. Mildly tachycardic. Mucous membranes appear dry. Posterior oropharynx without significant erythema, edema, or exudate. Heart regular rhythm. Lungs clear to auscultation bilaterally. Anterior chest wall tender to palpation, patient states this reproduces her pain with coughing. Abdomen soft, nontender, nondistended. No lower extremity edema. Mild TTP of bilateral thoracic paraspinal muscles. No midline tenderness, step-off, or deformity.  Patient given ibuprofen, cough medicine, fluids.  EKG remarkable for  sinus tachycardia, no significant change found. Troponin negative. CBC negative for leukocytosis or anemia. BMP unremarkable. Rapid strep negative. Chest x-ray negative for edema or consolidation. HR improved to 90s s/p fluids.  Patient is non-toxic and well-appearing. She reports symptom improvement. Feel she is stable for discharge at this time. Symptoms most likely viral. Will treat with ibuprofen and cough medicine. Advised patient to increase fluid intake. Doubt ACS given unremarkable work-up in the ED and duration of time since symptom onset. No significant risk factors for PE. Chest pain reproducible on palpation of anterior chest wall, symptoms most likely musculoskeletal due to cough. Patient to follow-up with PCP this week. Return precautions discussed. Patient verbalizes her understanding and is in agreement with plan.  BP 119/84 mmHg  Pulse 95  Temp(Src) 98.5 F (36.9 C) (Oral)  Resp 29  SpO2 92%  LMP 04/01/2015    Mady GemmaElizabeth C Westfall, PA-C 04/10/15 1213  Glynn OctaveStephen Rancour, MD 04/10/15 [phone]

## 2015-04-10 NOTE — Discharge Instructions (Signed)
1. Medications: tessalon (cough medicine), motrin, usual home medications 2. Treatment: rest, drink plenty of fluids 3. Follow Up: please followup with your primary doctor this week for discussion of your diagnoses and further evaluation after today's visit; please return to the ER for high fever, chest pain, shortness of breath, new or worsening symptoms   Upper Respiratory Infection, Adult Most upper respiratory infections (URIs) are a viral infection of the air passages leading to the lungs. A URI affects the nose, throat, and upper air passages. The most common type of URI is nasopharyngitis and is typically referred to as "the common cold." URIs run their course and usually go away on their own. Most of the time, a URI does not require medical attention, but sometimes a bacterial infection in the upper airways can follow a viral infection. This is called a secondary infection. Sinus and middle ear infections are common types of secondary upper respiratory infections. Bacterial pneumonia can also complicate a URI. A URI can worsen asthma and chronic obstructive pulmonary disease (COPD). Sometimes, these complications can require emergency medical care and may be life threatening.  CAUSES Almost all URIs are caused by viruses. A virus is a type of germ and can spread from one person to another.  RISKS FACTORS You may be at risk for a URI if:   You smoke.   You have chronic heart or lung disease.  You have a weakened defense (immune) system.   You are very young or very old.   You have nasal allergies or asthma.  You work in crowded or poorly ventilated areas.  You work in health care facilities or schools. SIGNS AND SYMPTOMS  Symptoms typically develop 2-3 days after you come in contact with a cold virus. Most viral URIs last 7-10 days. However, viral URIs from the influenza virus (flu virus) can last 14-18 days and are typically more severe. Symptoms may include:   Runny or  stuffy (congested) nose.   Sneezing.   Cough.   Sore throat.   Headache.   Fatigue.   Fever.   Loss of appetite.   Pain in your forehead, behind your eyes, and over your cheekbones (sinus pain).  Muscle aches.  DIAGNOSIS  Your health care provider may diagnose a URI by:  Physical exam.  Tests to check that your symptoms are not due to another condition such as:  Strep throat.  Sinusitis.  Pneumonia.  Asthma. TREATMENT  A URI goes away on its own with time. It cannot be cured with medicines, but medicines may be prescribed or recommended to relieve symptoms. Medicines may help:  Reduce your fever.  Reduce your cough.  Relieve nasal congestion. HOME CARE INSTRUCTIONS   Take medicines only as directed by your health care provider.   Gargle warm saltwater or take cough drops to comfort your throat as directed by your health care provider.  Use a warm mist humidifier or inhale steam from a shower to increase air moisture. This may make it easier to breathe.  Drink enough fluid to keep your urine clear or pale yellow.   Eat soups and other clear broths and maintain good nutrition.   Rest as needed.   Return to work when your temperature has returned to normal or as your health care provider advises. You may need to stay home longer to avoid infecting others. You can also use a face mask and careful hand washing to prevent spread of the virus.  Increase the usage of your inhaler  if you have asthma.   Do not use any tobacco products, including cigarettes, chewing tobacco, or electronic cigarettes. If you need help quitting, ask your health care provider. PREVENTION  The best way to protect yourself from getting a cold is to practice good hygiene.   Avoid oral or hand contact with people with cold symptoms.   Wash your hands often if contact occurs.  There is no clear evidence that vitamin C, vitamin E, echinacea, or exercise reduces the chance  of developing a cold. However, it is always recommended to get plenty of rest, exercise, and practice good nutrition.  SEEK MEDICAL CARE IF:   You are getting worse rather than better.   Your symptoms are not controlled by medicine.   You have chills.  You have worsening shortness of breath.  You have brown or red mucus.  You have yellow or brown nasal discharge.  You have pain in your face, especially when you bend forward.  You have a fever.  You have swollen neck glands.  You have pain while swallowing.  You have white areas in the back of your throat. SEEK IMMEDIATE MEDICAL CARE IF:   You have severe or persistent:  Headache.  Ear pain.  Sinus pain.  Chest pain.  You have chronic lung disease and any of the following:  Wheezing.  Prolonged cough.  Coughing up blood.  A change in your usual mucus.  You have a stiff neck.  You have changes in your:  Vision.  Hearing.  Thinking.  Mood. MAKE SURE YOU:   Understand these instructions.  Will watch your condition.  Will get help right away if you are not doing well or get worse.   This information is not intended to replace advice given to you by your health care provider. Make sure you discuss any questions you have with your health care provider.   Document Released: 10/22/2000 Document Revised: 09/12/2014 Document Reviewed: 08/03/2013 Elsevier Interactive Patient Education [employer]

## 2015-04-10 NOTE — ED Notes (Signed)
Pt sts body aches and sore throat x 2 days; pt sts some chills and sweats

## 2015-04-13 LAB — CULTURE, GROUP A STREP: Strep A Culture: NEGATIVE

## 2015-05-12 ENCOUNTER — Encounter (HOSPITAL_COMMUNITY): Payer: Self-pay | Admitting: *Deleted

## 2015-05-12 ENCOUNTER — Emergency Department (HOSPITAL_COMMUNITY): Payer: Medicaid Other

## 2015-05-12 ENCOUNTER — Emergency Department (HOSPITAL_COMMUNITY)
Admission: EM | Admit: 2015-05-12 | Discharge: 2015-05-12 | Disposition: A | Discharge status: Home / Self Care | Payer: Medicaid Other | Attending: Emergency Medicine | Admitting: Emergency Medicine

## 2015-05-12 DIAGNOSIS — J209 Acute bronchitis, unspecified: Secondary | ICD-10-CM | POA: Diagnosis not present

## 2015-05-12 DIAGNOSIS — R000 Tachycardia, unspecified: Secondary | ICD-10-CM | POA: Diagnosis not present

## 2015-05-12 DIAGNOSIS — Z79899 Other long term (current) drug therapy: Secondary | ICD-10-CM | POA: Diagnosis not present

## 2015-05-12 DIAGNOSIS — R1110 Vomiting, unspecified: Secondary | ICD-10-CM | POA: Diagnosis not present

## 2015-05-12 DIAGNOSIS — R05 Cough: Secondary | ICD-10-CM | POA: Diagnosis present

## 2015-05-12 DIAGNOSIS — E669 Obesity, unspecified: Secondary | ICD-10-CM | POA: Insufficient documentation

## 2015-05-12 DIAGNOSIS — I10 Essential (primary) hypertension: Secondary | ICD-10-CM | POA: Diagnosis not present

## 2015-05-12 DIAGNOSIS — Z8659 Personal history of other mental and behavioral disorders: Secondary | ICD-10-CM | POA: Insufficient documentation

## 2015-05-12 IMAGING — DX DG CHEST 2V
2 series · 2 of 2 positions shown · non-contrast
Comparison: Chest radiograph performed 04/10/2015

CLINICAL DATA: Acute onset of cough.  Initial encounter.

EXAM:
CHEST  2 VIEW

[chest pa]
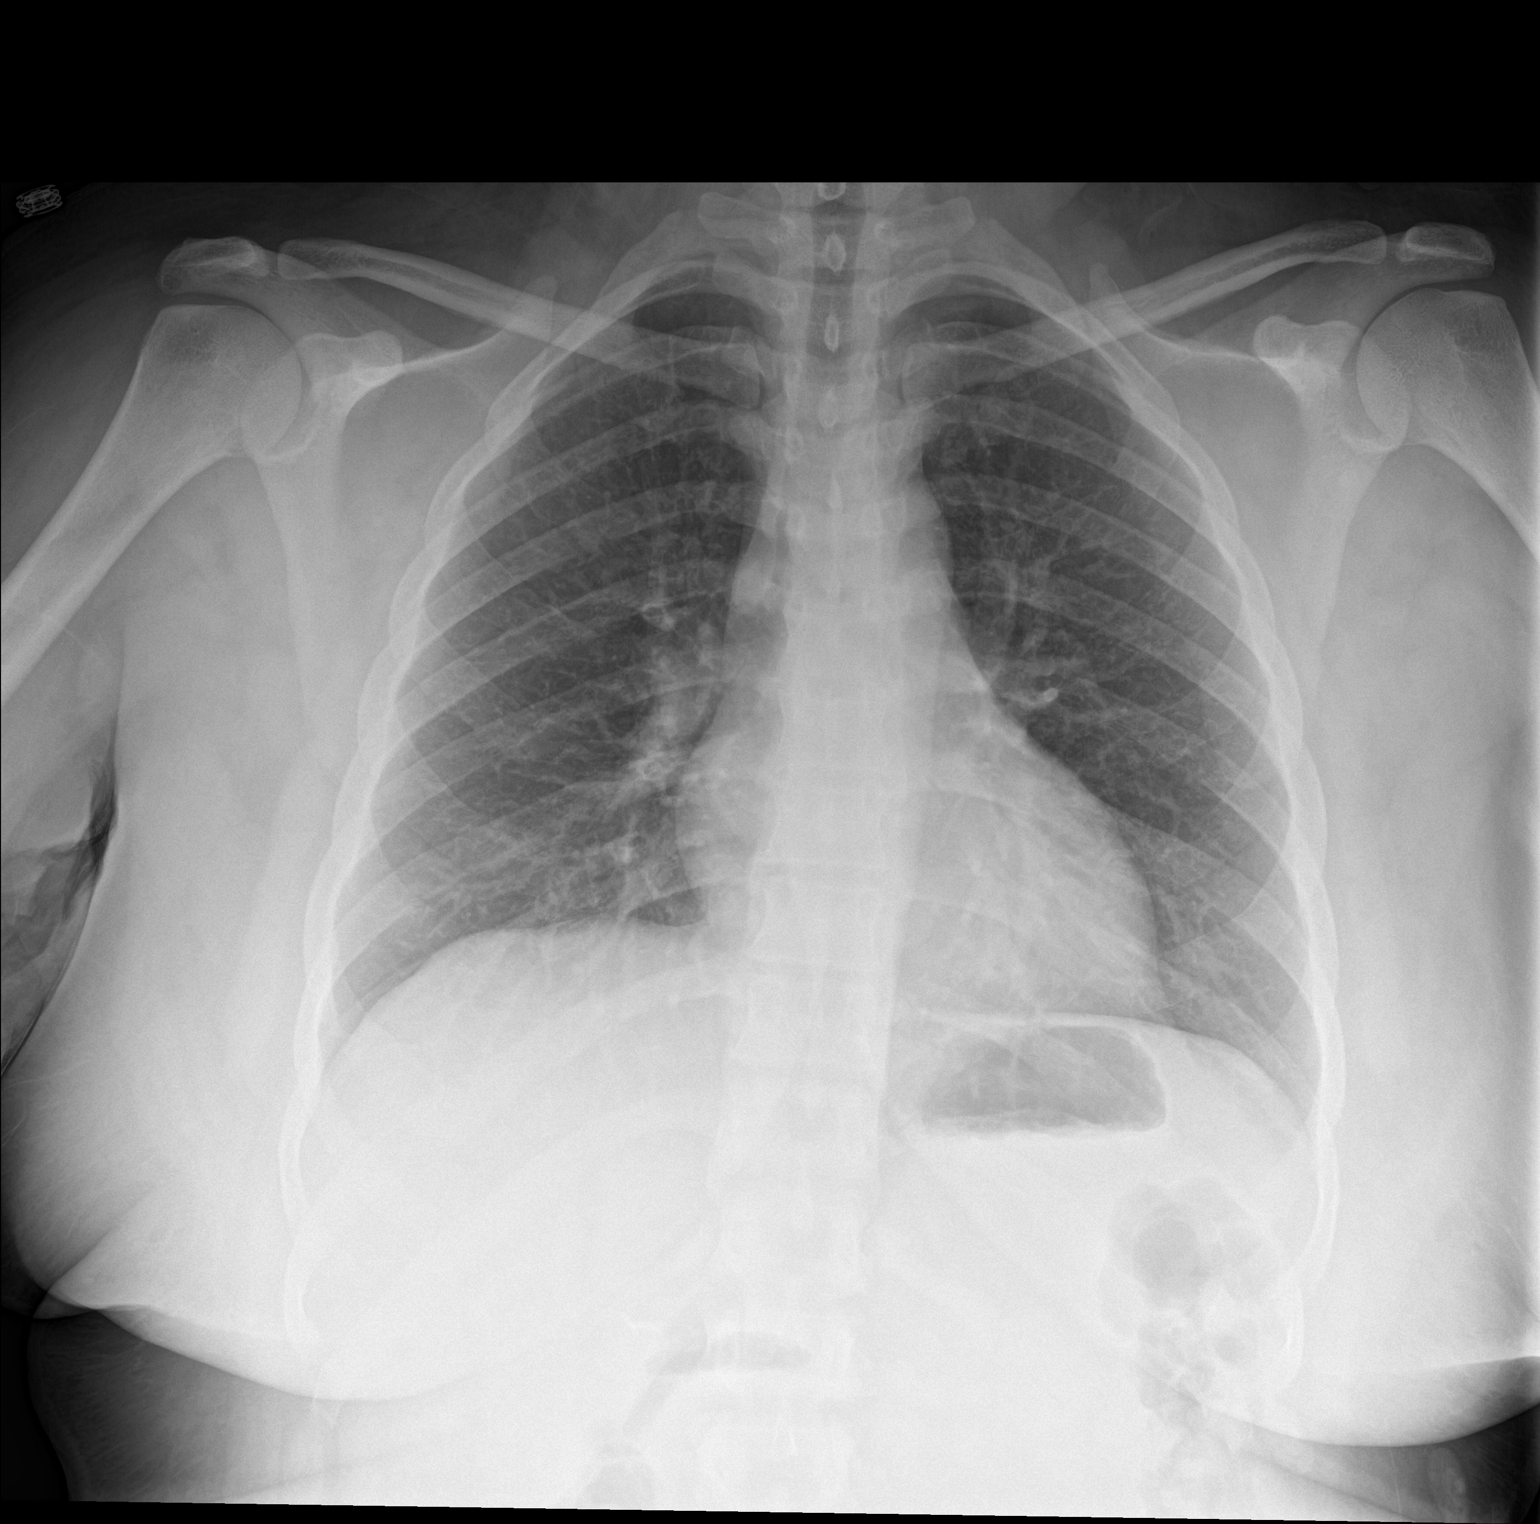

[chest lat]
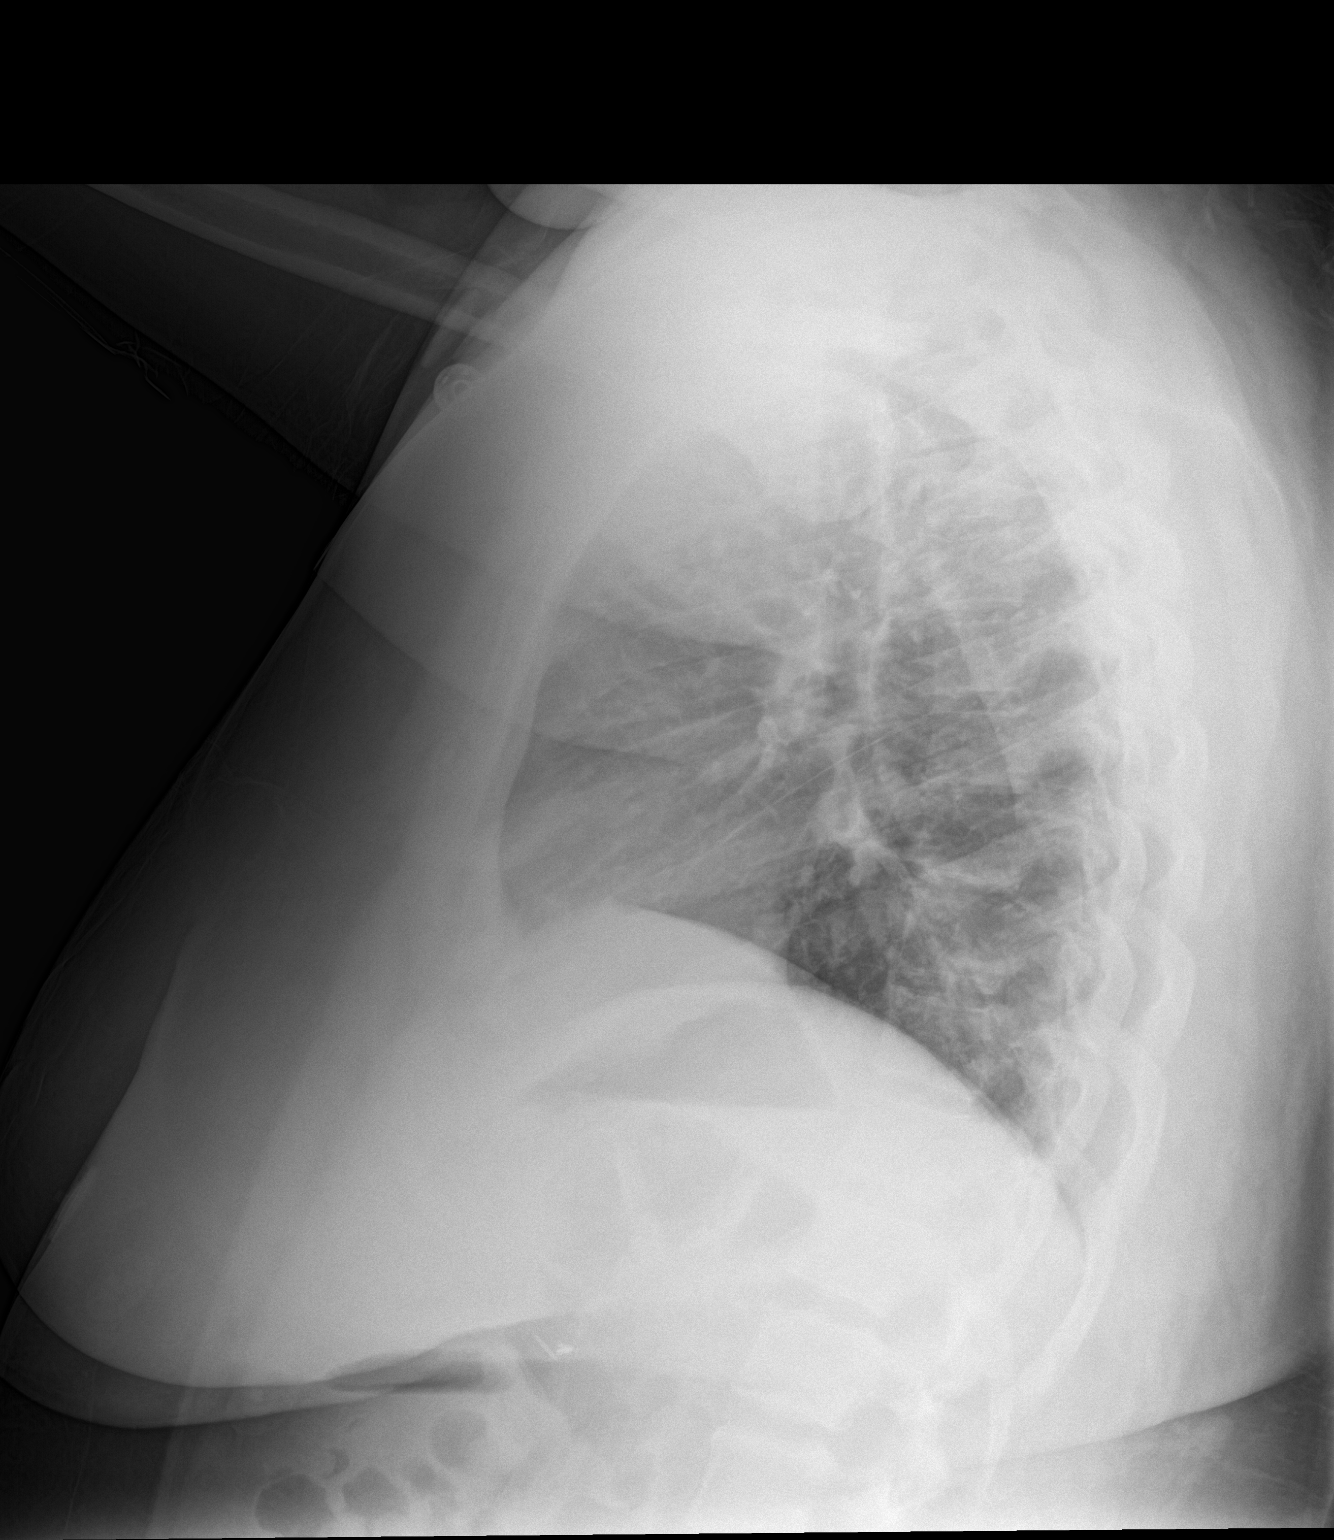

[2 of 2 positions shown; findings below may reference images not displayed]

FINDINGS: The lungs are well-aerated and clear. There is no evidence of focal
opacification, pleural effusion or pneumothorax.

The heart is normal in size; the mediastinal contour is within
normal limits. No acute osseous abnormalities are seen. Clips are
noted within the right upper quadrant, reflecting prior
cholecystectomy.
IMPRESSION: No acute cardiopulmonary process seen.

## 2015-05-12 NOTE — ED Provider Notes (Signed)
CSN: [id number]     Arrival date & time 05/12/15  0239 History  By signing my name below, I, Robin Davis, attest that this documentation has been prepared under the direction and in the presence of Blane OharaJoshua Denine Brotz, MD. Electronically Signed: Phillis HaggisGabriella Davis, ED Scribe. 05/12/2015. 3:47 AM.  Chief Complaint  Patient presents with  . Cough   The history is provided by the patient. No language interpreter was used.  HPI Comments: Robin Davis is a 34 y.o. Female with a hx of HTN who presents to the Emergency Department complaining of productive cough with thin white-green sputum onset 4 days ago. Pt has been taking Alka Seltzer Cold and Sinus to no relief. Pt just had an episode of post tussive emesis prior to speaking with MD. Pt denies fever or chills. LMP one week ago.   Past Medical History  Diagnosis Date  . Hypertension   . Foot pain   . Obesity   . Anxiety    Past Surgical History  Procedure Laterality Date  . Cholecystectomy    . Breast reduction surgery     No family history on file. Social History  Substance Use Topics  . Smoking status: Never Smoker   . Smokeless tobacco: None  . Alcohol Use: Yes     Comment: 4 times a year   OB History    Gravida Para Term Preterm AB TAB SAB Ectopic Multiple Living   5 3 2 1 2     2      Review of Systems  Constitutional: Negative for fever and chills.  Respiratory: Positive for cough.   Gastrointestinal: Positive for vomiting.  All other systems reviewed and are negative.  Allergies  Fish allergy; Macrobid; Flagyl; Tramadol; and Vicodin  Home Medications   Prior to Admission medications   Medication Sig Start Date End Date Taking? Authorizing Provider  albuterol (PROVENTIL HFA;VENTOLIN HFA) 108 (90 BASE) MCG/ACT inhaler Inhale 1-2 puffs into the lungs every 6 (six) hours as needed for wheezing or shortness of breath. 04/15/14  Yes Shari Upstill, PA-C  lisinopril-hydrochlorothiazide (PRINZIDE,ZESTORETIC) 10-12.5 MG per  tablet Take 1 tablet by mouth daily.   Yes Historical Provider, MD  phentermine (ADIPEX-P) 37.5 MG tablet Take 37.5 mg by mouth daily. 03/13/14  Yes Historical Provider, MD  benzonatate (TESSALON) 100 MG capsule Take 1 capsule (100 mg total) by mouth every 8 (eight) hours. Patient not taking: Reported on 05/12/2015 04/10/15   Mady GemmaElizabeth C Westfall, PA-C  ibuprofen (ADVIL,MOTRIN) 600 MG tablet Take 1 tablet (600 mg total) by mouth every 6 (six) hours as needed. Patient not taking: Reported on 05/12/2015 04/10/15   Mady GemmaElizabeth C Westfall, PA-C  naproxen sodium (ALEVE) 220 MG tablet Take 2 tablets twice a day as needed for chest wall pain. Patient not taking: Reported on 08/02/2014 04/18/13   Paula LibraJohn Molpus, MD   BP 112/63 mmHg  Pulse 108  Temp(Src) 98.8 F (37.1 C)  Resp 22  Ht 5\' 3"  (1.6 m)  Wt 207 lb (93.895 kg)  BMI 36.68 kg/m2  SpO2 97%  LMP 05/09/2015 Physical Exam  Constitutional: She is oriented to person, place, and time. She appears well-developed and well-nourished.  HENT:  Head: Normocephalic and atraumatic.  Eyes: EOM are normal. Pupils are equal, round, and reactive to light.  Neck: Normal range of motion. Neck supple.  Cardiovascular: Regular rhythm and normal heart sounds.  Tachycardia present.  Exam reveals no gallop and no friction rub.   No murmur heard. Mild tachycardia  Pulmonary/Chest:  Effort normal and breath sounds normal. She has no wheezes.  Moving air well; clear bilaterally  Abdominal: Soft. There is no tenderness.  Musculoskeletal: Normal range of motion.  Neurological: She is alert and oriented to person, place, and time.  Skin: Skin is warm and dry.  Psychiatric: She has a normal mood and affect. Her behavior is normal.  Nursing note and vitals reviewed.   ED Course  Procedures (including critical care time) DIAGNOSTIC STUDIES: Oxygen Saturation is 98% on RA, normal by my interpretation.    COORDINATION OF CARE: 3:46 AM-Discussed treatment plan which  includes chest x-ray with pt at bedside and pt agreed to plan.    Labs Review Labs Reviewed - No data to display  Imaging Review No results found. I have personally reviewed and evaluated these images and lab results as part of my medical decision-making.   EKG Interpretation   Date/Time:  Saturday May 12 2015 02:48:22 EST Ventricular Rate:  104 PR Interval:  152 QRS Duration: 92 QT Interval:  344 QTC Calculation: 452 R Axis:   66 Text Interpretation:  Sinus tachycardia Nonspecific T wave abnormality  Abnormal ECG Confirmed by Brinlee Gambrell  MD, Derrica Sieg ([id number]) on 05/12/2015 3:09:25  AM      MDM   Final diagnoses:  Acute bronchitis, unspecified organism   Patient presents with clinically bronchitis. Chest x-ray reviewed no acute findings. Patient had one episode of vomiting after recurrent cough.  Results and differential diagnosis were discussed with the patient/parent/guardian. Xrays were independently reviewed by myself.  Close follow up outpatient was discussed, comfortable with the plan.   Medications - No data to display  Filed Vitals:   05/12/15 0247 05/12/15 0347 05/12/15 0348  BP: 127/65 112/63   Pulse: 103  108  Temp: 98.8 F (37.1 C)    Resp: 22    Height:  (1.6 m)    Weight: 207 lb (93.895 kg)    SpO2: 98%  97%    Final diagnoses:  Acute bronchitis, unspecified organism      Blane Ohara, MD 05/12/15 0430

## 2015-05-12 NOTE — Discharge Instructions (Signed)
If you were given medicines take as directed.  If you are on coumadin or contraceptives realize their levels and effectiveness is altered by many different medicines.  If you have any reaction (rash, tongues swelling, other) to the medicines stop taking and see a physician.   If cough persists it may be secondary to lisinopril blood pressure medication. You'll need to stop it and see her doctor. If your blood pressure was elevated in the ER make sure you follow up for management with a primary doctor or return for chest pain, shortness of breath or stroke symptoms.  Please follow up as directed and return to the ER or see a physician for new or worsening symptoms.  Thank you. Filed Vitals:   05/12/15 0247 05/12/15 0347 05/12/15 0348  BP: 127/65 112/63   Pulse: 103  108  Temp: 98.8 F (37.1 C)    Resp: 22    Height: 5\' 3"  (1.6 m)    Weight: 207 lb (93.895 kg)    SpO2: 98%  97%

## 2015-05-12 NOTE — ED Notes (Signed)
Pt has been coughing for 4 days  Productive thin white and greenish.  Maybe a temp.  Cant sleep.  lmp last week

## 2015-10-07 ENCOUNTER — Emergency Department (HOSPITAL_COMMUNITY)
Admission: EM | Admit: 2015-10-07 | Discharge: 2015-10-07 | Disposition: A | Discharge status: Home / Self Care | Payer: Medicaid Other | Attending: Emergency Medicine | Admitting: Emergency Medicine

## 2015-10-07 ENCOUNTER — Encounter (HOSPITAL_COMMUNITY): Payer: Self-pay | Admitting: *Deleted

## 2015-10-07 ENCOUNTER — Emergency Department (HOSPITAL_COMMUNITY): Payer: Medicaid Other

## 2015-10-07 DIAGNOSIS — I10 Essential (primary) hypertension: Secondary | ICD-10-CM | POA: Diagnosis not present

## 2015-10-07 DIAGNOSIS — E669 Obesity, unspecified: Secondary | ICD-10-CM | POA: Diagnosis not present

## 2015-10-07 DIAGNOSIS — R067 Sneezing: Secondary | ICD-10-CM | POA: Diagnosis not present

## 2015-10-07 DIAGNOSIS — Z79899 Other long term (current) drug therapy: Secondary | ICD-10-CM | POA: Insufficient documentation

## 2015-10-07 DIAGNOSIS — R05 Cough: Secondary | ICD-10-CM | POA: Insufficient documentation

## 2015-10-07 DIAGNOSIS — H9203 Otalgia, bilateral: Secondary | ICD-10-CM | POA: Diagnosis not present

## 2015-10-07 DIAGNOSIS — L299 Pruritus, unspecified: Secondary | ICD-10-CM | POA: Diagnosis present

## 2015-10-07 DIAGNOSIS — Z8659 Personal history of other mental and behavioral disorders: Secondary | ICD-10-CM | POA: Insufficient documentation

## 2015-10-07 IMAGING — CR DG CHEST 2V
2 series · 2 of 2 positions shown · non-contrast
Comparison: Chest radiograph performed 05/12/2015

CLINICAL DATA: Acute onset of cough and shortness of breath.
Initial encounter.

EXAM:
CHEST  2 VIEW

[chest pa]
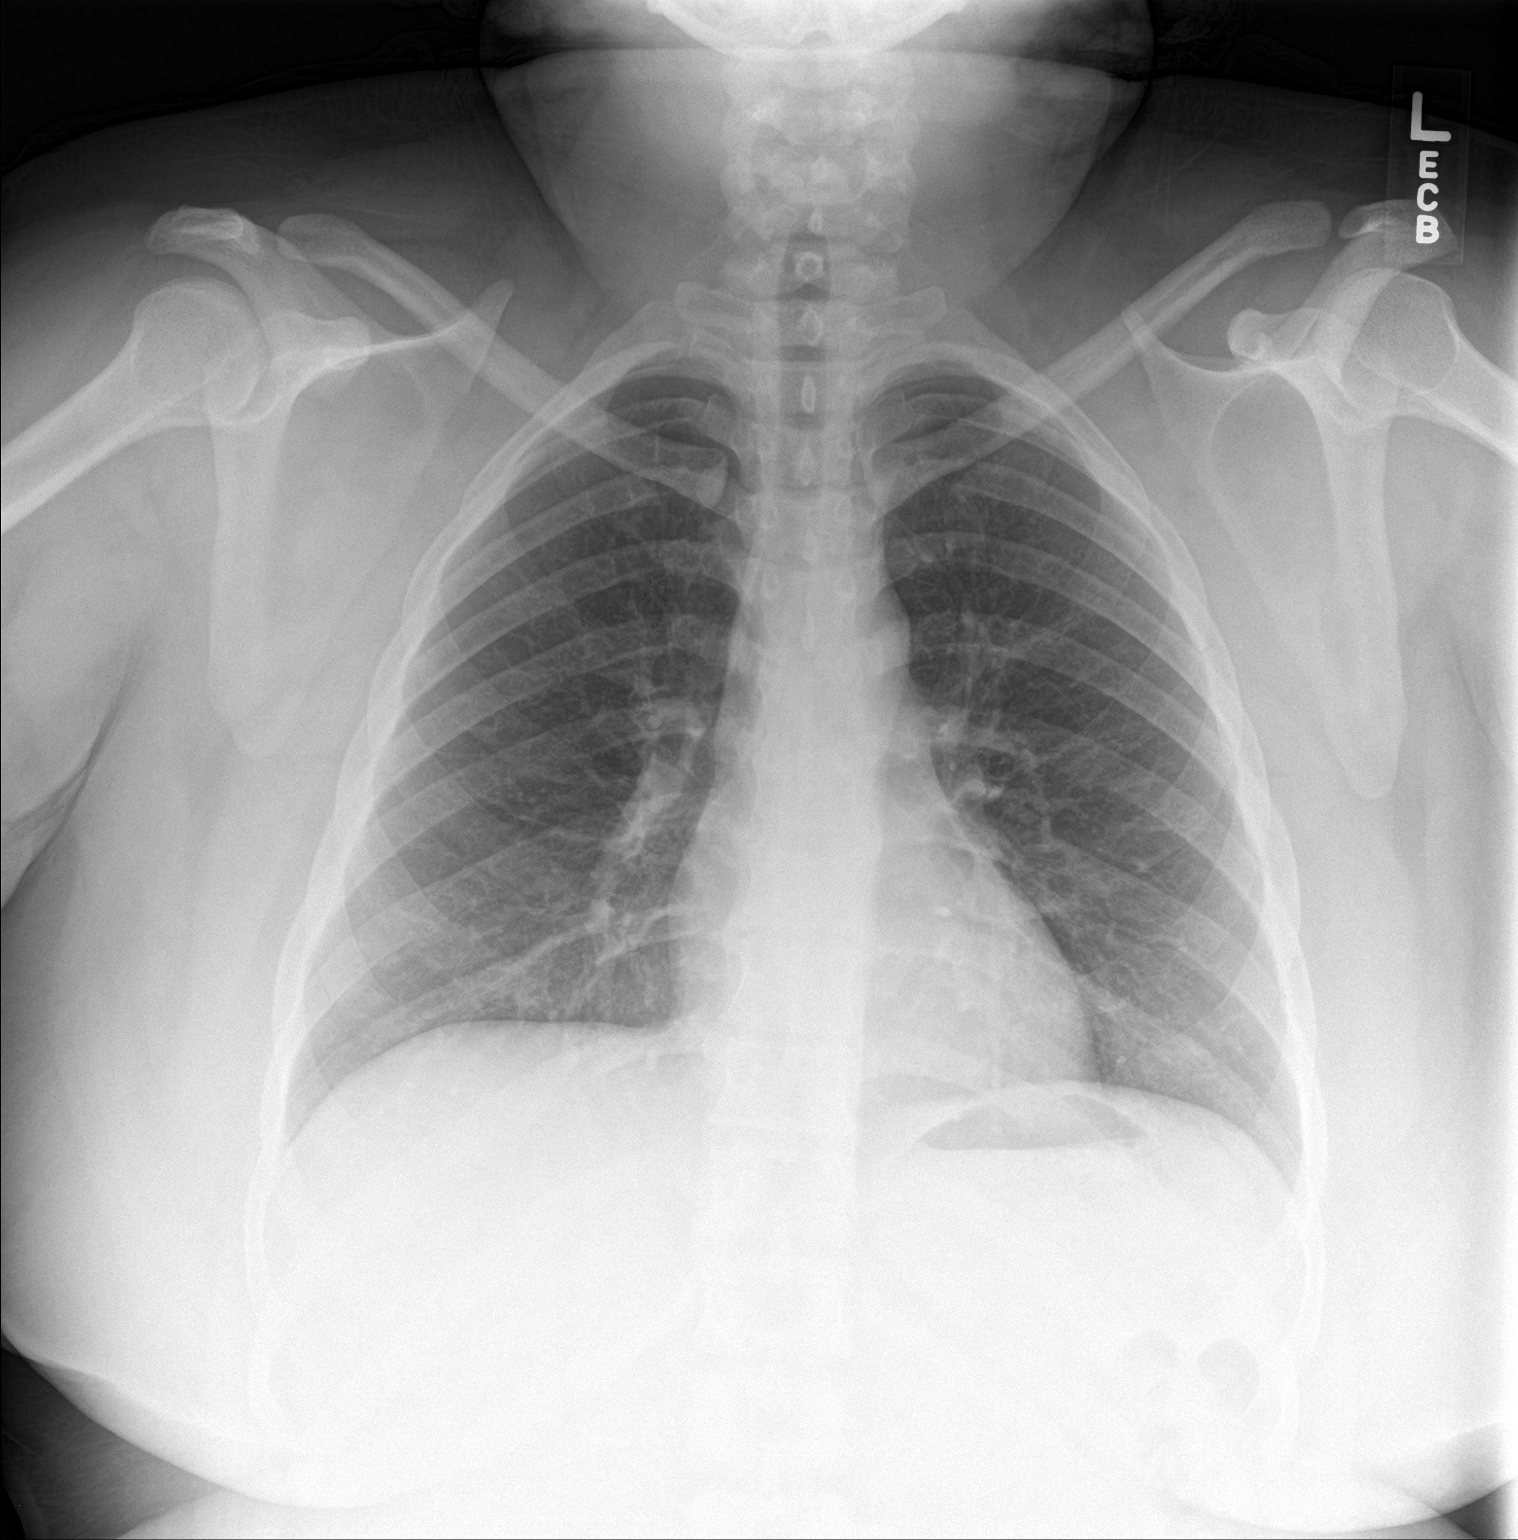

[chest lat]
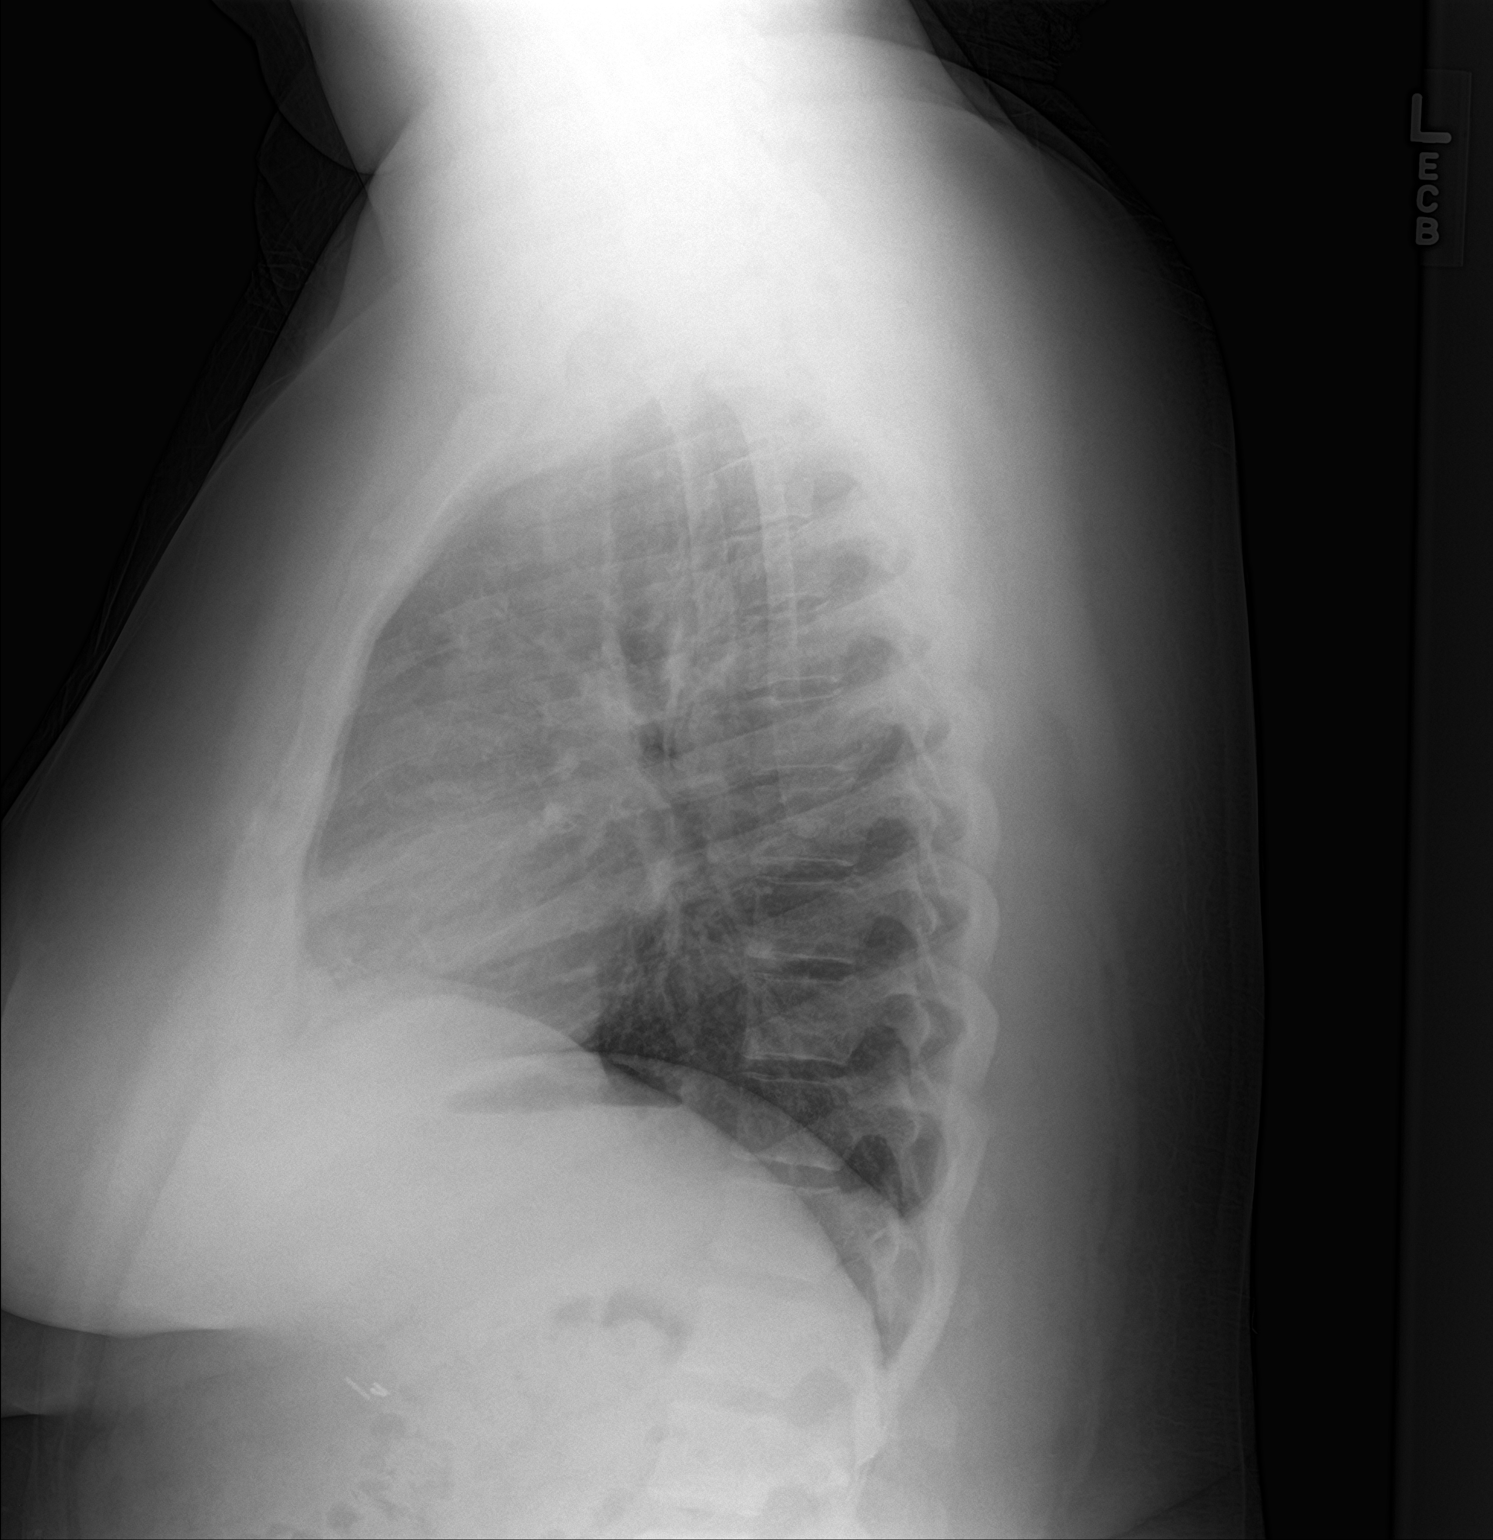

[2 of 2 positions shown; findings below may reference images not displayed]

FINDINGS: The lungs are well-aerated and clear. There is no evidence of focal
opacification, pleural effusion or pneumothorax.

The heart is normal in size; the mediastinal contour is within
normal limits. No acute osseous abnormalities are seen. Clips are
noted within the right upper quadrant, reflecting prior
cholecystectomy.
IMPRESSION: No acute cardiopulmonary process seen.

## 2015-10-07 MED ORDER — CETIRIZINE-PSEUDOEPHEDRINE ER 5-120 MG PO TB12: 1.0000 | ORAL_TABLET | Freq: Two times a day (BID) | ORAL | Status: DC

## 2015-10-07 MED ORDER — HYDROXYZINE HCL 25 MG PO TABS: 25.0000 mg | ORAL_TABLET | Freq: Four times a day (QID) | ORAL | Status: DC | PRN

## 2015-10-07 MED ORDER — HYDROXYZINE HCL 25 MG PO TABS
25.0000 mg | ORAL_TABLET | Freq: Once | ORAL | Status: AC
  Administered 2015-10-07: 25 mg via ORAL
  Filled 2015-10-07: qty 1

## 2015-10-07 NOTE — ED Notes (Signed)
Pt reports bil. Ear pain started on SAT.

## 2015-10-07 NOTE — ED Provider Notes (Signed)
CSN: [id number]     Arrival date & time 10/07/15  1732 History   First MD Initiated Contact with Patient 10/07/15 1803     Chief Complaint  Patient presents with  . Otalgia     (Consider location/radiation/quality/duration/timing/severity/associated sxs/prior Treatment) HPI   35 year old obese female with history of hypertension anxiety presents complaining of allergic reaction. Patient states since yesterday she developed itchiness in her ears, eyes, and her throat that has been persistent. She also endorses having cough productive with yellow sputum and occasional sneezing. She recently Back from a beach trip. She is also fairly taking clindamycin for a vaginal infection and nearly at the end of her antibody course and states that her infection has resolved. She denies any other changes such as soap, detergent, body wash, new pets. She has pneumonia in the past and the cough is concerning to her. She however denies having fever, hemoptysis, shortness of breath, abdominal cramping, nausea vomiting or diarrhea, or rash. No specific treatment tried. Under chief complaints, it was listed as otalgia however patient denies having any ear pain.  Past Medical History  Diagnosis Date  . Hypertension   . Foot pain   . Obesity   . Anxiety    Past Surgical History  Procedure Laterality Date  . Cholecystectomy    . Breast reduction surgery     History reviewed. No pertinent family history. Social History  Substance Use Topics  . Smoking status: Never Smoker   . Smokeless tobacco: None  . Alcohol Use: Yes     Comment: 4 times a year   OB History    Gravida Para Term Preterm AB TAB SAB Ectopic Multiple Living   5 3 2 1 2     2      Review of Systems  Constitutional: Negative for fever.  All other systems reviewed and are negative.     Allergies  Fish allergy; Macrobid; Flagyl; Tramadol; and Vicodin  Home Medications   Prior to Admission medications   Medication Sig Start Date  End Date Taking? Authorizing Provider  albuterol (PROVENTIL HFA;VENTOLIN HFA) 108 (90 BASE) MCG/ACT inhaler Inhale 1-2 puffs into the lungs every 6 (six) hours as needed for wheezing or shortness of breath. 04/15/14   Elpidio AnisShari Upstill, PA-C  benzonatate (TESSALON) 100 MG capsule Take 1 capsule (100 mg total) by mouth every 8 (eight) hours. Patient not taking: Reported on 05/12/2015 04/10/15   Mady GemmaElizabeth C Westfall, PA-C  ibuprofen (ADVIL,MOTRIN) 600 MG tablet Take 1 tablet (600 mg total) by mouth every 6 (six) hours as needed. Patient not taking: Reported on 05/12/2015 04/10/15   Mady GemmaElizabeth C Westfall, PA-C  lisinopril-hydrochlorothiazide (PRINZIDE,ZESTORETIC) 10-12.5 MG per tablet Take 1 tablet by mouth daily.    Historical Provider, MD  naproxen sodium (ALEVE) 220 MG tablet Take 2 tablets twice a day as needed for chest wall pain. Patient not taking: Reported on 08/02/2014 04/18/13   Paula LibraJohn Molpus, MD  phentermine (ADIPEX-P) 37.5 MG tablet Take 37.5 mg by mouth daily. 03/13/14   Historical Provider, MD   BP 139/85 mmHg  Pulse 104  Temp(Src) 97.9 F (36.6 C) (Oral)  Resp 20  Ht 5\' 2"  (1.575 m)  Wt 101.606 kg  BMI 40.96 kg/m2  SpO2 100%  LMP 09/16/2015 Physical Exam  Constitutional: She is oriented to person, place, and time. She appears well-developed and well-nourished. No distress.  Obese African-American female sitting in bed nontoxic in appearance  HENT:  Head: Normocephalic and atraumatic.  Right Ear: External ear normal.  Left Ear: External ear normal.  Nose: Nose normal.  Mouth/Throat: Oropharynx is clear and moist.  Ears: No erythema, no ear lobe swelling Nose: Normal nares Throat: Uvula is midline no tonsillar enlargement or exudates, no tongue swelling  Eyes: No eyelid edema, no conjunctivitis  Eyes: Conjunctivae and EOM are normal. Pupils are equal, round, and reactive to light.  Neck: Neck supple.  No nuchal rigidity  Cardiovascular: Normal rate and regular rhythm.    Pulmonary/Chest: Effort normal and breath sounds normal.  Abdominal: Soft. There is no tenderness.  Neurological: She is alert and oriented to person, place, and time.  Skin: No rash noted.  Psychiatric: She has a normal mood and affect.  Nursing note and vitals reviewed.   ED Course  Procedures (including critical care time) Labs Review Labs Reviewed - No data to display  Imaging Review Dg Chest 2 View  10/07/2015  CLINICAL DATA:  Acute onset of cough and shortness of breath. Initial encounter. EXAM: CHEST  2 VIEW COMPARISON:  Chest radiograph performed 05/12/2015 FINDINGS: The lungs are well-aerated and clear. There is no evidence of focal opacification, pleural effusion or pneumothorax. The heart is normal in size; the mediastinal contour is within normal limits. No acute osseous abnormalities are seen. Clips are noted within the right upper quadrant, reflecting prior cholecystectomy. IMPRESSION: No acute cardiopulmonary process seen. Electronically Signed   By: Roanna Raider M.D.   On: 10/07/2015 18:59   I have personally reviewed and evaluated these images and lab results as part of my medical decision-making.   EKG Interpretation None      MDM   Final diagnoses:  Pruritus    BP 139/85 mmHg  Pulse 104  Temp(Src) 97.9 F (36.6 C) (Oral)  Resp 20  Ht  (1.575 m)  Wt 101.606 kg  BMI 40.96 kg/m2  SpO2 100%  LMP 09/16/2015   6:22 PM Patient complaining of itching in ears, eyes, and throat and was concerned of potential allergic reaction to unknown source. She is currently taking clindamycin but states she has been taking this medication sometime in the past without a problem. She is also forcing concerning for pneumonia as she is having productive cough however she denies having any fever or shortness of breath. Chest exam is unremarkable however chest x-ray ordered to rule out pneumonia. Vistaril given for itchiness and anxiety. No evidence of urticaria hives on  exam.  Fayrene Helper, PA-C 10/07/15 1936  Lyndal Pulley, MD 10/08/15 [phone]

## 2015-10-07 NOTE — Discharge Instructions (Signed)
Pruritus Pruritus is an itching feeling. There are many different conditions and factors that can make your skin itchy. Dry skin is one of the most common causes of itching. Most cases of itching do not require medical attention. Itchy skin can turn into a rash.  HOME CARE INSTRUCTIONS  Watch your pruritus for any changes. Take these steps to help with your condition:  Skin Care  Moisturize your skin as needed. A moisturizer that contains petroleum jelly is best for keeping moisture in your skin.  Take or apply medicines only as directed by your health care provider. This may include:  Corticosteroid cream.  Anti-itch lotions.  Oral anti-histamines.  Apply cool compresses to the affected areas.  Try taking a bath with:  Epsom salts. Follow the instructions on the packaging. You can get these at your local pharmacy or grocery store.  Baking soda. Pour a small amount into the bath as directed by your health care provider.  Colloidal oatmeal. Follow the instructions on the packaging. You can get this at your local pharmacy or grocery store.  Try applying baking soda paste to your skin. Stir water into baking soda until it reaches a paste-like consistency.   Do not scratch your skin.  Avoid hot showers or baths, which can make itching worse. A cold shower may help with itching as long as you use a moisturizer after.  Avoid scented soaps, detergents, and perfumes. Use gentle soaps, detergents, perfumes, and other cosmetic products. General Instructions  Avoid wearing tight clothes.  Keep a journal to help track what causes your itch. Write down:  What you eat.  What cosmetic products you use.  What you drink.  What you wear. This includes jewelry.  Use a humidifier. This keeps the air moist, which helps to prevent dry skin. SEEK MEDICAL CARE IF:  The itching does not go away after several days.  You sweat at night.  You have weight loss.  You are unusually  thirsty.  You urinate more than normal.  You are more tired than normal.  You have abdominal pain.  Your skin tingles.  You feel weak.  Your skin or the whites of your eyes look yellow (jaundice).  Your skin feels numb.   This information is not intended to replace advice given to you by your health care provider. Make sure you discuss any questions you have with your health care provider.   Document Released: 01/08/2011 Document Revised: 09/12/2014 Document Reviewed: 04/24/2014 Elsevier Interactive Patient Education [employer]  Allergies An allergy is when your body reacts to a substance in a way that is not normal. An allergic reaction can happen after you:  Eat something.  Breathe in something.  Touch something. WHAT KINDS OF ALLERGIES ARE THERE? You can be allergic to:  Things that are only around during certain seasons, like molds and pollens.  Foods.  Drugs.  Insects.  Animal dander. WHAT ARE SYMPTOMS OF ALLERGIES?  Puffiness (swelling). This may happen on the lips, face, tongue, mouth, or throat.  Sneezing.  Coughing.  Breathing loudly (wheezing).  Stuffy nose.  Tingling in the mouth.  A rash.  Itching.  Itchy, red, puffy areas of skin (hives).  Watery eyes.  Throwing up (vomiting).  Watery poop (diarrhea).  Dizziness.  Feeling faint or fainting.  Trouble breathing or swallowing.  A tight feeling in the chest.  A fast heartbeat. HOW ARE ALLERGIES DIAGNOSED? Allergies can be diagnosed with:  A medical and family history.  Skin tests.  Blood tests.  A food diary. A food diary is a record of all the foods, drinks, and symptoms you have each day.  The results of an elimination diet. This diet involves making sure not to eat certain foods and then seeing what happens when you start eating them again. HOW ARE ALLERGIES TREATED? There is no cure for allergies, but allergic reactions can be treated with medicine. Severe  reactions usually need to be treated at a hospital.  HOW CAN REACTIONS BE PREVENTED? The best way to prevent an allergic reaction is to avoid the thing you are allergic to. Allergy shots and medicines can also help prevent reactions in some cases.   This information is not intended to replace advice given to you by your health care provider. Make sure you discuss any questions you have with your health care provider.   Document Released: 08/23/2012 Document Revised: 05/19/2014 Document Reviewed: 02/07/2014 Elsevier Interactive Patient Education [employer]

## 2015-11-17 ENCOUNTER — Emergency Department (HOSPITAL_COMMUNITY)
Admission: EM | Admit: 2015-11-17 | Discharge: 2015-11-17 | Disposition: A | Discharge status: Home / Self Care | Payer: Medicaid Other | Attending: Emergency Medicine | Admitting: Emergency Medicine

## 2015-11-17 ENCOUNTER — Encounter (HOSPITAL_COMMUNITY): Payer: Self-pay

## 2015-11-17 ENCOUNTER — Emergency Department (HOSPITAL_COMMUNITY): Payer: Medicaid Other

## 2015-11-17 DIAGNOSIS — I10 Essential (primary) hypertension: Secondary | ICD-10-CM | POA: Insufficient documentation

## 2015-11-17 DIAGNOSIS — J189 Pneumonia, unspecified organism: Secondary | ICD-10-CM | POA: Insufficient documentation

## 2015-11-17 DIAGNOSIS — R05 Cough: Secondary | ICD-10-CM | POA: Diagnosis present

## 2015-11-17 LAB — CBC WITH DIFFERENTIAL/PLATELET
BASOS PCT: 0 %
Basophils Absolute: 0 10*3/uL (ref 0.0–0.1)
EOS ABS: 0.3 10*3/uL (ref 0.0–0.7)
Eosinophils Relative: 4 %
HEMATOCRIT: 41.1 % (ref 36.0–46.0)
HEMOGLOBIN: 13.7 g/dL (ref 12.0–15.0)
LYMPHS ABS: 2 10*3/uL (ref 0.7–4.0)
Lymphocytes Relative: 29 %
MCH: 27.8 pg (ref 26.0–34.0)
MCHC: 33.3 g/dL (ref 30.0–36.0)
MCV: 83.4 fL (ref 78.0–100.0)
Monocytes Absolute: 1.6 10*3/uL — ABNORMAL HIGH (ref 0.1–1.0)
Monocytes Relative: 23 %
NEUTROS ABS: 3.1 10*3/uL (ref 1.7–7.7)
NEUTROS PCT: 44 %
Platelets: 284 10*3/uL (ref 150–400)
RBC: 4.93 MIL/uL (ref 3.87–5.11)
RDW: 13.8 % (ref 11.5–15.5)
WBC: 7 10*3/uL (ref 4.0–10.5)

## 2015-11-17 LAB — BASIC METABOLIC PANEL
Anion gap: 6 (ref 5–15)
BUN: 8 mg/dL (ref 6–20)
CHLORIDE: 102 mmol/L (ref 101–111)
CO2: 27 mmol/L (ref 22–32)
Calcium: 9.4 mg/dL (ref 8.9–10.3)
Creatinine, Ser: 0.82 mg/dL (ref 0.44–1.00)
GFR calc non Af Amer: >60 mL/min (ref >60)
Glucose, Bld: 162 mg/dL — ABNORMAL HIGH (ref 65–99)
POTASSIUM: 3.2 mmol/L — AB (ref 3.5–5.1)
SODIUM: 135 mmol/L (ref 135–145)

## 2015-11-17 IMAGING — DX DG CHEST 2V
2 series · 2 of 2 positions shown · non-contrast
Comparison: Chest x-ray 10/07/2015.

CLINICAL DATA: 34-year-old female with history of dry cough and
body aches for 1 day. Bilateral ear pain. Low-grade fever.

EXAM:
CHEST  2 VIEW

[w chest pa]
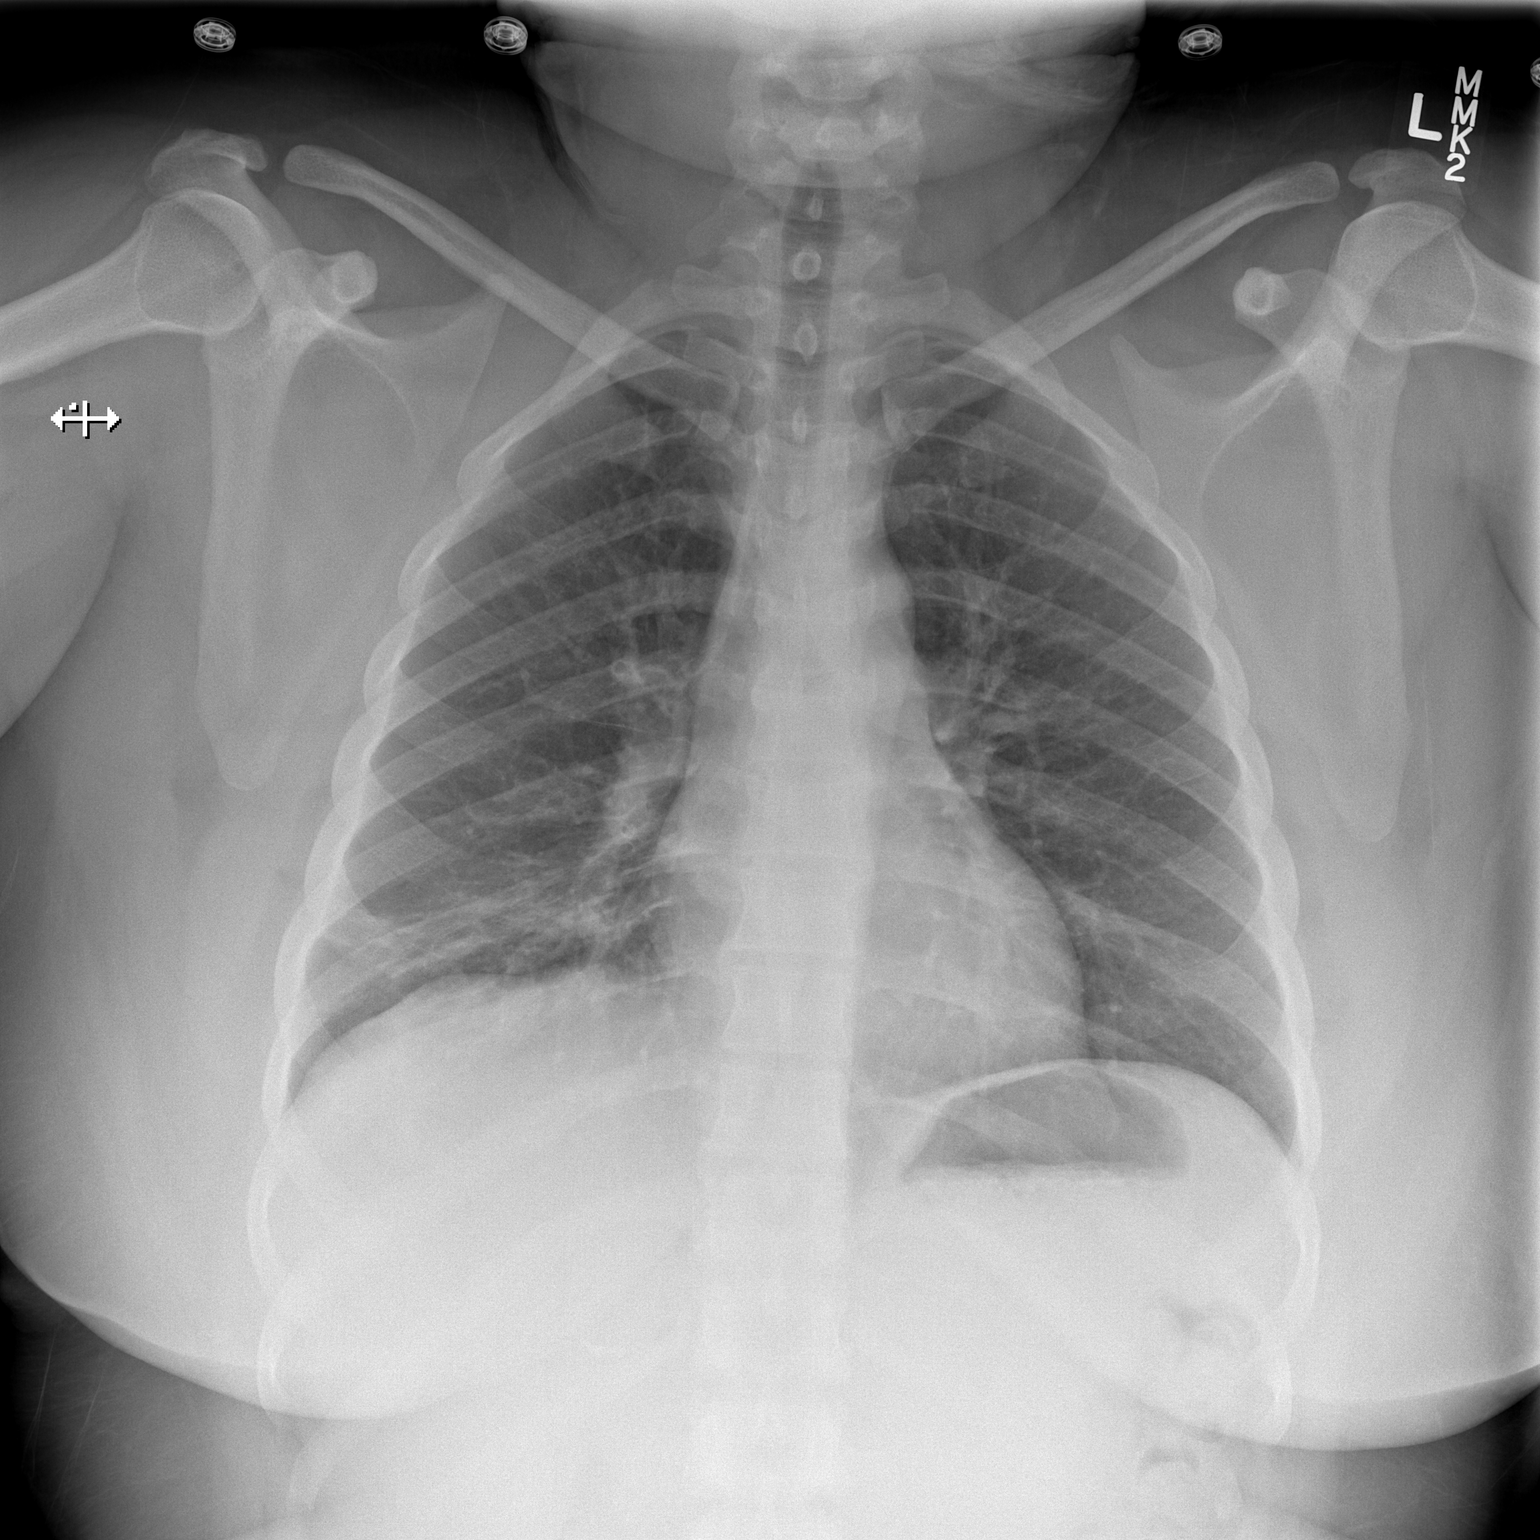

[w chest lat]
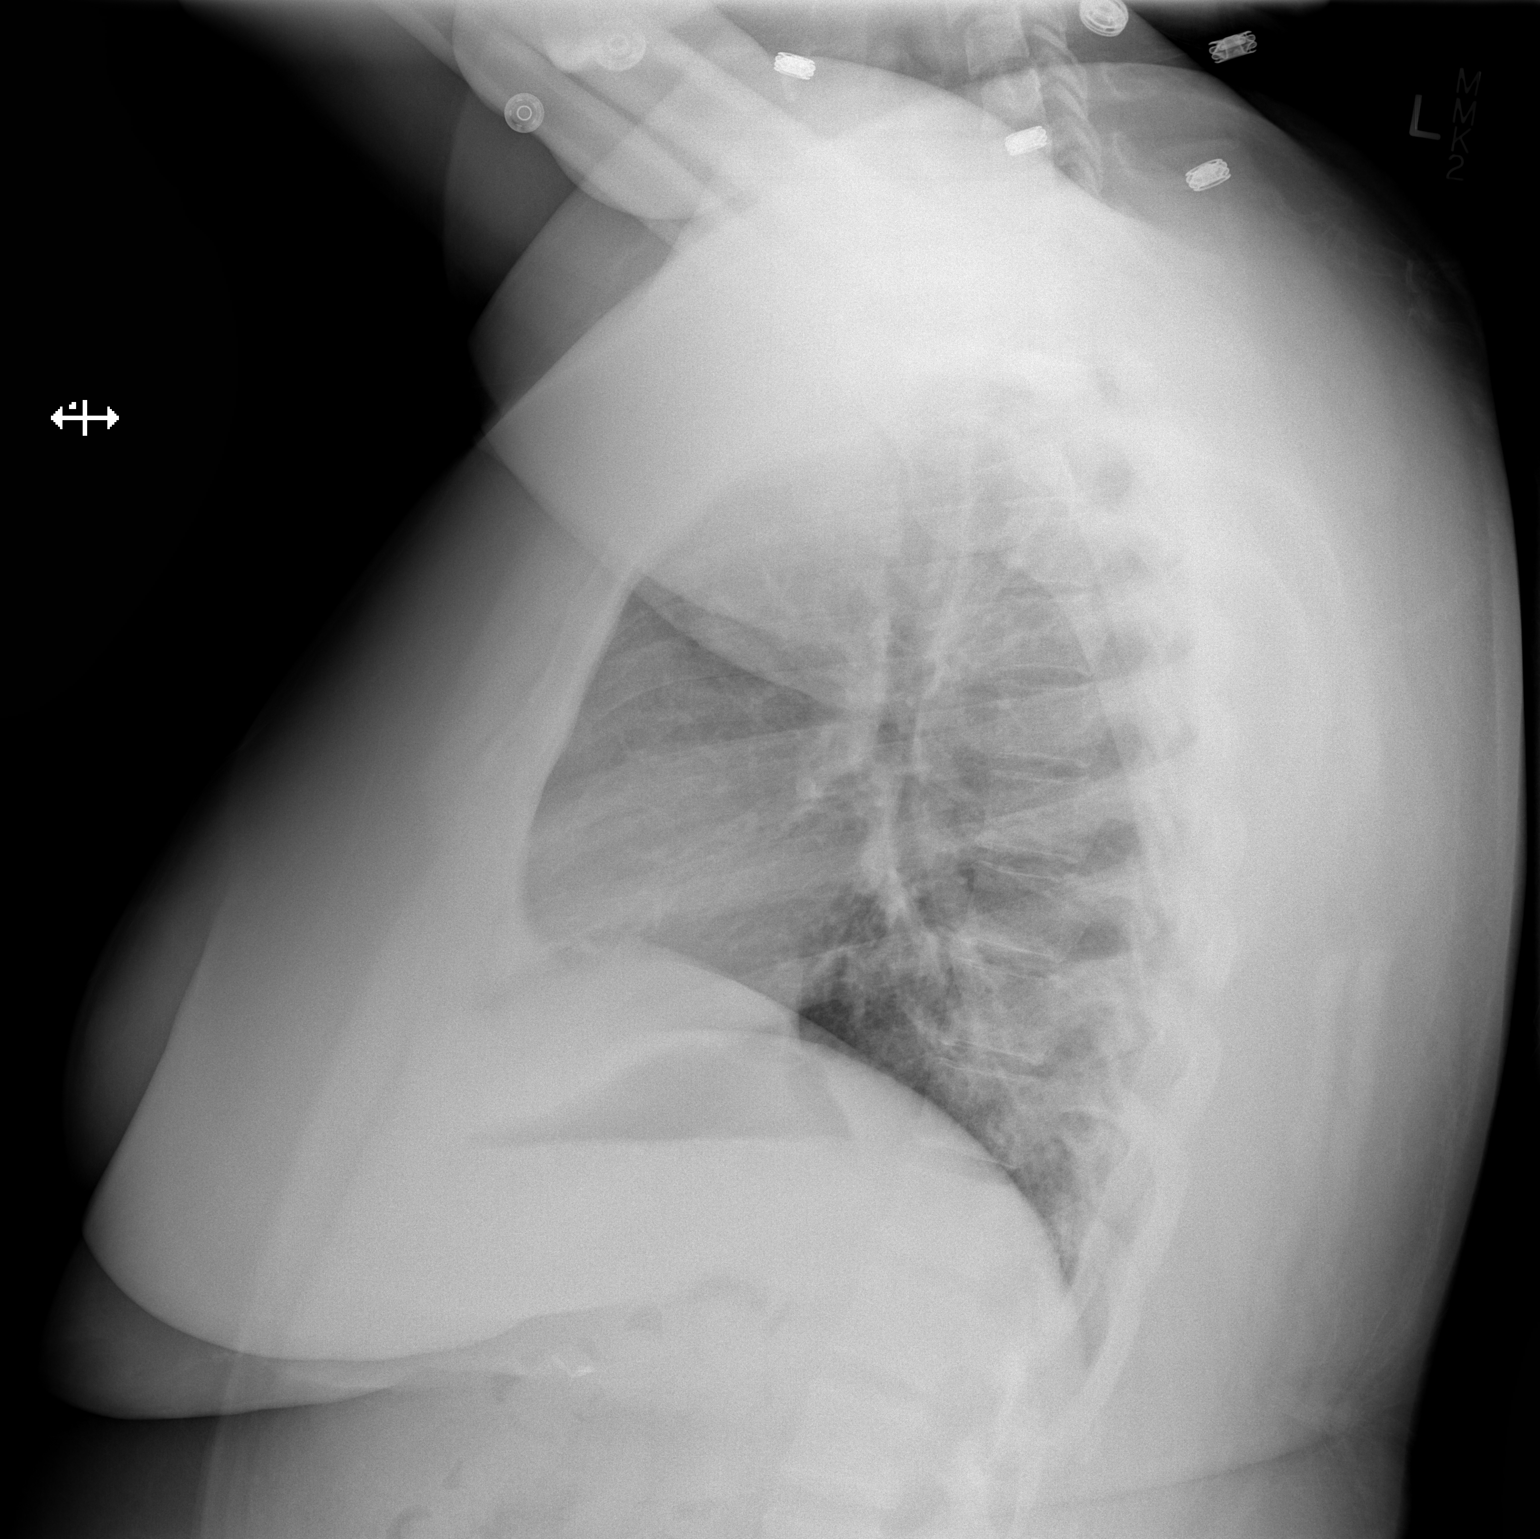

[2 of 2 positions shown; findings below may reference images not displayed]

FINDINGS: Ill-defined opacity in the right lower lobe concerning for
bronchopneumonia. Left lung is clear. No pleural effusions. No
evidence of pulmonary edema. No pneumothorax. No suspicious
appearing pulmonary nodules or masses. Heart size is normal. Upper
mediastinal contours are within normal limits.
IMPRESSION: 1. Findings in the right lower lobe concerning for bronchopneumonia
or sequela of recent aspiration. Followup PA and lateral chest X-ray
is recommended in 3-4 weeks following trial of antibiotic therapy to
ensure resolution and exclude underlying malignancy.

## 2015-11-17 MED ORDER — DOXYCYCLINE HYCLATE 100 MG PO TABS
100.0000 mg | ORAL_TABLET | Freq: Once | ORAL | Status: AC
  Administered 2015-11-17: 100 mg via ORAL
  Filled 2015-11-17: qty 1

## 2015-11-17 MED ORDER — ACETAMINOPHEN 325 MG PO TABS
650.0000 mg | ORAL_TABLET | Freq: Once | ORAL | Status: AC
  Administered 2015-11-17: 650 mg via ORAL
  Filled 2015-11-17: qty 2

## 2015-11-17 MED ORDER — SODIUM CHLORIDE 0.9 % IV BOLUS (SEPSIS)
1000.0000 mL | Freq: Once | INTRAVENOUS | Status: AC
Start: 2015-11-17 — End: 2015-11-17
  Administered 2015-11-17: 1000 mL via INTRAVENOUS

## 2015-11-17 MED ORDER — KETOROLAC TROMETHAMINE 30 MG/ML IJ SOLN
30.0000 mg | Freq: Once | INTRAMUSCULAR | Status: AC
  Administered 2015-11-17: 30 mg via INTRAVENOUS
  Filled 2015-11-17: qty 1

## 2015-11-17 MED ORDER — DOXYCYCLINE HYCLATE 100 MG PO CAPS: 100.0000 mg | ORAL_CAPSULE | Freq: Two times a day (BID) | ORAL | Status: DC

## 2015-11-17 NOTE — ED Notes (Signed)
Pt and family understood dc material. Scripts and paperwork reviewed at dc. NAD noted

## 2015-11-17 NOTE — ED Provider Notes (Signed)
CSN: [id number]     Arrival date & time 11/17/15  1826 History   First MD Initiated Contact with Patient 11/17/15 1952     Chief Complaint  Patient presents with  . Generalized Body Aches  . Cough     (Consider location/radiation/quality/duration/timing/severity/associated sxs/prior Treatment) HPI  35 year old female presents with cough and shortness of breath. She states that yesterday she started having bilateral ear pain and diffuse body aches. Has been having a cough as well. Is noted be short of breath but states she has no chest pain. Is having a headache as well as pain "everywhere". Has felt a subjective fever and chills. States she has had a history of bronchitis in the past, denies asthma, COPD, or smoking history. No vomiting, abdominal pain, or urinary symptoms. Feels diffusely week and fatigued.  Past Medical History  Diagnosis Date  . Hypertension   . Foot pain   . Obesity   . Anxiety    Past Surgical History  Procedure Laterality Date  . Cholecystectomy    . Breast reduction surgery     No family history on file. Social History  Substance Use Topics  . Smoking status: Never Smoker   . Smokeless tobacco: None  . Alcohol Use: Yes     Comment: 4 times a year   OB History    Gravida Para Term Preterm AB TAB SAB Ectopic Multiple Living   5 3 2 1 2     2      Review of Systems  Constitutional: Positive for fever and chills.  HENT: Positive for ear pain. Negative for congestion and sore throat.   Respiratory: Positive for cough and shortness of breath.   Cardiovascular: Negative for chest pain and leg swelling.  Gastrointestinal: Negative for vomiting and abdominal pain.  Genitourinary: Negative for dysuria.  Musculoskeletal: Positive for myalgias.  Neurological: Positive for weakness and headaches.  All other systems reviewed and are negative.     Allergies  Fish allergy; Lisinopril; Macrobid; Tramadol; Flagyl; and Vicodin  Home Medications   Prior to  Admission medications   Medication Sig Start Date End Date Taking? Authorizing Provider  albuterol (PROVENTIL HFA;VENTOLIN HFA) 108 (90 BASE) MCG/ACT inhaler Inhale 1-2 puffs into the lungs every 6 (six) hours as needed for wheezing or shortness of breath. 04/15/14  Yes Shari Upstill, PA-C  cetirizine (ZYRTEC) 10 MG tablet Take 10 mg by mouth daily as needed for allergies.   Yes Historical Provider, MD  fluvoxaMINE (LUVOX) 50 MG tablet Take 50 mg by mouth daily. 10/15/15  Yes Historical Provider, MD  levonorgestrel (MIRENA) 20 MCG/24HR IUD 1 each by Intrauterine route once. Implanted October 2014   Yes Historical Provider, MD  losartan-hydrochlorothiazide (HYZAAR) 100-12.5 MG tablet Take 1 tablet by mouth daily. 10/29/15  Yes Historical Provider, MD  phentermine (ADIPEX-P) 37.5 MG tablet Take 37.5 mg by mouth daily. 03/13/14  Yes Historical Provider, MD  hydrOXYzine (ATARAX/VISTARIL) 25 MG tablet Take 1 tablet (25 mg total) by mouth every 6 (six) hours as needed for anxiety, itching or nausea. Patient not taking: Reported on 11/17/2015 10/07/15   Fayrene HelperBowie Tran, PA-C  ibuprofen (ADVIL,MOTRIN) 600 MG tablet Take 1 tablet (600 mg total) by mouth every 6 (six) hours as needed. Patient not taking: Reported on 05/12/2015 04/10/15   Mady GemmaElizabeth C Westfall, PA-C  naproxen sodium (ALEVE) 220 MG tablet Take 2 tablets twice a day as needed for chest wall pain. Patient not taking: Reported on 08/02/2014 04/18/13   Paula LibraJohn Molpus, MD  BP 150/98 mmHg  Pulse 116  Temp(Src) 99.5 F (37.5 C) (Oral)  Resp 20  Ht  (1.575 m)  Wt 224 lb 2 oz (101.662 kg)  BMI 40.98 kg/m2  SpO2 92%  LMP 10/31/2015 Physical Exam  Constitutional: She is oriented to person, place, and time. She appears well-developed and well-nourished.  HENT:  Head: Normocephalic and atraumatic.  Right Ear: Tympanic membrane and external ear normal.  Left Ear: Tympanic membrane and external ear normal.  Nose: Nose normal.  Eyes: Right eye exhibits no  discharge. Left eye exhibits no discharge.  Cardiovascular: Regular rhythm and normal heart sounds.  Tachycardia present.   Pulmonary/Chest: Effort normal. She has rales in the right lower field.  Abdominal: Soft. She exhibits no distension. There is no tenderness.  Neurological: She is alert and oriented to person, place, and time.  Skin: Skin is warm and dry.  Nursing note and vitals reviewed.   ED Course  Procedures (including critical care time) Labs Review Labs Reviewed  CBC WITH DIFFERENTIAL/PLATELET - Abnormal; Notable for the following:    Monocytes Absolute 1.6 (*)    All other components within normal limits  BASIC METABOLIC PANEL - Abnormal; Notable for the following:    Potassium 3.2 (*)    Glucose, Bld 162 (*)    All other components within normal limits    Imaging Review Dg Chest 2 View  11/17/2015  CLINICAL DATA:  35 year old female with history of dry cough and body aches for 1 day. Bilateral ear pain. Low-grade fever. EXAM: CHEST  2 VIEW COMPARISON:  Chest x-ray 10/07/2015. FINDINGS: Ill-defined opacity in the right lower lobe concerning for bronchopneumonia. Left lung is clear. No pleural effusions. No evidence of pulmonary edema. No pneumothorax. No suspicious appearing pulmonary nodules or masses. Heart size is normal. Upper mediastinal contours are within normal limits. IMPRESSION: 1. Findings in the right lower lobe concerning for bronchopneumonia or sequela of recent aspiration. Followup PA and lateral chest X-ray is recommended in 3-4 weeks following trial of antibiotic therapy to ensure resolution and exclude underlying malignancy. Electronically Signed   By: Trudie Reed M.D.   On: 11/17/2015 20:38   I have personally reviewed and evaluated these images and lab results as part of my medical decision-making.   EKG Interpretation None      MDM   Final diagnoses:  Community acquired pneumonia    Patient overall appears well and appears improved after  fluids and Toradol. X-ray and exam are consistent with pneumonia. She does not have risk factors for more severe disease. Currently breathing well with normal oxygen saturation. Her tachycardia has improved and is now on the low 100s. Feels comfortable going home and she has no signs/symptoms concerning for sepsis at this time. Will discharge with oral antibiotics and strict return precautions. Instructed to follow-up closely with PCP.    Pricilla Loveless, MD 11/17/15 2303

## 2015-11-17 NOTE — ED Notes (Addendum)
Patient complains of dry cough and body aches x 1 day. Complains of bilateral ear pain with same. NAD. Low grade fever on arrival, no other associated symptoms

## 2016-08-24 ENCOUNTER — Emergency Department (HOSPITAL_COMMUNITY): Payer: Medicaid Other

## 2016-08-24 ENCOUNTER — Encounter (HOSPITAL_COMMUNITY): Payer: Self-pay

## 2016-08-24 ENCOUNTER — Emergency Department (HOSPITAL_COMMUNITY)
Admission: EM | Admit: 2016-08-24 | Discharge: 2016-08-24 | Disposition: A | Discharge status: Home / Self Care | Payer: Medicaid Other | Source: Home / Self Care | Attending: Emergency Medicine | Admitting: Emergency Medicine

## 2016-08-24 ENCOUNTER — Encounter (HOSPITAL_COMMUNITY): Payer: Self-pay | Admitting: Emergency Medicine

## 2016-08-24 ENCOUNTER — Emergency Department (HOSPITAL_COMMUNITY)
Admission: EM | Admit: 2016-08-24 | Discharge: 2016-08-24 | Disposition: A | Discharge status: Home / Self Care | Payer: Medicaid Other | Attending: Emergency Medicine | Admitting: Emergency Medicine

## 2016-08-24 DIAGNOSIS — R0602 Shortness of breath: Secondary | ICD-10-CM | POA: Diagnosis present

## 2016-08-24 DIAGNOSIS — R69 Illness, unspecified: Secondary | ICD-10-CM

## 2016-08-24 DIAGNOSIS — J111 Influenza due to unidentified influenza virus with other respiratory manifestations: Secondary | ICD-10-CM

## 2016-08-24 DIAGNOSIS — I10 Essential (primary) hypertension: Secondary | ICD-10-CM | POA: Insufficient documentation

## 2016-08-24 DIAGNOSIS — J40 Bronchitis, not specified as acute or chronic: Secondary | ICD-10-CM | POA: Insufficient documentation

## 2016-08-24 DIAGNOSIS — R6889 Other general symptoms and signs: Secondary | ICD-10-CM

## 2016-08-24 LAB — CBC
HCT: 39.3 % (ref 36.0–46.0)
HEMOGLOBIN: 13.3 g/dL (ref 12.0–15.0)
MCH: 27.3 pg (ref 26.0–34.0)
MCHC: 33.8 g/dL (ref 30.0–36.0)
MCV: 80.7 fL (ref 78.0–100.0)
Platelets: 305 10*3/uL (ref 150–400)
RBC: 4.87 MIL/uL (ref 3.87–5.11)
RDW: 13.8 % (ref 11.5–15.5)
WBC: 10.7 10*3/uL — ABNORMAL HIGH (ref 4.0–10.5)

## 2016-08-24 LAB — I-STAT CG4 LACTIC ACID, ED: Lactic Acid, Venous: 1.43 mmol/L (ref 0.5–1.9)

## 2016-08-24 LAB — BASIC METABOLIC PANEL
ANION GAP: 12 (ref 5–15)
BUN: 6 mg/dL (ref 6–20)
CHLORIDE: 99 mmol/L — AB (ref 101–111)
CO2: 25 mmol/L (ref 22–32)
CREATININE: 0.69 mg/dL (ref 0.44–1.00)
Calcium: 10 mg/dL (ref 8.9–10.3)
GFR calc non Af Amer: >60 mL/min (ref >60)
GLUCOSE: 107 mg/dL — AB (ref 65–99)
Potassium: 3.5 mmol/L (ref 3.5–5.1)
Sodium: 136 mmol/L (ref 135–145)

## 2016-08-24 LAB — I-STAT TROPONIN, ED: Troponin i, poc: 0 ng/mL (ref 0.00–0.08)

## 2016-08-24 IMAGING — DX DG CHEST 2V
2 series · 2 of 2 positions shown · non-contrast
Comparison: Chest radiograph 11/17/2015

CLINICAL DATA: Cough and shortness of breath

EXAM:
CHEST  2 VIEW

[chest pa]
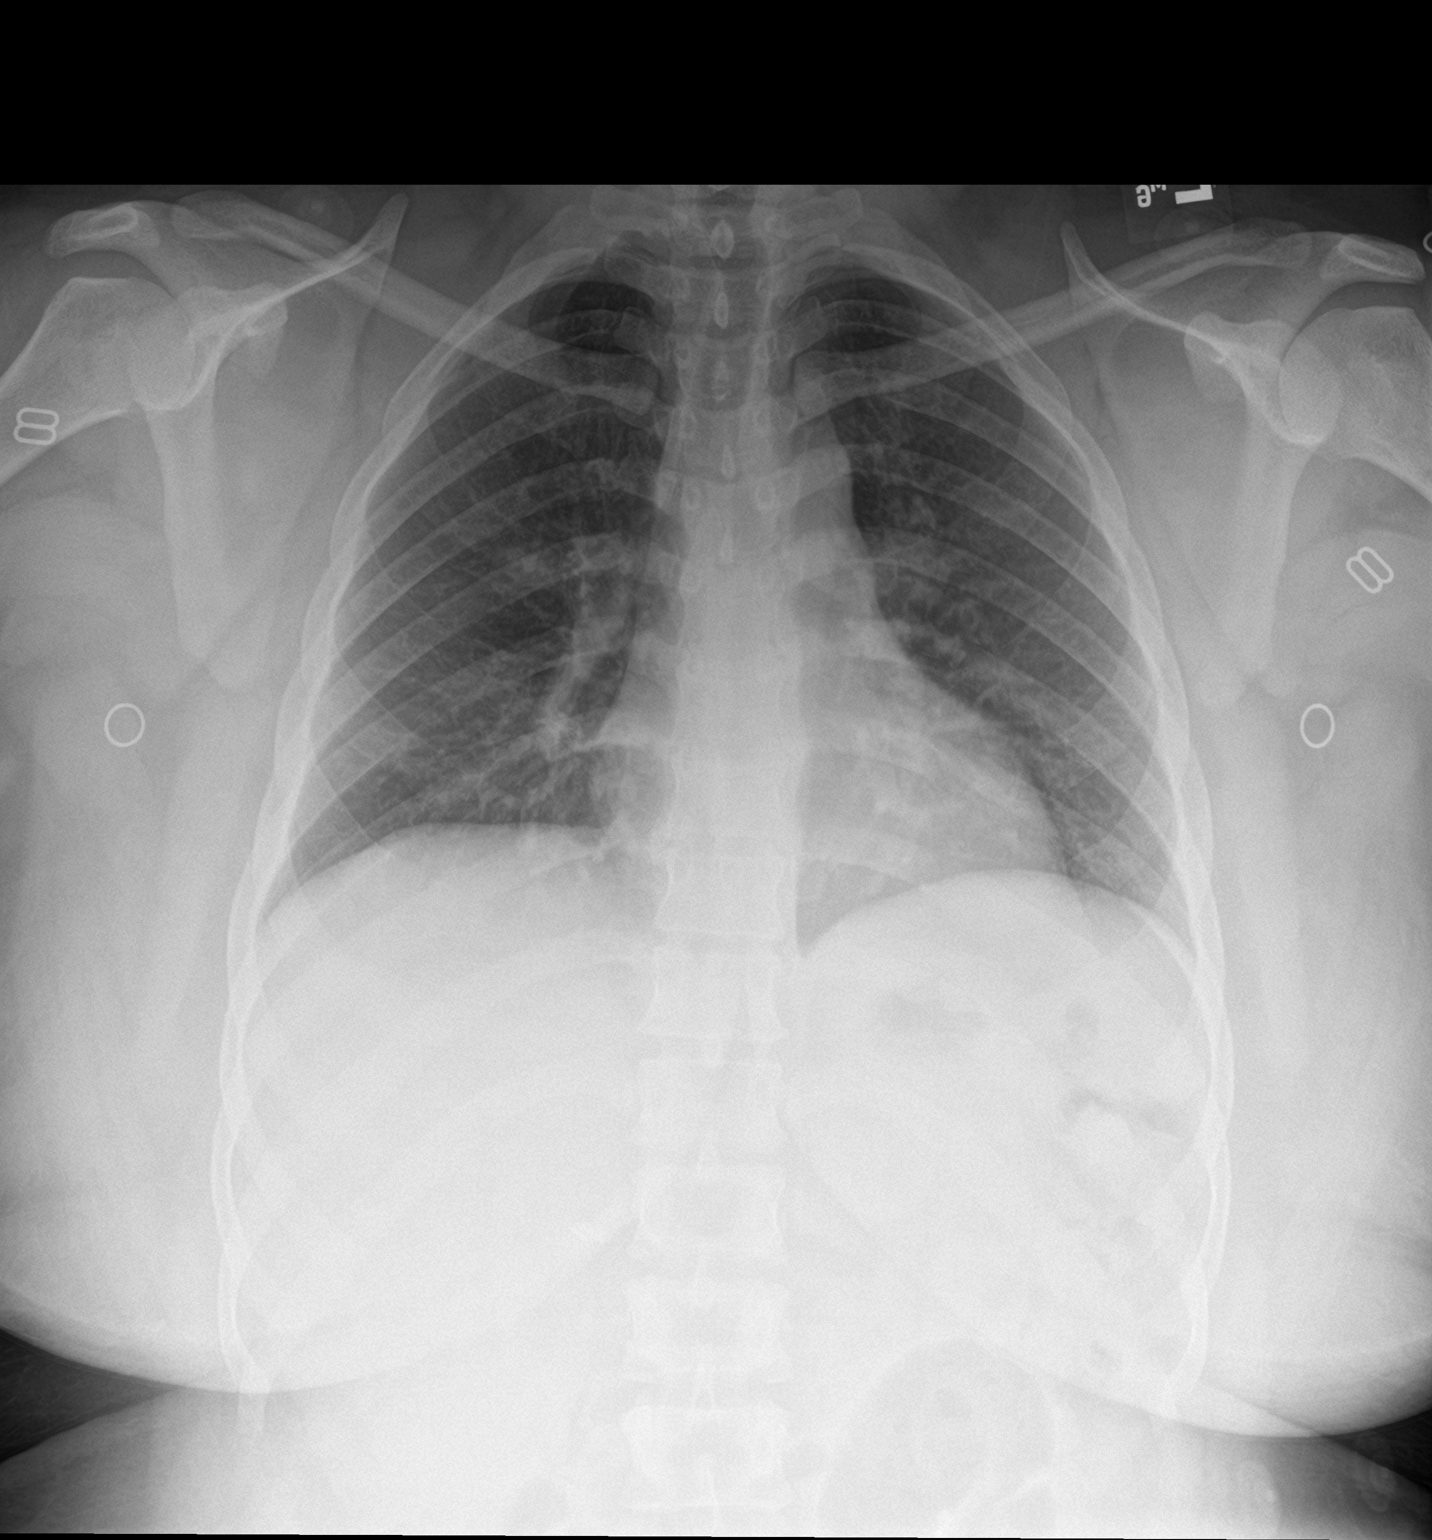

[chest lat]
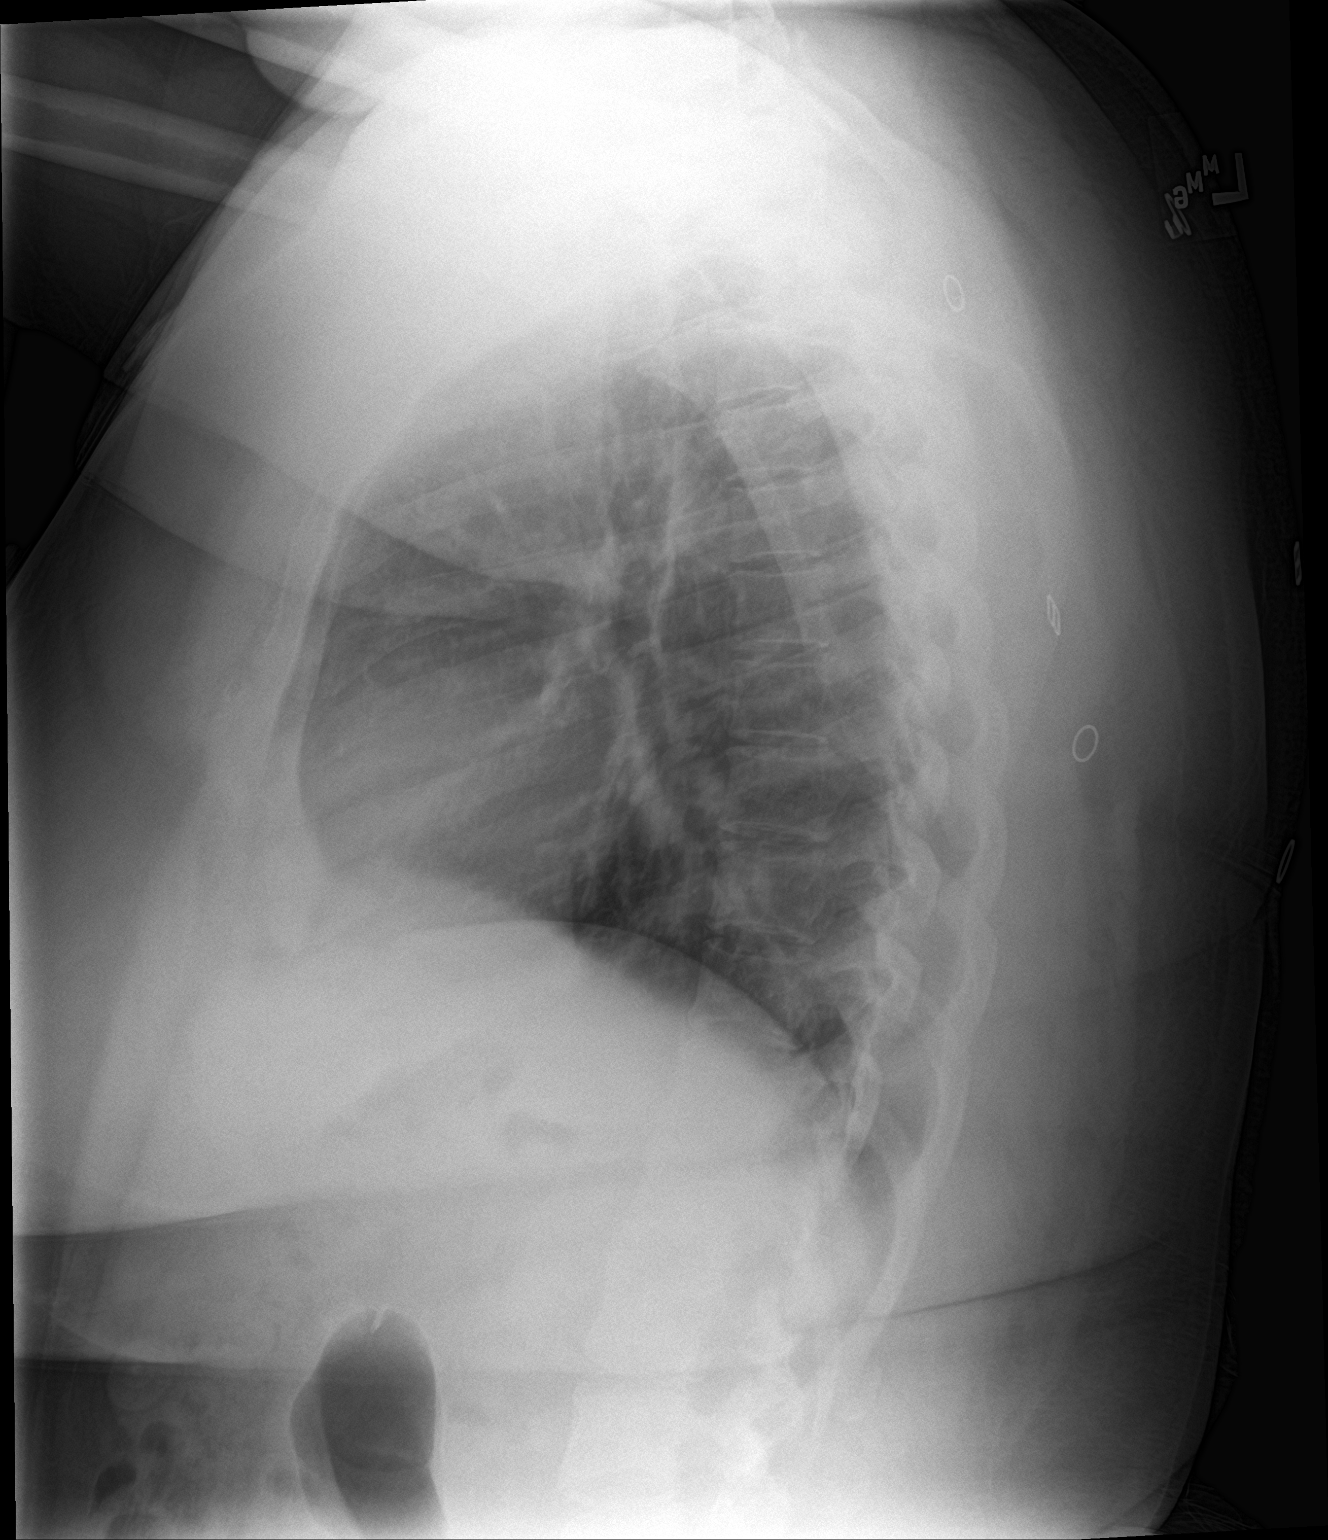

[2 of 2 positions shown; findings below may reference images not displayed]

FINDINGS: The heart size and mediastinal contours are within normal limits.
Both lungs are clear. Previously described right lung base opacities
have resolved. The visualized skeletal structures are unremarkable.
IMPRESSION: No active cardiopulmonary disease.

## 2016-08-24 MED ORDER — DOXYCYCLINE HYCLATE 100 MG PO CAPS
100.0000 mg | ORAL_CAPSULE | Freq: Two times a day (BID) | ORAL | 0 refills | Status: DC
Start: 2016-08-24 — End: 2019-08-10

## 2016-08-24 NOTE — ED Notes (Signed)
Patient sleeping, snoring, when not spoken to. No acute distress noted.

## 2016-08-24 NOTE — Discharge Instructions (Signed)
You may take over the counter medicine for symptomatic relief, such as Tylenol, Motrin, TheraFlu, Alka seltzer ,a black elderberry, etc. Please limit acetaminophen (Tylenol) to 4000 mg and Ibuprofen (Motrin, Advil, etc.) to 2400 mg for a 24hr period. Please note that other counter medicine may contain acetaminophen or ibuprofen as a component of the ingredients.  ° ° ° °

## 2016-08-24 NOTE — ED Triage Notes (Signed)
Pt complaining of cough and SOB. Pt states coughing up yellow sputum. Pt denies any chest pain. Pt states generalized malaise. Pt denies any urinary symptoms or abdominal pain.

## 2016-08-24 NOTE — Discharge Instructions (Addendum)
It was our pleasure to provide your ER care today - we hope that you feel better.  Rest. Drink plenty of fluids.  Take antibiotic as prescribed.  Take over the counter cough medication as need.   Follow up with primary care doctor in 1 week if symptoms fail to improve/resolve.  Return to ER if worse, trouble breathing, other concern.

## 2016-08-24 NOTE — ED Provider Notes (Signed)
[facility]-EMERGENCY DEPT Provider Note   CSN: [id number] Arrival date & time: 08/24/16  8413     History   Chief Complaint Chief Complaint  Patient presents with  . flu like symptoms    HPI Robin Davis is a 36 y.o. female.  The history is provided by the patient.  Influenza  Presenting symptoms: cough, fatigue, fever, myalgias, nausea, rhinorrhea, shortness of breath and sore throat   Onset quality:  Gradual Duration:  2 days Progression:  Worsening Chronicity:  New Relieved by:  Nothing Worsened by:  Nothing Associated symptoms: chills and nasal congestion   Risk factors: sick contacts (daughter)     Past Medical History:  Diagnosis Date  . Anxiety   . Foot pain   . Hypertension   . Obesity     There are no active problems to display for this patient.   Past Surgical History:  Procedure Laterality Date  . BREAST REDUCTION SURGERY    . CHOLECYSTECTOMY      OB History    Gravida Para Term Preterm AB Living   SAB TAB Ectopic Multiple Live Births                   Home Medications    Prior to Admission medications   Medication Sig Start Date End Date Taking? Authorizing Provider  albuterol (PROVENTIL HFA;VENTOLIN HFA) 108 (90 BASE) MCG/ACT inhaler Inhale 1-2 puffs into the lungs every 6 (six) hours as needed for wheezing or shortness of breath. 04/15/14   Elpidio Anis, PA-C  cetirizine (ZYRTEC) 10 MG tablet Take 10 mg by mouth daily as needed for allergies.    Historical Provider, MD  doxycycline (VIBRAMYCIN) 100 MG capsule Take 1 capsule (100 mg total) by mouth 2 (two) times daily. One po bid x 7 days 11/17/15   Pricilla Loveless, MD  fluvoxaMINE (LUVOX) 50 MG tablet Take 50 mg by mouth daily. 10/15/15   Historical Provider, MD  hydrOXYzine (ATARAX/VISTARIL) 25 MG tablet Take 1 tablet (25 mg total) by mouth every 6 (six) hours as needed for anxiety, itching or nausea. Patient not taking: Reported on 11/17/2015 10/07/15   Fayrene Helper, PA-C    ibuprofen (ADVIL,MOTRIN) 600 MG tablet Take 1 tablet (600 mg total) by mouth every 6 (six) hours as needed. Patient not taking: Reported on 05/12/2015 04/10/15   Mady Gemma, PA-C  levonorgestrel (MIRENA) 20 MCG/24HR IUD 1 each by Intrauterine route once. Implanted October 2014    Historical Provider, MD  losartan-hydrochlorothiazide (HYZAAR) 100-12.5 MG tablet Take 1 tablet by mouth daily. 10/29/15   Historical Provider, MD  naproxen sodium (ALEVE) 220 MG tablet Take 2 tablets twice a day as needed for chest wall pain. Patient not taking: Reported on 08/02/2014 04/18/13   Paula Libra, MD  phentermine (ADIPEX-P) 37.5 MG tablet Take 37.5 mg by mouth daily. 03/13/14   Historical Provider, MD    Family History Family History  Problem Relation Age of Onset  . Hypertension Other   . Diabetes Other     Social History Social History  Substance Use Topics  . Smoking status: Never Smoker  . Smokeless tobacco: Never Used  . Alcohol use Yes     Comment: seldom     Allergies   Fish allergy; Lisinopril; Macrobid [nitrofurantoin macrocrystal]; Tramadol; Flagyl [metronidazole hcl]; and Vicodin [hydrocodone-acetaminophen]   Review of Systems Review of Systems  Constitutional: Positive for chills, fatigue and fever.  HENT: Positive for congestion,  rhinorrhea and sore throat.   Respiratory: Positive for cough and shortness of breath.   Gastrointestinal: Positive for nausea.  Musculoskeletal: Positive for myalgias.  All other systems are reviewed and are negative for acute change except as noted in the HPI    Physical Exam Updated Vital Signs BP 127/86 (BP Location: Left Arm)   Pulse (!) 101   Temp 98.8 F (37.1 C) (Oral)   Resp 18   Wt 237 lb (107.5 kg)   LMP 08/06/2016 (Approximate)   SpO2 97%   BMI 43.35 kg/m   Physical Exam  Constitutional: She is oriented to person, place, and time. She appears well-developed and well-nourished. No distress.  HENT:  Head:  Normocephalic and atraumatic.  Nose: Nose normal.  Postnasal drip  Eyes: Conjunctivae and EOM are normal. Pupils are equal, round, and reactive to light. Right eye exhibits no discharge. Left eye exhibits no discharge. No scleral icterus.  Neck: Normal range of motion. Neck supple.  Cardiovascular: Normal rate and regular rhythm.  Exam reveals no gallop and no friction rub.   No murmur heard. Pulmonary/Chest: Effort normal and breath sounds normal. No stridor. No respiratory distress. She has no rales.  Abdominal: Soft. She exhibits no distension. There is no tenderness.  Musculoskeletal: She exhibits no edema or tenderness.  Neurological: She is alert and oriented to person, place, and time.  Skin: Skin is warm and dry. No rash noted. She is not diaphoretic. No erythema.  Psychiatric: She has a normal mood and affect.  Vitals reviewed.    ED Treatments / Results  Labs (all labs ordered are listed, but only abnormal results are displayed) Labs Reviewed - No data to display  EKG  EKG Interpretation None       Radiology No results found.  Procedures Procedures (including critical care time)  Medications Ordered in ED Medications - No data to display   Initial Impression / Assessment and Plan / ED Course  I have reviewed the triage vital signs and the nursing notes.  Pertinent labs & imaging results that were available during my care of the patient were reviewed by me and considered in my medical decision making (see chart for details).     36 y.o. female presents with flu-like symptoms for 2 days. adequate oral hydration. Rest of history as above.  Patient appears well. No signs of toxicity, patient is interactive. No hypoxia, tachypnea or other signs of respiratory distress. No sign of clinical dehydration. Lung exam clear. Rest of exam as above.  Most consistent with flu-like illness   No evidence suggestive of pharyngitis, AOM, PNA, or meningitis.  Chest x-ray  not indicated at this time.  Discussed symptomatic treatment with the patient and they will follow closely with their PCP.    Final Clinical Impressions(s) / ED Diagnoses   Final diagnoses:  Influenza-like illness   Disposition: Discharge  Condition: Good  I have discussed the results, Dx and Tx plan with the patient who expressed understanding and agree(s) with the plan. Discharge instructions discussed at great length. The patient was given strict return precautions who verbalized understanding of the instructions. No further questions at time of discharge.    New Prescriptions   No medications on file    Follow Up: Burtis Junes, MD  Schedule an appointment as soon as possible for a visit  in 5-7 days, If symptoms do not improve or  worsen      Nira Conn, MD 08/24/16 [phone]

## 2016-08-24 NOTE — ED Provider Notes (Signed)
[facility]-EMERGENCY DEPT Provider Note   CSN: [id number] Arrival date & time: 08/24/16  2114     History   Chief Complaint Chief Complaint  Patient presents with  . Cough  . Shortness of Breath    HPI Robin Davis is a 36 y.o. female.  The history is provided by the patient and medical records.  URI   This is a new problem. The current episode started more than 2 days ago. The problem has not changed since onset.The maximum temperature recorded prior to her arrival was 100 to 100.9 F. Associated symptoms include nausea, congestion, headaches, sore throat and cough. Pertinent negatives include no chest pain, no abdominal pain, no vomiting, no dysuria, no ear pain and no rash. Treatments tried: OTC meds. The treatment provided no relief.    Past Medical History:  Diagnosis Date  . Anxiety   . Foot pain   . Hypertension   . Obesity     There are no active problems to display for this patient.   Past Surgical History:  Procedure Laterality Date  . BREAST REDUCTION SURGERY    . CHOLECYSTECTOMY      OB History    Gravida Para Term Preterm AB Living   SAB TAB Ectopic Multiple Live Births                   Home Medications    Prior to Admission medications   Medication Sig Start Date End Date Taking? Authorizing Provider  albuterol (PROVENTIL HFA;VENTOLIN HFA) 108 (90 BASE) MCG/ACT inhaler Inhale 1-2 puffs into the lungs every 6 (six) hours as needed for wheezing or shortness of breath. 04/15/14   Elpidio Anis, PA-C  cetirizine (ZYRTEC) 10 MG tablet Take 10 mg by mouth daily as needed for allergies.    Historical Provider, MD  doxycycline (VIBRAMYCIN) 100 MG capsule Take 1 capsule (100 mg total) by mouth 2 (two) times daily. One po bid x 7 days 11/17/15   Pricilla Loveless, MD  doxycycline (VIBRAMYCIN) 100 MG capsule Take 1 capsule (100 mg total) by mouth 2 (two) times daily. 08/24/16   Cathren Laine, MD  fluvoxaMINE (LUVOX) 50 MG tablet Take 50 mg by mouth  daily. 10/15/15   Historical Provider, MD  hydrOXYzine (ATARAX/VISTARIL) 25 MG tablet Take 1 tablet (25 mg total) by mouth every 6 (six) hours as needed for anxiety, itching or nausea. Patient not taking: Reported on 11/17/2015 10/07/15   Fayrene Helper, PA-C  ibuprofen (ADVIL,MOTRIN) 600 MG tablet Take 1 tablet (600 mg total) by mouth every 6 (six) hours as needed. Patient not taking: Reported on 05/12/2015 04/10/15   Mady Gemma, PA-C  levonorgestrel (MIRENA) 20 MCG/24HR IUD 1 each by Intrauterine route once. Implanted October 2014    Historical Provider, MD  losartan-hydrochlorothiazide (HYZAAR) 100-12.5 MG tablet Take 1 tablet by mouth daily. 10/29/15   Historical Provider, MD  naproxen sodium (ALEVE) 220 MG tablet Take 2 tablets twice a day as needed for chest wall pain. Patient not taking: Reported on 08/02/2014 04/18/13   Paula Libra, MD  phentermine (ADIPEX-P) 37.5 MG tablet Take 37.5 mg by mouth daily. 03/13/14   Historical Provider, MD    Family History Family History  Problem Relation Age of Onset  . Hypertension Other   . Diabetes Other     Social History Social History  Substance Use Topics  . Smoking status: Never Smoker  . Smokeless tobacco: Never Used  . Alcohol  use Yes     Comment: seldom     Allergies   Fish allergy; Lisinopril; Macrobid [nitrofurantoin macrocrystal]; Tramadol; Flagyl [metronidazole hcl]; and Vicodin [hydrocodone-acetaminophen]   Review of Systems Review of Systems  Constitutional: Positive for fatigue and fever. Negative for chills.  HENT: Positive for congestion and sore throat. Negative for ear pain.   Eyes: Negative for pain and visual disturbance.  Respiratory: Positive for cough. Negative for shortness of breath.   Cardiovascular: Negative for chest pain and palpitations.  Gastrointestinal: Positive for nausea. Negative for abdominal pain and vomiting.  Genitourinary: Negative for dysuria and hematuria.  Musculoskeletal: Positive for  myalgias. Negative for arthralgias and back pain.  Skin: Negative for color change and rash.  Neurological: Positive for headaches. Negative for seizures and syncope.  All other systems reviewed and are negative.    Physical Exam Updated Vital Signs BP 105/70   Pulse (!) 102   Temp 99.2 F (37.3 C) (Oral)   Resp (!) 22   LMP 08/06/2016 (Approximate)   SpO2 95%   Physical Exam  Constitutional: She appears well-developed and well-nourished. No distress.  HENT:  Head: Normocephalic and atraumatic.  Mouth/Throat: Oropharynx is clear and moist. No oropharyngeal exudate.  Eyes: Conjunctivae are normal.  Neck: Neck supple.  Cardiovascular: Normal rate and regular rhythm.   No murmur heard. Pulmonary/Chest: Effort normal and breath sounds normal. No stridor. No respiratory distress.  Abdominal: Soft. There is no tenderness.  Musculoskeletal: She exhibits no edema.  Neurological: She is alert.  Skin: Skin is warm and dry.  Psychiatric: She has a normal mood and affect.  Nursing note and vitals reviewed.    ED Treatments / Results  Labs (all labs ordered are listed, but only abnormal results are displayed) Labs Reviewed  BASIC METABOLIC PANEL - Abnormal; Notable for the following:       Result Value   Chloride 99 (*)    Glucose, Bld 107 (*)    All other components within normal limits  CBC - Abnormal; Notable for the following:    WBC 10.7 (*)    All other components within normal limits  I-STAT TROPOININ, ED  I-STAT CG4 LACTIC ACID, ED    EKG  EKG Interpretation  Date/Time:  Sunday August 24 2016 21:20:13 EDT Ventricular Rate:  112 PR Interval:  146 QRS Duration: 92 QT Interval:  326 QTC Calculation: 444 R Axis:   59 Text Interpretation:  Sinus tachycardia Nonspecific T wave abnormality Confirmed by Denton Lank  MD, Caryn Bee ([id number]) on 08/24/2016 19:14:78 PM       Radiology Dg Chest 2 View  Result Date: 08/24/2016 CLINICAL DATA:  Cough and shortness of breath EXAM:  CHEST  2 VIEW COMPARISON:  Chest radiograph 11/17/2015 FINDINGS: The heart size and mediastinal contours are within normal limits. Both lungs are clear. Previously described right lung base opacities have resolved. The visualized skeletal structures are unremarkable. IMPRESSION: No active cardiopulmonary disease. Electronically Signed   By: Deatra Robinson M.D.   On: 08/24/2016 22:37    Procedures Procedures (including critical care time)  Medications Ordered in ED Medications - No data to display   Initial Impression / Assessment and Plan / ED Course  I have reviewed the triage vital signs and the nursing notes.  Pertinent labs & imaging results that were available during my care of the patient were reviewed by me and considered in my medical decision making (see chart for details).    Pt presents with flu-like symptoms for the  last 3days. Was seen at [facility] yesterday, and dx'd w/a flu-like illness, but had no testing perform; she says she's had the flu & PNA at the same time twice before, so she came here to get a CXR and make sure she didn't need antibiotics.  VS & exam as above. CXR w/o evidence of cardiopulmonary disease. Labs unremarkable. Agree that the Pt is suffering from a flu-like illness; not high risk for complications & 3days into illness, so no indication for Tamiflu.  Explained all results to the Pt. Will discharge the Pt home w/prescription for doxycycline for bronchitis. Recommending follow-up with PCP. ED return precautions provided. Pt acknowledged understanding of, and concurrence with the plan. All questions answered to her satisfaction. In stable condition at the time of discharge.  Final Clinical Impressions(s) / ED Diagnoses   Final diagnoses:  Flu-like symptoms  Bronchitis    New Prescriptions Discharge Medication List as of 08/24/2016 11:34 PM    START taking these medications   Details  !! doxycycline (VIBRAMYCIN) 100 MG capsule Take 1 capsule (100 mg total) by  mouth 2 (two) times daily., Starting Sun 08/24/2016, Print     !! - Potential duplicate medications found. Please discuss with provider.       Forest Becker, MD 08/25/16 0122    Cathren Laine, MD 08/27/16 [phone]

## 2016-08-24 NOTE — ED Notes (Signed)
ED Provider at bedside. 

## 2016-11-03 ENCOUNTER — Ambulatory Visit: Payer: Medicaid Other | Admitting: Registered"

## 2017-02-11 ENCOUNTER — Other Ambulatory Visit (HOSPITAL_BASED_OUTPATIENT_CLINIC_OR_DEPARTMENT_OTHER): Payer: Self-pay

## 2017-02-11 DIAGNOSIS — R5383 Other fatigue: Secondary | ICD-10-CM

## 2017-02-11 DIAGNOSIS — G4710 Hypersomnia, unspecified: Secondary | ICD-10-CM

## 2017-02-17 ENCOUNTER — Ambulatory Visit (HOSPITAL_BASED_OUTPATIENT_CLINIC_OR_DEPARTMENT_OTHER): Payer: Medicaid Other | Attending: Specialist | Admitting: Internal Medicine

## 2017-02-17 VITALS — Ht 62.0 in | Wt 230.0 lb

## 2017-02-17 DIAGNOSIS — E669 Obesity, unspecified: Secondary | ICD-10-CM | POA: Diagnosis not present

## 2017-02-17 DIAGNOSIS — R5383 Other fatigue: Secondary | ICD-10-CM

## 2017-02-17 DIAGNOSIS — G4763 Sleep related bruxism: Secondary | ICD-10-CM | POA: Diagnosis not present

## 2017-02-17 DIAGNOSIS — G4733 Obstructive sleep apnea (adult) (pediatric): Secondary | ICD-10-CM | POA: Diagnosis not present

## 2017-02-17 DIAGNOSIS — G4710 Hypersomnia, unspecified: Secondary | ICD-10-CM | POA: Diagnosis present

## 2017-02-17 DIAGNOSIS — Z6841 Body Mass Index (BMI) 40.0 and over, adult: Secondary | ICD-10-CM | POA: Insufficient documentation

## 2017-02-17 DIAGNOSIS — I10 Essential (primary) hypertension: Secondary | ICD-10-CM | POA: Insufficient documentation

## 2017-02-22 DIAGNOSIS — G4710 Hypersomnia, unspecified: Secondary | ICD-10-CM

## 2017-02-22 NOTE — Procedures (Signed)
   Patient Name: Robin Davis, Robin Davis Date: 02/17/2017 Gender: Female D.O.B: [birth year] Age (years): 35 Referring Provider: Nicolasa Ducking Height (inches): 62 Interpreting Physician: Jetty Duhamel MD, ABSM Weight (lbs): 230 RPSGT: Shelah Lewandowsky BMI: 42 MRN: [id number] Neck Size: 16.00 CLINICAL INFORMATION Sleep Study Type: NPSG  Indication for sleep study: Excessive Daytime Sleepiness, Fatigue, Hypertension, Obesity, Snoring, Witnessed Apneas  Epworth Sleepiness Score: 8/24  SLEEP STUDY TECHNIQUE As per the AASM Manual for the Scoring of Sleep and Associated Events v2.3 (April 2016) with a hypopnea requiring 4% desaturations.  The channels recorded and monitored were frontal, central and occipital EEG, electrooculogram (EOG), submentalis EMG (chin), nasal and oral airflow, thoracic and abdominal wall motion, anterior tibialis EMG, snore microphone, electrocardiogram, and pulse oximetry.  MEDICATIONS Medications self-administered by patient taken the night of the study : none reported  SLEEP ARCHITECTURE The study was initiated at 9:48:26 PM and ended at 4:36:32 AM.  Sleep onset time was 96.8 minutes and the sleep efficiency was 59.5%. The total sleep time was 242.8 minutes.  Stage REM latency was N/A minutes.  The patient spent 10.30% of the night in stage N1 sleep, 88.26% in stage N2 sleep, 1.44% in stage N3 and 0.00% in REM.  Alpha intrusion was absent.  Supine sleep was 6.81%.  RESPIRATORY PARAMETERS The overall apnea/hypopnea index (AHI) was 25.2 per hour. There were 2 total apneas, including 2 obstructive, 0 central and 0 mixed apneas. There were 100 hypopneas and 35 RERAs.  The AHI during Stage REM sleep was N/A per hour.  AHI while supine was 47.1 per hour.  The mean oxygen saturation was 94.21%. The minimum SpO2 during sleep was 85.00%.  loud snoring was noted during this study.  CARDIAC DATA The 2 lead EKG demonstrated sinus rhythm. The mean  heart rate was 93.21 beats per minute. Other EKG findings include: PVCs.  LEG MOVEMENT DATA The total PLMS were 0 with a resulting PLMS index of 0.00. Associated arousal with leg movement index was 0.0 .  IMPRESSIONS - Moderate obstructive sleep apnea occurred during this study (AHI = 25.2/h). - Insufficient sleep and early events to meet protocol requirements for split CPAP titration. - No significant central sleep apnea occurred during this study (CAI = 0.0/h). - Mild oxygen desaturation was noted during this study (Min O2 = 85.00%). - The patient snored with loud snoring volume. - EKG findings include PVCs. - Clinically significant periodic limb movements did not occur during sleep. No significant associated arousals. 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 - Positional therapy avoiding supine position during sleep. - Consider oral bite guard for bruxism - Avoid alcohol, sedatives and other CNS depressants that may worsen sleep apnea and disrupt normal sleep architecture. - Sleep hygiene should be reviewed to assess factors that may improve sleep quality. - Weight management and regular exercise should be initiated or continued if appropriate.  [Electronically signed] 02/22/2017 02:55 PM  Jetty Duhamel MD, ABSM Diplomate, American Board of Sleep Medicine   NPI: [id number]  Waymon Budge Diplomate, American Board of Sleep Medicine  ELECTRONICALLY SIGNED ON:  02/22/2017, 2:51 PM [facility] PH: [phone]   FX: [phone] ACCREDITED BY THE AMERICAN ACADEMY OF SLEEP MEDICINE

## 2017-04-06 ENCOUNTER — Ambulatory Visit (HOSPITAL_COMMUNITY)
Admission: EM | Admit: 2017-04-06 | Discharge: 2017-04-06 | Disposition: A | Discharge status: Home / Self Care | Payer: Medicaid Other | Attending: Urgent Care | Admitting: Urgent Care

## 2017-04-06 ENCOUNTER — Other Ambulatory Visit: Payer: Self-pay

## 2017-04-06 ENCOUNTER — Encounter (HOSPITAL_COMMUNITY): Payer: Self-pay | Admitting: Emergency Medicine

## 2017-04-06 DIAGNOSIS — J029 Acute pharyngitis, unspecified: Secondary | ICD-10-CM | POA: Diagnosis not present

## 2017-04-06 DIAGNOSIS — R05 Cough: Secondary | ICD-10-CM | POA: Diagnosis not present

## 2017-04-06 DIAGNOSIS — Z79899 Other long term (current) drug therapy: Secondary | ICD-10-CM | POA: Insufficient documentation

## 2017-04-06 DIAGNOSIS — R059 Cough, unspecified: Secondary | ICD-10-CM

## 2017-04-06 LAB — POCT RAPID STREP A: Streptococcus, Group A Screen (Direct): NEGATIVE

## 2017-04-06 MED ORDER — CETIRIZINE HCL 10 MG PO TABS: 10.0000 mg | ORAL_TABLET | Freq: Every day | ORAL | 0 refills | Status: DC

## 2017-04-06 MED ORDER — PSEUDOEPHEDRINE HCL ER 120 MG PO TB12: 120.0000 mg | ORAL_TABLET | Freq: Two times a day (BID) | ORAL | 3 refills | Status: DC

## 2017-04-06 MED ORDER — BENZONATATE 100 MG PO CAPS: 100.0000 mg | ORAL_CAPSULE | Freq: Three times a day (TID) | ORAL | 0 refills | Status: DC | PRN

## 2017-04-06 NOTE — ED Provider Notes (Signed)
MRN: [id number] DOB: May 18, 1980  Subjective:   Robin Davis is a 36 y.o. female presenting for onset of sore throat, scratchy throat today. Has also had productive cough. Has not tried any medications for relief. Denies fever, chest pain, shob, wheezing, sinus pain, sinus congestion. Denies history of asthma, allergies. Denies smoking cigarettes.   No current facility-administered medications for this encounter.    Current Outpatient Medications  Medication Sig Dispense Refill  . meloxicam (MOBIC) 7.5 MG tablet Take 7.5 mg by mouth daily.    Marland Kitchen. albuterol (PROVENTIL HFA;VENTOLIN HFA) 108 (90 BASE) MCG/ACT inhaler Inhale 1-2 puffs into the lungs every 6 (six) hours as needed for wheezing or shortness of breath. 1 Inhaler 0  . cetirizine (ZYRTEC) 10 MG tablet Take 10 mg by mouth daily as needed for allergies.    Marland Kitchen. doxycycline (VIBRAMYCIN) 100 MG capsule Take 1 capsule (100 mg total) by mouth 2 (two) times daily. One po bid x 7 days 14 capsule 0  . doxycycline (VIBRAMYCIN) 100 MG capsule Take 1 capsule (100 mg total) by mouth 2 (two) times daily. 14 capsule 0  . fluvoxaMINE (LUVOX) 50 MG tablet Take 50 mg by mouth daily.  0  . hydrOXYzine (ATARAX/VISTARIL) 25 MG tablet Take 1 tablet (25 mg total) by mouth every 6 (six) hours as needed for anxiety, itching or nausea. (Patient not taking: Reported on 11/17/2015) 12 tablet 0  . ibuprofen (ADVIL,MOTRIN) 600 MG tablet Take 1 tablet (600 mg total) by mouth every 6 (six) hours as needed. (Patient not taking: Reported on 05/12/2015) 30 tablet 0  . levonorgestrel (MIRENA) 20 MCG/24HR IUD 1 each by Intrauterine route once. Implanted October 2014    . losartan-hydrochlorothiazide (HYZAAR) 100-12.5 MG tablet Take 1 tablet by mouth daily.  5  . naproxen sodium (ALEVE) 220 MG tablet Take 2 tablets twice a day as needed for chest wall pain. (Patient not taking: Reported on 08/02/2014)    . phentermine (ADIPEX-P) 37.5 MG tablet Take 37.5 mg by mouth daily.  3      Robin Davis is allergic to fish allergy; lisinopril; macrobid [nitrofurantoin macrocrystal]; tramadol; flagyl [metronidazole hcl]; and vicodin [hydrocodone-acetaminophen].  Robin Davis  has a past medical history of Anxiety, Foot pain, Hypertension, and Obesity. Also  has a past surgical history that includes Cholecystectomy and Breast reduction surgery.  Objective:   Vitals: BP (!) 146/88 (BP Location: Left Arm) Comment (BP Location): large cuff  Pulse 98   Temp 98.5 F (36.9 C) (Oral)   Resp (!) 22   LMP 04/05/2017   SpO2 99%   Physical Exam  Constitutional: She is oriented to person, place, and time. She appears well-developed and well-nourished.  HENT:  TM's intact bilaterally, no effusions or erythema. Nasal turbinates erythematous and dry, nasal passages patent. Mild post-nasal drainage noted, mucous membranes moist.  Eyes: Right eye exhibits no discharge. Left eye exhibits no discharge.  Neck: Normal range of motion. Neck supple.  Cardiovascular: Normal rate, regular rhythm and intact distal pulses. Exam reveals no gallop and no friction rub.  No murmur heard. Pulmonary/Chest: No respiratory distress. She has no wheezes. She has no rales.  Lymphadenopathy:    She has no cervical adenopathy.  Neurological: She is alert and oriented to person, place, and time.  Skin: Skin is warm and dry.  Psychiatric: She has a normal mood and affect.   Results for orders placed or performed during the hospital encounter of 04/06/17 (from the past 24 hour(s))  POCT rapid strep A ([facility])     Status: None   Collection Time: 04/06/17  6:41 PM  Result Value Ref Range   Streptococcus, Group A Screen (Direct) NEGATIVE NEGATIVE   Assessment and Plan :   Sore throat  Cough  Patient requested an antibiotic and steroid today. I explained that her illness is likely viral in nature, advised supportive care. Counseled on appropriate use of antibiotics and steroids. If no improvement or symptoms  do not resolve return to clinic in 1 week.   Wallis BambergMario Brinklee Cisse, PA-C [facility]  04/06/2017  6:30 PM    Wallis BambergMani, Keivon Garden, PA-C 04/06/17 1907

## 2017-04-06 NOTE — Discharge Instructions (Signed)
For sore throat try using a honey-based tea. Use 3 teaspoons of honey with juice squeezed from half lemon. Place shaved pieces of ginger into 1/2-1 cup of water and warm over stove top. Then mix the ingredients and repeat every 4 hours as needed. Make sure you hydrate with at least 64 ounces of water daily.

## 2017-04-06 NOTE — ED Triage Notes (Signed)
This morning started feeling ill.  Had pharyngitis a month ago.  Throat is dry, sore , coughing.  Denies sinus drainage .

## 2017-04-07 ENCOUNTER — Encounter (HOSPITAL_COMMUNITY): Payer: Self-pay | Admitting: Emergency Medicine

## 2017-04-07 ENCOUNTER — Emergency Department (HOSPITAL_COMMUNITY): Payer: Medicaid Other

## 2017-04-07 ENCOUNTER — Other Ambulatory Visit: Payer: Self-pay

## 2017-04-07 DIAGNOSIS — Z79899 Other long term (current) drug therapy: Secondary | ICD-10-CM | POA: Insufficient documentation

## 2017-04-07 DIAGNOSIS — R05 Cough: Secondary | ICD-10-CM | POA: Diagnosis present

## 2017-04-07 DIAGNOSIS — J0100 Acute maxillary sinusitis, unspecified: Secondary | ICD-10-CM | POA: Insufficient documentation

## 2017-04-07 DIAGNOSIS — I10 Essential (primary) hypertension: Secondary | ICD-10-CM | POA: Insufficient documentation

## 2017-04-07 LAB — BASIC METABOLIC PANEL
Anion gap: 6 (ref 5–15)
BUN: 7 mg/dL (ref 6–20)
CALCIUM: 9.4 mg/dL (ref 8.9–10.3)
CO2: 27 mmol/L (ref 22–32)
CREATININE: 0.73 mg/dL (ref 0.44–1.00)
Chloride: 103 mmol/L (ref 101–111)
GFR calc Af Amer: >60 mL/min (ref >60)
GLUCOSE: 115 mg/dL — AB (ref 65–99)
POTASSIUM: 3.4 mmol/L — AB (ref 3.5–5.1)
SODIUM: 136 mmol/L (ref 135–145)

## 2017-04-07 LAB — CBC
HEMATOCRIT: 40.5 % (ref 36.0–46.0)
Hemoglobin: 13.5 g/dL (ref 12.0–15.0)
MCH: 27.5 pg (ref 26.0–34.0)
MCHC: 33.3 g/dL (ref 30.0–36.0)
MCV: 82.5 fL (ref 78.0–100.0)
PLATELETS: 270 10*3/uL (ref 150–400)
RBC: 4.91 MIL/uL (ref 3.87–5.11)
RDW: 14.1 % (ref 11.5–15.5)
WBC: 11.4 10*3/uL — AB (ref 4.0–10.5)

## 2017-04-07 LAB — I-STAT TROPONIN, ED: Troponin i, poc: 0 ng/mL (ref 0.00–0.08)

## 2017-04-07 LAB — I-STAT BETA HCG BLOOD, ED (MC, WL, AP ONLY)

## 2017-04-07 IMAGING — CR DG CHEST 2V
2 series · 2 of 2 positions shown · non-contrast
Comparison: 08/24/2016

CLINICAL DATA: Chest pain, shortness of breath and cough.

EXAM:
CHEST  2 VIEW

[chest pa]
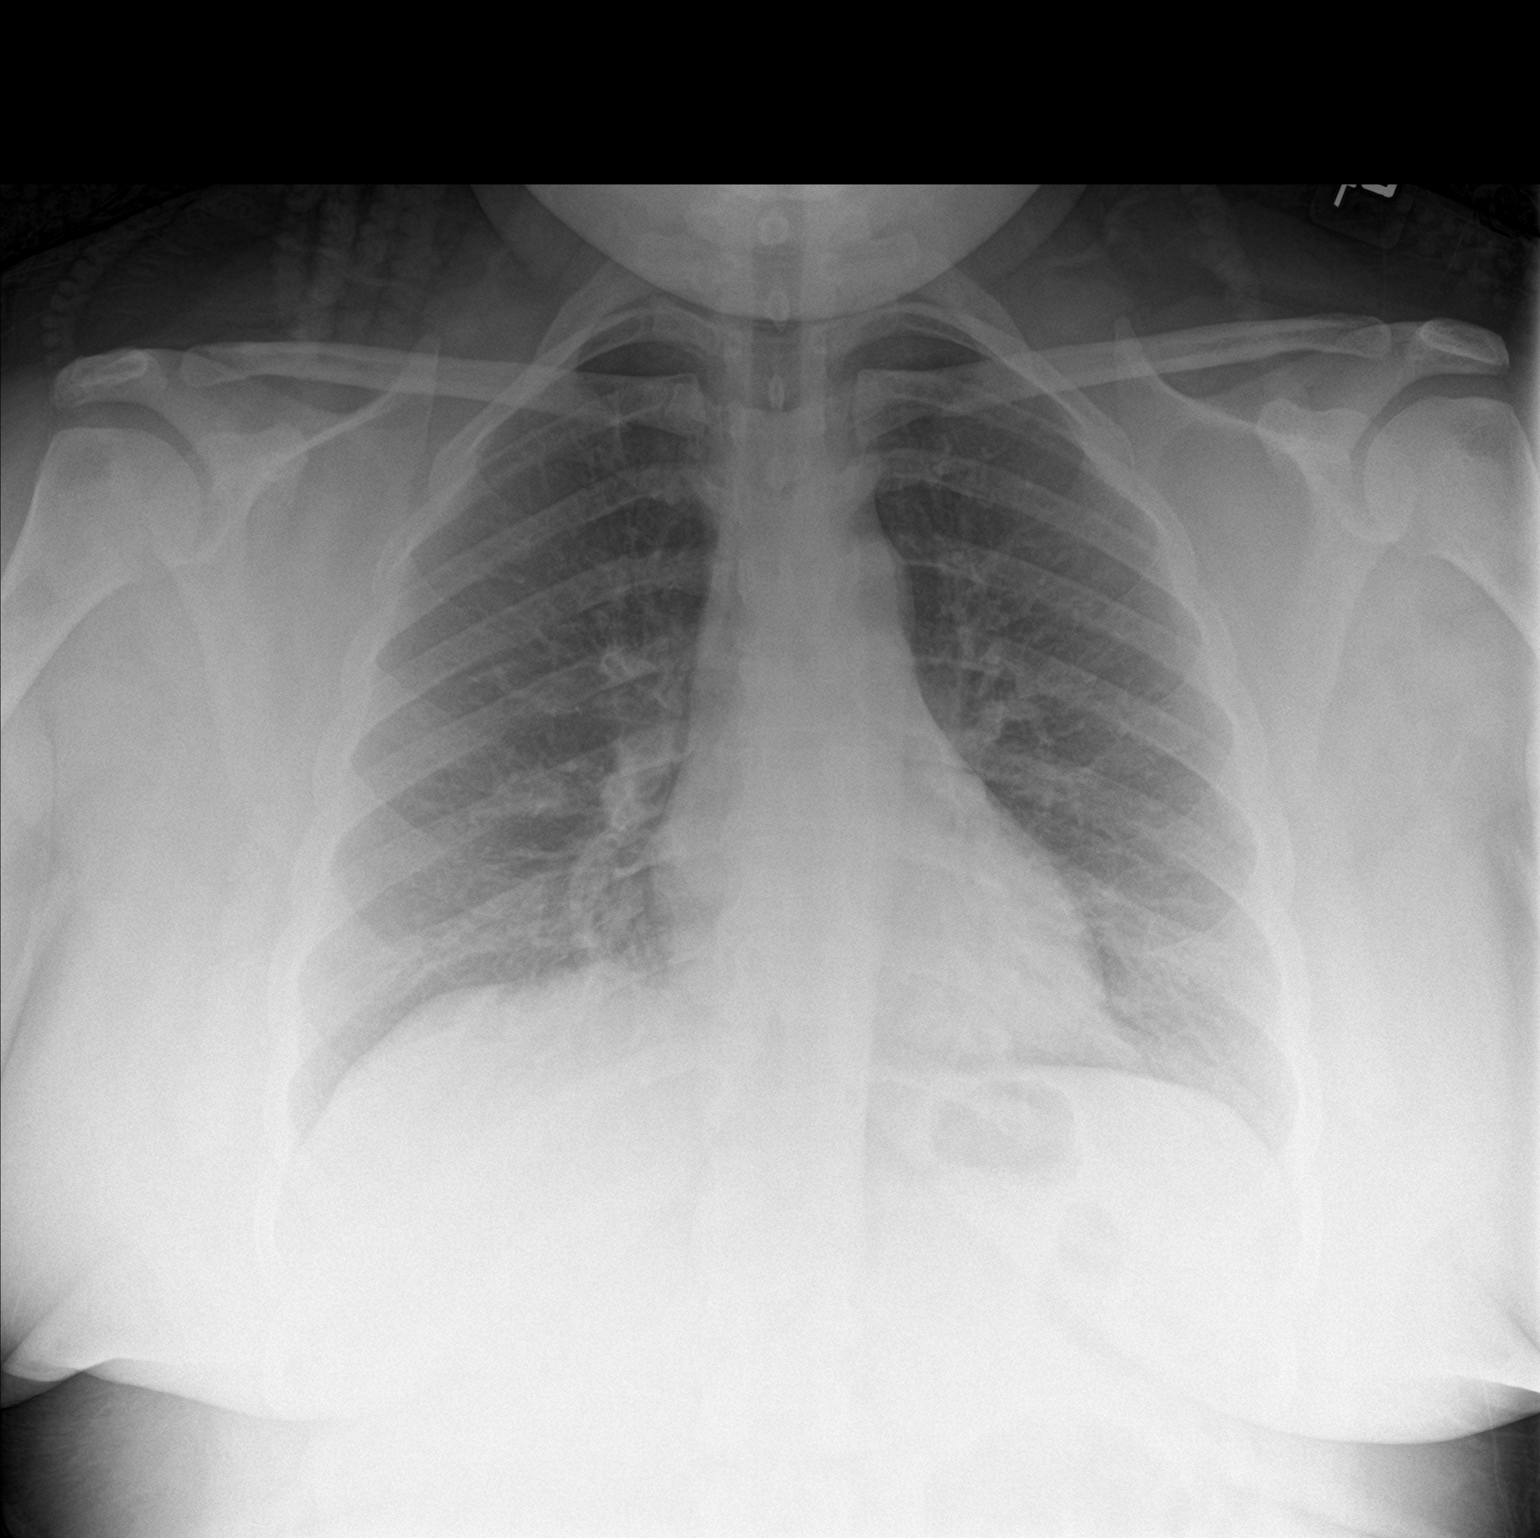

[chest lat]
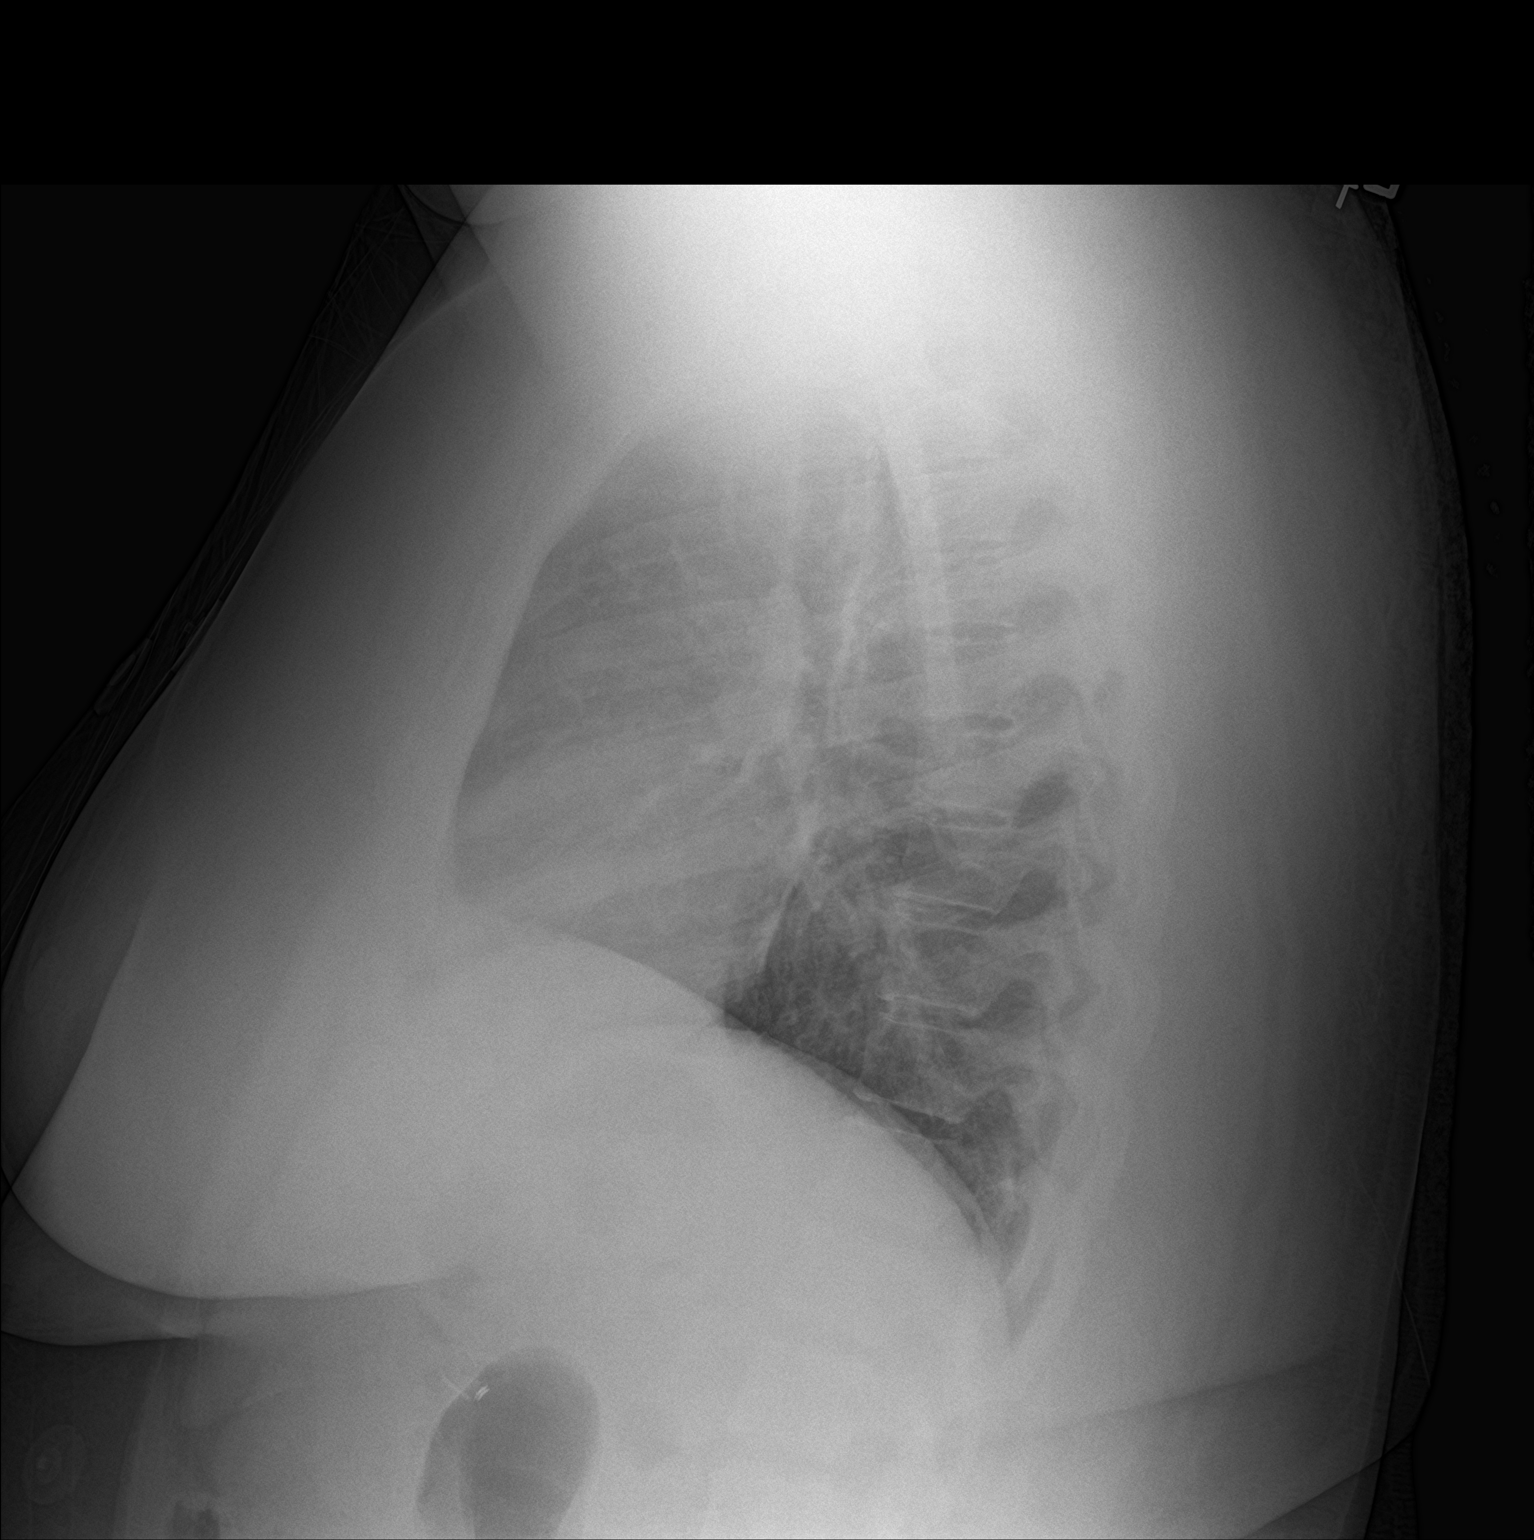

[2 of 2 positions shown; findings below may reference images not displayed]

FINDINGS: The cardiomediastinal contours are normal. Peribronchial thickening.
Pulmonary vasculature is normal. No consolidation, pleural effusion,
or pneumothorax. No acute osseous abnormalities are seen.
IMPRESSION: Peribronchial thickening, can be seen with acute bronchitis or
reactive airways disease.

## 2017-04-07 NOTE — ED Triage Notes (Addendum)
Reports having sore throat, chills, SOB, minor ear ache. All symptoms but ear ache started yesterday.  [facility]  Started on cetirizine, cough DM, sinus pressure and pain medication.  Last dose was at 9am.  [facility].  Also c/o chest pain.

## 2017-04-08 ENCOUNTER — Emergency Department (HOSPITAL_COMMUNITY)
Admission: EM | Admit: 2017-04-08 | Discharge: 2017-04-08 | Disposition: A | Discharge status: Home / Self Care | Payer: Medicaid Other | Attending: Emergency Medicine | Admitting: Emergency Medicine

## 2017-04-08 DIAGNOSIS — J0100 Acute maxillary sinusitis, unspecified: Secondary | ICD-10-CM

## 2017-04-08 MED ORDER — FLUTICASONE PROPIONATE 50 MCG/ACT NA SUSP: 1.0000 | Freq: Every day | NASAL | 0 refills | Status: DC

## 2017-04-08 MED ORDER — AMOXICILLIN-POT CLAVULANATE 875-125 MG PO TABS: 1.0000 | ORAL_TABLET | Freq: Two times a day (BID) | ORAL | 0 refills | Status: DC

## 2017-04-08 MED ORDER — DEXAMETHASONE SODIUM PHOSPHATE 10 MG/ML IJ SOLN
10.0000 mg | Freq: Once | INTRAMUSCULAR | Status: AC
  Administered 2017-04-08: 10 mg via INTRAMUSCULAR
  Filled 2017-04-08: qty 1

## 2017-04-08 NOTE — Discharge Instructions (Signed)
Please read and follow all provided instructions.  Your diagnoses today include:  1. Acute non-recurrent maxillary sinusitis     Tests performed today include: Vital signs. See below for your results today.   Medications prescribed:  Take as prescribed. Take antibiotic in 1 week if not better  Home care instructions:  Follow any educational materials contained in this packet.  Follow-up instructions: Please follow-up with your primary care provider for further evaluation of symptoms and treatment   Return instructions:  Please return to the Emergency Department if you do not get better, if you get worse, or new symptoms OR  - Fever (temperature greater than 101.49F)  - Bleeding that does not stop with holding pressure to the area    -Severe pain (please note that you may be more sore the day after your accident)  - Chest Pain  - Difficulty breathing  - Severe nausea or vomiting  - Inability to tolerate food and liquids  - Passing out  - Skin becoming red around your wounds  - Change in mental status (confusion or lethargy)  - New numbness or weakness    Please return if you have any other emergent concerns.  Additional Information:  Your vital signs today were: BP (!) 176/102 (BP Location: Left Arm)    Pulse (!) 113    Temp 99.8 F (37.7 C) (Oral)    Resp (!) 24    Ht 5' 2.75" (1.594 m)    Wt 99.8 kg (220 lb)    LMP 04/05/2017    SpO2 97%    BMI 39.28 kg/m  If your blood pressure (BP) was elevated above 135/85 this visit, please have this repeated by your doctor within one month. ---------------

## 2017-04-08 NOTE — ED Provider Notes (Signed)
[facility] EMERGENCY DEPARTMENT Provider Note   CSN: [id number] Arrival date & time: 04/07/17  2232     History   Chief Complaint Chief Complaint  Patient presents with  . Cough  . Sore Throat  . Shortness of Breath  . Chest Pain    HPI Robin Davis is a 36 y.o. female.  HPI  36 y.o. female with a hx of HTN, Obesity, presents to the Emergency Department today due to URI symptoms x 2-3 days. Seen at [facility] on 04-06-17 for same. DCed with Cetirizine, Cough DM. Last dose 9am. Strep at [facility] negative. Pt states symptoms have not improved. Associated sore throat, chills, SOB and ear ache. No N/V/D. No CP/SOB/ABD pain. Rates body aches 6/10. Aching sensation. No other symptoms noted    Past Medical History:  Diagnosis Date  . Anxiety   . Foot pain   . Hypertension   . Obesity     There are no active problems to display for this patient.   Past Surgical History:  Procedure Laterality Date  . BREAST REDUCTION SURGERY    . CHOLECYSTECTOMY      OB History    Gravida Para Term Preterm AB Living   5 3 2 1 2 2    SAB TAB Ectopic Multiple Live Births                   Home Medications    Prior to Admission medications   Medication Sig Start Date End Date Taking? Authorizing Provider  albuterol (PROVENTIL HFA;VENTOLIN HFA) 108 (90 BASE) MCG/ACT inhaler Inhale 1-2 puffs into the lungs every 6 (six) hours as needed for wheezing or shortness of breath. 04/15/14   Elpidio Anis, PA-C  benzonatate (TESSALON) 100 MG capsule Take 1-2 capsules (100-200 mg total) by mouth 3 (three) times daily as needed for cough. 04/06/17   Wallis Bamberg, PA-C  cetirizine (ZYRTEC ALLERGY) 10 MG tablet Take 1 tablet (10 mg total) by mouth daily. 04/06/17   Wallis Bamberg, PA-C  doxycycline (VIBRAMYCIN) 100 MG capsule Take 1 capsule (100 mg total) by mouth 2 (two) times daily. One po bid x 7 days 11/17/15   Pricilla Loveless, MD  doxycycline (VIBRAMYCIN) 100 MG capsule Take 1 capsule (100  mg total) by mouth 2 (two) times daily. 08/24/16   Cathren Laine, MD  fluvoxaMINE (LUVOX) 50 MG tablet Take 50 mg by mouth daily. 10/15/15   [provider]  hydrOXYzine (ATARAX/VISTARIL) 25 MG tablet Take 1 tablet (25 mg total) by mouth every 6 (six) hours as needed for anxiety, itching or nausea. Patient not taking: Reported on 11/17/2015 10/07/15   Fayrene Helper, PA-C  ibuprofen (ADVIL,MOTRIN) 600 MG tablet Take 1 tablet (600 mg total) by mouth every 6 (six) hours as needed. Patient not taking: Reported on 05/12/2015 04/10/15   Mady Gemma, PA-C  levonorgestrel [facility]) 20 MCG/24HR IUD 1 each by Intrauterine route once. Implanted October 2014    [provider]  losartan-hydrochlorothiazide (HYZAAR) 100-12.5 MG tablet Take 1 tablet by mouth daily. 10/29/15   [provider]  meloxicam (MOBIC) 7.5 MG tablet Take 7.5 mg by mouth daily.    [provider]  naproxen sodium (ALEVE) 220 MG tablet Take 2 tablets twice a day as needed for chest wall pain. Patient not taking: Reported on 08/02/2014 04/18/13   Molpus, John, MD  phentermine (ADIPEX-P) 37.5 MG tablet Take 37.5 mg by mouth daily. 03/13/14   [provider]  pseudoephedrine (SUDAFED 12  HOUR) 120 MG 12 hr tablet Take 1 tablet (120 mg total) by mouth 2 (two) times daily. 04/06/17   Wallis BambergMani, Mario, PA-C    Family History Family History  Problem Relation Age of Onset  . Hypertension Other   . Diabetes Other     Social History Social History   Tobacco Use  . Smoking status: Never Smoker  . Smokeless tobacco: Never Used  Substance Use Topics  . Alcohol use: Yes    Comment: seldom  . Drug use: No     Allergies   Fish allergy; Lisinopril; Macrobid [nitrofurantoin macrocrystal]; Tramadol; Flagyl [metronidazole hcl]; and Vicodin [hydrocodone-acetaminophen]   Review of Systems Review of Systems ROS reviewed and all are negative for acute change except as noted in the HPI.  Physical  Exam Updated Vital Signs BP (!) 176/102 (BP Location: Left Arm)   Pulse (!) 113   Temp 99.8 F (37.7 C) (Oral)   Resp (!) 24   Ht 5' 2.75" (1.594 m)   Wt 99.8 kg (220 lb)   LMP 04/05/2017   SpO2 97%   BMI 39.28 kg/m   Physical Exam  Constitutional: She is oriented to person, place, and time. She appears well-developed and well-nourished. No distress.  HENT:  Head: Normocephalic and atraumatic.  Right Ear: Tympanic membrane, external ear and ear canal normal.  Left Ear: Tympanic membrane, external ear and ear canal normal.  Nose: Nose normal.  Mouth/Throat: Uvula is midline, oropharynx is clear and moist and mucous membranes are normal. No trismus in the jaw. No oropharyngeal exudate, posterior oropharyngeal erythema or tonsillar abscesses.  Eyes: EOM are normal. Pupils are equal, round, and reactive to light.  Neck: Normal range of motion. Neck supple. No tracheal deviation present.  Cardiovascular: Regular rhythm, S1 normal, S2 normal, normal heart sounds, intact distal pulses and normal pulses. Tachycardia present.  Pulmonary/Chest: Effort normal and breath sounds normal. No respiratory distress. She has no decreased breath sounds. She has no wheezes. She has no rhonchi. She has no rales.  Abdominal: Normal appearance and bowel sounds are normal. There is no tenderness.  Musculoskeletal: Normal range of motion.  Neurological: She is alert and oriented to person, place, and time.  Skin: Skin is warm and dry.  Psychiatric: She has a normal mood and affect. Her speech is normal and behavior is normal. Thought content normal.     ED Treatments / Results  Labs (all labs ordered are listed, but only abnormal results are displayed) Labs Reviewed  BASIC METABOLIC PANEL - Abnormal; Notable for the following components:      Result Value   Potassium 3.4 (*)    Glucose, Bld 115 (*)    All other components within normal limits  CBC - Abnormal; Notable for the following components:    WBC 11.4 (*)    All other components within normal limits  I-STAT TROPONIN, ED  I-STAT BETA HCG BLOOD, ED ([facility], [facility], [facility] ONLY)    EKG  EKG Interpretation None       Radiology Dg Chest 2 View  Result Date: 04/07/2017 CLINICAL DATA:  Chest pain, shortness of breath and cough. EXAM: CHEST  2 VIEW COMPARISON:  08/24/2016 FINDINGS: The cardiomediastinal contours are normal. Peribronchial thickening. Pulmonary vasculature is normal. No consolidation, pleural effusion, or pneumothorax. No acute osseous abnormalities are seen. IMPRESSION: Peribronchial thickening, can be seen with acute bronchitis or reactive airways disease. Electronically Signed   By: Rubye OaksMelanie  Ehinger M.D.   On: 04/07/2017 23:14  Procedures Procedures (including critical care time)  Medications Ordered in ED Medications - No data to display   Initial Impression / Assessment and Plan / ED Course  I have reviewed the triage vital signs and the nursing notes.  Pertinent labs & imaging results that were available during my care of the patient were reviewed by me and considered in my medical decision making (see chart for details).  Final Clinical Impressions(s) / ED Diagnoses  {I have reviewed and evaluated the relevant laboratory values. {I have reviewed and evaluated the relevant imaging studies. {I have interpreted the relevant EKG. {I have reviewed the relevant previous healthcare records.  {I obtained HPI from historian.   ED Course:  Assessment: Pt is a 36 y.o. female presents with URI symptoms x 2-3 days. [facility] for same . On exam, pt in NAD. VSS. Afebrile. Lungs CTA, Heart RRR. Abdomen nontender/soft. Pt CXR negative for acute infiltrate. Labs reassuring. Patients symptoms are consistent with URI, likely viral etiology. Discussed that antibiotics are not indicated for viral infections. Given Rx for ABx to use in 1 week. Given decadron for sinusitis. Pt will be discharged with symptomatic treatment.  Verbalizes  understanding and is agreeable with plan. Pt is hemodynamically stable & in NAD prior to dc  Disposition/Plan:  DC Home Additional Verbal discharge instructions given and discussed with patient.  Pt Instructed to f/u with PCP in the next week for evaluation and treatment of symptoms. Return precautions given Pt acknowledges and agrees with plan  Supervising Physician Azalia Bilisampos, Kevin, MD  Final diagnoses:  Acute non-recurrent maxillary sinusitis    ED Discharge Orders    None       Audry PiliMohr, Yukiko Minnich, PA-C 04/08/17 0150    Azalia Bilisampos, Kevin, MD 04/08/17 [id number]

## 2017-04-09 LAB — CULTURE, GROUP A STREP (THRC)

## 2017-04-10 ENCOUNTER — Encounter (HOSPITAL_COMMUNITY): Payer: Self-pay

## 2017-04-10 ENCOUNTER — Other Ambulatory Visit: Payer: Self-pay

## 2017-04-10 DIAGNOSIS — R51 Headache: Secondary | ICD-10-CM | POA: Diagnosis present

## 2017-04-10 DIAGNOSIS — I10 Essential (primary) hypertension: Secondary | ICD-10-CM | POA: Insufficient documentation

## 2017-04-10 DIAGNOSIS — J0120 Acute ethmoidal sinusitis, unspecified: Secondary | ICD-10-CM | POA: Diagnosis not present

## 2017-04-10 DIAGNOSIS — Z79899 Other long term (current) drug therapy: Secondary | ICD-10-CM | POA: Insufficient documentation

## 2017-04-10 NOTE — ED Triage Notes (Signed)
Pt endorses headache since yesterday. No neuro sx. VSS.

## 2017-04-11 ENCOUNTER — Emergency Department (HOSPITAL_COMMUNITY)
Admission: EM | Admit: 2017-04-11 | Discharge: 2017-04-11 | Disposition: A | Discharge status: Home / Self Care | Payer: Medicaid Other | Attending: Physician Assistant | Admitting: Physician Assistant

## 2017-04-11 DIAGNOSIS — J0120 Acute ethmoidal sinusitis, unspecified: Secondary | ICD-10-CM

## 2017-04-11 DIAGNOSIS — R519 Headache, unspecified: Secondary | ICD-10-CM

## 2017-04-11 DIAGNOSIS — R51 Headache: Secondary | ICD-10-CM

## 2017-04-11 MED ORDER — IBUPROFEN 200 MG PO TABS
600.0000 mg | ORAL_TABLET | Freq: Once | ORAL | Status: AC
  Administered 2017-04-11: 600 mg via ORAL
  Filled 2017-04-11: qty 1

## 2017-04-11 NOTE — ED Provider Notes (Signed)
[facility]   CSN: [id number] Arrival date & time: 04/10/17  2213     History   Chief Complaint Chief Complaint  Patient presents with  . Headache    HPI Robin Davis is a 36 y.o. female.  Patient returns to the emergency department with new onset headache that started yesterday and gradually became worse. The HA is located in bilateral temporal and frontal areas, including behind the eyes. She has been seen for URI symptoms of sore throat, congestion and reports that these symptoms improved with IM Decadron provided on last visit. However, the headache was a new concern prompting her return for emergency evaluation. No vomiting, fever, visual changes. She reports light sensitivity. No history of migraine headache. The HA is worse when she leans forward to goes to a standing position. She describes throbbing pain.    The history is provided by the patient. No language interpreter was used.  Headache   Pertinent negatives include no fever, no nausea and no vomiting.    Past Medical History:  Diagnosis Date  . Anxiety   . Foot pain   . Hypertension   . Obesity     There are no active problems to display for this patient.   Past Surgical History:  Procedure Laterality Date  . BREAST REDUCTION SURGERY    . CHOLECYSTECTOMY      OB History    Gravida Para Term Preterm AB Living   5 3 2 1 2 2    SAB TAB Ectopic Multiple Live Births                   Home Medications    Prior to Admission medications   Medication Sig Start Date End Date Taking? Authorizing Provider  albuterol (PROVENTIL HFA;VENTOLIN HFA) 108 (90 BASE) MCG/ACT inhaler Inhale 1-2 puffs into the lungs every 6 (six) hours as needed for wheezing or shortness of breath. 04/15/14   Elpidio AnisUpstill, Jariel Drost, PA-C  amoxicillin-clavulanate (AUGMENTIN) 875-125 MG tablet Take 1 tablet by mouth every 12 (twelve) hours. 04/08/17   Audry PiliMohr, Tyler, PA-C  benzonatate  (TESSALON) 100 MG capsule Take 1-2 capsules (100-200 mg total) by mouth 3 (three) times daily as needed for cough. 04/06/17   Wallis BambergMani, Mario, PA-C  cetirizine (ZYRTEC ALLERGY) 10 MG tablet Take 1 tablet (10 mg total) by mouth daily. 04/06/17   Wallis BambergMani, Mario, PA-C  doxycycline (VIBRAMYCIN) 100 MG capsule Take 1 capsule (100 mg total) by mouth 2 (two) times daily. One po bid x 7 days 11/17/15   Pricilla LovelessGoldston, Scott, MD  doxycycline (VIBRAMYCIN) 100 MG capsule Take 1 capsule (100 mg total) by mouth 2 (two) times daily. 08/24/16   Cathren LaineSteinl, Kevin, MD  fluticasone (FLONASE) 50 MCG/ACT nasal spray Place 1 spray into both nostrils daily. 04/08/17   Audry PiliMohr, Tyler, PA-C  fluvoxaMINE (LUVOX) 50 MG tablet Take 50 mg by mouth daily. 10/15/15   [provider]  hydrOXYzine (ATARAX/VISTARIL) 25 MG tablet Take 1 tablet (25 mg total) by mouth every 6 (six) hours as needed for anxiety, itching or nausea. Patient not taking: Reported on 11/17/2015 10/07/15   Fayrene Helperran, Bowie, PA-C  ibuprofen (ADVIL,MOTRIN) 600 MG tablet Take 1 tablet (600 mg total) by mouth every 6 (six) hours as needed. Patient not taking: Reported on 05/12/2015 04/10/15   Mady GemmaWestfall, Elizabeth C, PA-C  levonorgestrel [facility](MIRENA) 20 MCG/24HR IUD 1 each by Intrauterine route once. Implanted October 2014    [provider]  losartan-hydrochlorothiazide [facility](HYZAAR) 100-12.5  MG tablet Take 1 tablet by mouth daily. 10/29/15   [provider]  meloxicam (MOBIC) 7.5 MG tablet Take 7.5 mg by mouth daily.    [provider]  naproxen sodium (ALEVE) 220 MG tablet Take 2 tablets twice a day as needed for chest wall pain. Patient not taking: Reported on 08/02/2014 04/18/13   Molpus, John, MD  phentermine (ADIPEX-P) 37.5 MG tablet Take 37.5 mg by mouth daily. 03/13/14   [provider]  pseudoephedrine (SUDAFED 12 HOUR) 120 MG 12 hr tablet Take 1 tablet (120 mg total) by mouth 2 (two) times daily. 04/06/17   Wallis Bamberg, PA-C    Family History Family  History  Problem Relation Age of Onset  . Hypertension Other   . Diabetes Other     Social History Social History   Tobacco Use  . Smoking status: Never Smoker  . Smokeless tobacco: Never Used  Substance Use Topics  . Alcohol use: Yes    Comment: seldom  . Drug use: No     Allergies   Fish allergy; Lisinopril; Macrobid [nitrofurantoin macrocrystal]; Tramadol; Flagyl [metronidazole hcl]; and Vicodin [hydrocodone-acetaminophen]   Review of Systems Review of Systems  Constitutional: Negative for chills and fever.  HENT: Positive for congestion and sinus pain. Negative for sore throat.   Eyes: Positive for photophobia.  Respiratory: Positive for cough.   Cardiovascular: Negative.   Gastrointestinal: Negative.  Negative for abdominal pain, nausea and vomiting.  Musculoskeletal: Negative.  Negative for myalgias.  Skin: Negative.  Negative for rash.  Neurological: Positive for headaches.     Physical Exam Updated Vital Signs BP (!) 148/98 (BP Location: Left Arm)   Pulse 92   Temp 98.5 F (36.9 C) (Oral)   Resp 16   Ht 5' 2.75" (1.594 m)   Wt 99.8 kg (220 lb)   LMP 04/05/2017 (Exact Date)   SpO2 100%   BMI 39.28 kg/m   Physical Exam  Constitutional: She is oriented to person, place, and time. She appears well-developed and well-nourished.  HENT:  Head: Normocephalic.  Nose: Mucosal edema present. Right sinus exhibits maxillary sinus tenderness. Left sinus exhibits maxillary sinus tenderness.  Nasal mucosa erythematous.  Neck: Normal range of motion. Neck supple.  Cardiovascular: Normal rate and regular rhythm.  Pulmonary/Chest: Effort normal and breath sounds normal. She has no wheezes. She has no rales. She exhibits no tenderness.  Abdominal: Soft. Bowel sounds are normal. There is no tenderness. There is no rebound and no guarding.  Musculoskeletal: Normal range of motion.  Neurological: She is alert and oriented to person, place, and time. She has normal  strength. Coordination and gait normal.  CN's 3-12 grossly intact. Speech is clear and focused. No facial asymmetry. No lateralizing weakness.  No deficits of coordination. Ambulatory without imbalance.    Skin: Skin is warm and dry. No rash noted.  Psychiatric: She has a normal mood and affect.     ED Treatments / Results  Labs (all labs ordered are listed, but only abnormal results are displayed) Labs Reviewed - No data to display  EKG  EKG Interpretation None       Radiology No results found.  Procedures Procedures (including critical care time)  Medications Ordered in ED Medications - No data to display   Initial Impression / Assessment and Plan / ED Course  I have reviewed the triage vital signs and the nursing notes.  Pertinent labs & imaging results that were available during my care of the patient were  reviewed by me and considered in my medical decision making (see chart for details).     Patient was given prescriptions on previous visit 2 days ago for sinus infection to be filled if symptoms worsened. She is encouraged to fill these prescriptions for treatment of sinusitis.   Final Clinical Impressions(s) / ED Diagnoses   Final diagnoses:  None   1. Sinusitis  ED Discharge Orders    None       Elpidio AnisUpstill, Rad Gramling, PA-C 04/11/17 [id number]    Abelino DerrickMackuen, Courteney Lyn, MD 04/11/17 [phone]

## 2017-04-11 NOTE — ED Notes (Signed)
Shari, PA at the bedside.  

## 2017-04-11 NOTE — Discharge Instructions (Signed)
Fill the prescriptions given to you on previous visit and take as directed. Return here as needed.

## 2017-06-20 ENCOUNTER — Emergency Department (HOSPITAL_COMMUNITY)
Admission: EM | Admit: 2017-06-20 | Discharge: 2017-06-20 | Disposition: A | Discharge status: Home / Self Care | Payer: Medicaid Other | Attending: Emergency Medicine | Admitting: Emergency Medicine

## 2017-06-20 ENCOUNTER — Encounter (HOSPITAL_COMMUNITY): Payer: Self-pay | Admitting: Emergency Medicine

## 2017-06-20 DIAGNOSIS — I10 Essential (primary) hypertension: Secondary | ICD-10-CM | POA: Diagnosis not present

## 2017-06-20 DIAGNOSIS — T7840XA Allergy, unspecified, initial encounter: Secondary | ICD-10-CM | POA: Diagnosis not present

## 2017-06-20 DIAGNOSIS — H579 Unspecified disorder of eye and adnexa: Secondary | ICD-10-CM | POA: Diagnosis present

## 2017-06-20 DIAGNOSIS — Z79899 Other long term (current) drug therapy: Secondary | ICD-10-CM | POA: Diagnosis not present

## 2017-06-20 MED ORDER — DIPHENHYDRAMINE HCL 25 MG PO CAPS
25.0000 mg | ORAL_CAPSULE | Freq: Once | ORAL | Status: AC
  Administered 2017-06-20: 25 mg via ORAL
  Filled 2017-06-20: qty 1

## 2017-06-20 NOTE — ED Provider Notes (Signed)
[facility]   CSN: [id number] Arrival date & time: 06/20/17  0117     History   Chief Complaint Chief Complaint  Patient presents with  . Eye Irritation    HPI Robin Davis is a 37 y.o. female.  Patient presents to the emergency department with a chief complaint of eye irritation.  She states that she touched her eyelid after applying lotion tonight, noticed that it became mildly swollen and itchy.  She has not taken anything for symptoms.  She denies any vision changes.  She denies any pain.  She denies any flashes or floaters.   The history is provided by the patient. No language interpreter was used.    Past Medical History:  Diagnosis Date  . Anxiety   . Foot pain   . Hypertension   . Obesity     There are no active problems to display for this patient.   Past Surgical History:  Procedure Laterality Date  . BREAST REDUCTION SURGERY    . CHOLECYSTECTOMY      OB History    Gravida Para Term Preterm AB Living   5 3 2 1 2 2    SAB TAB Ectopic Multiple Live Births                   Home Medications    Prior to Admission medications   Medication Sig Start Date End Date Taking? Authorizing Provider  albuterol (PROVENTIL HFA;VENTOLIN HFA) 108 (90 BASE) MCG/ACT inhaler Inhale 1-2 puffs into the lungs every 6 (six) hours as needed for wheezing or shortness of breath. 04/15/14   Elpidio AnisUpstill, Shari, PA-C  amoxicillin-clavulanate (AUGMENTIN) 875-125 MG tablet Take 1 tablet by mouth every 12 (twelve) hours. 04/08/17   Audry PiliMohr, Tyler, PA-C  benzonatate (TESSALON) 100 MG capsule Take 1-2 capsules (100-200 mg total) by mouth 3 (three) times daily as needed for cough. 04/06/17   Wallis BambergMani, Mario, PA-C  cetirizine (ZYRTEC ALLERGY) 10 MG tablet Take 1 tablet (10 mg total) by mouth daily. 04/06/17   Wallis BambergMani, Mario, PA-C  doxycycline (VIBRAMYCIN) 100 MG capsule Take 1 capsule (100 mg total) by mouth 2 (two) times daily. One po bid x 7  days 11/17/15   Pricilla LovelessGoldston, Scott, MD  doxycycline (VIBRAMYCIN) 100 MG capsule Take 1 capsule (100 mg total) by mouth 2 (two) times daily. 08/24/16   Cathren LaineSteinl, Kevin, MD  fluticasone (FLONASE) 50 MCG/ACT nasal spray Place 1 spray into both nostrils daily. 04/08/17   Audry PiliMohr, Tyler, PA-C  fluvoxaMINE (LUVOX) 50 MG tablet Take 50 mg by mouth daily. 10/15/15   [provider]  hydrOXYzine (ATARAX/VISTARIL) 25 MG tablet Take 1 tablet (25 mg total) by mouth every 6 (six) hours as needed for anxiety, itching or nausea. Patient not taking: Reported on 11/17/2015 10/07/15   Fayrene Helperran, Bowie, PA-C  ibuprofen (ADVIL,MOTRIN) 600 MG tablet Take 1 tablet (600 mg total) by mouth every 6 (six) hours as needed. Patient not taking: Reported on 05/12/2015 04/10/15   Mady GemmaWestfall, Elizabeth C, PA-C  levonorgestrel [facility](MIRENA) 20 MCG/24HR IUD 1 each by Intrauterine route once. Implanted October 2014    [provider]  losartan-hydrochlorothiazide (HYZAAR) 100-12.5 MG tablet Take 1 tablet by mouth daily. 10/29/15   [provider]  meloxicam (MOBIC) 7.5 MG tablet Take 7.5 mg by mouth daily.    [provider]  naproxen sodium (ALEVE) 220 MG tablet Take 2 tablets twice a day as needed for chest wall pain. Patient not taking: Reported  on 08/02/2014 04/18/13   Molpus, John, MD  phentermine (ADIPEX-P) 37.5 MG tablet Take 37.5 mg by mouth daily. 03/13/14   [provider]  pseudoephedrine (SUDAFED 12 HOUR) 120 MG 12 hr tablet Take 1 tablet (120 mg total) by mouth 2 (two) times daily. 04/06/17   Wallis Bamberg, PA-C    Family History Family History  Problem Relation Age of Onset  . Hypertension Other   . Diabetes Other     Social History Social History   Tobacco Use  . Smoking status: Never Smoker  . Smokeless tobacco: Never Used  Substance Use Topics  . Alcohol use: Yes    Comment: seldom  . Drug use: No     Allergies   Fish allergy; Lisinopril; Macrobid [nitrofurantoin macrocrystal];  Tramadol; Flagyl [metronidazole hcl]; and Vicodin [hydrocodone-acetaminophen]   Review of Systems Review of Systems  All other systems reviewed and are negative.    Physical Exam Updated Vital Signs BP (!) 161/109 (BP Location: Right Arm)   Pulse (!) 101   Temp 97.8 F (36.6 C) (Oral)   Resp 20   Ht 5\' 2"  (1.575 m)   Wt 108.9 kg (240 lb)   SpO2 98%   BMI 43.90 kg/m   Physical Exam  Constitutional: She is oriented to person, place, and time. No distress.  HENT:  Head: Normocephalic and atraumatic.  Eyes: Conjunctivae and EOM are normal. Pupils are equal, round, and reactive to light.  Mild swelling of left eyelid and periorbital region, no evidence of cellulitis, abscess Normal EOMs  Neck: No tracheal deviation present.  Cardiovascular: Normal rate.  Pulmonary/Chest: Effort normal. No respiratory distress.  Abdominal: Soft.  Musculoskeletal: Normal range of motion.  Neurological: She is alert and oriented to person, place, and time.  Skin: Skin is warm and dry. She is not diaphoretic.  Psychiatric: Judgment normal.  Nursing note and vitals reviewed.    ED Treatments / Results  Labs (all labs ordered are listed, but only abnormal results are displayed) Labs Reviewed - No data to display  EKG  EKG Interpretation None       Radiology No results found.  Procedures Procedures (including critical care time)  Medications Ordered in ED Medications  diphenhydrAMINE (BENADRYL) capsule 25 mg (25 mg Oral Given 06/20/17 0330)     Initial Impression / Assessment and Plan / ED Course  I have reviewed the triage vital signs and the nursing notes.  Pertinent labs & imaging results that were available during my care of the patient were reviewed by me and considered in my medical decision making (see chart for details).     Patient with probable allergic contact to her left eye.  Symptoms resolved completely with Benadryl.  She has no vision changes.  She has no  evidence of infection.  Recommend continuing Benadryl for the next couple of days.  PCP follow-up.  Final Clinical Impressions(s) / ED Diagnoses   Final diagnoses:  Allergic state, initial encounter    ED Discharge Orders    None       Roxy Horseman, PA-C 06/20/17 0441    Gilda Crease, MD 06/20/17 [phone]

## 2017-06-20 NOTE — Discharge Instructions (Signed)
Continue Benadryl every 6 hours for 3 days.

## 2017-06-20 NOTE — ED Triage Notes (Signed)
Pt reports eye irritation onset within past hr. L eye red, itching, watering.

## 2018-02-09 ENCOUNTER — Other Ambulatory Visit (HOSPITAL_BASED_OUTPATIENT_CLINIC_OR_DEPARTMENT_OTHER): Payer: Self-pay

## 2018-02-09 DIAGNOSIS — G4730 Sleep apnea, unspecified: Secondary | ICD-10-CM

## 2018-03-12 ENCOUNTER — Ambulatory Visit (HOSPITAL_BASED_OUTPATIENT_CLINIC_OR_DEPARTMENT_OTHER): Payer: Medicaid Other | Attending: Specialist

## 2018-04-17 ENCOUNTER — Ambulatory Visit (HOSPITAL_BASED_OUTPATIENT_CLINIC_OR_DEPARTMENT_OTHER): Payer: Medicaid Other | Attending: Specialist

## 2018-05-13 ENCOUNTER — Other Ambulatory Visit: Payer: Self-pay

## 2018-05-13 ENCOUNTER — Emergency Department (HOSPITAL_COMMUNITY): Payer: Medicaid Other

## 2018-05-13 ENCOUNTER — Encounter (HOSPITAL_COMMUNITY): Payer: Self-pay | Admitting: Emergency Medicine

## 2018-05-13 ENCOUNTER — Emergency Department (HOSPITAL_COMMUNITY)
Admission: EM | Admit: 2018-05-13 | Discharge: 2018-05-13 | Disposition: A | Discharge status: Home / Self Care | Payer: Medicaid Other | Attending: Emergency Medicine | Admitting: Emergency Medicine

## 2018-05-13 DIAGNOSIS — I10 Essential (primary) hypertension: Secondary | ICD-10-CM | POA: Insufficient documentation

## 2018-05-13 DIAGNOSIS — R509 Fever, unspecified: Secondary | ICD-10-CM | POA: Diagnosis present

## 2018-05-13 DIAGNOSIS — J101 Influenza due to other identified influenza virus with other respiratory manifestations: Secondary | ICD-10-CM | POA: Insufficient documentation

## 2018-05-13 DIAGNOSIS — Z79899 Other long term (current) drug therapy: Secondary | ICD-10-CM | POA: Insufficient documentation

## 2018-05-13 DIAGNOSIS — J111 Influenza due to unidentified influenza virus with other respiratory manifestations: Secondary | ICD-10-CM

## 2018-05-13 LAB — INFLUENZA PANEL BY PCR (TYPE A & B)
Influenza A By PCR: NEGATIVE
Influenza B By PCR: POSITIVE — AB

## 2018-05-13 IMAGING — CR DG CHEST 2V
2 series · 2 of 2 positions shown · non-contrast
Comparison: 04/07/2017.

CLINICAL DATA: Productive cough with chills.

EXAM:
CHEST - 2 VIEW

[chest pa]
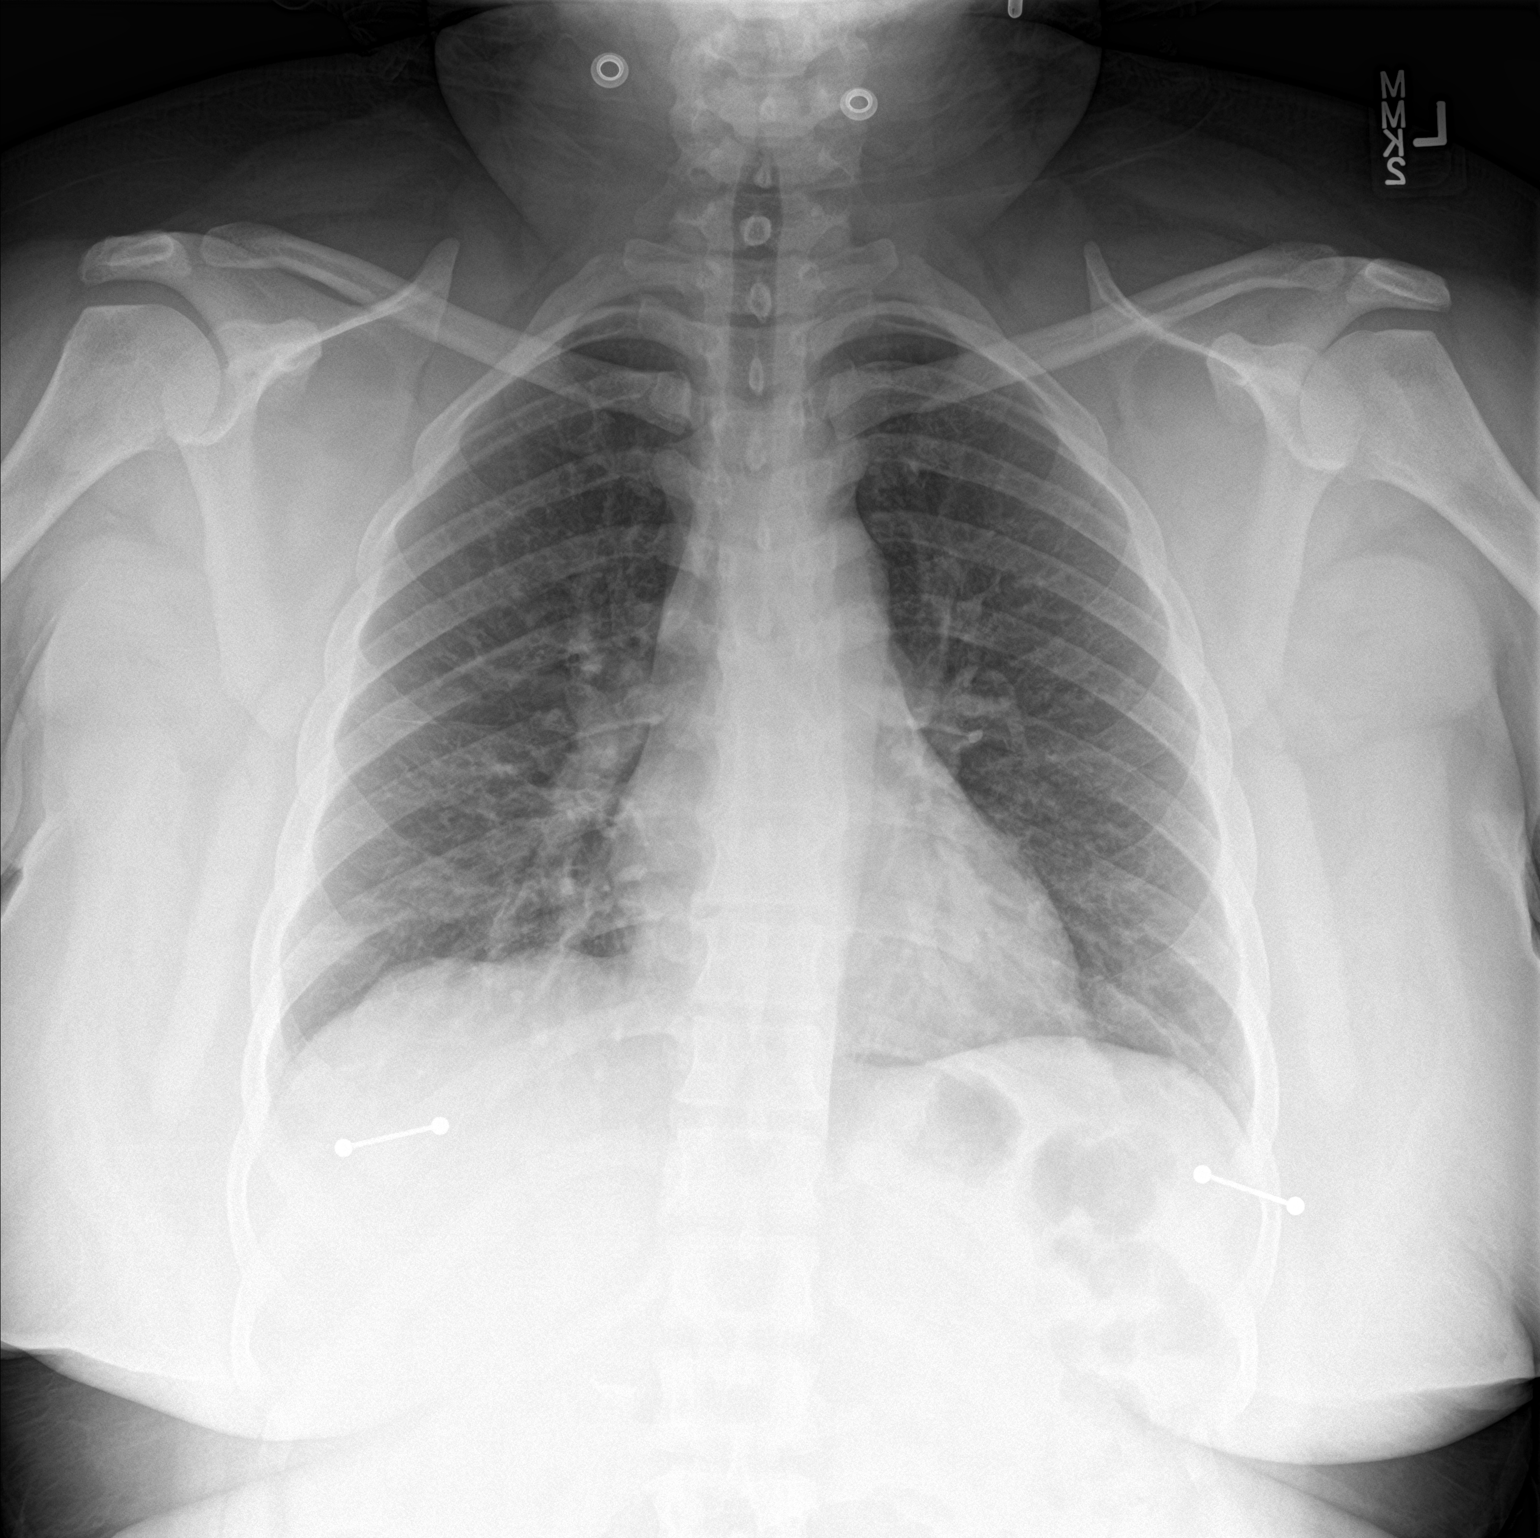

[chest lat]
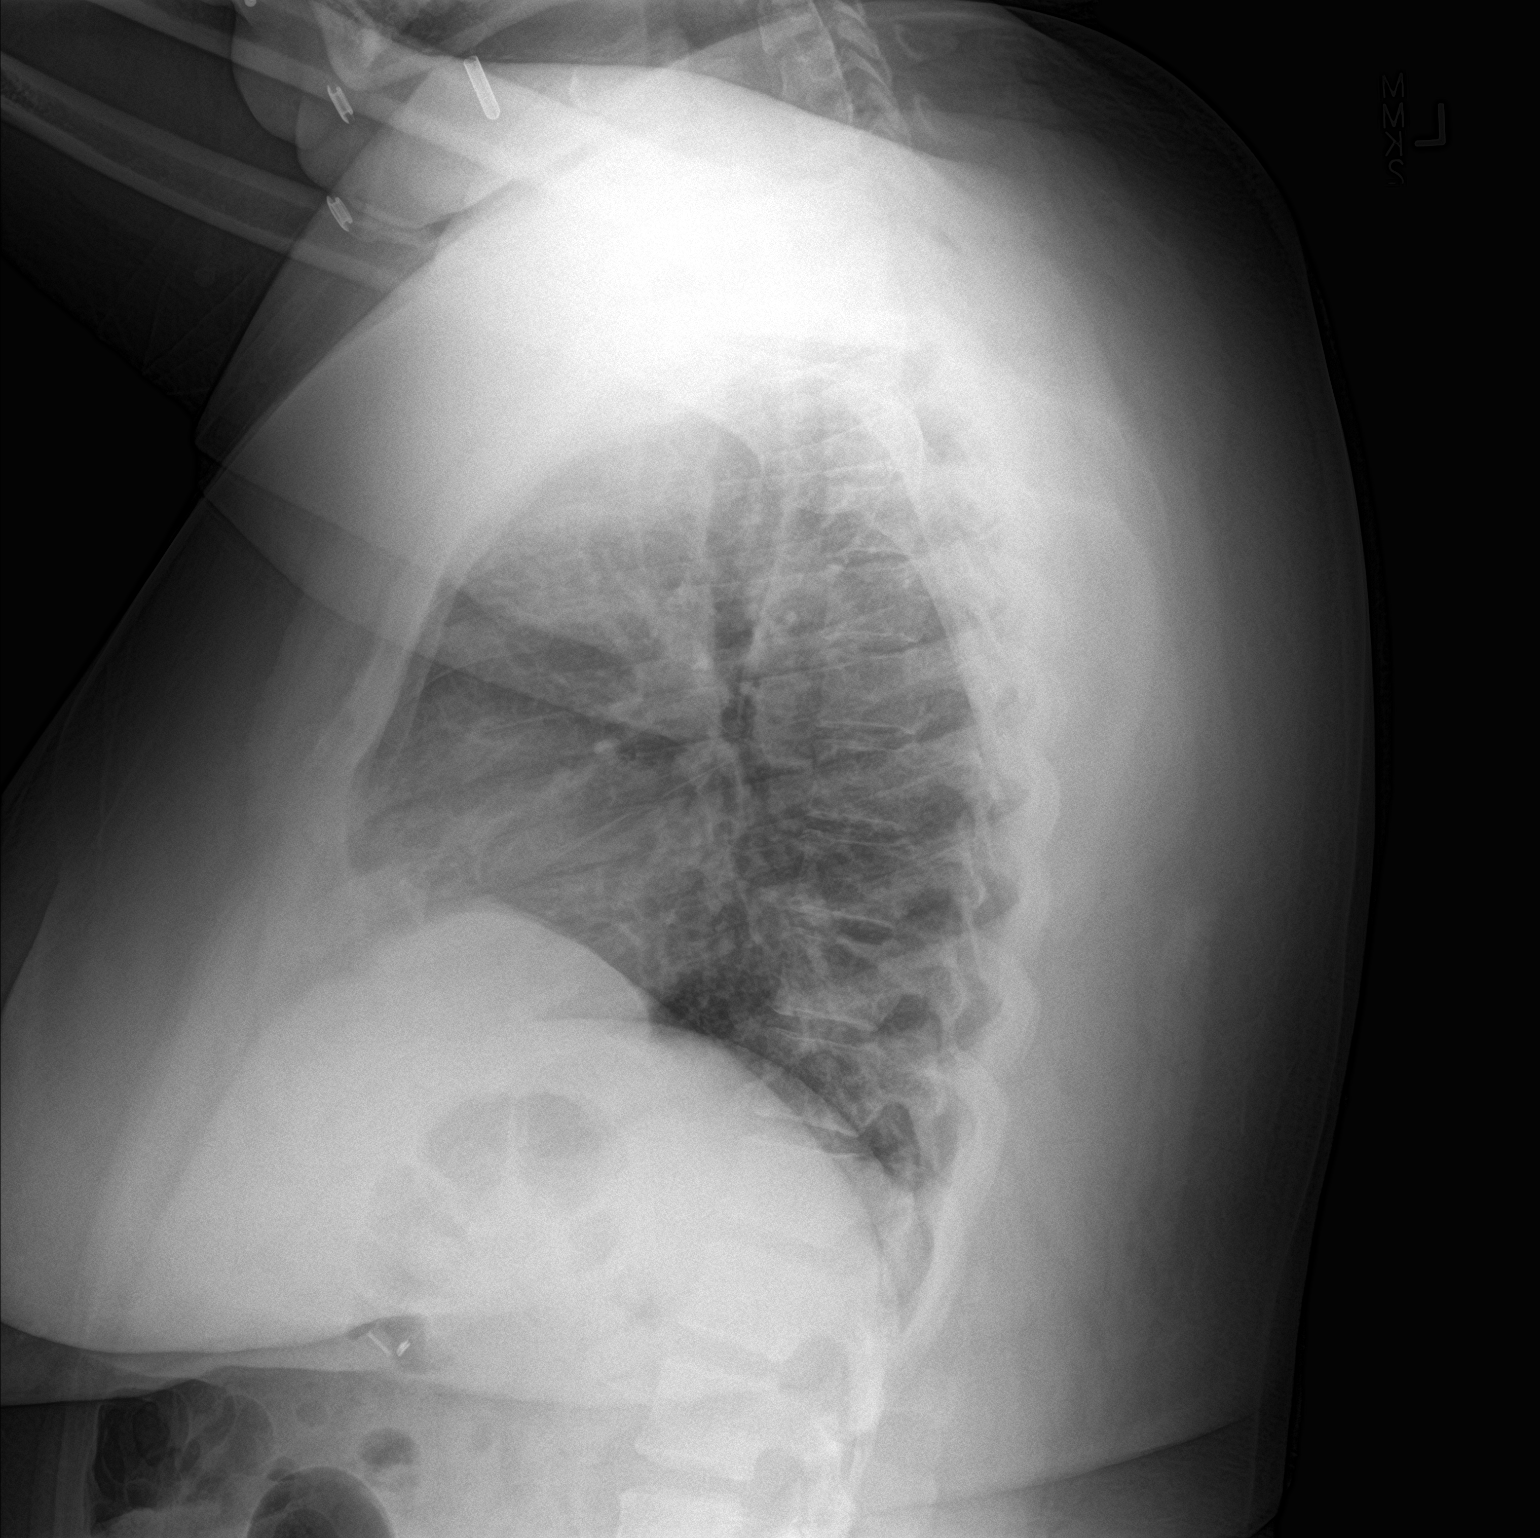

[2 of 2 positions shown; findings below may reference images not displayed]

FINDINGS: The heart size and mediastinal contours are within normal limits.
Both lungs are clear. The visualized skeletal structures are
unremarkable.
IMPRESSION: No active cardiopulmonary disease.  Improved appearance from priors.

## 2018-05-13 MED ORDER — ACETAMINOPHEN 325 MG PO TABS
650.0000 mg | ORAL_TABLET | Freq: Once | ORAL | Status: AC | PRN
  Administered 2018-05-13: 650 mg via ORAL
  Filled 2018-05-13: qty 2

## 2018-05-13 MED ORDER — DM-GUAIFENESIN ER 30-600 MG PO TB12: 1.0000 | ORAL_TABLET | Freq: Two times a day (BID) | ORAL | 0 refills | Status: DC | PRN

## 2018-05-13 MED ORDER — IBUPROFEN 200 MG PO TABS
600.0000 mg | ORAL_TABLET | Freq: Once | ORAL | Status: AC
  Administered 2018-05-13: 600 mg via ORAL
  Filled 2018-05-13: qty 1

## 2018-05-13 MED ORDER — OSELTAMIVIR PHOSPHATE 75 MG PO CAPS: 75.0000 mg | ORAL_CAPSULE | Freq: Two times a day (BID) | ORAL | 0 refills | Status: AC

## 2018-05-13 MED ORDER — PHENOL 1.4 % MT LIQD: 1.0000 | OROMUCOSAL | 0 refills | Status: DC | PRN

## 2018-05-13 NOTE — ED Triage Notes (Signed)
Pt reports productive cough, chills, body aches, nausea,  Diarrhea, no vomiting. Temp 102. Pt denies sick contact.

## 2018-05-13 NOTE — ED Notes (Signed)
Patient transported to X-ray 

## 2018-05-13 NOTE — Discharge Instructions (Signed)
Your flu test was positive for influenza B.  Your chest x-ray did not show any sign of pneumonia.  You can start taking Tamiflu as prescribed.  If you develop any mood disturbances, persistent nausea or vomiting, or other unpleasant side effects you can stop taking it.  Drink plenty of water and get plenty of rest. Alternate 600 mg of ibuprofen and [phone] mg of Tylenol every 3 hours as needed for pain/fever. Do not exceed 4000 mg of Tylenol daily.  Take ibuprofen with food to avoid upset stomach issues.  You can use over-the-counter medications such as Mucinex for your cough. Use warm water salt gargles, warm teas, throat lozenges/cough drops, and over-the-counter Chloraseptic spray for sore throat.  Follow-up with your primary care doctor if symptoms persist.  Return to the emergency department if any concerning signs or symptoms develop such as persistent shortness of breath, chest pains, persistent vomiting, or severe abdominal pains.

## 2018-05-13 NOTE — ED Provider Notes (Signed)
[facility] EMERGENCY DEPARTMENT Provider Note   CSN: [id number] Arrival date & time: 05/13/18  1537     History   Chief Complaint Chief Complaint  Patient presents with  . Influenza    HPI KAIELLE RESTA is a 38 y.o. female with history of anxiety, hypertension, obesity presenting for evaluation of acute onset, progressively worsening fever, generalized myalgias, cough since yesterday.  She notes cough productive of yellow sputum, chest tightness, and has had 2 episodes of watery nonbloody diarrhea.  Denies abdominal pain, nausea, or vomiting.  Denies significantly sore throat or nasal congestion.  Has taken Alka-Seltzer cold and flu this morning without significant relief.  Is a non-smoker.    The history is provided by the patient.    Past Medical History:  Diagnosis Date  . Anxiety   . Foot pain   . Hypertension   . Obesity     There are no active problems to display for this patient.   Past Surgical History:  Procedure Laterality Date  . BREAST REDUCTION SURGERY    . CHOLECYSTECTOMY       OB History    Gravida  5   Para  3   Term  2   Preterm  1   AB  2   Living  2     SAB      TAB      Ectopic      Multiple      Live Births               Home Medications    Prior to Admission medications   Medication Sig Start Date End Date Taking? Authorizing Provider  albuterol (PROVENTIL HFA;VENTOLIN HFA) 108 (90 BASE) MCG/ACT inhaler Inhale 1-2 puffs into the lungs every 6 (six) hours as needed for wheezing or shortness of breath. 04/15/14   Elpidio Anis, PA-C  amoxicillin-clavulanate (AUGMENTIN) 875-125 MG tablet Take 1 tablet by mouth every 12 (twelve) hours. 04/08/17   Audry Pili, PA-C  benzonatate (TESSALON) 100 MG capsule Take 1-2 capsules (100-200 mg total) by mouth 3 (three) times daily as needed for cough. 04/06/17   Wallis Bamberg, PA-C  cetirizine (ZYRTEC ALLERGY) 10 MG tablet Take 1 tablet (10 mg total) by mouth  daily. 04/06/17   Wallis Bamberg, PA-C  dextromethorphan-guaiFENesin [facility] DM) 30-600 MG 12hr tablet Take 1 tablet by mouth 2 (two) times daily as needed for cough. 05/13/18   Samul Mcinroy A, PA-C  doxycycline (VIBRAMYCIN) 100 MG capsule Take 1 capsule (100 mg total) by mouth 2 (two) times daily. One po bid x 7 days 11/17/15   Pricilla Loveless, MD  doxycycline (VIBRAMYCIN) 100 MG capsule Take 1 capsule (100 mg total) by mouth 2 (two) times daily. 08/24/16   Cathren Laine, MD  fluticasone (FLONASE) 50 MCG/ACT nasal spray Place 1 spray into both nostrils daily. 04/08/17   Audry Pili, PA-C  fluvoxaMINE (LUVOX) 50 MG tablet Take 50 mg by mouth daily. 10/15/15   [provider]  hydrOXYzine (ATARAX/VISTARIL) 25 MG tablet Take 1 tablet (25 mg total) by mouth every 6 (six) hours as needed for anxiety, itching or nausea. Patient not taking: Reported on 11/17/2015 10/07/15   Fayrene Helper, PA-C  ibuprofen (ADVIL,MOTRIN) 600 MG tablet Take 1 tablet (600 mg total) by mouth every 6 (six) hours as needed. Patient not taking: Reported on 05/12/2015 04/10/15   Mady Gemma, PA-C  levonorgestrel [facility]) 20 MCG/24HR IUD 1 each by Intrauterine route once. Implanted October  2014    [provider]  losartan-hydrochlorothiazide (HYZAAR) 100-12.5 MG tablet Take 1 tablet by mouth daily. 10/29/15   [provider]  meloxicam (MOBIC) 7.5 MG tablet Take 7.5 mg by mouth daily.    [provider]  naproxen sodium (ALEVE) 220 MG tablet Take 2 tablets twice a day as needed for chest wall pain. Patient not taking: Reported on 08/02/2014 04/18/13   Molpus, Jonny Ruiz, MD  oseltamivir (TAMIFLU) 75 MG capsule Take 1 capsule (75 mg total) by mouth every 12 (twelve) hours for 5 days. 05/13/18 05/18/18  Michela Pitcher A, PA-C  phenol (CHLORASEPTIC) 1.4 % LIQD Use as directed 1 spray in the mouth or throat as needed for throat irritation / pain. 05/13/18   Offie Pickron A, PA-C  phentermine (ADIPEX-P) 37.5 MG tablet Take  37.5 mg by mouth daily. 03/13/14   [provider]  pseudoephedrine (SUDAFED 12 HOUR) 120 MG 12 hr tablet Take 1 tablet (120 mg total) by mouth 2 (two) times daily. 04/06/17   Wallis Bamberg, PA-C    Family History Family History  Problem Relation Age of Onset  . Hypertension Other   . Diabetes Other     Social History Social History   Tobacco Use  . Smoking status: Never Smoker  . Smokeless tobacco: Never Used  Substance Use Topics  . Alcohol use: Yes    Comment: seldom  . Drug use: No     Allergies   Fish allergy; Lisinopril; Macrobid [nitrofurantoin macrocrystal]; Tramadol; Flagyl [metronidazole hcl]; and Vicodin [hydrocodone-acetaminophen]   Review of Systems Review of Systems  Constitutional: Positive for chills and fever.  HENT: Negative for congestion and sore throat.   Respiratory: Positive for cough and chest tightness.   Cardiovascular: Negative for chest pain.  Gastrointestinal: Positive for diarrhea. Negative for abdominal pain, nausea and vomiting.  Musculoskeletal: Positive for myalgias.  All other systems reviewed and are negative.    Physical Exam Updated Vital Signs BP (!) 145/64   Pulse (!) 131   Temp (!) 101.6 F (38.7 C) (Oral)   Resp 20   SpO2 94%   Physical Exam Vitals signs and nursing note reviewed.  Constitutional:      General: She is not in acute distress.    Appearance: She is well-developed.  HENT:     Head: Normocephalic and atraumatic.     Right Ear: Ear canal and external ear normal. There is no impacted cerumen.     Left Ear: Ear canal and external ear normal. There is no impacted cerumen.     Nose: No congestion.     Mouth/Throat:     Mouth: Mucous membranes are moist.     Pharynx: Oropharynx is clear. No oropharyngeal exudate or posterior oropharyngeal erythema.     Comments: Tolerating secretions without difficulty, no uvular deviation, trismus, or sublingual abnormalities Eyes:     General:        Right eye: No  discharge.        Left eye: No discharge.     Conjunctiva/sclera: Conjunctivae normal.  Neck:     Musculoskeletal: Normal range of motion and neck supple. No neck rigidity or muscular tenderness.     Vascular: No JVD.     Trachea: No tracheal deviation.  Cardiovascular:     Rate and Rhythm: Tachycardia present.  Pulmonary:     Effort: Pulmonary effort is normal.     Breath sounds: Normal breath sounds.     Comments: Frequent productive cough on examination.  Speaking in full sentences without difficulty.  Generalized chest tenderness with no focal pain, deformity, crepitus, ecchymosis, or flail segment Abdominal:     General: Bowel sounds are normal. There is no distension.     Tenderness: There is no abdominal tenderness. There is no guarding.  Lymphadenopathy:     Cervical: No cervical adenopathy.  Skin:    General: Skin is warm and dry.     Findings: No erythema.  Neurological:     Mental Status: She is alert.  Psychiatric:        Behavior: Behavior normal.      ED Treatments / Results  Labs (all labs ordered are listed, but only abnormal results are displayed) Labs Reviewed  INFLUENZA PANEL BY PCR (TYPE A & B) - Abnormal; Notable for the following components:      Result Value   Influenza B By PCR POSITIVE (*)    All other components within normal limits    EKG None  Radiology Dg Chest 2 View  Result Date: 05/13/2018 CLINICAL DATA:  Productive cough with chills. EXAM: CHEST - 2 VIEW COMPARISON:  04/07/2017. FINDINGS: The heart size and mediastinal contours are within normal limits. Both lungs are clear. The visualized skeletal structures are unremarkable. IMPRESSION: No active cardiopulmonary disease.  Improved appearance from priors. Electronically Signed   By: Elsie StainJohn T Curnes M.D.   On: 05/13/2018 17:47    Procedures Procedures (including critical care time)  Medications Ordered in ED Medications  acetaminophen (TYLENOL) tablet 650 mg (650 mg Oral Given 05/13/18  1601)  ibuprofen (ADVIL,MOTRIN) tablet 600 mg (600 mg Oral Given 05/13/18 1720)     Initial Impression / Assessment and Plan / ED Course  I have reviewed the triage vital signs and the nursing notes.  Pertinent labs & imaging results that were available during my care of the patient were reviewed by me and considered in my medical decision making (see chart for details).     Patient with symptoms consistent with influenza; positive for influenza B.  Chest x-ray negative for pneumonia.  Symptoms began yesterday.  Patient is febrile and tachycardic, with improvement in symptoms after administration of antipyretic.  No signs of significant dehydration, tolerating PO's.  Lungs are clear. Discussed the cost versus benefit of Tamiflu treatment with the patient.  She would like to proceed with Tamiflu treatment.  Patient will be discharged with instructions to orally hydrate, rest, and use over-the-counter medications such as anti-inflammatories ibuprofen and Aleve for muscle aches and Tylenol for fever.  Also give Mucinex for fever.  Discussed strict ED return precautions.  Recommend follow-up with PCP if symptoms persist. Pt verbalized understanding of and agreement with plan and is safe for discharge home at this time.   Final Clinical Impressions(s) / ED Diagnoses   Final diagnoses:  Influenza    ED Discharge Orders         Ordered    oseltamivir (TAMIFLU) 75 MG capsule  Every 12 hours     05/13/18 1824    dextromethorphan-guaiFENesin (MUCINEX DM) 30-600 MG 12hr tablet  2 times daily PRN     05/13/18 1824    phenol (CHLORASEPTIC) 1.4 % LIQD  As needed     05/13/18 1824           Jeanie SewerFawze, Kinesha Auten A, PA-C 05/13/18 1826    Terrilee FilesButler, Michael C, MD 05/14/18 [phone]

## 2018-05-13 NOTE — ED Notes (Signed)
Pt ambulatory to bathroom

## 2019-02-22 ENCOUNTER — Other Ambulatory Visit: Payer: Self-pay

## 2019-02-22 ENCOUNTER — Ambulatory Visit (HOSPITAL_BASED_OUTPATIENT_CLINIC_OR_DEPARTMENT_OTHER): Payer: Medicaid Other | Attending: Internal Medicine | Admitting: Internal Medicine

## 2019-02-22 VITALS — Ht 62.0 in | Wt 235.0 lb

## 2019-02-22 DIAGNOSIS — G4733 Obstructive sleep apnea (adult) (pediatric): Secondary | ICD-10-CM | POA: Diagnosis present

## 2019-02-22 DIAGNOSIS — G4710 Hypersomnia, unspecified: Secondary | ICD-10-CM

## 2019-02-22 DIAGNOSIS — R0902 Hypoxemia: Secondary | ICD-10-CM | POA: Insufficient documentation

## 2019-02-22 DIAGNOSIS — R0683 Snoring: Secondary | ICD-10-CM

## 2019-02-27 NOTE — Procedures (Signed)
   NAME: Robin Davis DATE OF BIRTH:  12-19-1980 MEDICAL RECORD NUMBER [id number]  LOCATION: [facility]  PHYSICIAN: Marius Ditch  DATE OF STUDY: 02/22/2019  SLEEP STUDY TYPE: Nocturnal Polysomnogram               REFERRING PHYSICIAN: Marius Ditch, MD  INDICATION FOR STUDY: history of OSA. Never treated. Test done to re-stablish diagnosis.   EPWORTH SLEEPINESS SCORE:  15 HEIGHT: 5\' 2"  (157.5 cm)  WEIGHT: 235 lb (106.6 kg)    Body mass index is 42.98 kg/m.  NECK SIZE: 18 in.  MEDICATIONS  Patient self administered medications include: MELATONIN. Medications administered during study include No sleep medicine administered.Marland Kitchen   SLEEP STUDY TECHNIQUE  A multi-channel overnight Polysomnography study was performed. The channels recorded and monitored were central and occipital EEG, electrooculogram (EOG), submentalis EMG (chin), nasal and oral airflow, thoracic and abdominal wall motion, anterior tibialis EMG, snore microphone, electrocardiogram, and a pulse oximetry.   TECHNICAL COMMENTS  Comments added by Technician: NONE Comments added by Scorer: N/A   SLEEP ARCHITECTURE  The study was initiated at 10:18:19 PM and terminated at 5:34:34 AM. The total recorded time was 436.2 minutes. EEG confirmed total sleep time was 321 minutes yielding a sleep efficiency of 73.6%%. Sleep onset after lights out was 62.6 minutes with a REM latency of 104.0 minutes. The patient spent 5.9%% of the night in stage N1 sleep, 72.9%% in stage N2 sleep, 0.5%% in stage N3 and 20.7% in REM. Wake after sleep onset (WASO) was 52.7 minutes. The Arousal Index was 18.3/hour.  RESPIRATORY PARAMETERS  There were a total of 212 respiratory disturbances out of which 48 were apneas ( 47 obstructive, 0 mixed, 1 central) and 164 hypopneas. The apnea/hypopnea index (AHI) was 39.6 events/hour. The central sleep apnea index was 0.2 events/hour. The REM AHI was 93.8 events/hour and NREM AHI was  25.5 events/hour. The supine AHI was 28.4 events/hour and the non supine AHI was 43/hr. Respiratory distrubance index (RDI) was 45/hr overall, 95/hr in REM sleep and 32/hr in NREM sleep. Respiratory disturbances were associated with oxygen desaturation down to a nadir of 62.0% during sleep. The mean oxygen saturation during the study was 92.8%. The cumulative time under 88% oxygen saturation was 28 minutes.  LEG MOVEMENT DATA  The total leg movements were 0 with a resulting leg movement index of 0.0/hr . Associated arousal with leg movement index was 0.0/hr.   CARDIAC DATA  The underlying cardiac rhythm was most consistent with sinus rhythm. Mean heart rate during sleep was 90.9 bpm. Additional rhythm abnormalities include PVCs.   IMPRESSIONS  Severe Obstructive Sleep apnea(OSA) No Significant Central Sleep Apnea (CSA)  Severe Oxygen Desaturation No significant periodic leg movements(PLMs) during sleep.   DIAGNOSIS  Obstructive Sleep Apnea (327.23 [G47.33 ICD-10])  Nocturnal Hypoxemia (327.26 [G47.36 ICD-10])  RECOMMENDATIONS  CPAP would be preferred treatment modality. This could be started with an APAP trial or by CPAP titration in lab.    Marius Ditch Sleep specialist, [place] [employer]  ELECTRONICALLY SIGNED ON:  02/27/2019, 7:22 AM [place] PH: [phone]   FX: [phone] [place]

## 2019-08-10 ENCOUNTER — Other Ambulatory Visit: Payer: Self-pay

## 2019-08-10 ENCOUNTER — Ambulatory Visit (HOSPITAL_COMMUNITY)
Admission: EM | Admit: 2019-08-10 | Discharge: 2019-08-10 | Disposition: A | Discharge status: Home / Self Care | Payer: Medicaid Other | Attending: Family Medicine | Admitting: Family Medicine

## 2019-08-10 DIAGNOSIS — T7840XA Allergy, unspecified, initial encounter: Secondary | ICD-10-CM

## 2019-08-10 MED ORDER — CETIRIZINE HCL 10 MG PO TABS: 10.0000 mg | ORAL_TABLET | Freq: Every day | ORAL | 0 refills | Status: DC

## 2019-08-10 NOTE — ED Triage Notes (Signed)
Pt states she has ear discomfort  x 2 days. (right ear )

## 2019-08-10 NOTE — Discharge Instructions (Signed)
I believe this is allergy related Zyrtec daily Follow up as needed for continued or worsening symptoms

## 2019-08-10 NOTE — ED Provider Notes (Signed)
[facility]    CSN: [id number] Arrival date & time: 08/10/19  0825      History   Chief Complaint Chief Complaint  Patient presents with  . Otalgia    HPI Robin Davis is a 39 y.o. female.   Patient is a 39 year old female presents today with bilateral ear pressure worse on the right.  She is also had some associated pressure behind eyes, left eye drainage.  She is also has some associated postnasal drip.  Symptoms have been constant for the past 2 days.  She does have a history of seasonal allergies.  Has not been taking any allergy medication.  No cough, chest congestion, shortness of breath, fever, chills, body aches, loss of taste or smell.  ROS per HPI      Past Medical History:  Diagnosis Date  . Anxiety   . Foot pain   . Hypertension   . Obesity     Patient Active Problem List   Diagnosis Date Noted  . Snoring 02/22/2019    Past Surgical History:  Procedure Laterality Date  . BREAST REDUCTION SURGERY    . CHOLECYSTECTOMY      OB History    Gravida  5   Para  3   Term  2   Preterm  1   AB  2   Living  2     SAB      TAB      Ectopic      Multiple      Live Births               Home Medications    Prior to Admission medications   Medication Sig Start Date End Date Taking? Authorizing Provider  albuterol (PROVENTIL HFA;VENTOLIN HFA) 108 (90 BASE) MCG/ACT inhaler Inhale 1-2 puffs into the lungs every 6 (six) hours as needed for wheezing or shortness of breath. 04/15/14   Elpidio Anis, PA-C  cetirizine (ZYRTEC ALLERGY) 10 MG tablet Take 1 tablet (10 mg total) by mouth daily. 08/10/19   Elainna Eshleman, Gloris Manchester A, NP  fluticasone (FLONASE) 50 MCG/ACT nasal spray Place 1 spray into both nostrils daily. 04/08/17   Audry Pili, PA-C  fluvoxaMINE (LUVOX) 50 MG tablet Take 50 mg by mouth daily. 10/15/15   [provider]  levonorgestrel (MIRENA) 20 MCG/24HR IUD 1 each by Intrauterine route once. Implanted October 2014     [provider]  losartan-hydrochlorothiazide (HYZAAR) 100-12.5 MG tablet Take 1 tablet by mouth daily. 10/29/15   [provider]  meloxicam (MOBIC) 7.5 MG tablet Take 7.5 mg by mouth daily.    [provider]  phenol (CHLORASEPTIC) 1.4 % LIQD Use as directed 1 spray in the mouth or throat as needed for throat irritation / pain. 05/13/18   Fawze, Mina A, PA-C  phentermine (ADIPEX-P) 37.5 MG tablet Take 37.5 mg by mouth daily. 03/13/14   [provider]  pseudoephedrine (SUDAFED 12 HOUR) 120 MG 12 hr tablet Take 1 tablet (120 mg total) by mouth 2 (two) times daily. 04/06/17   Wallis Bamberg, PA-C    Family History Family History  Problem Relation Age of Onset  . Hypertension Other   . Diabetes Other     Social History Social History   Tobacco Use  . Smoking status: Never Smoker  . Smokeless tobacco: Never Used  Substance Use Topics  . Alcohol use: Yes    Comment: seldom  . Drug use: No     Allergies  Fish allergy, Lisinopril, Macrobid [nitrofurantoin macrocrystal], Tramadol, Flagyl [metronidazole hcl], and Vicodin [hydrocodone-acetaminophen]   Review of Systems Review of Systems   Physical Exam Triage Vital Signs ED Triage Vitals  Enc Vitals Group     BP 08/10/19 0857 (!) 141/99     Pulse Rate 08/10/19 0857 91     Resp 08/10/19 0857 18     Temp 08/10/19 0857 98.1 F (36.7 C)     Temp Source 08/10/19 0857 Oral     SpO2 08/10/19 0857 98 %     Weight 08/10/19 0852 230 lb (104.3 kg)     Height --      Head Circumference --      Peak Flow --      Pain Score 08/10/19 0852 2     Pain Loc --      Pain Edu? --      Excl. in [place]? --    No data found.  Updated Vital Signs BP (!) 141/99 (BP Location: Right Arm)   Pulse 91   Temp 98.1 F (36.7 C) (Oral)   Resp 18   Wt 230 lb (104.3 kg)   LMP 08/02/2019   SpO2 98%   BMI 42.07 kg/m   Visual Acuity Right Eye Distance:   Left Eye Distance:   Bilateral Distance:    Right Eye Near:    Left Eye Near:    Bilateral Near:     Physical Exam Vitals and nursing note reviewed.  Constitutional:      General: She is not in acute distress.    Appearance: Normal appearance. She is not ill-appearing, toxic-appearing or diaphoretic.  HENT:     Head: Normocephalic.     Right Ear: Tympanic membrane and ear canal normal.     Ears:     Comments: Left middle ear effusion.     Nose: Nose normal.     Mouth/Throat:     Pharynx: Oropharynx is clear.     Comments: Postnasal drip Eyes:     Conjunctiva/sclera: Conjunctivae normal.     Comments: Mild left upper and lower lid swelling.  Sclera white  Cardiovascular:     Rate and Rhythm: Normal rate and regular rhythm.  Pulmonary:     Effort: Pulmonary effort is normal.     Breath sounds: Normal breath sounds.  Abdominal:     Palpations: Abdomen is soft.     Tenderness: There is no abdominal tenderness.  Musculoskeletal:        General: Normal range of motion.     Cervical back: Normal range of motion.  Skin:    General: Skin is warm and dry.     Findings: No rash.  Neurological:     Mental Status: She is alert.  Psychiatric:        Mood and Affect: Mood normal.      UC Treatments / Results  Labs (all labs ordered are listed, but only abnormal results are displayed) Labs Reviewed - No data to display  EKG   Radiology No results found.  Procedures Procedures (including critical care time)  Medications Ordered in UC Medications - No data to display  Initial Impression / Assessment and Plan / UC Course  I have reviewed the triage vital signs and the nursing notes.  Pertinent labs & imaging results that were available during my care of the patient were reviewed by me and considered in my medical decision making (see chart for details).     Allergies-most likely diagnosis  based on symptoms and exam.  Mild left middle ear effusion.  We will treat this with Zyrtec daily. No concern for ear infection at this  time Follow up as needed for continued or worsening symptoms  Final Clinical Impressions(s) / UC Diagnoses   Final diagnoses:  Allergy, initial encounter     Discharge Instructions     I believe this is allergy related Zyrtec daily Follow up as needed for continued or worsening symptoms     ED Prescriptions    Medication Sig Dispense Auth. Provider   cetirizine (ZYRTEC ALLERGY) 10 MG tablet  (Status: Discontinued) Take 1 tablet (10 mg total) by mouth daily. 30 tablet Kamariyah Timberlake A, NP   cetirizine (ZYRTEC ALLERGY) 10 MG tablet Take 1 tablet (10 mg total) by mouth daily. 30 tablet Dahlia Byes A, NP     PDMP not reviewed this encounter.   Janace Aris, NP 08/10/19 [phone]

## 2020-01-16 ENCOUNTER — Ambulatory Visit (HOSPITAL_COMMUNITY)
Admission: EM | Admit: 2020-01-16 | Discharge: 2020-01-16 | Disposition: A | Discharge status: Home / Self Care | Payer: Medicaid Other | Attending: Physician Assistant | Admitting: Physician Assistant

## 2020-01-16 ENCOUNTER — Encounter (HOSPITAL_COMMUNITY): Payer: Self-pay

## 2020-01-16 ENCOUNTER — Other Ambulatory Visit: Payer: Self-pay

## 2020-01-16 DIAGNOSIS — U071 COVID-19: Secondary | ICD-10-CM | POA: Insufficient documentation

## 2020-01-16 DIAGNOSIS — J3089 Other allergic rhinitis: Secondary | ICD-10-CM | POA: Diagnosis present

## 2020-01-16 MED ORDER — CETIRIZINE HCL 10 MG PO TABS: 10.0000 mg | ORAL_TABLET | Freq: Every day | ORAL | 1 refills | Status: DC

## 2020-01-16 MED ORDER — FLUTICASONE PROPIONATE 50 MCG/ACT NA SUSP: 1.0000 | Freq: Two times a day (BID) | NASAL | 1 refills | Status: DC

## 2020-01-16 NOTE — ED Triage Notes (Signed)
Pt presents for COVD testing. State shaving scratchy throat, cough, watery eyes and itchy ears since this morning. Denies fever, chills, SOB.

## 2020-01-16 NOTE — ED Provider Notes (Signed)
[facility]    CSN: [id number] Arrival date & time: 01/16/20  1636      History   Chief Complaint Chief Complaint  Patient presents with  . Cough  . Eye Problem    HPI Robin Davis is a 39 y.o. female.   Scratchy throat, itchy watering eyes, cough, malaise x 1 day. Rode horses over the weekend and notes this tends to irritate her allergies. Denies fever, chills, N/V/D, CP, SOB, wheezes. Hx of allergies, does not take the medications regularly.      Past Medical History:  Diagnosis Date  . Anxiety   . Foot pain   . Hypertension   . Obesity     Patient Active Problem List   Diagnosis Date Noted  . Snoring 02/22/2019    Past Surgical History:  Procedure Laterality Date  . BREAST REDUCTION SURGERY    . CHOLECYSTECTOMY      OB History    Gravida  5   Para  3   Term  2   Preterm  1   AB  2   Living  2     SAB      TAB      Ectopic      Multiple      Live Births               Home Medications    Prior to Admission medications   Medication Sig Start Date End Date Taking? Authorizing Provider  albuterol (PROVENTIL HFA;VENTOLIN HFA) 108 (90 BASE) MCG/ACT inhaler Inhale 1-2 puffs into the lungs every 6 (six) hours as needed for wheezing or shortness of breath. 04/15/14   Elpidio Anis, PA-C  amLODipine (NORVASC) 10 MG tablet Take 10 mg by mouth daily. 12/28/19   [provider]  cetirizine (ZYRTEC ALLERGY) 10 MG tablet Take 1 tablet (10 mg total) by mouth daily. 01/16/20   Particia Nearing, PA-C  fluticasone [facility]) 50 MCG/ACT nasal spray Place 1 spray into both nostrils 2 (two) times daily. 01/16/20   Particia Nearing, PA-C  fluvoxaMINE (LUVOX) 50 MG tablet Take 50 mg by mouth daily. 10/15/15   [provider]  levonorgestrel (MIRENA) 20 MCG/24HR IUD 1 each by Intrauterine route once. Implanted October 2014    [provider]  losartan-hydrochlorothiazide (HYZAAR) 100-12.5 MG tablet Take 1  tablet by mouth daily. 10/29/15   [provider]  meloxicam (MOBIC) 7.5 MG tablet Take 7.5 mg by mouth daily.    [provider]  phenol (CHLORASEPTIC) 1.4 % LIQD Use as directed 1 spray in the mouth or throat as needed for throat irritation / pain. 05/13/18   Fawze, Mina A, PA-C  phentermine (ADIPEX-P) 37.5 MG tablet Take 37.5 mg by mouth daily. 03/13/14   [provider]  pseudoephedrine (SUDAFED 12 HOUR) 120 MG 12 hr tablet Take 1 tablet (120 mg total) by mouth 2 (two) times daily. 04/06/17   Wallis Bamberg, PA-C    Family History Family History  Problem Relation Age of Onset  . Hypertension Other   . Diabetes Other     Social History Social History   Tobacco Use  . Smoking status: Never Smoker  . Smokeless tobacco: Never Used  Substance Use Topics  . Alcohol use: Yes    Comment: seldom  . Drug use: No     Allergies   Fish allergy, Lisinopril, Nitrofurantoin macrocrystal, Tramadol, Flagyl [metronidazole hcl], Hydrocodone-acetaminophen, and Vicodin [hydrocodone-acetaminophen]   Review of Systems Review of  Systems PER HPI   Physical Exam Triage Vital Signs ED Triage Vitals  Enc Vitals Group     BP 01/16/20 1751 (!) 142/88     Pulse Rate 01/16/20 1751 (!) 103     Resp 01/16/20 1751 18     Temp 01/16/20 1751 99.4 F (37.4 C)     Temp Source 01/16/20 1751 Oral     SpO2 01/16/20 1751 97 %     Weight --      Height --      Head Circumference --      Peak Flow --      Pain Score 01/16/20 1746 0     Pain Loc --      Pain Edu? --      Excl. in GC? --    No data found.  Updated Vital Signs BP (!) 142/88 (BP Location: Left Arm)   Pulse (!) 103   Temp 99.4 F (37.4 C) (Oral)   Resp 18   SpO2 97%   Visual Acuity Right Eye Distance:   Left Eye Distance:   Bilateral Distance:    Right Eye Near:   Left Eye Near:    Bilateral Near:     Physical Exam Vitals and nursing note reviewed.  Constitutional:      Appearance: Normal  appearance. She is not ill-appearing.  HENT:     Head: Atraumatic.     Right Ear: Tympanic membrane normal.     Left Ear: Tympanic membrane normal.     Nose: Rhinorrhea present.     Comments: Nasal mucosa erythematous and boggy    Mouth/Throat:     Mouth: Mucous membranes are moist.     Pharynx: Posterior oropharyngeal erythema present.  Eyes:     Extraocular Movements: Extraocular movements intact.     Conjunctiva/sclera: Conjunctivae normal.  Cardiovascular:     Rate and Rhythm: Normal rate and regular rhythm.     Heart sounds: Normal heart sounds.  Pulmonary:     Effort: Pulmonary effort is normal.     Breath sounds: Normal breath sounds.  Abdominal:     General: Bowel sounds are normal. There is no distension.     Palpations: Abdomen is soft.     Tenderness: There is no abdominal tenderness. There is no guarding.  Musculoskeletal:        General: Normal range of motion.     Cervical back: Normal range of motion and neck supple.  Skin:    General: Skin is warm and dry.  Neurological:     Mental Status: She is alert and oriented to person, place, and time.  Psychiatric:        Mood and Affect: Mood normal.        Thought Content: Thought content normal.        Judgment: Judgment normal.      UC Treatments / Results  Labs (all labs ordered are listed, but only abnormal results are displayed) Labs Reviewed  SARS CORONAVIRUS 2 (TAT 6-24 HRS)    EKG   Radiology No results found.  Procedures Procedures (including critical care time)  Medications Ordered in UC Medications - No data to display  Initial Impression / Assessment and Plan / UC Course  I have reviewed the triage vital signs and the nursing notes.  Pertinent labs & imaging results that were available during my care of the patient were reviewed by me and considered in my medical decision making (see chart for details).  Sxs consistent with exacerbation of seasonal allergies, but will r/o COVID.  Isolate until results return, restart zyrtec and flonase regimen and montior closely for benefit. Discussed strict return precautions if sxs worsen or fail to improve.   Final Clinical Impressions(s) / UC Diagnoses   Final diagnoses:  Seasonal allergic rhinitis due to other allergic trigger     Discharge Instructions     Isolate at home until COVID 19 results come back. Start back up on allergy regimen, follow up with primary care provider if not improving    ED Prescriptions    Medication Sig Dispense Auth. Provider   cetirizine (ZYRTEC ALLERGY) 10 MG tablet Take 1 tablet (10 mg total) by mouth daily. 30 tablet Particia Nearing, PA-C   fluticasone [facility]) 50 MCG/ACT nasal spray Place 1 spray into both nostrils 2 (two) times daily. 16 g Particia Nearing, [place]     PDMP not reviewed this encounter.   Particia Nearing, [place] 01/16/20 1930

## 2020-01-16 NOTE — Discharge Instructions (Signed)
Isolate at home until COVID 19 results come back. Start back up on allergy regimen, follow up with primary care provider if not improving

## 2020-01-17 LAB — SARS CORONAVIRUS 2 (TAT 6-24 HRS): SARS Coronavirus 2: POSITIVE — AB

## 2020-01-18 ENCOUNTER — Telehealth: Payer: Self-pay | Admitting: Family

## 2020-01-18 NOTE — Telephone Encounter (Signed)
Called to Discuss with patient about Covid symptoms and the use of the monoclonal antibody infusion for those with mild to moderate Covid symptoms and at a high risk of hospitalization.     Pt appears to qualify for this infusion due to co-morbid conditions and/or a member of an at-risk group in accordance with the FDA Emergency Use Authorization.   Ms. Cabal was recently seen at [facility] with watery eyes, cough and malaise. Risk factors include BMI >25 and hypertension. Discussed the risks and benefits of treatment with Regeneron. She would like to think about it. Information sent to MyChart.   Marcos Eke, NP 01/18/2020 11:09 AM

## 2020-01-19 ENCOUNTER — Encounter (HOSPITAL_COMMUNITY): Payer: Self-pay | Admitting: Emergency Medicine

## 2020-01-19 ENCOUNTER — Emergency Department (HOSPITAL_COMMUNITY)
Admission: EM | Admit: 2020-01-19 | Discharge: 2020-01-19 | Disposition: A | Discharge status: Home / Self Care | Payer: Medicaid Other | Attending: Emergency Medicine | Admitting: Emergency Medicine

## 2020-01-19 ENCOUNTER — Emergency Department (HOSPITAL_COMMUNITY): Payer: Medicaid Other

## 2020-01-19 ENCOUNTER — Other Ambulatory Visit: Payer: Self-pay

## 2020-01-19 ENCOUNTER — Telehealth: Payer: Self-pay | Admitting: Infectious Diseases

## 2020-01-19 DIAGNOSIS — I10 Essential (primary) hypertension: Secondary | ICD-10-CM | POA: Insufficient documentation

## 2020-01-19 DIAGNOSIS — Z79899 Other long term (current) drug therapy: Secondary | ICD-10-CM | POA: Diagnosis not present

## 2020-01-19 DIAGNOSIS — U071 COVID-19: Secondary | ICD-10-CM | POA: Insufficient documentation

## 2020-01-19 DIAGNOSIS — M791 Myalgia, unspecified site: Secondary | ICD-10-CM | POA: Diagnosis present

## 2020-01-19 DIAGNOSIS — Z8616 Personal history of COVID-19: Secondary | ICD-10-CM | POA: Insufficient documentation

## 2020-01-19 HISTORY — DX: COVID-19: U07.1

## 2020-01-19 IMAGING — DX DG CHEST 1V PORT
1 series · 1 of 1 positions shown · non-contrast
Comparison: 10/12/2018.

CLINICAL DATA: COVID.

EXAM:
PORTABLE CHEST 1 VIEW

[chest]
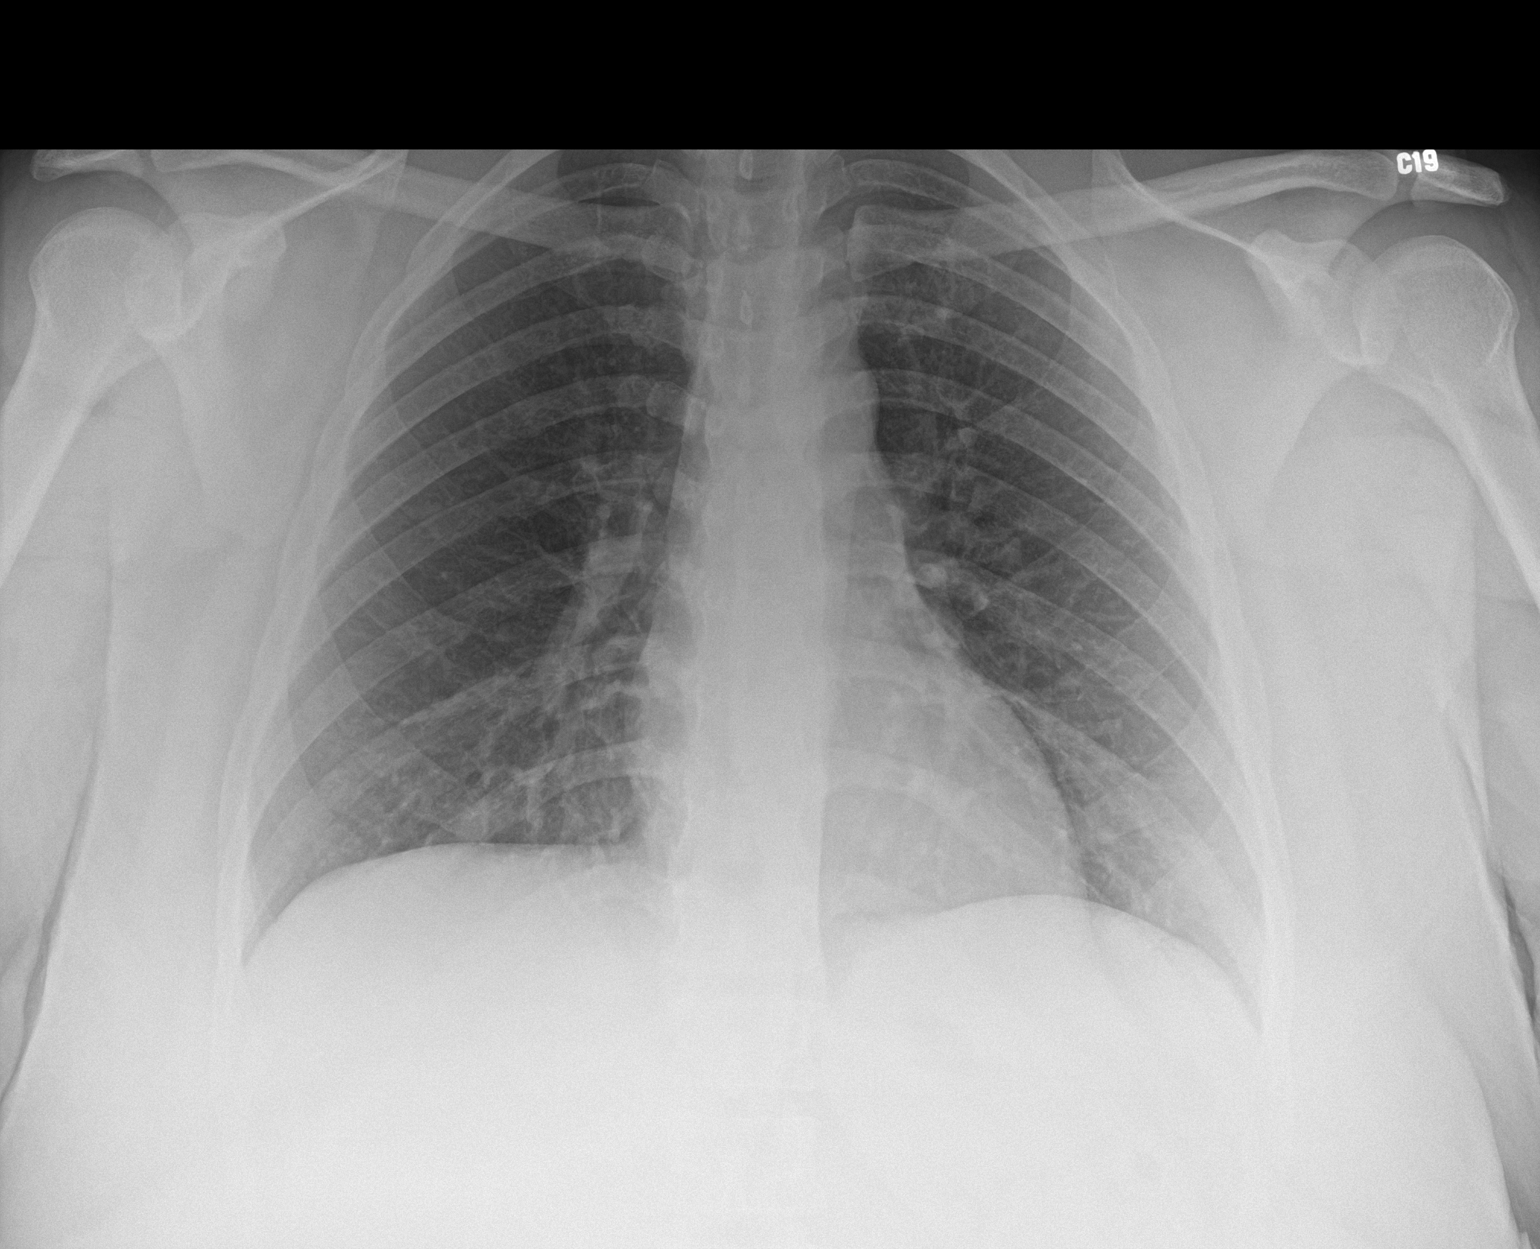

[1 of 1 positions shown; findings below may reference images not displayed]

FINDINGS: Mediastinum and hilar structures normal. Heart size normal. Mild
bibasilar subsegmental atelectasis. No pleural effusion or
pneumothorax.
IMPRESSION: Mild bibasilar subsegmental atelectasis, otherwise negative exam.

## 2020-01-19 MED ORDER — ACETAMINOPHEN 500 MG PO TABS
1000.0000 mg | ORAL_TABLET | Freq: Once | ORAL | Status: AC
  Administered 2020-01-19: 1000 mg via ORAL
  Filled 2020-01-19: qty 2

## 2020-01-19 NOTE — ED Notes (Signed)
Pt states that she has generalized aches all over her body, nothing specific. Heart and Lungs sound good. Pt stated that she has been hydrating a lot and feels that is helping.

## 2020-01-19 NOTE — ED Provider Notes (Signed)
[facility] EMERGENCY DEPARTMENT Provider Note   CSN: [id number] Arrival date & time: 01/19/20  6222     History Chief Complaint  Patient presents with  . Covid Positive  . Weakness    Robin Davis is a 39 y.o. female.  HPI Patient has a positive for Covid 4 days ago.  She reports that she is worried about getting worse.  She has been drinking fluids.  She is drinking electrolyte solution and eating grapes.  She reports she started getting a little bit of diarrhea, she is not sure if it is due to Covid because she is eating so many grapes.  No vomiting.  No headache.  Some generalized body ache.  Patient is taking Tylenol regularly.  Patient denies any shortness of breath.  She reports she coughs once about every 30 minutes but does not seem to be a lot at this point time.  No lower extremity swelling or calf pain.  Patient reports that she was contacted for monoclonal antibody infusion but she has not done it yet because she is worried about possible side effects.  She reports her daughter did it and she got short of breath when it got administered.    Past Medical History:  Diagnosis Date  . Anxiety   . COVID-19   . Foot pain   . Hypertension   . Obesity     Patient Active Problem List   Diagnosis Date Noted  . Snoring 02/22/2019    Past Surgical History:  Procedure Laterality Date  . BREAST REDUCTION SURGERY    . CHOLECYSTECTOMY       OB History    Gravida  5   Para  3   Term  2   Preterm  1   AB  2   Living  2     SAB      TAB      Ectopic      Multiple      Live Births              Family History  Problem Relation Age of Onset  . Hypertension Other   . Diabetes Other     Social History   Tobacco Use  . Smoking status: Never Smoker  . Smokeless tobacco: Never Used  Substance Use Topics  . Alcohol use: Yes    Comment: seldom  . Drug use: No    Home Medications Prior to Admission medications   Medication  Sig Start Date End Date Taking? Authorizing Provider  albuterol (PROVENTIL HFA;VENTOLIN HFA) 108 (90 BASE) MCG/ACT inhaler Inhale 1-2 puffs into the lungs every 6 (six) hours as needed for wheezing or shortness of breath. 04/15/14   Elpidio Anis, PA-C  amLODipine (NORVASC) 10 MG tablet Take 10 mg by mouth daily. 12/28/19   [provider]  cetirizine (ZYRTEC ALLERGY) 10 MG tablet Take 1 tablet (10 mg total) by mouth daily. 01/16/20   Particia Nearing, PA-C  fluticasone [facility]) 50 MCG/ACT nasal spray Place 1 spray into both nostrils 2 (two) times daily. 01/16/20   Particia Nearing, PA-C  fluvoxaMINE (LUVOX) 50 MG tablet Take 50 mg by mouth daily. 10/15/15   [provider]  levonorgestrel (MIRENA) 20 MCG/24HR IUD 1 each by Intrauterine route once. Implanted October 2014    [provider]  losartan-hydrochlorothiazide (HYZAAR) 100-12.5 MG tablet Take 1 tablet by mouth daily. 10/29/15   [provider]  meloxicam (MOBIC) 7.5 MG tablet Take 7.5  mg by mouth daily.    [provider]  phenol (CHLORASEPTIC) 1.4 % LIQD Use as directed 1 spray in the mouth or throat as needed for throat irritation / pain. 05/13/18   Fawze, Mina A, PA-C  phentermine (ADIPEX-P) 37.5 MG tablet Take 37.5 mg by mouth daily. 03/13/14   [provider]  pseudoephedrine (SUDAFED 12 HOUR) 120 MG 12 hr tablet Take 1 tablet (120 mg total) by mouth 2 (two) times daily. 04/06/17   Wallis Bamberg, PA-C    Allergies    Fish allergy, Lisinopril, Nitrofurantoin macrocrystal, Tramadol, Flagyl [metronidazole hcl], Hydrocodone-acetaminophen, and Vicodin [hydrocodone-acetaminophen]  Review of Systems   Review of Systems 10 systems reviewed and negative except as per HPI Physical Exam Updated Vital Signs BP 112/77 (BP Location: Right Arm)   Pulse (!) 108   Temp 99.6 F (37.6 C) (Oral)   Resp 19   Ht 5\' 6"  (1.676 m)   Wt 99.3 kg   SpO2 97%   BMI 35.35 kg/m   Physical  Exam Constitutional:      Appearance: She is well-developed.  HENT:     Head: Normocephalic and atraumatic.  Eyes:     Conjunctiva/sclera: Conjunctivae normal.  Cardiovascular:     Rate and Rhythm: Normal rate and regular rhythm.     Heart sounds: Normal heart sounds.  Pulmonary:     Effort: Pulmonary effort is normal.     Breath sounds: Normal breath sounds.  Abdominal:     General: Bowel sounds are normal. There is no distension.     Palpations: Abdomen is soft.     Tenderness: There is no abdominal tenderness.  Musculoskeletal:        General: No swelling or tenderness. Normal range of motion.     Cervical back: Neck supple.  Skin:    General: Skin is warm and dry.  Neurological:     Mental Status: She is alert and oriented to person, place, and time.     GCS: GCS eye subscore is 4. GCS verbal subscore is 5. GCS motor subscore is 6.     Coordination: Coordination normal.  Psychiatric:        Mood and Affect: Mood normal.     ED Results / Procedures / Treatments   Labs (all labs ordered are listed, but only abnormal results are displayed) Labs Reviewed  BASIC METABOLIC PANEL  CBC  URINALYSIS, ROUTINE W REFLEX MICROSCOPIC  I-STAT BETA HCG BLOOD, ED ([facility], [facility], [facility] ONLY)    EKG EKG Interpretation  Date/Time:  Thursday January 19 2020 07:07:48 EDT Ventricular Rate:  111 PR Interval:  146 QRS Duration: 80 QT Interval:  326 QTC Calculation: 443 R Axis:   40 Text Interpretation: Sinus tachycardia Otherwise normal ECG agree, no change Confirmed by [id number] [phone]) on 01/19/2020 8:35:48 AM   Radiology DG Chest Portable 1 View  Result Date: 01/19/2020 CLINICAL DATA:  COVID. EXAM: PORTABLE CHEST 1 VIEW COMPARISON:  10/12/2018. FINDINGS: Mediastinum and hilar structures normal. Heart size normal. Mild bibasilar subsegmental atelectasis. No pleural effusion or pneumothorax. IMPRESSION: Mild bibasilar subsegmental atelectasis, otherwise negative exam. Electronically  Signed   By: 12/12/2018  Register   On: 01/19/2020 07:35    Procedures Procedures (including critical care time)  Medications Ordered in ED Medications  acetaminophen (TYLENOL) tablet 1,000 mg (has no administration in time range)    ED Course  I have reviewed the triage vital signs and the nursing notes.  Pertinent labs & imaging results that were  available during my care of the patient were reviewed by me and considered in my medical decision making (see chart for details).    MDM Rules/Calculators/A&P                         Patient is alert and nontoxic.  No respiratory distress.  Chest x-ray does not show any pneumonia and there is no hypoxia.  Patient is worried about having Covid.  She is worried about being adequately hydrated.  She is also worried about taking the monoclonal antibody infusion due to risk of side effect.  Patient is encouraged to contact monoclonal antibody clinic and discuss her concerns of side effects.  She is also counseled that with risk for worsening disease, her risk benefit is in favor of taking monoclonal antibodies.  This time, no need for admission.  Patient is not hypoxic, no respiratory distress, breath sounds clear, well hydrated.  ZEPHYR SAUSEDO was evaluated in Emergency Department on 01/19/2020 for the symptoms described in the history of present illness. She was evaluated in the context of the global COVID-19 pandemic, which necessitated consideration that the patient might be at risk for infection with the SARS-CoV-2 virus that causes COVID-19. Institutional protocols and algorithms that pertain to the evaluation of patients at risk for COVID-19 are in a state of rapid change based on information released by regulatory bodies including the CDC and federal and state organizations. These policies and algorithms were followed during the patient's care in the ED. Final Clinical Impression(s) / ED Diagnoses Final diagnoses:  COVID-19    Rx / DC  Orders ED Discharge Orders    None       Arby Barrette, MD 01/19/20 [id number]

## 2020-01-19 NOTE — Discharge Instructions (Addendum)
1.  Continue to take Tylenol for body aches and fever.  Continue to hydrate as you have been doing. 2.  Call the monoclonal antibody clinic to discuss your concerns regarding getting treatment.  This is highly recommended to avoid getting a worsening case of Covid pneumonia.  Call today so you can get scheduled as soon as possible. 3.  Return to the emergency department if you get worsening shortness of breath or vomiting and cannot stay hydrated. 4.  Follow all precautions for isolating herself from infecting others.

## 2020-01-19 NOTE — ED Triage Notes (Addendum)
Pt states she tested + for COVID on Monday.  Reports severe pain to bilateral eyes, pain to upper back when she puts her chin to chest, and generalized weakness.  Denies SOB.  Pt concerned she may have pneumonia.

## 2020-01-19 NOTE — Telephone Encounter (Signed)
I called Ms. Robin Davis back and LVM with my work cell asking for a callback.  She is hesitant and has some questions about monoclonal therapy for COVID

## 2020-01-21 ENCOUNTER — Emergency Department (HOSPITAL_COMMUNITY): Payer: Medicaid Other

## 2020-01-21 ENCOUNTER — Other Ambulatory Visit: Payer: Self-pay

## 2020-01-21 ENCOUNTER — Emergency Department (HOSPITAL_COMMUNITY)
Admission: EM | Admit: 2020-01-21 | Discharge: 2020-01-22 | Disposition: A | Discharge status: Home / Self Care | Payer: Medicaid Other | Attending: Emergency Medicine | Admitting: Emergency Medicine

## 2020-01-21 ENCOUNTER — Encounter (HOSPITAL_COMMUNITY): Payer: Self-pay

## 2020-01-21 DIAGNOSIS — Z79899 Other long term (current) drug therapy: Secondary | ICD-10-CM | POA: Insufficient documentation

## 2020-01-21 DIAGNOSIS — I10 Essential (primary) hypertension: Secondary | ICD-10-CM | POA: Insufficient documentation

## 2020-01-21 DIAGNOSIS — E669 Obesity, unspecified: Secondary | ICD-10-CM | POA: Insufficient documentation

## 2020-01-21 DIAGNOSIS — R000 Tachycardia, unspecified: Secondary | ICD-10-CM | POA: Diagnosis not present

## 2020-01-21 DIAGNOSIS — U071 COVID-19: Secondary | ICD-10-CM | POA: Diagnosis present

## 2020-01-21 LAB — BASIC METABOLIC PANEL
Anion gap: 11 (ref 5–15)
BUN: 5 mg/dL — ABNORMAL LOW (ref 6–20)
CO2: 28 mmol/L (ref 22–32)
Calcium: 9.2 mg/dL (ref 8.9–10.3)
Chloride: 95 mmol/L — ABNORMAL LOW (ref 98–111)
Creatinine, Ser: 0.8 mg/dL (ref 0.44–1.00)
GFR calc Af Amer: >60 mL/min (ref >60)
GFR calc non Af Amer: >60 mL/min (ref >60)
Glucose, Bld: 140 mg/dL — ABNORMAL HIGH (ref 70–99)
Potassium: 3.2 mmol/L — ABNORMAL LOW (ref 3.5–5.1)
Sodium: 134 mmol/L — ABNORMAL LOW (ref 135–145)

## 2020-01-21 LAB — CBC
HCT: 42.3 % (ref 36.0–46.0)
Hemoglobin: 14.1 g/dL (ref 12.0–15.0)
MCH: 27.9 pg (ref 26.0–34.0)
MCHC: 33.3 g/dL (ref 30.0–36.0)
MCV: 83.6 fL (ref 80.0–100.0)
Platelets: 283 10*3/uL (ref 150–400)
RBC: 5.06 MIL/uL (ref 3.87–5.11)
RDW: 13.4 % (ref 11.5–15.5)
WBC: 3.8 10*3/uL — ABNORMAL LOW (ref 4.0–10.5)
nRBC: 0 % (ref 0.0–0.2)

## 2020-01-21 IMAGING — CR DG CHEST 1V PORT
1 series · 1 of 1 positions shown · non-contrast
Comparison: 01/19/2020 and earlier, including CTA chest 05/20/2018.

CLINICAL DATA: 38-year-old who is [id number] positive, presenting
with worsening shortness of breath on exertion and cough.

EXAM:
PORTABLE CHEST 1 VIEW

[AP]
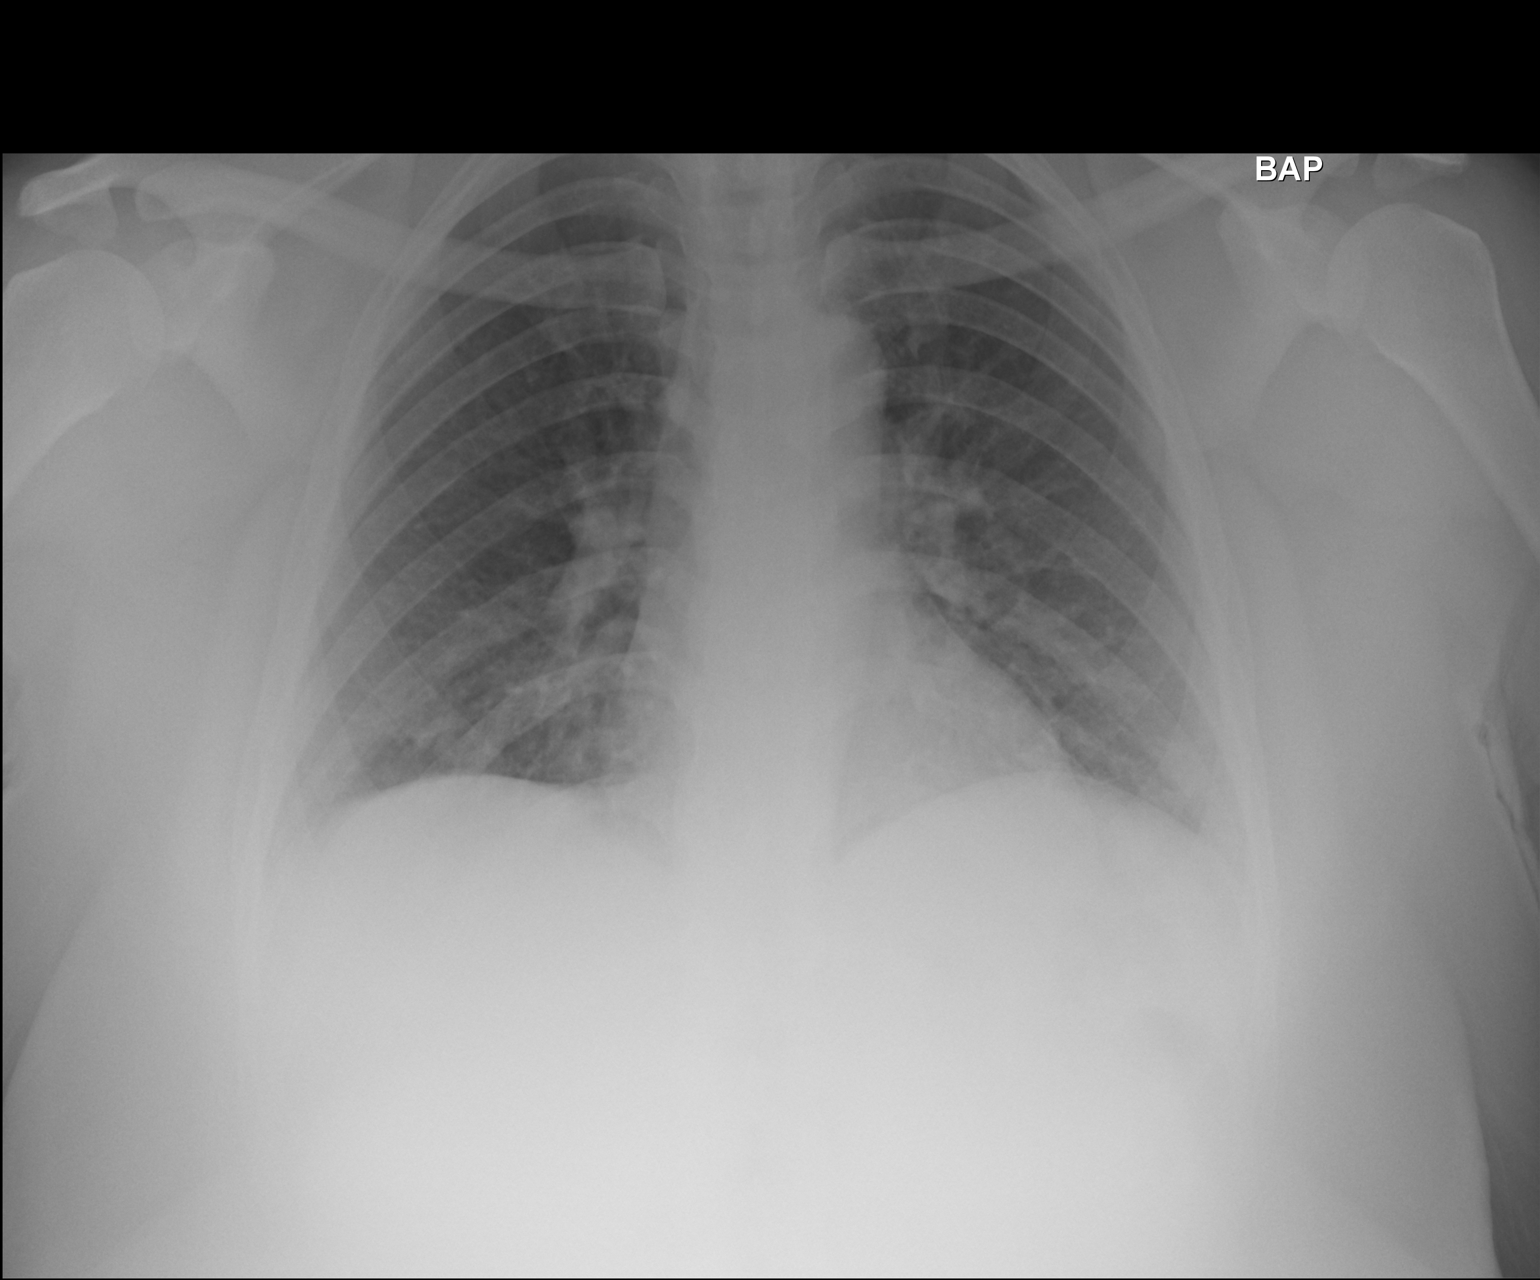

[1 of 1 positions shown; findings below may reference images not displayed]

FINDINGS: Suboptimal inspiration accounts for crowded bronchovascular
markings, especially in the bases, and accentuates the cardiac
silhouette. Taking this into account, cardiomediastinal silhouette
unremarkable and unchanged. Lungs clear. Bronchovascular markings
normal. Pulmonary vascularity normal. No visible pleural effusions.
No pneumothorax.
IMPRESSION: Suboptimal inspiration. No acute cardiopulmonary disease.

## 2020-01-21 NOTE — ED Triage Notes (Signed)
Pt tested covid + on Monday, now having increased SOB with walking and cough.

## 2020-01-22 ENCOUNTER — Other Ambulatory Visit: Payer: Self-pay

## 2020-01-22 ENCOUNTER — Emergency Department (HOSPITAL_COMMUNITY): Payer: Medicaid Other

## 2020-01-22 ENCOUNTER — Encounter (HOSPITAL_COMMUNITY): Payer: Self-pay | Admitting: Emergency Medicine

## 2020-01-22 ENCOUNTER — Emergency Department (HOSPITAL_COMMUNITY)
Admission: EM | Admit: 2020-01-22 | Discharge: 2020-01-23 | Disposition: A | Discharge status: Home / Self Care | Payer: Medicaid Other | Source: Home / Self Care | Attending: Emergency Medicine | Admitting: Emergency Medicine

## 2020-01-22 DIAGNOSIS — I10 Essential (primary) hypertension: Secondary | ICD-10-CM | POA: Insufficient documentation

## 2020-01-22 DIAGNOSIS — U071 COVID-19: Secondary | ICD-10-CM

## 2020-01-22 DIAGNOSIS — R000 Tachycardia, unspecified: Secondary | ICD-10-CM | POA: Insufficient documentation

## 2020-01-22 DIAGNOSIS — R531 Weakness: Secondary | ICD-10-CM | POA: Insufficient documentation

## 2020-01-22 DIAGNOSIS — Z79899 Other long term (current) drug therapy: Secondary | ICD-10-CM | POA: Insufficient documentation

## 2020-01-22 LAB — COMPREHENSIVE METABOLIC PANEL
ALT: 34 U/L (ref 0–44)
AST: 33 U/L (ref 15–41)
Albumin: 4 g/dL (ref 3.5–5.0)
Alkaline Phosphatase: 64 U/L (ref 38–126)
Anion gap: 12 (ref 5–15)
BUN: 7 mg/dL (ref 6–20)
CO2: 28 mmol/L (ref 22–32)
Calcium: 9 mg/dL (ref 8.9–10.3)
Chloride: 94 mmol/L — ABNORMAL LOW (ref 98–111)
Creatinine, Ser: 0.74 mg/dL (ref 0.44–1.00)
GFR calc Af Amer: >60 mL/min (ref >60)
GFR calc non Af Amer: >60 mL/min (ref >60)
Glucose, Bld: 159 mg/dL — ABNORMAL HIGH (ref 70–99)
Potassium: 3.3 mmol/L — ABNORMAL LOW (ref 3.5–5.1)
Sodium: 134 mmol/L — ABNORMAL LOW (ref 135–145)
Total Bilirubin: 0.3 mg/dL (ref 0.3–1.2)
Total Protein: 7.5 g/dL (ref 6.5–8.1)

## 2020-01-22 LAB — CBC WITH DIFFERENTIAL/PLATELET
Abs Immature Granulocytes: 0.04 10*3/uL (ref 0.00–0.07)
Basophils Absolute: 0 10*3/uL (ref 0.0–0.1)
Basophils Relative: 0 %
Eosinophils Absolute: 0 10*3/uL (ref 0.0–0.5)
Eosinophils Relative: 0 %
HCT: 42.5 % (ref 36.0–46.0)
Hemoglobin: 14.2 g/dL (ref 12.0–15.0)
Immature Granulocytes: 1 %
Lymphocytes Relative: 26 %
Lymphs Abs: 1.3 10*3/uL (ref 0.7–4.0)
MCH: 27.6 pg (ref 26.0–34.0)
MCHC: 33.4 g/dL (ref 30.0–36.0)
MCV: 82.5 fL (ref 80.0–100.0)
Monocytes Absolute: 0.7 10*3/uL (ref 0.1–1.0)
Monocytes Relative: 15 %
Neutro Abs: 2.7 10*3/uL (ref 1.7–7.7)
Neutrophils Relative %: 58 %
Platelets: 253 10*3/uL (ref 150–400)
RBC: 5.15 MIL/uL — ABNORMAL HIGH (ref 3.87–5.11)
RDW: 13.2 % (ref 11.5–15.5)
WBC: 4.8 10*3/uL (ref 4.0–10.5)
nRBC: 0 % (ref 0.0–0.2)

## 2020-01-22 LAB — I-STAT BETA HCG BLOOD, ED (MC, WL, AP ONLY): I-stat hCG, quantitative: <5 m[IU]/mL (ref <5)

## 2020-01-22 LAB — LACTIC ACID, PLASMA: Lactic Acid, Venous: 1.7 mmol/L (ref 0.5–1.9)

## 2020-01-22 IMAGING — DX DG CHEST 1V PORT
1 series · 1 of 1 positions shown · non-contrast
Comparison: 01/21/2020

CLINICAL DATA: Shortness of breath

EXAM:
PORTABLE CHEST 1 VIEW

[chest ap]
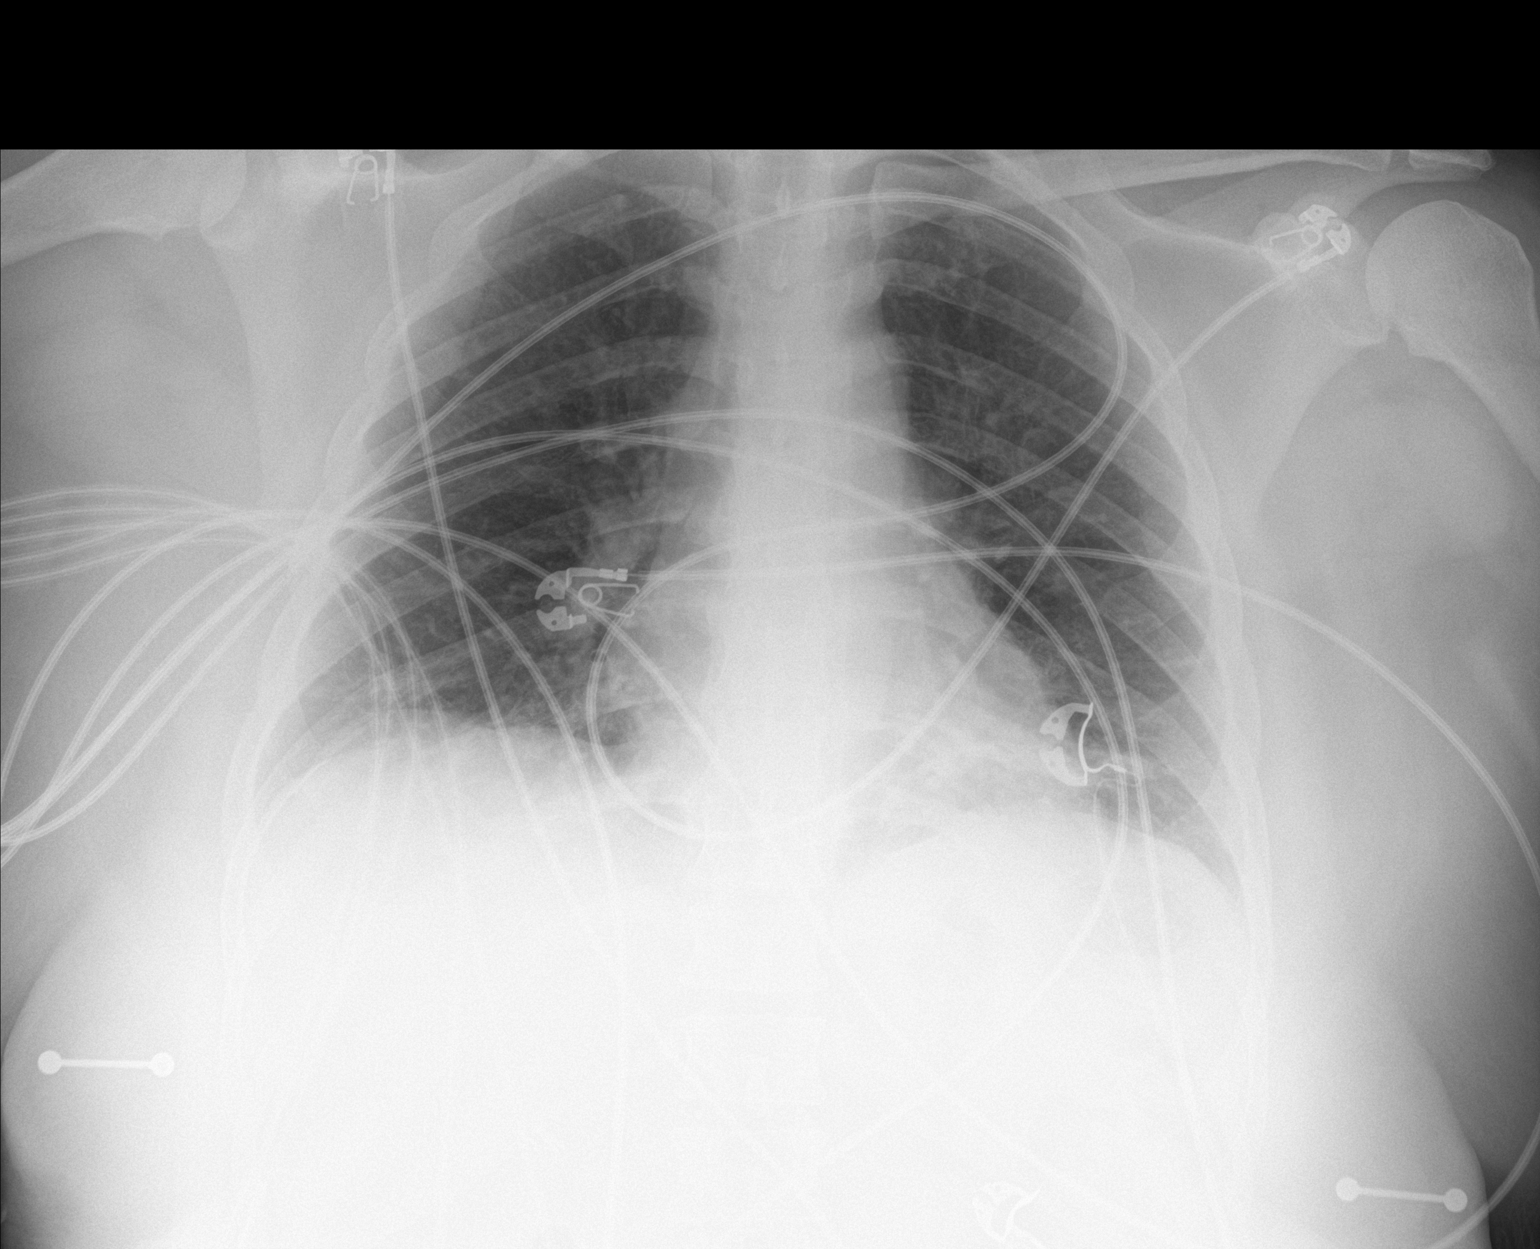

[1 of 1 positions shown; findings below may reference images not displayed]

FINDINGS: There is a new airspace opacity at the left lung base. The heart
size is unremarkable. There is no pneumothorax. No large pleural
effusion. No acute osseous abnormality.
IMPRESSION: New airspace opacity at the left lung base compatible with
pneumonia.

## 2020-01-22 MED ORDER — EPINEPHRINE 0.3 MG/0.3ML IJ SOAJ: 0.3000 mg | Freq: Once | INTRAMUSCULAR | Status: DC | PRN

## 2020-01-22 MED ORDER — ALBUTEROL SULFATE HFA 108 (90 BASE) MCG/ACT IN AERS: 2.0000 | INHALATION_SPRAY | Freq: Once | RESPIRATORY_TRACT | Status: DC | PRN

## 2020-01-22 MED ORDER — BUDESONIDE 180 MCG/ACT IN AEPB: 1.0000 | INHALATION_SPRAY | Freq: Two times a day (BID) | RESPIRATORY_TRACT | 0 refills | Status: DC

## 2020-01-22 MED ORDER — AMOXICILLIN-POT CLAVULANATE 875-125 MG PO TABS: 1.0000 | ORAL_TABLET | Freq: Two times a day (BID) | ORAL | 0 refills | Status: DC

## 2020-01-22 MED ORDER — SODIUM CHLORIDE 0.9 % IV SOLN: INTRAVENOUS | Status: DC | PRN

## 2020-01-22 MED ORDER — AZITHROMYCIN 250 MG PO TABS: 250.0000 mg | ORAL_TABLET | Freq: Every day | ORAL | 0 refills | Status: DC

## 2020-01-22 MED ORDER — DIPHENHYDRAMINE HCL 50 MG/ML IJ SOLN: 50.0000 mg | Freq: Once | INTRAMUSCULAR | Status: DC | PRN

## 2020-01-22 MED ORDER — METHYLPREDNISOLONE SODIUM SUCC 125 MG IJ SOLR: 125.0000 mg | Freq: Once | INTRAMUSCULAR | Status: DC | PRN

## 2020-01-22 MED ORDER — SODIUM CHLORIDE 0.9 % IV SOLN
1200.0000 mg | Freq: Once | INTRAVENOUS | Status: AC
  Administered 2020-01-22: 1200 mg via INTRAVENOUS
  Filled 2020-01-22: qty 10

## 2020-01-22 MED ORDER — ACETAMINOPHEN 325 MG PO TABS
650.0000 mg | ORAL_TABLET | Freq: Once | ORAL | Status: AC
  Administered 2020-01-22: 650 mg via ORAL
  Filled 2020-01-22: qty 2

## 2020-01-22 MED ORDER — FAMOTIDINE IN NACL 20-0.9 MG/50ML-% IV SOLN: 20.0000 mg | Freq: Once | INTRAVENOUS | Status: DC | PRN

## 2020-01-22 MED ORDER — AZITHROMYCIN 250 MG PO TABS
500.0000 mg | ORAL_TABLET | Freq: Once | ORAL | Status: AC
  Administered 2020-01-22: 500 mg via ORAL
  Filled 2020-01-22: qty 2

## 2020-01-22 MED ORDER — AMOXICILLIN-POT CLAVULANATE 875-125 MG PO TABS
1.0000 | ORAL_TABLET | Freq: Once | ORAL | Status: AC
  Administered 2020-01-22: 1 via ORAL
  Filled 2020-01-22: qty 1

## 2020-01-22 NOTE — Discharge Instructions (Signed)
As discussed, you will be contacted for additional therapy as indicated for your coronavirus infection. Otherwise be sure to follow-up with your physician for appropriate ongoing outpatient management of coronavirus.  Return here for concerning changes in your condition.

## 2020-01-22 NOTE — ED Provider Notes (Addendum)
[facility] EMERGENCY DEPARTMENT Provider Note   CSN: [id number] Arrival date & time: 01/21/20  1813     History Chief Complaint  Patient presents with  . COVID    Robin Davis is a 39 y.o. female with PMH of HTN on amlodipine and losartan-hydrochlorothiazide, obesity, and positive COVID-19 testing obtained 01/16/2020 who returns to the ED with complaints of increasing shortness of breath symptoms.  I reviewed patient's medical record and per her initial urgent care note, patient is day 7 since symptom onset.  She was evaluated here in the ED on 01/19/2020 with similar concerns for progressive worsening of her COVID-19 symptoms, however she was not hypoxic and was deemed safe for discharge.  At that time, patient had concerns surrounding monoclonal antibody infusions and questions regarding potential side effects.  She was counseled that the MAB infusion was in her best interest and that the benefits outweigh the cost, but to speak with the infusion clinic regarding her concerns for adverse effects.  On my examination, patient states that she has been having progressively worsening shortness of breath symptoms, particularly on exertion.  She has been checking her temperature regularly at home and taking Tylenol as needed for fever control.  Patient is also endorsing generalized body aches and mildly diminished appetite.  She also has a pulse oximeter at home and states that her O2 saturation is maintained between 95 to 98%.  Patient denies any chest pain at baseline, however endorses symptoms of pleurisy.  This only occurs if she inhales through her nose rather than through her mouth.  She denies any hemoptysis.  She has a Mirena, progesterone only.  No estrogen use.  No history of clots or clotting disorder.    HPI     Past Medical History:  Diagnosis Date  . Anxiety   . COVID-19   . Foot pain   . Hypertension   . Obesity     Patient Active Problem List    Diagnosis Date Noted  . Snoring 02/22/2019    Past Surgical History:  Procedure Laterality Date  . BREAST REDUCTION SURGERY    . CHOLECYSTECTOMY       OB History    Gravida  5   Para  3   Term  2   Preterm  1   AB  2   Living  2     SAB      TAB      Ectopic      Multiple      Live Births              Family History  Problem Relation Age of Onset  . Hypertension Other   . Diabetes Other     Social History   Tobacco Use  . Smoking status: Never Smoker  . Smokeless tobacco: Never Used  Substance Use Topics  . Alcohol use: Yes    Comment: seldom  . Drug use: No    Home Medications Prior to Admission medications   Medication Sig Start Date End Date Taking? Authorizing Provider  albuterol (PROVENTIL HFA;VENTOLIN HFA) 108 (90 BASE) MCG/ACT inhaler Inhale 1-2 puffs into the lungs every 6 (six) hours as needed for wheezing or shortness of breath. 04/15/14   Elpidio Anis, PA-C  amLODipine (NORVASC) 10 MG tablet Take 10 mg by mouth daily. 12/28/19   [provider]  budesonide (PULMICORT) 180 MCG/ACT inhaler Inhale 1 puff into the lungs in the morning and at bedtime. 01/22/20  Lorelee New, PA-C  cetirizine (ZYRTEC ALLERGY) 10 MG tablet Take 1 tablet (10 mg total) by mouth daily. 01/16/20   Particia Nearing, PA-C  fluticasone [facility]) 50 MCG/ACT nasal spray Place 1 spray into both nostrils 2 (two) times daily. 01/16/20   Particia Nearing, PA-C  fluvoxaMINE (LUVOX) 50 MG tablet Take 50 mg by mouth daily. 10/15/15   [provider]  levonorgestrel (MIRENA) 20 MCG/24HR IUD 1 each by Intrauterine route once. Implanted October 2014    [provider]  losartan-hydrochlorothiazide (HYZAAR) 100-12.5 MG tablet Take 1 tablet by mouth daily. 10/29/15   [provider]  meloxicam (MOBIC) 7.5 MG tablet Take 7.5 mg by mouth daily.    [provider]  phenol (CHLORASEPTIC) 1.4 % LIQD Use as directed 1 spray in the  mouth or throat as needed for throat irritation / pain. 05/13/18   Fawze, Mina A, PA-C  phentermine (ADIPEX-P) 37.5 MG tablet Take 37.5 mg by mouth daily. 03/13/14   [provider]  pseudoephedrine (SUDAFED 12 HOUR) 120 MG 12 hr tablet Take 1 tablet (120 mg total) by mouth 2 (two) times daily. 04/06/17   Wallis Bamberg, PA-C    Allergies    Fish allergy, Lisinopril, Nitrofurantoin macrocrystal, Tramadol, Flagyl [metronidazole hcl], Hydrocodone-acetaminophen, and Vicodin [hydrocodone-acetaminophen]  Review of Systems   Review of Systems  All other systems reviewed and are negative.   Physical Exam Updated Vital Signs BP 118/72 (BP Location: Left Arm)   Pulse (!) 105   Temp 99.1 F (37.3 C) (Oral)   Resp 12   SpO2 96%   Physical Exam Vitals and nursing note reviewed. Exam conducted with a chaperone present.  Constitutional:      Appearance: She is obese. She is ill-appearing.  HENT:     Head: Normocephalic and atraumatic.  Eyes:     General: No scleral icterus.    Conjunctiva/sclera: Conjunctivae normal.  Cardiovascular:     Rate and Rhythm: Regular rhythm. Tachycardia present.     Pulses: Normal pulses.     Heart sounds: Normal heart sounds.  Pulmonary:     Comments: No significant increased work of breathing.  No obvious abnormal breath sounds.  No wheezing.  Breath sounds intact bilaterally.  Symmetric chest rise.   Musculoskeletal:     Cervical back: Normal range of motion and neck supple. No rigidity.     Right lower leg: No edema.     Left lower leg: No edema.     Comments: No unilateral extremity swelling, overlying skin changes, or tenderness to palpation.    Skin:    General: Skin is dry.  Neurological:     Mental Status: She is alert.     GCS: GCS eye subscore is 4. GCS verbal subscore is 5. GCS motor subscore is 6.  Psychiatric:        Mood and Affect: Mood normal.        Behavior: Behavior normal.        Thought Content: Thought content normal.      ED Results / Procedures / Treatments   Labs (all labs ordered are listed, but only abnormal results are displayed) Labs Reviewed  CBC - Abnormal; Notable for the following components:      Result Value   WBC 3.8 (*)    All other components within normal limits  BASIC METABOLIC PANEL - Abnormal; Notable for the following components:   Sodium 134 (*)    Potassium 3.2 (*)  Chloride 95 (*)    Glucose, Bld 140 (*)    BUN 5 (*)    All other components within normal limits    EKG EKG Interpretation  Date/Time:  Saturday January 21 2020 19:26:53 EDT Ventricular Rate:  102 PR Interval:  148 QRS Duration: 86 QT Interval:  330 QTC Calculation: 430 R Axis:   47 Text Interpretation: Sinus tachycardia Otherwise normal ECG No STEMI Confirmed by Alvester Chourifan, Matthew [phone]([id number]) on 01/22/2020 11:01:43 AM   Radiology DG Chest Portable 1 View  Result Date: 01/21/2020 CLINICAL DATA:  39 year old who is COVID-19 positive, presenting with worsening shortness of breath on exertion and cough. EXAM: PORTABLE CHEST 1 VIEW COMPARISON:  01/19/2020 and earlier, including CTA chest 05/20/2018. FINDINGS: Suboptimal inspiration accounts for crowded bronchovascular markings, especially in the bases, and accentuates the cardiac silhouette. Taking this into account, cardiomediastinal silhouette unremarkable and unchanged. Lungs clear. Bronchovascular markings normal. Pulmonary vascularity normal. No visible pleural effusions. No pneumothorax. IMPRESSION: Suboptimal inspiration. No acute cardiopulmonary disease. Electronically Signed   By: Hulan Saashomas  Lawrence M.D.   On: 01/21/2020 19:52    Procedures Procedures (including critical care time)  Medications Ordered in ED Medications  acetaminophen (TYLENOL) tablet 650 mg (650 mg Oral Given 01/22/20 0119)    ED Course  I have reviewed the triage vital signs and the nursing notes.  Pertinent labs & imaging results that were available during my care of the  patient were reviewed by me and considered in my medical decision making (see chart for details).    MDM Rules/Calculators/A&P                          Patient is day 7 into her course of illness, positive COVID-19 testing obtained 01/16/2020.  She has been in communication with providers at the MAB infusion clinic, but has not yet scheduled a time for infusion.  On multiple occasions, they have attempted to address her concerns regarding side effects and answer all questions, however she is still reluctant to proceed with treatment.    After our discussion regarding risks and benefits, patient agrees to proceed with the MAB therapy.  Labs CBC: Leukopenia to 3.8 consistent with COVID-19 diagnosis. BMP: Mild hypokalemia to 3.2 and hyponatremia to 134, both appear to be relatively consistent with patient's baseline.  Will replenish potassium here in the ED.  EKG is reviewed and demonstrates sinus tachycardia to 102 bpm.  DG chest is personally reviewed and there is suboptimal inspiration, but no obvious acute cardiopulmonary disease.  While patient is mildly tachycardic here in the ED, she states that she has not had anything to eat or drink since coming to the ER 17 hours ago.  She also has been borderline febrile which I believe is a contributing factor.  While she endorses some pleurisy, she denies any history of clots or clotting disorder.  She is neither tachypneic nor hypoxic.  No hemoptysis.  Risk factor of COVID-19, no other concerning risk factors.  She is not demonstrating any increased work of breathing and I do not feel as though CTA chest to evaluate for pulmonary embolism is warranted at this time.  Patient was ambulated here in the ED and maintained saturation between 93 to 96% on RA with no significant increased work of breathing.  I will prescribe Pulmicort twice daily to aid with her shortness of breath symptoms.  Discussed case with Dr. Renaye Rakersrifan who agrees with assessment and  plan.  Robin Davis  was evaluated in Emergency Department on 01/22/2020 for the symptoms described in the history of present illness. She was evaluated in the context of the global COVID-19 pandemic, which necessitated consideration that the patient might be at risk for infection with the SARS-CoV-2 virus that causes COVID-19. Institutional protocols and algorithms that pertain to the evaluation of patients at risk for COVID-19 are in a state of rapid change based on information released by regulatory bodies including the CDC and federal and state organizations. These policies and algorithms were followed during the patient's care in the ED.  Final Clinical Impression(s) / ED Diagnoses Final diagnoses:  COVID-19    Rx / DC Orders ED Discharge Orders         Ordered    budesonide (PULMICORT) 180 MCG/ACT inhaler  2 times daily        01/22/20 1139           Lorelee New, PA-C 01/22/20 1143    Lorelee New, PA-C 01/22/20 1143    Terald Sleeper, MD 01/22/20 1654

## 2020-01-22 NOTE — ED Provider Notes (Signed)
[facility]-EMERGENCY DEPT Provider Note   CSN: [id number] Arrival date & time: 01/22/20  1941     History No chief complaint on file.   Robin Davis is a 39 y.o. female.  HPI    Patient presents for the third time during her Covid experience, now with concern for persistent dyspnea. Patient did not get vaccinated. Her illness began almost 10 days ago, she was diagnosed about 1 week ago. She notes that she has been seen and evaluated several times since then, but has persistent dyspnea. No new fever, no new vomiting, no new pain. She does have decreased capacity to perform ADL, secondary to dyspnea and fatigue. She notes that just prior to ED arrival she was contacted by oral monoclonal antibody infusion center and they discussed scheduling for initiation of this therapy tomorrow. Past Medical History:  Diagnosis Date  . Anxiety   . COVID-19   . Foot pain   . Hypertension   . Obesity     Patient Active Problem List   Diagnosis Date Noted  . Snoring 02/22/2019    Past Surgical History:  Procedure Laterality Date  . BREAST REDUCTION SURGERY    . CHOLECYSTECTOMY       OB History    Gravida  5   Para  3   Term  2   Preterm  1   AB  2   Living  2     SAB      TAB      Ectopic      Multiple      Live Births              Family History  Problem Relation Age of Onset  . Hypertension Other   . Diabetes Other     Social History   Tobacco Use  . Smoking status: Never Smoker  . Smokeless tobacco: Never Used  Substance Use Topics  . Alcohol use: Yes    Comment: seldom  . Drug use: No    Home Medications Prior to Admission medications   Medication Sig Start Date End Date Taking? Authorizing Provider  albuterol (PROVENTIL HFA;VENTOLIN HFA) 108 (90 BASE) MCG/ACT inhaler Inhale 1-2 puffs into the lungs every 6 (six) hours as needed for wheezing or shortness of breath. 04/15/14   Elpidio Anis, PA-C  amLODipine  (NORVASC) 10 MG tablet Take 10 mg by mouth daily. 12/28/19   [provider]  budesonide (PULMICORT) 180 MCG/ACT inhaler Inhale 1 puff into the lungs in the morning and at bedtime. 01/22/20   Lorelee New, PA-C  cetirizine (ZYRTEC ALLERGY) 10 MG tablet Take 1 tablet (10 mg total) by mouth daily. 01/16/20   Particia Nearing, PA-C  fluticasone [facility]) 50 MCG/ACT nasal spray Place 1 spray into both nostrils 2 (two) times daily. 01/16/20   Particia Nearing, PA-C  fluvoxaMINE (LUVOX) 50 MG tablet Take 50 mg by mouth daily. 10/15/15   [provider]  levonorgestrel (MIRENA) 20 MCG/24HR IUD 1 each by Intrauterine route once. Implanted October 2014    [provider]  losartan-hydrochlorothiazide (HYZAAR) 100-12.5 MG tablet Take 1 tablet by mouth daily. 10/29/15   [provider]  meloxicam (MOBIC) 7.5 MG tablet Take 7.5 mg by mouth daily.    [provider]  phenol (CHLORASEPTIC) 1.4 % LIQD Use as directed 1 spray in the mouth or throat as needed for throat irritation / pain. 05/13/18   Luevenia Maxin, Mina A, PA-C  phentermine (ADIPEX-P)  37.5 MG tablet Take 37.5 mg by mouth daily. 03/13/14   [provider]  pseudoephedrine (SUDAFED 12 HOUR) 120 MG 12 hr tablet Take 1 tablet (120 mg total) by mouth 2 (two) times daily. 04/06/17   Wallis Bamberg, PA-C    Allergies    Fish allergy, Lisinopril, Nitrofurantoin macrocrystal, Tramadol, Flagyl [metronidazole hcl], Hydrocodone-acetaminophen, and Vicodin [hydrocodone-acetaminophen]  Review of Systems   Review of Systems  Constitutional:       Per HPI, otherwise negative  HENT:       Per HPI, otherwise negative  Respiratory:       Per HPI, otherwise negative  Cardiovascular:       Per HPI, otherwise negative  Gastrointestinal: Positive for nausea. Negative for vomiting.  Endocrine:       Negative aside from HPI  Genitourinary:       Neg aside from HPI   Musculoskeletal:       Per HPI, otherwise  negative  Skin: Negative.   Allergic/Immunologic: Negative for immunocompromised state.  Neurological: Positive for weakness. Negative for syncope.    Physical Exam Updated Vital Signs BP 125/86   Pulse (!) 117   Temp 100.2 F (37.9 C) (Oral)   Resp (!) 24   SpO2 97%   Physical Exam Vitals and nursing note reviewed.  Constitutional:      General: She is not in acute distress.    Appearance: She is well-developed.  HENT:     Head: Normocephalic and atraumatic.  Eyes:     Conjunctiva/sclera: Conjunctivae normal.  Cardiovascular:     Rate and Rhythm: Regular rhythm. Tachycardia present.     Pulses: Normal pulses.  Pulmonary:     Effort: Tachypnea present.  Abdominal:     General: There is no distension.  Skin:    General: Skin is warm and dry.  Neurological:     Mental Status: She is alert and oriented to person, place, and time.     Cranial Nerves: No cranial nerve deficit.     ED Results / Procedures / Treatments   Labs (all labs ordered are listed, but only abnormal results are displayed) Labs Reviewed  COMPREHENSIVE METABOLIC PANEL - Abnormal; Notable for the following components:      Result Value   Sodium 134 (*)    Potassium 3.3 (*)    Chloride 94 (*)    Glucose, Bld 159 (*)    All other components within normal limits  CBC WITH DIFFERENTIAL/PLATELET - Abnormal; Notable for the following components:   RBC 5.15 (*)    All other components within normal limits  LACTIC ACID, PLASMA  URINALYSIS, ROUTINE W REFLEX MICROSCOPIC  I-STAT BETA HCG BLOOD, ED ([facility], [facility], [facility] ONLY)    EKG None  Radiology DG Chest Port 1 View  Result Date: 01/22/2020 CLINICAL DATA:  Shortness of breath EXAM: PORTABLE CHEST 1 VIEW COMPARISON:  01/21/2020 FINDINGS: There is a new airspace opacity at the left lung base. The heart size is unremarkable. There is no pneumothorax. No large pleural effusion. No acute osseous abnormality. IMPRESSION: New airspace opacity at the left lung base  compatible with pneumonia. Electronically Signed   By: Katherine Mantle M.D.   On: 01/22/2020 21:18   DG Chest Portable 1 View  Result Date: 01/21/2020 CLINICAL DATA:  39 year old who is COVID-19 positive, presenting with worsening shortness of breath on exertion and cough. EXAM: PORTABLE CHEST 1 VIEW COMPARISON:  01/19/2020 and earlier, including CTA chest 05/20/2018. FINDINGS: Suboptimal inspiration accounts for  crowded bronchovascular markings, especially in the bases, and accentuates the cardiac silhouette. Taking this into account, cardiomediastinal silhouette unremarkable and unchanged. Lungs clear. Bronchovascular markings normal. Pulmonary vascularity normal. No visible pleural effusions. No pneumothorax. IMPRESSION: Suboptimal inspiration. No acute cardiopulmonary disease. Electronically Signed   By: Hulan Saas M.D.   On: 01/21/2020 19:52    Procedures Procedures (including critical care time)  Medications Ordered in ED Medications  0.9 %  sodium chloride infusion (has no administration in time range)  diphenhydrAMINE (BENADRYL) injection 50 mg (has no administration in time range)  famotidine (PEPCID) IVPB 20 mg premix (has no administration in time range)  methylPREDNISolone sodium succinate (SOLU-MEDROL) 125 mg/2 mL injection 125 mg (has no administration in time range)  albuterol (VENTOLIN HFA) 108 (90 Base) MCG/ACT inhaler 2 puff (has no administration in time range)  EPINEPHrine (EPI-PEN) injection 0.3 mg (has no administration in time range)  casirivimab-imdevimab (REGEN-COV) 1,200 mg in sodium chloride 0.9 % 110 mL IVPB (0 mg Intravenous Stopped 01/22/20 2219)    ED Course  I have reviewed the triage vital signs and the nursing notes.  Pertinent labs & imaging results that were available during my care of the patient were reviewed by me and considered in my medical decision making (see chart for details).     Chart review notable for 4 prior ED visits in the past 6  months including 2 during coronavirus.  After initial evaluation with consideration of coinfection patient had x-ray, labs. With tachycardia, symptoms, she will receive fluids, Toradol, Tylenol.  Monoclonal antibody infusion considered as well.  MDM Rules/Calculators/A&P                          11:00 PM Patient is completed monoclonal antibody infusion, is receiving IV fluids.  She has mild tachycardia, but no hypoxia, with a saturation of 94% on room air. She is awake, alert, speaking clearly.  We discussed today's findings, her illness, possibility for concurrent peritoneal infection, though she has no lactic acidosis, no leukocytosis. Given the absence of hypoxia, distress, though noting the patient does have active Covid infection, she is still appropriate for discharge with close outpatient follow-up including consideration of additional adjuvant therapy. Final Clinical Impression(s) / ED Diagnoses Final diagnoses:  COVID-19     Gerhard Munch, MD 01/22/20 2301

## 2020-01-22 NOTE — Discharge Instructions (Addendum)
Please continue to check your temperature regularly and take Tylenol or ibuprofen as needed for fever control.  I would also like for you to continue checking your oxygenation using your pulse oximeter.  Please do not hesitate to return to the ED or seek immediate medical attention should you become hypoxic or experience worsening shortness of breath symptoms, chest pain, or any other new or worsening symptoms.  I have prescribed you Pulmicort which you can use twice daily to help with your shortness of breath symptoms.  There have been studies showing that it reduces need for hospitalization.  However, I still strongly advised that you proceed with MAB infusion as that will be in your best interest.

## 2020-01-22 NOTE — ED Triage Notes (Signed)
Pt tested COVID + on Monday. Pt c/o cough, chest pain, shob, nausea, diarrhea, and low o2 levels. Denies vomiting. A&O and ambulatory. Pt currently 96% on RA.

## 2020-01-22 NOTE — Care Management (Signed)
Patient called soon after discharge Insurance will not approve pulmocort. Discussed with Hale Drone PA and pharmacist. Called in Flovent HFA 44 2 puffs bid and follow up with PCP.Robin Davis

## 2020-01-22 NOTE — ED Notes (Signed)
Ambulated pt in hallway and pt did well. Pt's O2 sat's maintained above 92%.

## 2020-01-23 ENCOUNTER — Inpatient Hospital Stay (HOSPITAL_COMMUNITY)
Admission: EM | Admit: 2020-01-23 | Discharge: 2020-01-29 | DRG: 177 | Disposition: A | Discharge status: Home / Self Care | Payer: Medicaid Other | Attending: Internal Medicine | Admitting: Internal Medicine

## 2020-01-23 ENCOUNTER — Encounter (HOSPITAL_COMMUNITY): Payer: Self-pay | Admitting: Emergency Medicine

## 2020-01-23 ENCOUNTER — Other Ambulatory Visit: Payer: Self-pay

## 2020-01-23 ENCOUNTER — Emergency Department (HOSPITAL_COMMUNITY): Payer: Medicaid Other

## 2020-01-23 DIAGNOSIS — R0902 Hypoxemia: Secondary | ICD-10-CM

## 2020-01-23 DIAGNOSIS — E876 Hypokalemia: Secondary | ICD-10-CM | POA: Diagnosis present

## 2020-01-23 DIAGNOSIS — J9601 Acute respiratory failure with hypoxia: Secondary | ICD-10-CM | POA: Diagnosis present

## 2020-01-23 DIAGNOSIS — Z6835 Body mass index (BMI) 35.0-35.9, adult: Secondary | ICD-10-CM

## 2020-01-23 DIAGNOSIS — J9621 Acute and chronic respiratory failure with hypoxia: Secondary | ICD-10-CM | POA: Diagnosis present

## 2020-01-23 DIAGNOSIS — F32A Depression, unspecified: Secondary | ICD-10-CM | POA: Diagnosis present

## 2020-01-23 DIAGNOSIS — I10 Essential (primary) hypertension: Secondary | ICD-10-CM | POA: Diagnosis present

## 2020-01-23 DIAGNOSIS — U071 COVID-19: Principal | ICD-10-CM | POA: Diagnosis present

## 2020-01-23 DIAGNOSIS — J1282 Pneumonia due to coronavirus disease 2019: Secondary | ICD-10-CM | POA: Diagnosis present

## 2020-01-23 DIAGNOSIS — E1165 Type 2 diabetes mellitus with hyperglycemia: Secondary | ICD-10-CM | POA: Diagnosis present

## 2020-01-23 DIAGNOSIS — R000 Tachycardia, unspecified: Secondary | ICD-10-CM | POA: Diagnosis present

## 2020-01-23 DIAGNOSIS — F329 Major depressive disorder, single episode, unspecified: Secondary | ICD-10-CM | POA: Diagnosis present

## 2020-01-23 LAB — URINALYSIS, ROUTINE W REFLEX MICROSCOPIC
Bilirubin Urine: NEGATIVE
Glucose, UA: NEGATIVE mg/dL
Hgb urine dipstick: NEGATIVE
Ketones, ur: NEGATIVE mg/dL
Leukocytes,Ua: NEGATIVE
Nitrite: NEGATIVE
Protein, ur: NEGATIVE mg/dL
Specific Gravity, Urine: 1.004 — ABNORMAL LOW (ref 1.005–1.030)
pH: 7 (ref 5.0–8.0)

## 2020-01-23 IMAGING — DX DG CHEST 1V PORT
1 series · 1 of 1 positions shown · non-contrast
Comparison: 01/22/2020

CLINICAL DATA: Shortness of breath, COVID

EXAM:
PORTABLE CHEST 1 VIEW

[chest ap]
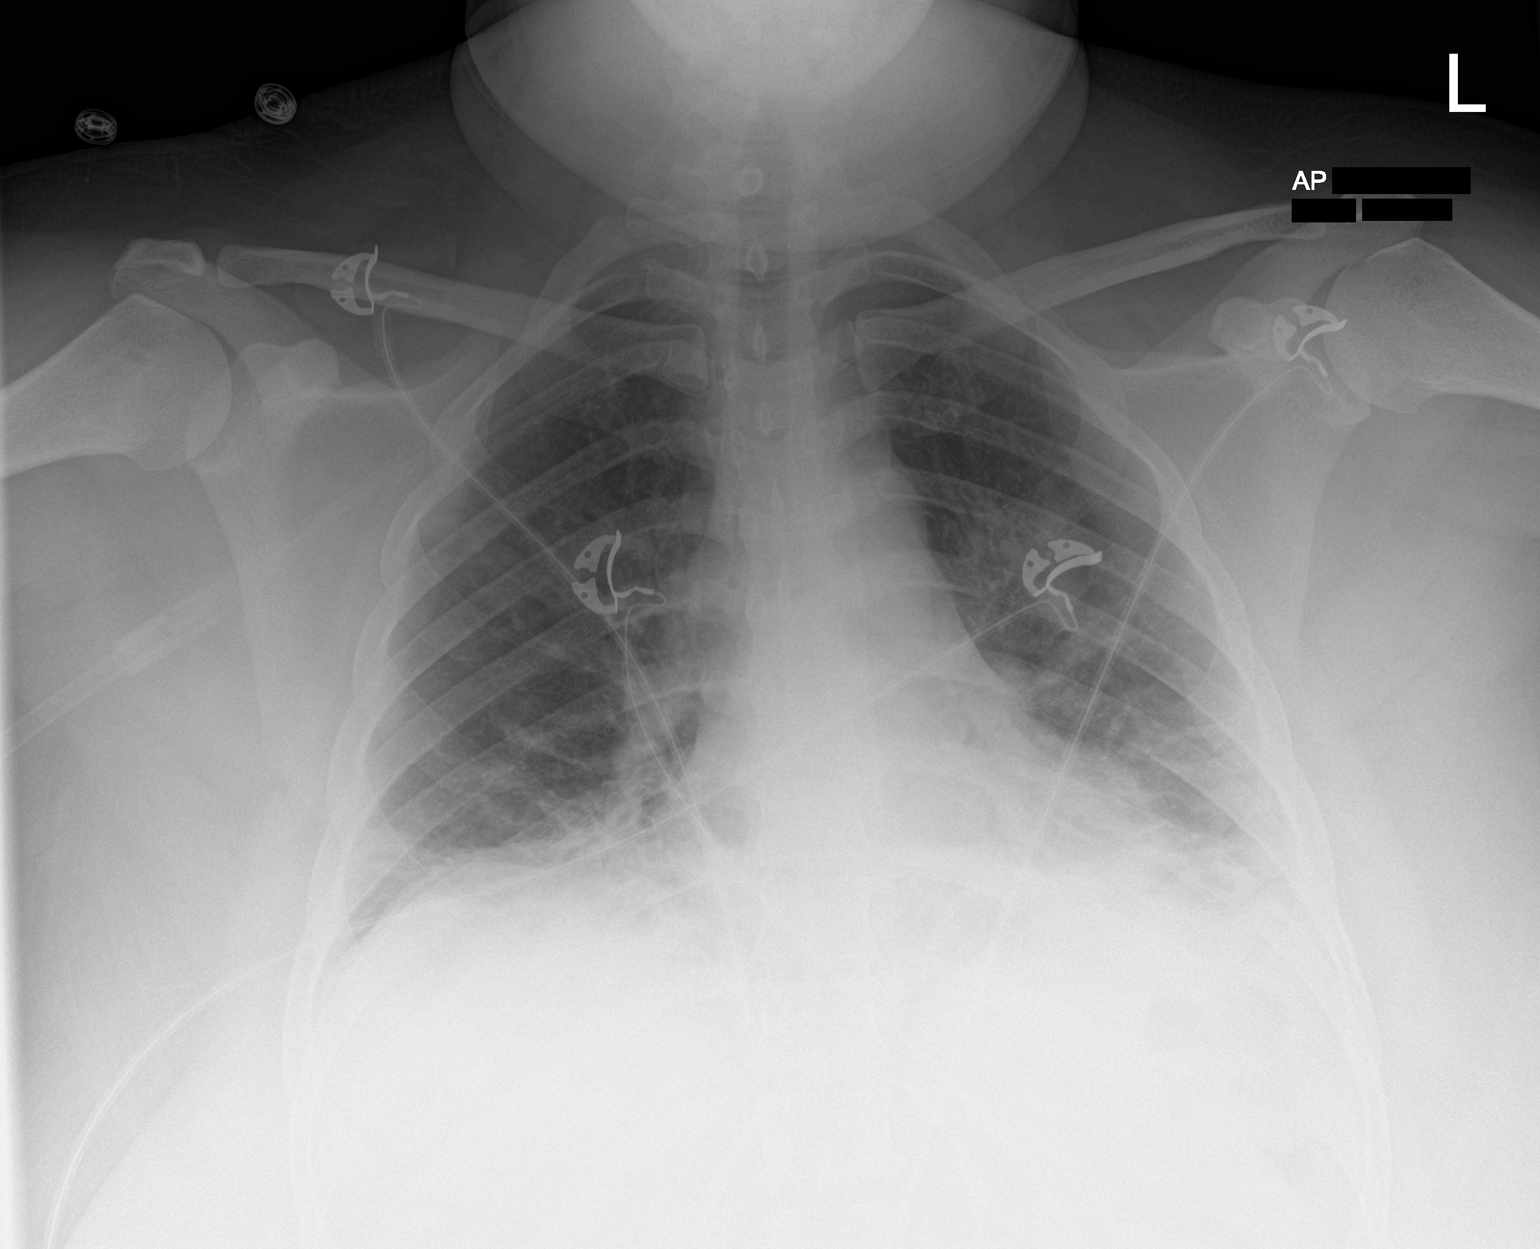

[1 of 1 positions shown; findings below may reference images not displayed]

FINDINGS: Worsening airspace disease at the bases. No pleural effusion. Stable
cardiomediastinal silhouette. No pneumothorax
IMPRESSION: Worsening airspace disease at the bases concerning for pneumonia.

## 2020-01-23 MED ORDER — DEXAMETHASONE SODIUM PHOSPHATE 10 MG/ML IJ SOLN
10.0000 mg | Freq: Once | INTRAMUSCULAR | Status: AC
  Administered 2020-01-23: 10 mg via INTRAVENOUS
  Filled 2020-01-23: qty 1

## 2020-01-23 MED ORDER — SODIUM CHLORIDE 0.9 % IV SOLN
100.0000 mg | Freq: Every day | INTRAVENOUS | Status: AC
  Administered 2020-01-24 – 2020-01-27 (×4): 100 mg via INTRAVENOUS
  Filled 2020-01-23 (×4): qty 20

## 2020-01-23 MED ORDER — SODIUM CHLORIDE 0.9 % IV SOLN
200.0000 mg | Freq: Once | INTRAVENOUS | Status: AC
  Administered 2020-01-24: 200 mg via INTRAVENOUS
  Filled 2020-01-23: qty 200

## 2020-01-23 NOTE — ED Provider Notes (Signed)
[facility]-EMERGENCY DEPT Provider Note   CSN: [id number] Arrival date & time: 01/23/20  2307     History Chief Complaint  Patient presents with  . Shortness of Breath    Robin Davis is a 39 y.o. female.  HPI     This a 39 year old female with a history of hypertension and diagnosis of COVID-19 who presents with worsening shortness of breath.  This is her fourth visit to the ED and third visit in the last 3 days for worsening shortness of breath.  During her prior evaluation she has not been hypoxic.  She did receive monoclonal antibody yesterday.  Patient states that she has had worsening shortness of breath and cough.  O2 sats noted by EMS to be in the high 70s.  She is also reporting some diarrhea.  No noted fevers at home.  No chest pain, abdominal pain, vomiting.  She is not vaccinated.  Reports generalized fatigue.  Past Medical History:  Diagnosis Date  . Anxiety   . COVID-19   . Foot pain   . Hypertension   . Obesity     Patient Active Problem List   Diagnosis Date Noted  . Snoring 02/22/2019    Past Surgical History:  Procedure Laterality Date  . BREAST REDUCTION SURGERY    . CHOLECYSTECTOMY       OB History    Gravida  5   Para  3   Term  2   Preterm  1   AB  2   Living  2     SAB      TAB      Ectopic      Multiple      Live Births              Family History  Problem Relation Age of Onset  . Hypertension Other   . Diabetes Other     Social History   Tobacco Use  . Smoking status: Never Smoker  . Smokeless tobacco: Never Used  Substance Use Topics  . Alcohol use: Yes    Comment: seldom  . Drug use: No    Home Medications Prior to Admission medications   Medication Sig Start Date End Date Taking? Authorizing Provider  albuterol (PROVENTIL HFA;VENTOLIN HFA) 108 (90 BASE) MCG/ACT inhaler Inhale 1-2 puffs into the lungs every 6 (six) hours as needed for wheezing or shortness of breath.  04/15/14   Elpidio Anis, PA-C  amLODipine (NORVASC) 10 MG tablet Take 10 mg by mouth daily. 12/28/19   [provider]  amoxicillin-clavulanate (AUGMENTIN) 875-125 MG tablet Take 1 tablet by mouth every 12 (twelve) hours. 01/22/20   Gerhard Munch, MD  azithromycin (ZITHROMAX) 250 MG tablet Take 1 tablet (250 mg total) by mouth daily for 4 days. Take 1 every day until finished. 01/22/20 01/26/20  Gerhard Munch, MD  budesonide (PULMICORT) 180 MCG/ACT inhaler Inhale 1 puff into the lungs in the morning and at bedtime. 01/22/20   Lorelee New, PA-C  cetirizine (ZYRTEC ALLERGY) 10 MG tablet Take 1 tablet (10 mg total) by mouth daily. 01/16/20   Particia Nearing, PA-C  fluticasone [facility]) 50 MCG/ACT nasal spray Place 1 spray into both nostrils 2 (two) times daily. 01/16/20   Particia Nearing, PA-C  fluvoxaMINE (LUVOX) 50 MG tablet Take 50 mg by mouth daily. 10/15/15   [provider]  levonorgestrel (MIRENA) 20 MCG/24HR IUD 1 each by Intrauterine route once. Implanted October 2014  [provider]  losartan-hydrochlorothiazide (HYZAAR) 100-12.5 MG tablet Take 1 tablet by mouth daily. 10/29/15   [provider]  meloxicam (MOBIC) 7.5 MG tablet Take 7.5 mg by mouth daily.    [provider]  phenol (CHLORASEPTIC) 1.4 % LIQD Use as directed 1 spray in the mouth or throat as needed for throat irritation / pain. 05/13/18   Fawze, Mina A, PA-C  phentermine (ADIPEX-P) 37.5 MG tablet Take 37.5 mg by mouth daily. 03/13/14   [provider]  pseudoephedrine (SUDAFED 12 HOUR) 120 MG 12 hr tablet Take 1 tablet (120 mg total) by mouth 2 (two) times daily. 04/06/17   Wallis Bamberg, PA-C    Allergies    Fish allergy, Lisinopril, Nitrofurantoin macrocrystal, Tramadol, Flagyl [metronidazole hcl], Hydrocodone-acetaminophen, and Vicodin [hydrocodone-acetaminophen]  Review of Systems   Review of Systems  Constitutional: Negative for fever.  Respiratory:  Positive for cough and shortness of breath.   Cardiovascular: Negative for chest pain.  Gastrointestinal: Positive for diarrhea. Negative for abdominal pain, nausea and vomiting.  Genitourinary: Negative for dysuria.  All other systems reviewed and are negative.   Physical Exam Updated Vital Signs BP 119/71   Pulse (!) 109   Temp 99.1 F (37.3 C) (Oral)   Resp 15   Ht 1.676 m (5\' 6" )   Wt 99 kg   SpO2 92%   BMI 35.23 kg/m   Physical Exam Vitals and nursing note reviewed.  Constitutional:      Appearance: She is well-developed. She is obese. She is not ill-appearing.  HENT:     Head: Normocephalic and atraumatic.     Mouth/Throat:     Mouth: Mucous membranes are moist.  Eyes:     Pupils: Pupils are equal, round, and reactive to light.  Cardiovascular:     Rate and Rhythm: Regular rhythm. Tachycardia present.     Heart sounds: Normal heart sounds.  Pulmonary:     Effort: Pulmonary effort is normal. No respiratory distress.     Breath sounds: No wheezing.     Comments: Diminished breath sounds in all lung fields Abdominal:     General: Bowel sounds are normal.     Palpations: Abdomen is soft.  Musculoskeletal:     Cervical back: Neck supple.     Right lower leg: No tenderness. No edema.     Left lower leg: No edema.  Skin:    General: Skin is warm and dry.  Neurological:     Mental Status: She is alert and oriented to person, place, and time.  Psychiatric:        Mood and Affect: Mood normal.     ED Results / Procedures / Treatments   Labs (all labs ordered are listed, but only abnormal results are displayed) Labs Reviewed  CBC WITH DIFFERENTIAL/PLATELET - Abnormal; Notable for the following components:      Result Value   Abs Immature Granulocytes 0.08 (*)    All other components within normal limits  COMPREHENSIVE METABOLIC PANEL - Abnormal; Notable for the following components:   Sodium 134 (*)    Potassium 2.9 (*)    Chloride 96 (*)    Glucose, Bld  204 (*)    BUN <5 (*)    Calcium 8.7 (*)    All other components within normal limits  LACTATE DEHYDROGENASE - Abnormal; Notable for the following components:   LDH 257 (*)    All other components within normal limits  FIBRINOGEN - Abnormal; Notable for the following  components:   Fibrinogen 576 (*)    All other components within normal limits  C-REACTIVE PROTEIN - Abnormal; Notable for the following components:   CRP 19.0 (*)    All other components within normal limits  CULTURE, BLOOD (ROUTINE X 2)  CULTURE, BLOOD (ROUTINE X 2)  LACTIC ACID, PLASMA  D-DIMER, QUANTITATIVE (NOT AT [facility])  PROCALCITONIN  FERRITIN  TRIGLYCERIDES    EKG EKG Interpretation  Date/Time:  Monday January 23 2020 23:31:16 EDT Ventricular Rate:  116 PR Interval:    QRS Duration: 105 QT Interval:  426 QTC Calculation: 592 R Axis:   44 Text Interpretation: Sinus tachycardia Low voltage, precordial leads RSR' in V1 or V2, right VCD or RVH Borderline T abnormalities, diffuse leads Prolonged QT interval Confirmed by Ross MarcusHorton, Thekla Colborn ([id number]) on 01/24/2020 1:27:39 AM   Radiology DG Chest Port 1 View  Result Date: 01/22/2020 CLINICAL DATA:  Shortness of breath EXAM: PORTABLE CHEST 1 VIEW COMPARISON:  01/21/2020 FINDINGS: There is a new airspace opacity at the left lung base. The heart size is unremarkable. There is no pneumothorax. No large pleural effusion. No acute osseous abnormality. IMPRESSION: New airspace opacity at the left lung base compatible with pneumonia. Electronically Signed   By: Katherine Mantlehristopher  Green M.D.   On: 01/22/2020 21:18    Procedures .Critical Care Performed by: Shon BatonHorton, Alizee Maple F, MD Authorized by: Shon BatonHorton, Tevin Shillingford F, MD   Critical care provider statement:    Critical care time (minutes):  45   Critical care was time spent personally by me on the following activities:  Discussions with consultants, evaluation of patient's response to treatment, examination of patient, ordering and  performing treatments and interventions, ordering and review of laboratory studies, ordering and review of radiographic studies, pulse oximetry, re-evaluation of patient's condition, obtaining history from patient or surrogate and review of old charts   (including critical care time)  Medications Ordered in ED Medications  remdesivir 200 mg in sodium chloride 0.9% 250 mL IVPB (0 mg Intravenous Stopped 01/24/20 0100)    Followed by  remdesivir 100 mg in sodium chloride 0.9 % 100 mL IVPB (has no administration in time range)  potassium chloride 10 mEq in 100 mL IVPB (has no administration in time range)  dexamethasone (DECADRON) injection 10 mg (10 mg Intravenous Given 01/23/20 2355)    ED Course  I have reviewed the triage vital signs and the nursing notes.  Pertinent labs & imaging results that were available during my care of the patient were reviewed by me and considered in my medical decision making (see chart for details).    MDM Rules/Calculators/A&P                          Patient presents with progressively worsening shortness of breath.  COVID-19 positive on last Monday.  She has had multiple ED visits and received monoclonal antibody on 9/12.  She was hypoxic for EMS and is on 2 L of nasal cannula upon arrival.  She is slightly tachypneic but otherwise nontoxic appearing.  Repeat preadmission lab work obtained.  Notable for potassium of 2.9.  EKG without acute ischemic or arrhythmic changes.  CBC is reassuring.  Lactate is normal.  Doubt sepsis.  X-rays pending but appears slightly worsened on my read with developing pleural effusion left greater than right.  Patient was given IV Decadron, IV potassium, and remdesivir.  We will plan for admission for hypoxia and COVID-19 pneumonia.  Robin Davis was  evaluated in Emergency Department on 01/24/2020 for the symptoms described in the history of present illness. She was evaluated in the context of the global COVID-19 pandemic, which  necessitated consideration that the patient might be at risk for infection with the SARS-CoV-2 virus that causes COVID-19. Institutional protocols and algorithms that pertain to the evaluation of patients at risk for COVID-19 are in a state of rapid change based on information released by regulatory bodies including the CDC and federal and state organizations. These policies and algorithms were followed during the patient's care in the ED.  Final Clinical Impression(s) / ED Diagnoses Final diagnoses:  Hypoxia  COVID-19  Hypokalemia    Rx / DC Orders ED Discharge Orders    None       Jamani Bearce, Mayer Masker, MD 01/24/20 0131

## 2020-01-23 NOTE — ED Triage Notes (Signed)
Pt arrived via EMS. Pt was seen here yesterday for shortness of breath relating to covid. Pt is still short of breath and says that she cannot breathe at home. Pt's oxygen level is 87 on room air.

## 2020-01-24 DIAGNOSIS — E876 Hypokalemia: Secondary | ICD-10-CM | POA: Diagnosis not present

## 2020-01-24 DIAGNOSIS — F329 Major depressive disorder, single episode, unspecified: Secondary | ICD-10-CM

## 2020-01-24 DIAGNOSIS — Z6835 Body mass index (BMI) 35.0-35.9, adult: Secondary | ICD-10-CM | POA: Diagnosis not present

## 2020-01-24 DIAGNOSIS — R0902 Hypoxemia: Secondary | ICD-10-CM | POA: Diagnosis not present

## 2020-01-24 DIAGNOSIS — E1165 Type 2 diabetes mellitus with hyperglycemia: Secondary | ICD-10-CM | POA: Diagnosis present

## 2020-01-24 DIAGNOSIS — J9621 Acute and chronic respiratory failure with hypoxia: Secondary | ICD-10-CM | POA: Diagnosis present

## 2020-01-24 DIAGNOSIS — J1282 Pneumonia due to coronavirus disease 2019: Secondary | ICD-10-CM | POA: Diagnosis present

## 2020-01-24 DIAGNOSIS — R000 Tachycardia, unspecified: Secondary | ICD-10-CM | POA: Diagnosis present

## 2020-01-24 DIAGNOSIS — U071 COVID-19: Principal | ICD-10-CM

## 2020-01-24 DIAGNOSIS — F32A Depression, unspecified: Secondary | ICD-10-CM | POA: Diagnosis present

## 2020-01-24 DIAGNOSIS — I10 Essential (primary) hypertension: Secondary | ICD-10-CM | POA: Diagnosis present

## 2020-01-24 DIAGNOSIS — J9601 Acute respiratory failure with hypoxia: Secondary | ICD-10-CM | POA: Diagnosis present

## 2020-01-24 DIAGNOSIS — F320 Major depressive disorder, single episode, mild: Secondary | ICD-10-CM | POA: Diagnosis not present

## 2020-01-24 LAB — CBC WITH DIFFERENTIAL/PLATELET
Abs Immature Granulocytes: 0.08 10*3/uL — ABNORMAL HIGH (ref 0.00–0.07)
Basophils Absolute: 0 10*3/uL (ref 0.0–0.1)
Basophils Relative: 0 %
Eosinophils Absolute: 0 10*3/uL (ref 0.0–0.5)
Eosinophils Relative: 0 %
HCT: 40.1 % (ref 36.0–46.0)
Hemoglobin: 13.4 g/dL (ref 12.0–15.0)
Immature Granulocytes: 1 %
Lymphocytes Relative: 19 %
Lymphs Abs: 1.5 10*3/uL (ref 0.7–4.0)
MCH: 27.3 pg (ref 26.0–34.0)
MCHC: 33.4 g/dL (ref 30.0–36.0)
MCV: 81.7 fL (ref 80.0–100.0)
Monocytes Absolute: 0.9 10*3/uL (ref 0.1–1.0)
Monocytes Relative: 10 %
Neutro Abs: 5.7 10*3/uL (ref 1.7–7.7)
Neutrophils Relative %: 70 %
Platelets: 264 10*3/uL (ref 150–400)
RBC: 4.91 MIL/uL (ref 3.87–5.11)
RDW: 13.2 % (ref 11.5–15.5)
WBC: 8.2 10*3/uL (ref 4.0–10.5)
nRBC: 0 % (ref 0.0–0.2)

## 2020-01-24 LAB — LACTATE DEHYDROGENASE: LDH: 257 U/L — ABNORMAL HIGH (ref 98–192)

## 2020-01-24 LAB — CBC
HCT: 33.3 % — ABNORMAL LOW (ref 36.0–46.0)
Hemoglobin: 11.2 g/dL — ABNORMAL LOW (ref 12.0–15.0)
MCH: 27.9 pg (ref 26.0–34.0)
MCHC: 33.6 g/dL (ref 30.0–36.0)
MCV: 83 fL (ref 80.0–100.0)
Platelets: 218 10*3/uL (ref 150–400)
RBC: 4.01 MIL/uL (ref 3.87–5.11)
RDW: 13.4 % (ref 11.5–15.5)
WBC: 6.7 10*3/uL (ref 4.0–10.5)
nRBC: 0 % (ref 0.0–0.2)

## 2020-01-24 LAB — COMPREHENSIVE METABOLIC PANEL
ALT: 35 U/L (ref 0–44)
ALT: 36 U/L (ref 0–44)
AST: 39 U/L (ref 15–41)
AST: 50 U/L — ABNORMAL HIGH (ref 15–41)
Albumin: 3.6 g/dL (ref 3.5–5.0)
Albumin: 3.4 g/dL — ABNORMAL LOW (ref 3.5–5.0)
Alkaline Phosphatase: 58 U/L (ref 38–126)
Alkaline Phosphatase: 55 U/L (ref 38–126)
Anion gap: 13 (ref 5–15)
Anion gap: 10 (ref 5–15)
BUN: <5 mg/dL — ABNORMAL LOW (ref 6–20)
BUN: <5 mg/dL — ABNORMAL LOW (ref 6–20)
CO2: 25 mmol/L (ref 22–32)
CO2: 24 mmol/L (ref 22–32)
Calcium: 8.7 mg/dL — ABNORMAL LOW (ref 8.9–10.3)
Calcium: 8.1 mg/dL — ABNORMAL LOW (ref 8.9–10.3)
Chloride: 96 mmol/L — ABNORMAL LOW (ref 98–111)
Chloride: 100 mmol/L (ref 98–111)
Creatinine, Ser: 0.69 mg/dL (ref 0.44–1.00)
Creatinine, Ser: 0.69 mg/dL (ref 0.44–1.00)
GFR calc Af Amer: >60 mL/min (ref >60)
GFR calc Af Amer: >60 mL/min (ref >60)
GFR calc non Af Amer: >60 mL/min (ref >60)
GFR calc non Af Amer: >60 mL/min (ref >60)
Glucose, Bld: 204 mg/dL — ABNORMAL HIGH (ref 70–99)
Glucose, Bld: 195 mg/dL — ABNORMAL HIGH (ref 70–99)
Potassium: 2.9 mmol/L — ABNORMAL LOW (ref 3.5–5.1)
Potassium: 4.5 mmol/L (ref 3.5–5.1)
Sodium: 134 mmol/L — ABNORMAL LOW (ref 135–145)
Sodium: 134 mmol/L — ABNORMAL LOW (ref 135–145)
Total Bilirubin: 0.6 mg/dL (ref 0.3–1.2)
Total Bilirubin: 1.1 mg/dL (ref 0.3–1.2)
Total Protein: 7.6 g/dL (ref 6.5–8.1)
Total Protein: 7 g/dL (ref 6.5–8.1)

## 2020-01-24 LAB — LACTIC ACID, PLASMA: Lactic Acid, Venous: 1.6 mmol/L (ref 0.5–1.9)

## 2020-01-24 LAB — TRIGLYCERIDES: Triglycerides: 84 mg/dL (ref <150)

## 2020-01-24 LAB — CBG MONITORING, ED
Glucose-Capillary: 176 mg/dL — ABNORMAL HIGH (ref 70–99)
Glucose-Capillary: 163 mg/dL — ABNORMAL HIGH (ref 70–99)
Glucose-Capillary: 147 mg/dL — ABNORMAL HIGH (ref 70–99)
Glucose-Capillary: 164 mg/dL — ABNORMAL HIGH (ref 70–99)
Glucose-Capillary: 216 mg/dL — ABNORMAL HIGH (ref 70–99)

## 2020-01-24 LAB — PROCALCITONIN: Procalcitonin: <0.1 ng/mL

## 2020-01-24 LAB — FERRITIN: Ferritin: 191 ng/mL (ref 11–307)

## 2020-01-24 LAB — MAGNESIUM: Magnesium: 1.9 mg/dL (ref 1.7–2.4)

## 2020-01-24 LAB — HIV ANTIBODY (ROUTINE TESTING W REFLEX): HIV Screen 4th Generation wRfx: NONREACTIVE

## 2020-01-24 LAB — HEMOGLOBIN A1C
Hgb A1c MFr Bld: 6.7 % — ABNORMAL HIGH (ref 4.8–5.6)
Mean Plasma Glucose: 145.59 mg/dL

## 2020-01-24 LAB — D-DIMER, QUANTITATIVE: D-Dimer, Quant: 0.41 ug/mL-FEU (ref 0.00–0.50)

## 2020-01-24 LAB — FIBRINOGEN: Fibrinogen: 576 mg/dL — ABNORMAL HIGH (ref 210–475)

## 2020-01-24 LAB — TSH: TSH: 3.657 u[IU]/mL (ref 0.350–4.500)

## 2020-01-24 LAB — C-REACTIVE PROTEIN: CRP: 19 mg/dL — ABNORMAL HIGH (ref <1.0)

## 2020-01-24 MED ORDER — LORATADINE 10 MG PO TABS
10.0000 mg | ORAL_TABLET | Freq: Every day | ORAL | Status: DC
  Administered 2020-01-24 – 2020-01-29 (×6): 10 mg via ORAL
  Filled 2020-01-24 (×7): qty 1

## 2020-01-24 MED ORDER — PHENTERMINE HCL 37.5 MG PO TABS: 37.5000 mg | ORAL_TABLET | Freq: Every day | ORAL | Status: DC

## 2020-01-24 MED ORDER — AMLODIPINE BESYLATE 10 MG PO TABS
10.0000 mg | ORAL_TABLET | Freq: Every day | ORAL | Status: DC
  Administered 2020-01-24 – 2020-01-29 (×6): 10 mg via ORAL
  Filled 2020-01-24 (×3): qty 1
  Filled 2020-01-24: qty 2
  Filled 2020-01-24: qty 1
  Filled 2020-01-24: qty 2

## 2020-01-24 MED ORDER — PREDNISONE 20 MG PO TABS
50.0000 mg | ORAL_TABLET | Freq: Every day | ORAL | Status: DC
  Administered 2020-01-27 – 2020-01-28 (×2): 50 mg via ORAL
  Filled 2020-01-24 (×2): qty 2

## 2020-01-24 MED ORDER — SPIRONOLACTONE 12.5 MG HALF TABLET
12.5000 mg | ORAL_TABLET | Freq: Every day | ORAL | Status: DC
  Administered 2020-01-24: 12.5 mg via ORAL
  Filled 2020-01-24: qty 1

## 2020-01-24 MED ORDER — POTASSIUM CHLORIDE CRYS ER 20 MEQ PO TBCR
40.0000 meq | EXTENDED_RELEASE_TABLET | ORAL | Status: AC
  Administered 2020-01-24 (×2): 40 meq via ORAL
  Filled 2020-01-24 (×2): qty 2

## 2020-01-24 MED ORDER — POTASSIUM CHLORIDE 10 MEQ/100ML IV SOLN
10.0000 meq | INTRAVENOUS | Status: AC
  Administered 2020-01-24 (×2): 10 meq via INTRAVENOUS
  Filled 2020-01-24 (×2): qty 100

## 2020-01-24 MED ORDER — INSULIN ASPART 100 UNIT/ML ~~LOC~~ SOLN
0.0000 [IU] | Freq: Three times a day (TID) | SUBCUTANEOUS | Status: DC
  Administered 2020-01-24 (×3): 3 [IU] via SUBCUTANEOUS
  Administered 2020-01-25: 5 [IU] via SUBCUTANEOUS
  Administered 2020-01-25: 8 [IU] via SUBCUTANEOUS
  Administered 2020-01-25 – 2020-01-26 (×2): 5 [IU] via SUBCUTANEOUS
  Administered 2020-01-26: 11 [IU] via SUBCUTANEOUS
  Administered 2020-01-26 – 2020-01-27 (×3): 8 [IU] via SUBCUTANEOUS
  Administered 2020-01-27: 11 [IU] via SUBCUTANEOUS
  Administered 2020-01-28: 3 [IU] via SUBCUTANEOUS
  Administered 2020-01-28: 8 [IU] via SUBCUTANEOUS
  Administered 2020-01-28: 15 [IU] via SUBCUTANEOUS
  Administered 2020-01-29: 2 [IU] via SUBCUTANEOUS
  Administered 2020-01-29: 3 [IU] via SUBCUTANEOUS
  Filled 2020-01-24: qty 0.15

## 2020-01-24 MED ORDER — ONDANSETRON HCL 4 MG/2ML IJ SOLN
4.0000 mg | Freq: Three times a day (TID) | INTRAMUSCULAR | Status: DC | PRN
  Administered 2020-01-24: 4 mg via INTRAVENOUS
  Filled 2020-01-24: qty 2

## 2020-01-24 MED ORDER — METHYLPREDNISOLONE SODIUM SUCC 125 MG IJ SOLR
50.0000 mg | Freq: Two times a day (BID) | INTRAMUSCULAR | Status: AC
  Administered 2020-01-24 – 2020-01-26 (×6): 50 mg via INTRAVENOUS
  Filled 2020-01-24 (×6): qty 2

## 2020-01-24 MED ORDER — LOSARTAN POTASSIUM 50 MG PO TABS
100.0000 mg | ORAL_TABLET | Freq: Every day | ORAL | Status: DC
  Administered 2020-01-24 – 2020-01-29 (×6): 100 mg via ORAL
  Filled 2020-01-24 (×6): qty 2

## 2020-01-24 MED ORDER — HYDROCHLOROTHIAZIDE 12.5 MG PO CAPS: 12.5000 mg | ORAL_CAPSULE | Freq: Every day | ORAL | Status: DC

## 2020-01-24 MED ORDER — LOSARTAN POTASSIUM-HCTZ 100-12.5 MG PO TABS: 1.0000 | ORAL_TABLET | Freq: Every day | ORAL | Status: DC

## 2020-01-24 MED ORDER — SODIUM CHLORIDE 0.9 % IV SOLN
500.0000 mg | INTRAVENOUS | Status: DC
  Administered 2020-01-24: 500 mg via INTRAVENOUS
  Filled 2020-01-24: qty 500

## 2020-01-24 MED ORDER — ENOXAPARIN SODIUM 40 MG/0.4ML ~~LOC~~ SOLN
40.0000 mg | SUBCUTANEOUS | Status: DC
  Administered 2020-01-24 – 2020-01-26 (×3): 40 mg via SUBCUTANEOUS
  Filled 2020-01-24 (×3): qty 0.4

## 2020-01-24 MED ORDER — BUDESONIDE 180 MCG/ACT IN AEPB
1.0000 | INHALATION_SPRAY | Freq: Two times a day (BID) | RESPIRATORY_TRACT | Status: DC
  Administered 2020-01-24 – 2020-01-29 (×9): 1 via RESPIRATORY_TRACT
  Filled 2020-01-24 (×2): qty 1

## 2020-01-24 MED ORDER — SENNOSIDES-DOCUSATE SODIUM 8.6-50 MG PO TABS: 1.0000 | ORAL_TABLET | Freq: Every evening | ORAL | Status: DC | PRN

## 2020-01-24 MED ORDER — FLUVOXAMINE MALEATE 50 MG PO TABS
50.0000 mg | ORAL_TABLET | Freq: Every day | ORAL | Status: DC
  Administered 2020-01-24 – 2020-01-28 (×4): 50 mg via ORAL
  Filled 2020-01-24 (×6): qty 1

## 2020-01-24 MED ORDER — SODIUM CHLORIDE 0.9 % IV SOLN
2.0000 g | INTRAVENOUS | Status: DC
  Filled 2020-01-24: qty 20

## 2020-01-24 MED ORDER — MAGNESIUM SULFATE 2 GM/50ML IV SOLN
2.0000 g | Freq: Once | INTRAVENOUS | Status: AC
  Administered 2020-01-24: 2 g via INTRAVENOUS
  Filled 2020-01-24: qty 50

## 2020-01-24 MED ORDER — ACETAMINOPHEN 325 MG PO TABS
650.0000 mg | ORAL_TABLET | Freq: Four times a day (QID) | ORAL | Status: DC | PRN
  Administered 2020-01-24 – 2020-01-28 (×10): 650 mg via ORAL
  Filled 2020-01-24 (×10): qty 2

## 2020-01-24 MED ORDER — SPIRONOLACTONE 25 MG PO TABS: 25.0000 mg | ORAL_TABLET | Freq: Every day | ORAL | Status: DC

## 2020-01-24 MED ORDER — METOPROLOL TARTRATE 12.5 MG HALF TABLET
12.5000 mg | ORAL_TABLET | Freq: Two times a day (BID) | ORAL | Status: DC
  Administered 2020-01-24 – 2020-01-29 (×10): 12.5 mg via ORAL
  Filled 2020-01-24 (×11): qty 1

## 2020-01-24 MED ORDER — SODIUM CHLORIDE 0.9 % IV SOLN: INTRAVENOUS | Status: DC

## 2020-01-24 MED ORDER — ACETAMINOPHEN 650 MG RE SUPP: 650.0000 mg | Freq: Four times a day (QID) | RECTAL | Status: DC | PRN

## 2020-01-24 MED ORDER — SPIRONOLACTONE 12.5 MG HALF TABLET: 12.5000 mg | ORAL_TABLET | Freq: Every day | ORAL | Status: DC

## 2020-01-24 MED ORDER — ALBUTEROL SULFATE (2.5 MG/3ML) 0.083% IN NEBU: 2.5000 mg | INHALATION_SOLUTION | RESPIRATORY_TRACT | Status: DC | PRN

## 2020-01-24 MED ORDER — GUAIFENESIN ER 600 MG PO TB12
600.0000 mg | ORAL_TABLET | Freq: Two times a day (BID) | ORAL | Status: DC
  Administered 2020-01-24 – 2020-01-29 (×12): 600 mg via ORAL
  Filled 2020-01-24 (×12): qty 1

## 2020-01-24 MED ORDER — DEXAMETHASONE SODIUM PHOSPHATE 10 MG/ML IJ SOLN: 6.0000 mg | INTRAMUSCULAR | Status: DC

## 2020-01-24 MED ORDER — GUAIFENESIN-DM 100-10 MG/5ML PO SYRP
5.0000 mL | ORAL_SOLUTION | ORAL | Status: DC | PRN
  Administered 2020-01-24 – 2020-01-28 (×6): 5 mL via ORAL
  Filled 2020-01-24 (×6): qty 10

## 2020-01-24 NOTE — H&P (Addendum)
History and Physical    Robin Davis XFG:[id number] DOB: 1980-09-21 DOA: 01/23/2020  PCP: Burtis Junes, MD (Inactive)   Patient coming from: Home  I have personally briefly reviewed patient's old medical records in [facility]  Chief Complaint: Worsening dyspnea.  HPI: Robin Davis is a 39 y.o. female with history of obesity managed with phentermine through wellness/wt loss clinic, HTN, prediabetes and recurrent hypokalemia who presented through EMS with worsening dyspnea.   Patient diagnosed with Covid 01/16/2020 after developed rhinorrhea only. She was quarantining at home when developed body aches , chills and fever managed with tylenol at home.   Since then she reports developing dry cough and worsening dyspnea.  This is her fourth visit to the ED.  Last seen in the ED 01/22/2020 and received Solu-Medrol 125, REGEN-COV IV infusion.  Patient was not hypoxic therefore discharged on Augmentin/azithromycin with outpatient follow-up at the time.  She called EMS for SPO2 in the high 70s after checking it on her pulse oximeter. EMS confirmed the reading. Fever of 101 F.   She denies any chest pain, palpitations, orthopnea or PND. No history of Covid vaccination.  She is in a relationship with two grown up children. Works as a Social worker. Never smoked. Remote etoh use. No recent recreational drug use.    ED Course:  119/71, pulse 109 bpm, RR 15-26, SPO2 92% requiring 3 L nasal cannula T-max in ED was 99.1  Labs with sodium 134, potassium 2.9, BUN less than 5 LDH 257 Fibrinogen 576 CRP 19, ferritin 191, procalcitonin less than 0.10 D-dimer 0.41 Lactic acid 1.6   -WBC 8.2, Hb 13.4, platelets 264  -CXR with worsening disease at bases concerning for Covid pneumonia -EKG with sinus tachycardia at 116 bpm.  Nonspecific ST-T changes.  Prolonged QTC of 400 592 ms.  -ED meds: Remdesivir Decadron 10 mg Potassium chloride 10 mEq IV once   Review of  Systems: As per HPI otherwise 10 point review of systems negative.   Review of Systems  Constitutional: Positive for chills, fever and malaise/fatigue. Negative for diaphoresis and weight loss.  HENT: Negative.   Eyes: Negative.   Respiratory: Positive for cough, shortness of breath and wheezing. Negative for hemoptysis and sputum production.   Cardiovascular: Negative.   Gastrointestinal: Negative.   Genitourinary: Negative.   Musculoskeletal: Positive for myalgias.  Skin: Negative.   Neurological: Negative.   Endo/Heme/Allergies: Negative.   Psychiatric/Behavioral: Negative.      Past Medical History:  Diagnosis Date  . Anxiety   . COVID-19   . Foot pain   . Hypertension   . Obesity     Past Surgical History:  Procedure Laterality Date  . BREAST REDUCTION SURGERY    . CHOLECYSTECTOMY       reports that she has never smoked. She has never used smokeless tobacco. She reports current alcohol use. She reports that she does not use drugs.  Allergies  Allergen Reactions  . Fish Allergy Shortness Of Breath and Swelling  . Lisinopril Itching, Swelling and Cough  . Nitrofurantoin Macrocrystal Shortness Of Breath and Other (See Comments)    dizziness Other reaction(s): Other (See Comments) dizziness  . Tramadol Shortness Of Breath and Other (See Comments)    sweating  . Flagyl [Metronidazole Hcl] Other (See Comments)    Unknown, but patient thinks is causes dizziness & sweating, possible shortness of breath  . Hydrocodone-Acetaminophen Other (See Comments)    Other reaction(s): Nausea And Vomiting dizziness  . Vicodin [  Hydrocodone-Acetaminophen] Nausea And Vomiting and Other (See Comments)    dizziness    Family History  Problem Relation Age of Onset  . Hypertension Other   . Diabetes Other     Prior to Admission medications   Medication Sig Start Date End Date Taking? Authorizing Provider  albuterol (PROVENTIL HFA;VENTOLIN HFA) 108 (90 BASE) MCG/ACT inhaler  Inhale 1-2 puffs into the lungs every 6 (six) hours as needed for wheezing or shortness of breath. 04/15/14   Elpidio Anis, PA-C  amLODipine (NORVASC) 10 MG tablet Take 10 mg by mouth daily. 12/28/19   [provider]  amoxicillin-clavulanate (AUGMENTIN) 875-125 MG tablet Take 1 tablet by mouth every 12 (twelve) hours. 01/22/20   Gerhard Munch, MD  azithromycin (ZITHROMAX) 250 MG tablet Take 1 tablet (250 mg total) by mouth daily for 4 days. Take 1 every day until finished. 01/22/20 01/26/20  Gerhard Munch, MD  budesonide (PULMICORT) 180 MCG/ACT inhaler Inhale 1 puff into the lungs in the morning and at bedtime. 01/22/20   Lorelee New, PA-C  cetirizine (ZYRTEC ALLERGY) 10 MG tablet Take 1 tablet (10 mg total) by mouth daily. 01/16/20   Particia Nearing, PA-C  fluticasone [facility]) 50 MCG/ACT nasal spray Place 1 spray into both nostrils 2 (two) times daily. 01/16/20   Particia Nearing, PA-C  fluvoxaMINE (LUVOX) 50 MG tablet Take 50 mg by mouth daily. 10/15/15   [provider]  levonorgestrel (MIRENA) 20 MCG/24HR IUD 1 each by Intrauterine route once. Implanted October 2014    [provider]  losartan-hydrochlorothiazide (HYZAAR) 100-12.5 MG tablet Take 1 tablet by mouth daily. 10/29/15   [provider]  meloxicam (MOBIC) 7.5 MG tablet Take 7.5 mg by mouth daily.    [provider]  phenol (CHLORASEPTIC) 1.4 % LIQD Use as directed 1 spray in the mouth or throat as needed for throat irritation / pain. 05/13/18   Fawze, Mina A, PA-C  phentermine (ADIPEX-P) 37.5 MG tablet Take 37.5 mg by mouth daily. 03/13/14   [provider]  pseudoephedrine (SUDAFED 12 HOUR) 120 MG 12 hr tablet Take 1 tablet (120 mg total) by mouth 2 (two) times daily. 04/06/17   Wallis Bamberg, PA-C    Physical Exam: Vitals:   01/23/20 2324 01/23/20 2328 01/24/20 0045  BP: 133/74  119/71  Pulse: (!) 116  (!) 109  Resp: (!) 26  15  Temp: 99.1 F (37.3 C)    TempSrc:  Oral    SpO2: 92%  92%  Weight:  99 kg   Height:  5\' 6"  (1.676 m)     Constitutional: NAD, calm, comfortable Vitals:   01/23/20 2324 01/23/20 2328 01/24/20 0045  BP: 133/74  119/71  Pulse: (!) 116  (!) 109  Resp: (!) 26  15  Temp: 99.1 F (37.3 C)    TempSrc: Oral    SpO2: 92%  92%  Weight:  99 kg   Height:  5\' 6"  (1.676 m)    Eyes: PERRL, lids and conjunctivae normal ENMT: Mucous membranes are moist. Posterior pharynx clear of any exudate or lesions.Normal dentition.  Neck: normal, supple, no masses, no thyromegaly  Respiratory: clear to auscultation bilaterally, no wheezing, no crackles. Normal respiratory effort. No accessory muscle use.  Cardiovascular: Tachycardic but regular rate and rhythm, no murmurs / rubs / gallops. No extremity edema. 2+ pedal pulses. No carotid bruits.   Abdomen: no tenderness, no masses palpated. No hepatosplenomegaly. Bowel sounds positive.  Musculoskeletal: no clubbing / cyanosis. No joint  deformity upper and lower extremities. Good ROM, no contractures. Normal muscle tone.  Skin: no rashes, lesions, ulcers. No induration Neurologic: CN 2-12 grossly intact. Sensation intact, DTR normal. Strength 5/5 in all 4.  Psychiatric: Normal judgment and insight. Alert and oriented x 3. Normal mood.    Labs on Admission: I have personally reviewed following labs and imaging studies  CBC: Recent Labs  Lab 01/21/20 1935 01/22/20 2131 01/23/20 2357  WBC 3.8* 4.8 8.2  NEUTROABS  --  2.7 5.7  HGB 14.1 14.2 13.4  HCT 42.3 42.5 40.1  MCV 83.6 82.5 81.7  PLT 283 253 264   Basic Metabolic Panel: Recent Labs  Lab 01/21/20 1935 01/22/20 2131 01/23/20 2357  NA 134* 134* 134*  K 3.2* 3.3* 2.9*  CL 95* 94* 96*  CO2 28 28 25   GLUCOSE 140* 159* 204*  BUN 5* 7 <5*  CREATININE 0.80 0.74 0.69  CALCIUM 9.2 9.0 8.7*   GFR: Estimated Creatinine Clearance: 112.1 mL/min (by C-G formula based on SCr of 0.69 mg/dL). Liver Function Tests: Recent Labs  Lab  01/22/20 2131 01/23/20 2357  AST 33 39  ALT 34 35  ALKPHOS 64 58  BILITOT 0.3 0.6  PROT 7.5 7.6  ALBUMIN 4.0 3.6   No results for input(s): LIPASE, AMYLASE in the last 168 hours. No results for input(s): AMMONIA in the last 168 hours. Coagulation Profile: No results for input(s): INR, PROTIME in the last 168 hours. Cardiac Enzymes: No results for input(s): CKTOTAL, CKMB, CKMBINDEX, TROPONINI in the last 168 hours. BNP (last 3 results) No results for input(s): PROBNP in the last 8760 hours. HbA1C: No results for input(s): HGBA1C in the last 72 hours. CBG: No results for input(s): GLUCAP in the last 168 hours. Lipid Profile: Recent Labs    01/23/20 2357  TRIG 84   Thyroid Function Tests: No results for input(s): TSH, T4TOTAL, FREET4, T3FREE, THYROIDAB in the last 72 hours. Anemia Panel: Recent Labs    01/23/20 2357  FERRITIN 191   Urine analysis:    Component Value Date/Time   COLORURINE STRAW (A) 01/22/2020 2015   APPEARANCEUR CLEAR 01/22/2020 2015   LABSPEC 1.004 (L) 01/22/2020 2015   PHURINE 7.0 01/22/2020 2015   GLUCOSEU NEGATIVE 01/22/2020 2015   HGBUR NEGATIVE 01/22/2020 2015   BILIRUBINUR NEGATIVE 01/22/2020 2015   KETONESUR NEGATIVE 01/22/2020 2015   PROTEINUR NEGATIVE 01/22/2020 2015   UROBILINOGEN 1.0 08/20/2014 2001   NITRITE NEGATIVE 01/22/2020 2015   LEUKOCYTESUR NEGATIVE 01/22/2020 2015    Radiological Exams on Admission: DG Chest Port 1 View  Result Date: 01/22/2020 CLINICAL DATA:  Shortness of breath EXAM: PORTABLE CHEST 1 VIEW COMPARISON:  01/21/2020 FINDINGS: There is a new airspace opacity at the left lung base. The heart size is unremarkable. There is no pneumothorax. No large pleural effusion. No acute osseous abnormality. IMPRESSION: New airspace opacity at the left lung base compatible with pneumonia. Electronically Signed   By: Katherine Mantlehristopher  Green M.D.   On: 01/22/2020 21:18    EKG: Independently reviewed.  Sinus tachycardia at 116 bpm,  normal axis, no significant ST-T wave changes.  Prolonged QTC of 592 ms.  Assessment/Plan Active Problems:   Pneumonia due to COVID-19 virus   Depression   Hypokalemia   Acute on chronic respiratory failure with hypoxia (HCC)    #Covid pneumonia with hypoxic respiratory failure requiring 3.5 L nasal cannula -CXR with bilateral infiltrates however no evidence of respiratory distress.  -Given hypoxia patient qualifies for remdesivir as well as  Decadron. -She has already received REGEN-COV IV outpatient infusion. -Not all inflammatory markers elevated so may not meet criteria for actemra at this point.  -Will c/w Unasyn/azithromycin per pt request.   #SIRS from above  #HTN -Continue with losartan and Norvasc.  #Hypokalemia 2.9 with prolonged QTC of 592 ms -We will replace potassium.  Magnesium level pending.  Repeat EKG in a.m. -HCTZ may be substituted for aldactone on discharge given recurrent hypokalemia. She denies any h/o bulimia.   #DM a1c 6.7%  -Sliding scale coverage.   #Depression -Continue with fluvoxamine. Holding phentermine since takes that for weight loss.   DVT prophylaxis: Lovenox.    Code Status: Full code Family Communication: None.   Disposition Plan: Home.   Consults called: None.  Admission status: Inpatient on MedSurg floor with telemetry.   Brandyce Dimario MD [facility]   If 7PM-7AM, please contact night-coverage [email or web address] Password [email or web address]  01/24/2020, 1:46 AM

## 2020-01-24 NOTE — ED Notes (Signed)
Breakfast tray delivered

## 2020-01-24 NOTE — Progress Notes (Addendum)
Robin Davis  TKW:[id number] DOB: [birth year] DOA: 01/23/2020 PCP: Burtis Junes, MD (Inactive)    Brief Narrative:  39 year old with a history of obesity managed with phentermine via a weight loss clinic, HTN, prediabetes, and recurrent hyperkalemia who presented to the ED via EMS with progressively worsening dyspnea.  She was diagnosed with Covid 01/16/2020.  She was quarantining at home but then developed severe diffuse body aches chills and progressive dyspnea.  She has been seen in the ED on multiple occasions since her initial diagnosis and received a monoclonal antibody infusion 9/12.  She summoned EMS to her house on the date of this admission when she found her oxygen saturations to be 70%.  CXR noted bilateral infiltrates consistent with Covid.  Significant Events:  9/6 diagnosed Covid positive 9/12 monoclonal antibody per ED 9/14 admit via [facility] ED  Date of Positive COVID Test:  9/6  Vaccination Status: Not vaccinated  COVID-19 specific Treatment: Steroid 9/13 > Remdesivir 9/13 >  Antimicrobials:  Azithromycin 9/12 and 9/14  DVT prophylaxis: Lovenox  Subjective: Pt seen for a f/u visit.   Assessment & Plan:  COVID Pneumonia -acute hypoxic respiratory failure Continue remdesivir and systemic steroids - no clinical evidence to raise concern for bacterial infection therefore antibiotics being discontinued - no clinical indication for IL6 or JAK blockade at present   Recent Labs  Lab 01/22/20 2131 01/23/20 2357 01/24/20 0443  DDIMER  --  0.41  --   FERRITIN  --  191  --   CRP  --  19.0*  --   ALT 34 35 36  PROCALCITON  --  <0.10  --     Sinus tachycardia  ?dehydration - d-dimer not c/w PE - no evidence of chronic BB use - ?due to phentermine   Hypokalemia Corrected with supplementation  HTN  Prediabetes > DM 2 A1c 6.7 in setting of recent steroid use -formerly meets criteria for diagnosis of DM at this time  Morbid obesity    Depression   Code Status: FULL CODE Family Communication:  Status is: Inpatient  Remains inpatient appropriate because:Inpatient level of care appropriate due to severity of illness   Dispo: The patient is from: Home              Anticipated d/c is to: Home              Anticipated d/c date is: 2 days              Patient currently is not medically stable to d/c.    Consultants:  none  Objective: Blood pressure (!) 130/91, pulse (!) 115, temperature 99.1 F (37.3 C), temperature source Oral, resp. rate (!) 26, height 5\' 6"  (1.676 m), weight 99 kg, SpO2 94 %. No intake or output data in the 24 hours ending 01/24/20 0949 Filed Weights   01/23/20 2328  Weight: 99 kg    Examination: Pt seen for a f/u visit   CBC: Recent Labs  Lab 01/22/20 2131 01/23/20 2357 01/24/20 0443  WBC 4.8 8.2 6.7  NEUTROABS 2.7 5.7  --   HGB 14.2 13.4 11.2*  HCT 42.5 40.1 33.3*  MCV 82.5 81.7 83.0  PLT 253 264 218   Basic Metabolic Panel: Recent Labs  Lab 01/22/20 2131 01/23/20 2357 01/24/20 0207 01/24/20 0443  NA 134* 134*  --  134*  K 3.3* 2.9*  --  4.5  CL 94* 96*  --  100  CO2 28 25  --  24  GLUCOSE 159* 204*  --  195*  BUN 7 <5*  --  <5*  CREATININE 0.74 0.69  --  0.69  CALCIUM 9.0 8.7*  --  8.1*  MG  --   --  1.9  --    GFR: Estimated Creatinine Clearance: 112.1 mL/min (by C-G formula based on SCr of 0.69 mg/dL).  Liver Function Tests: Recent Labs  Lab 01/22/20 2131 01/23/20 2357 01/24/20 0443  AST 33 39 50*  ALT 34 35 36  ALKPHOS 64 58 55  BILITOT 0.3 0.6 1.1  PROT 7.5 7.6 7.0  ALBUMIN 4.0 3.6 3.4*    HbA1C: Hgb A1c MFr Bld  Date/Time Value Ref Range Status  01/24/2020 02:07 AM 6.7 (H) 4.8 - 5.6 % Final    Comment:    (NOTE) Pre diabetes:          5.7%-6.4%  Diabetes:              >6.4%  Glycemic control for   <7.0% adults with diabetes     CBG: Recent Labs  Lab 01/24/20 0739  GLUCAP 176*    Recent Results (from the past 240 hour(s))   SARS CORONAVIRUS 2 (TAT 6-24 HRS) Nasopharyngeal Nasopharyngeal Swab     Status: Abnormal   Collection Time: 01/16/20  5:36 PM   Specimen: Nasopharyngeal Swab  Result Value Ref Range Status   SARS Coronavirus 2 POSITIVE (A) NEGATIVE Final    Comment: (NOTE) SARS-CoV-2 target nucleic acids are DETECTED.  The SARS-CoV-2 RNA is generally detectable in upper and lower respiratory specimens during the acute phase of infection. Positive results are indicative of the presence of SARS-CoV-2 RNA. Clinical correlation with patient history and other diagnostic information is  necessary to determine patient infection status. Positive results do not rule out bacterial infection or co-infection with other viruses.  The expected result is Negative.  Fact Sheet for Patients: HairSlick.no  Fact Sheet for Healthcare Providers: quierodirigir.com  This test is not yet approved or cleared by the [place] FDA and  has been authorized for detection and/or diagnosis of SARS-CoV-2 by FDA under an Emergency Use Authorization (EUA). This EUA will remain  in effect (meaning this test can be used) for the duration of the COVID-19 declaration under Section 564(b)(1) of the Act, 21 U. S.C. section 360bbb-3(b)(1), unless the authorization is terminated or revoked sooner.   Performed at [facility], [address], [place], [place] [address]      Scheduled Meds: . amLODipine  10 mg Oral Daily  . dexamethasone (DECADRON) injection  6 mg Intravenous Q24H  . enoxaparin (LOVENOX) injection  40 mg Subcutaneous Q24H  . fluvoxaMINE  50 mg Oral Daily  . guaiFENesin  600 mg Oral BID  . insulin aspart  0-15 Units Subcutaneous TID WC  . loratadine  10 mg Oral Daily  . losartan  100 mg Oral Daily  . spironolactone  12.5 mg Oral Daily   Continuous Infusions: . sodium chloride 100 mL/hr at 01/24/20 0248  . azithromycin Stopped (01/24/20 4010)  .  cefTRIAXone (ROCEPHIN)  IV    . remdesivir 100 mg in NS 100 mL       LOS: 0 days   Lonia Blood, MD [facility] Office  [phone] Pager - Text Page per Amion  If 7PM-7AM, please contact night-coverage per Amion 01/24/2020, 9:49 AM

## 2020-01-25 DIAGNOSIS — F320 Major depressive disorder, single episode, mild: Secondary | ICD-10-CM

## 2020-01-25 LAB — COMPREHENSIVE METABOLIC PANEL
ALT: 36 U/L (ref 0–44)
AST: 32 U/L (ref 15–41)
Albumin: 3.6 g/dL (ref 3.5–5.0)
Alkaline Phosphatase: 60 U/L (ref 38–126)
Anion gap: 9 (ref 5–15)
BUN: 10 mg/dL (ref 6–20)
CO2: 25 mmol/L (ref 22–32)
Calcium: 9.3 mg/dL (ref 8.9–10.3)
Chloride: 104 mmol/L (ref 98–111)
Creatinine, Ser: 0.69 mg/dL (ref 0.44–1.00)
GFR calc Af Amer: >60 mL/min (ref >60)
GFR calc non Af Amer: >60 mL/min (ref >60)
Glucose, Bld: 198 mg/dL — ABNORMAL HIGH (ref 70–99)
Potassium: 4.2 mmol/L (ref 3.5–5.1)
Sodium: 138 mmol/L (ref 135–145)
Total Bilirubin: 0.6 mg/dL (ref 0.3–1.2)
Total Protein: 8 g/dL (ref 6.5–8.1)

## 2020-01-25 LAB — CBC WITH DIFFERENTIAL/PLATELET
Abs Immature Granulocytes: 0.52 10*3/uL — ABNORMAL HIGH (ref 0.00–0.07)
Basophils Absolute: 0 10*3/uL (ref 0.0–0.1)
Basophils Relative: 0 %
Eosinophils Absolute: 0.1 10*3/uL (ref 0.0–0.5)
Eosinophils Relative: 1 %
HCT: 40.2 % (ref 36.0–46.0)
Hemoglobin: 13.6 g/dL (ref 12.0–15.0)
Immature Granulocytes: 4 %
Lymphocytes Relative: 13 %
Lymphs Abs: 1.6 10*3/uL (ref 0.7–4.0)
MCH: 27.8 pg (ref 26.0–34.0)
MCHC: 33.8 g/dL (ref 30.0–36.0)
MCV: 82 fL (ref 80.0–100.0)
Monocytes Absolute: 0.9 10*3/uL (ref 0.1–1.0)
Monocytes Relative: 7 %
Neutro Abs: 9.2 10*3/uL — ABNORMAL HIGH (ref 1.7–7.7)
Neutrophils Relative %: 75 %
Platelets: 341 10*3/uL (ref 150–400)
RBC: 4.9 MIL/uL (ref 3.87–5.11)
RDW: 13.6 % (ref 11.5–15.5)
WBC: 12.3 10*3/uL — ABNORMAL HIGH (ref 4.0–10.5)
nRBC: 0 % (ref 0.0–0.2)

## 2020-01-25 LAB — CBG MONITORING, ED
Glucose-Capillary: 202 mg/dL — ABNORMAL HIGH (ref 70–99)
Glucose-Capillary: 232 mg/dL — ABNORMAL HIGH (ref 70–99)
Glucose-Capillary: 267 mg/dL — ABNORMAL HIGH (ref 70–99)
Glucose-Capillary: 233 mg/dL — ABNORMAL HIGH (ref 70–99)

## 2020-01-25 LAB — D-DIMER, QUANTITATIVE: D-Dimer, Quant: 0.49 ug/mL-FEU (ref 0.00–0.50)

## 2020-01-25 LAB — FERRITIN: Ferritin: 478 ng/mL — ABNORMAL HIGH (ref 11–307)

## 2020-01-25 LAB — C-REACTIVE PROTEIN: CRP: 12.2 mg/dL — ABNORMAL HIGH (ref <1.0)

## 2020-01-25 NOTE — ED Notes (Signed)
Pt currently lying prone.

## 2020-01-25 NOTE — Progress Notes (Signed)
PROGRESS NOTE    Robin Davis  WPV:[id number] DOB: 1981-04-08 DOA: 01/23/2020 PCP: Burtis Junes, MD (Inactive)     Brief Narrative:  39 year old BF PMHx obesity managed with phentermine via a weight loss clinic, HTN, prediabetes, and recurrent hyperkalemia   who presented to the ED via EMS with progressively worsening dyspnea.  She was diagnosed with Covid 01/16/2020.  She was quarantining at home but then developed severe diffuse body aches chills and progressive dyspnea.  She has been seen in the ED on multiple occasions since her initial diagnosis and received a monoclonal antibody infusion 9/12.  She summoned EMS to her house on the date of this admission when she found her oxygen saturations to be 70%.  CXR noted bilateral infiltrates consistent with Covid.   Subjective: Afebrile overnight positive S OB, negative CP, negative nausea, negative vomiting,   Assessment & Plan: Covid vaccination; no vaccination   Active Problems:   Pneumonia due to COVID-19 virus   Depression   Hypokalemia   Acute on chronic respiratory failure with hypoxia (HCC)   Acute respiratory failure with hypoxia/COVID Pneumonia COVID-19 Labs  Recent Labs    01/23/20 2357 01/25/20 0610  DDIMER 0.41 0.49  FERRITIN 191 478*  LDH 257*  --   CRP 19.0* 12.2*    Lab Results  Component Value Date   SARSCOV2NAA POSITIVE (A) 01/16/2020    Continue remdesivir and systemic steroids - no clinical evidence to raise concern for bacterial infection therefore antibiotics being discontinued - no clinical indication for IL6 or JAK blockade at present   Sinus tachycardia  ?dehydration - d-dimer not c/w PE - no evidence of chronic BB use - ?due to phentermine   Hypokalemia Corrected with supplementation  HTN  Prediabetes > DM 2 A1c 6.7 in setting of recent steroid use -formerly meets criteria for diagnosis of DM at this time  Morbid obesity   Depression      DVT prophylaxis:  Lovenox Code Status: Full Family Communication:  Status is: Inpatient    Dispo: The patient is from: Home              Anticipated d/c is to: Home              Anticipated d/c date is: 9/20              Patient currently unstable      Consultants:    Procedures/Significant Events:    I have personally reviewed and interpreted all radiology studies and my findings are as above.  VENTILATOR SETTINGS: Nasal cannula 9/15 Flow; 4 L/min SPO2 98%   Cultures   Antimicrobials: Anti-infectives (From admission, onward)   Start     Dose/Rate Route Frequency Ordered Stop   01/24/20 1000  remdesivir 100 mg in sodium chloride 0.9 % 100 mL IVPB       "Followed by" Linked Group Details   100 mg 200 mL/hr over 30 Minutes Intravenous Daily 01/23/20 2342 01/28/20 0959   01/24/20 0800  cefTRIAXone (ROCEPHIN) 2 g in sodium chloride 0.9 % 100 mL IVPB  Status:  Discontinued        2 g 200 mL/hr over 30 Minutes Intravenous Every 24 hours 01/24/20 0734 01/24/20 0959   01/24/20 0800  azithromycin (ZITHROMAX) 500 mg in sodium chloride 0.9 % 250 mL IVPB  Status:  Discontinued        500 mg 250 mL/hr over 60 Minutes Intravenous Every 24 hours 01/24/20 0734 01/24/20 0959   01/23/20  2345  remdesivir 200 mg in sodium chloride 0.9% 250 mL IVPB       "Followed by" Linked Group Details   200 mg 580 mL/hr over 30 Minutes Intravenous Once 01/23/20 2342 01/24/20 0100       Devices    LINES / TUBES:      Continuous Infusions: . remdesivir 100 mg in NS 100 mL 100 mg (01/25/20 0928)     Objective: Vitals:   01/25/20 0600 01/25/20 0625 01/25/20 0906 01/25/20 1130  BP: 104/63  (!) 116/91 120/84  Pulse: 90 100 (!) 107 93  Resp: (!) 23 (!) 28 (!) 28 17  Temp:  98.4 F (36.9 C) 98.3 F (36.8 C)   TempSrc:  Oral Oral   SpO2: 92% 90% 91% 94%  Weight:      Height:       No intake or output data in the 24 hours ending 01/25/20 1247 Filed Weights   01/23/20 2328  Weight: 99 kg     Examination:  General: A/O x4, positive acute respiratory distress Eyes: negative scleral hemorrhage, negative anisocoria, negative icterus ENT: Negative Runny nose, negative gingival bleeding, Neck:  Negative scars, masses, torticollis, lymphadenopathy, JVD Lungs Little to no breath sounds bilaterally without wheezes or crackles Cardiovascular: Regular rate and rhythm without murmur gallop or rub normal S1 and S2 Abdomen: OBESE, negative abdominal pain, nondistended, positive soft, bowel sounds, no rebound, no ascites, no appreciable mass Extremities: No significant cyanosis, clubbing, or edema bilateral lower extremities Skin: Negative rashes, lesions, ulcers Psychiatric:  Negative depression, negative anxiety, negative fatigue, negative mania  Central nervous system:  Cranial nerves II through XII intact, tongue/uvula midline, all extremities muscle strength 5/5, sensation intact throughout, negative dysarthria, negative expressive aphasia, negative receptive aphasia.  .     Data Reviewed: Care during the described time interval was provided by me .  I have reviewed this patient's available data, including medical history, events of note, physical examination, and all test results as part of my evaluation.  CBC: Recent Labs  Lab 01/21/20 1935 01/22/20 2131 01/23/20 2357 01/24/20 0443 01/25/20 0610  WBC 3.8* 4.8 8.2 6.7 12.3*  NEUTROABS  --  2.7 5.7  --  9.2*  HGB 14.1 14.2 13.4 11.2* 13.6  HCT 42.3 42.5 40.1 33.3* 40.2  MCV 83.6 82.5 81.7 83.0 82.0  PLT 283 253 264 218 341   Basic Metabolic Panel: Recent Labs  Lab 01/21/20 1935 01/22/20 2131 01/23/20 2357 01/24/20 0207 01/24/20 0443 01/25/20 0610  NA 134* 134* 134*  --  134* 138  K 3.2* 3.3* 2.9*  --  4.5 4.2  CL 95* 94* 96*  --  100 104  CO2 28 28 25   --  24 25  GLUCOSE 140* 159* 204*  --  195* 198*  BUN 5* 7 <5*  --  <5* 10  CREATININE 0.80 0.74 0.69  --  0.69 0.69  CALCIUM 9.2 9.0 8.7*  --  8.1* 9.3  MG   --   --   --  1.9  --   --    GFR: Estimated Creatinine Clearance: 112.1 mL/min (by C-G formula based on SCr of 0.69 mg/dL). Liver Function Tests: Recent Labs  Lab 01/22/20 2131 01/23/20 2357 01/24/20 0443 01/25/20 0610  AST 33 39 50* 32  ALT 34 35 36 36  ALKPHOS 64 58 55 60  BILITOT 0.3 0.6 1.1 0.6  PROT 7.5 7.6 7.0 8.0  ALBUMIN 4.0 3.6 3.4* 3.6   No results for  input(s): LIPASE, AMYLASE in the last 168 hours. No results for input(s): AMMONIA in the last 168 hours. Coagulation Profile: No results for input(s): INR, PROTIME in the last 168 hours. Cardiac Enzymes: No results for input(s): CKTOTAL, CKMB, CKMBINDEX, TROPONINI in the last 168 hours. BNP (last 3 results) No results for input(s): PROBNP in the last 8760 hours. HbA1C: Recent Labs    01/24/20 0207  HGBA1C 6.7*   CBG: Recent Labs  Lab 01/24/20 1205 01/24/20 1712 01/24/20 2152 01/25/20 0903 01/25/20 1152  GLUCAP 147* 164* 216* 202* 232*   Lipid Profile: Recent Labs    01/23/20 2357  TRIG 84   Thyroid Function Tests: Recent Labs    01/24/20 0200  TSH 3.657   Anemia Panel: Recent Labs    01/23/20 2357 01/25/20 0610  FERRITIN 191 478*   Sepsis Labs: Recent Labs  Lab 01/22/20 2131 01/23/20 2357  PROCALCITON  --  <0.10  LATICACIDVEN 1.7 1.6    Recent Results (from the past 240 hour(s))  SARS CORONAVIRUS 2 (TAT 6-24 HRS) Nasopharyngeal Nasopharyngeal Swab     Status: Abnormal   Collection Time: 01/16/20  5:36 PM   Specimen: Nasopharyngeal Swab  Result Value Ref Range Status   SARS Coronavirus 2 POSITIVE (A) NEGATIVE Final    Comment: (NOTE) SARS-CoV-2 target nucleic acids are DETECTED.  The SARS-CoV-2 RNA is generally detectable in upper and lower respiratory specimens during the acute phase of infection. Positive results are indicative of the presence of SARS-CoV-2 RNA. Clinical correlation with patient history and other diagnostic information is  necessary to determine patient  infection status. Positive results do not rule out bacterial infection or co-infection with other viruses.  The expected result is Negative.  Fact Sheet for Patients: HairSlick.nohttps://www.fda.gov/media/138098/download  Fact Sheet for Healthcare Providers: quierodirigir.comhttps://www.fda.gov/media/138095/download  This test is not yet approved or cleared by the [place] FDA and  has been authorized for detection and/or diagnosis of SARS-CoV-2 by FDA under an Emergency Use Authorization (EUA). This EUA will remain  in effect (meaning this test can be used) for the duration of the COVID-19 declaration under Section 564(b)(1) of the Act, 21 U. S.C. section 360bbb-3(b)(1), unless the authorization is terminated or revoked sooner.   Performed at [facility], [address] [place], [place] [id number]          Radiology Studies: DG Chest Port 1 View  Result Date: 01/23/2020 CLINICAL DATA:  Shortness of breath, COVID EXAM: PORTABLE CHEST 1 VIEW COMPARISON:  01/22/2020 FINDINGS: Worsening airspace disease at the bases. No pleural effusion. Stable cardiomediastinal silhouette. No pneumothorax IMPRESSION: Worsening airspace disease at the bases concerning for pneumonia. Electronically Signed   By: Jasmine PangKim  Fujinaga M.D.   On: 01/23/2020 23:53        Scheduled Meds: . amLODipine  10 mg Oral Daily  . budesonide  1 puff Inhalation BID  . enoxaparin (LOVENOX) injection  40 mg Subcutaneous Q24H  . fluvoxaMINE  50 mg Oral Daily  . guaiFENesin  600 mg Oral BID  . insulin aspart  0-15 Units Subcutaneous TID WC  . loratadine  10 mg Oral Daily  . losartan  100 mg Oral Daily  . methylPREDNISolone (SOLU-MEDROL) injection  50 mg Intravenous Q12H   Followed by  . [START ON 01/27/2020] predniSONE  50 mg Oral Daily  . metoprolol tartrate  12.5 mg Oral BID   Continuous Infusions: . remdesivir 100 mg in NS 100 mL 100 mg (01/25/20 0928)     LOS: 1  day    Time spent:40 min    Deamonte Sayegh, Roselind Messier, MD [facility] Pager [phone]  If 7PM-7AM, please contact night-coverage [email or web address] Password [facility] 01/25/2020, 12:47 PM

## 2020-01-25 NOTE — Plan of Care (Signed)
  Problem: Education: Goal: Knowledge of General Education information will improve Description: Including pain rating scale, medication(s)/side effects and non-pharmacologic comfort measures 01/25/2020 2258 by Cheron Schaumann, RN Outcome: Progressing 01/25/2020 2257 by Cheron Schaumann, RN Outcome: Progressing   Problem: Health Behavior/Discharge Planning: Goal: Ability to manage health-related needs will improve 01/25/2020 2258 by Cheron Schaumann, RN Outcome: Progressing 01/25/2020 2257 by Cheron Schaumann, RN Outcome: Progressing   Problem: Clinical Measurements: Goal: Ability to maintain clinical measurements within normal limits will improve 01/25/2020 2258 by Cheron Schaumann, RN Outcome: Progressing 01/25/2020 2257 by Cheron Schaumann, RN Outcome: Progressing Goal: Will remain free from infection 01/25/2020 2258 by Cheron Schaumann, RN Outcome: Progressing 01/25/2020 2257 by Cheron Schaumann, RN Outcome: Progressing Goal: Diagnostic test results will improve 01/25/2020 2258 by Cheron Schaumann, RN Outcome: Progressing 01/25/2020 2257 by Cheron Schaumann, RN Outcome: Progressing Goal: Respiratory complications will improve 01/25/2020 2258 by Cheron Schaumann, RN Outcome: Progressing 01/25/2020 2257 by Cheron Schaumann, RN Outcome: Progressing Goal: Cardiovascular complication will be avoided 01/25/2020 2258 by Cheron Schaumann, RN Outcome: Progressing 01/25/2020 2257 by Cheron Schaumann, RN Outcome: Progressing   Problem: Activity: Goal: Risk for activity intolerance will decrease 01/25/2020 2258 by Cheron Schaumann, RN Outcome: Progressing 01/25/2020 2257 by Cheron Schaumann, RN Outcome: Progressing   Problem: Nutrition: Goal: Adequate nutrition will be maintained 01/25/2020 2258 by Cheron Schaumann, RN Outcome: Progressing 01/25/2020 2257 by Cheron Schaumann, RN Outcome: Progressing   Problem: Coping: Goal: Level of anxiety will decrease 01/25/2020  2258 by Cheron Schaumann, RN Outcome: Progressing 01/25/2020 2257 by Cheron Schaumann, RN Outcome: Progressing   Problem: Elimination: Goal: Will not experience complications related to bowel motility 01/25/2020 2258 by Cheron Schaumann, RN Outcome: Progressing 01/25/2020 2257 by Cheron Schaumann, RN Outcome: Progressing Goal: Will not experience complications related to urinary retention 01/25/2020 2258 by Cheron Schaumann, RN Outcome: Progressing 01/25/2020 2257 by Cheron Schaumann, RN Outcome: Progressing   Problem: Pain Managment: Goal: General experience of comfort will improve 01/25/2020 2258 by Cheron Schaumann, RN Outcome: Progressing 01/25/2020 2257 by Cheron Schaumann, RN Outcome: Progressing   Problem: Safety: Goal: Ability to remain free from injury will improve 01/25/2020 2258 by Cheron Schaumann, RN Outcome: Progressing 01/25/2020 2257 by Cheron Schaumann, RN Outcome: Progressing   Problem: Skin Integrity: Goal: Risk for impaired skin integrity will decrease 01/25/2020 2258 by Cheron Schaumann, RN Outcome: Progressing 01/25/2020 2257 by Cheron Schaumann, RN Outcome: Progressing   Problem: Education: Goal: Knowledge of risk factors and measures for prevention of condition will improve 01/25/2020 2258 by Cheron Schaumann, RN Outcome: Progressing 01/25/2020 2257 by Cheron Schaumann, RN Outcome: Progressing   Problem: Coping: Goal: Psychosocial and spiritual needs will be supported 01/25/2020 2258 by Cheron Schaumann, RN Outcome: Progressing 01/25/2020 2257 by Cheron Schaumann, RN Outcome: Progressing   Problem: Respiratory: Goal: Will maintain a patent airway 01/25/2020 2258 by Cheron Schaumann, RN Outcome: Progressing 01/25/2020 2257 by Cheron Schaumann, RN Outcome: Progressing Goal: Complications related to the disease process, condition or treatment will be avoided or minimized 01/25/2020 2258 by Cheron Schaumann, RN Outcome:  Progressing 01/25/2020 2257 by Cheron Schaumann, RN Outcome: Progressing

## 2020-01-26 DIAGNOSIS — R000 Tachycardia, unspecified: Secondary | ICD-10-CM

## 2020-01-26 DIAGNOSIS — I10 Essential (primary) hypertension: Secondary | ICD-10-CM

## 2020-01-26 LAB — CBC WITH DIFFERENTIAL/PLATELET
Abs Immature Granulocytes: 0.77 10*3/uL — ABNORMAL HIGH (ref 0.00–0.07)
Basophils Absolute: 0.1 10*3/uL (ref 0.0–0.1)
Basophils Relative: 1 %
Eosinophils Absolute: 0 10*3/uL (ref 0.0–0.5)
Eosinophils Relative: 0 %
HCT: 41 % (ref 36.0–46.0)
Hemoglobin: 13.3 g/dL (ref 12.0–15.0)
Immature Granulocytes: 5 %
Lymphocytes Relative: 10 %
Lymphs Abs: 1.7 10*3/uL (ref 0.7–4.0)
MCH: 27.1 pg (ref 26.0–34.0)
MCHC: 32.4 g/dL (ref 30.0–36.0)
MCV: 83.5 fL (ref 80.0–100.0)
Monocytes Absolute: 1 10*3/uL (ref 0.1–1.0)
Monocytes Relative: 6 %
Neutro Abs: 13.4 10*3/uL — ABNORMAL HIGH (ref 1.7–7.7)
Neutrophils Relative %: 78 %
Platelets: 414 10*3/uL — ABNORMAL HIGH (ref 150–400)
RBC: 4.91 MIL/uL (ref 3.87–5.11)
RDW: 13.8 % (ref 11.5–15.5)
WBC: 16.9 10*3/uL — ABNORMAL HIGH (ref 4.0–10.5)
nRBC: 0 % (ref 0.0–0.2)

## 2020-01-26 LAB — COMPREHENSIVE METABOLIC PANEL
ALT: 31 U/L (ref 0–44)
AST: 24 U/L (ref 15–41)
Albumin: 3.2 g/dL — ABNORMAL LOW (ref 3.5–5.0)
Alkaline Phosphatase: 58 U/L (ref 38–126)
Anion gap: 9 (ref 5–15)
BUN: 11 mg/dL (ref 6–20)
CO2: 24 mmol/L (ref 22–32)
Calcium: 9.1 mg/dL (ref 8.9–10.3)
Chloride: 104 mmol/L (ref 98–111)
Creatinine, Ser: 0.59 mg/dL (ref 0.44–1.00)
GFR calc Af Amer: >60 mL/min (ref >60)
GFR calc non Af Amer: >60 mL/min (ref >60)
Glucose, Bld: 217 mg/dL — ABNORMAL HIGH (ref 70–99)
Potassium: 3.8 mmol/L (ref 3.5–5.1)
Sodium: 137 mmol/L (ref 135–145)
Total Bilirubin: 0.6 mg/dL (ref 0.3–1.2)
Total Protein: 6.9 g/dL (ref 6.5–8.1)

## 2020-01-26 LAB — PHOSPHORUS: Phosphorus: 3.7 mg/dL (ref 2.5–4.6)

## 2020-01-26 LAB — GLUCOSE, CAPILLARY
Glucose-Capillary: 234 mg/dL — ABNORMAL HIGH (ref 70–99)
Glucose-Capillary: 278 mg/dL — ABNORMAL HIGH (ref 70–99)
Glucose-Capillary: 332 mg/dL — ABNORMAL HIGH (ref 70–99)
Glucose-Capillary: 322 mg/dL — ABNORMAL HIGH (ref 70–99)

## 2020-01-26 LAB — C-REACTIVE PROTEIN: CRP: 4.7 mg/dL — ABNORMAL HIGH (ref <1.0)

## 2020-01-26 LAB — D-DIMER, QUANTITATIVE: D-Dimer, Quant: 0.43 ug/mL-FEU (ref 0.00–0.50)

## 2020-01-26 LAB — FERRITIN: Ferritin: 300 ng/mL (ref 11–307)

## 2020-01-26 LAB — LACTATE DEHYDROGENASE: LDH: 264 U/L — ABNORMAL HIGH (ref 98–192)

## 2020-01-26 LAB — MAGNESIUM: Magnesium: 2.2 mg/dL (ref 1.7–2.4)

## 2020-01-26 MED ORDER — INSULIN GLARGINE 100 UNIT/ML ~~LOC~~ SOLN
12.0000 [IU] | Freq: Every day | SUBCUTANEOUS | Status: DC
  Administered 2020-01-26 – 2020-01-27 (×2): 12 [IU] via SUBCUTANEOUS
  Filled 2020-01-26 (×2): qty 0.12

## 2020-01-26 MED ORDER — ENOXAPARIN SODIUM 60 MG/0.6ML ~~LOC~~ SOLN
50.0000 mg | SUBCUTANEOUS | Status: DC
  Administered 2020-01-27 – 2020-01-29 (×3): 50 mg via SUBCUTANEOUS
  Filled 2020-01-26 (×3): qty 0.6

## 2020-01-26 NOTE — Progress Notes (Signed)
PROGRESS NOTE    Robin Davis  CHE:[id number] DOB: 1981/01/03 DOA: 01/23/2020 PCP: Burtis Junes, MD (Inactive)     Brief Narrative:  39 year old BF PMHx obesity managed with phentermine via a weight loss clinic, HTN, prediabetes, and recurrent hyperkalemia   who presented to the ED via EMS with progressively worsening dyspnea.  She was diagnosed with Covid 01/16/2020.  She was quarantining at home but then developed severe diffuse body aches chills and progressive dyspnea.  She has been seen in the ED on multiple occasions since her initial diagnosis and received a monoclonal antibody infusion 9/12.  She summoned EMS to her house on the date of this admission when she found her oxygen saturations to be 70%.  CXR noted bilateral infiltrates consistent with Covid.   Subjective: 9/16 afebrile overnight positive S OB, negative CP, negative nausea, negative vomiting   Assessment & Plan: Covid vaccination; no vaccination   Active Problems:   Pneumonia due to COVID-19 virus   Depression   Hypokalemia   Acute on chronic respiratory failure with hypoxia (HCC)   Sinus tachycardia   Essential hypertension   Acute respiratory failure with hypoxia/COVID Pneumonia COVID-19 Labs  Recent Labs    01/25/20 0610 01/26/20 0404 01/27/20 0351  DDIMER 0.49 0.43 0.32  FERRITIN 478* 300 289  LDH  --  264* 255*  CRP 12.2* 4.7* 1.8*    Lab Results  Component Value Date   SARSCOV2NAA POSITIVE (A) 01/16/2020   -Remdesivir per pharmacy x5 days -Prednisone 50 mg daily  Sinus tachycardia -D-dimer not consistent with PE -Most likely secondary to patient's phentermine use for weight loss - Essential HTN -Amlodipine 10 mg daily -Losartan 100 mg daily -Metoprolol 12.5 mg BID  Hypokalemia -Resolved  Diabetes type 2 controlled with hyperglycemia -Patient meets guidelines for diabetes hemoglobin A1c> 6.4 -9/14 hemoglobin A1c= 6.7 -Would start patient on Metformin upon  discharge for for now we will control CBG with insulin considering she is also on high-dose steroids. -9/16 Lantus 12 units -Moderate SSI    Morbid obesity   Depression      DVT prophylaxis: Lovenox Code Status: Full Family Communication:  Status is: Inpatient    Dispo: The patient is from: Home              Anticipated d/c is to: Home              Anticipated d/c date is: 9/20              Patient currently unstable      Consultants:    Procedures/Significant Events:    I have personally reviewed and interpreted all radiology studies and my findings are as above.  VENTILATOR SETTINGS: Nasal cannula 9/16 Flow; 4 L/min SPO2 90%   Cultures   Antimicrobials: Anti-infectives (From admission, onward)   Start     Ordered Stop   01/24/20 1000  remdesivir 100 mg in sodium chloride 0.9 % 100 mL IVPB       "Followed by" Linked Group Details   01/23/20 2342 01/28/20 0959   01/24/20 0800  cefTRIAXone (ROCEPHIN) 2 g in sodium chloride 0.9 % 100 mL IVPB  Status:  Discontinued        01/24/20 0734 01/24/20 0959   01/24/20 0800  azithromycin (ZITHROMAX) 500 mg in sodium chloride 0.9 % 250 mL IVPB  Status:  Discontinued        01/24/20 0734 01/24/20 0959   01/23/20 2345  remdesivir 200 mg in sodium chloride  0.9% 250 mL IVPB       "Followed by" Linked Group Details   01/23/20 2342 01/24/20 0100       Devices    LINES / TUBES:      Continuous Infusions: . remdesivir 100 mg in NS 100 mL 100 mg (01/26/20 0919)     Objective: Vitals:   01/26/20 0946 01/26/20 1338 01/26/20 2125 01/27/20 0530  BP: (!) 130/95 140/87 (!) 138/94 (!) 142/92  Pulse: 90 94 92 86  Resp: 18 18 20 16   Temp: 98 F (36.7 C) 98.1 F (36.7 C) 98.6 F (37 C) 98.6 F (37 C)  TempSrc: Oral Oral    SpO2: 93% 94% 94% 98%  Weight:      Height:        Intake/Output Summary (Last 24 hours) at 01/27/2020 0902 Last data filed at 01/27/2020 0820 Gross per 24 hour  Intake 457.44 ml   Output --  Net 457.44 ml   Filed Weights   01/23/20 2328  Weight: 99 kg    Examination:  General: A/O x4, positive acute respiratory distress Eyes: negative scleral hemorrhage, negative anisocoria, negative icterus ENT: Negative Runny nose, negative gingival bleeding, Neck:  Negative scars, masses, torticollis, lymphadenopathy, JVD Lungs Little to no breath sounds bilaterally without wheezes or crackles Cardiovascular: Regular rate and rhythm without murmur gallop or rub normal S1 and S2 Abdomen: OBESE, negative abdominal pain, nondistended, positive soft, bowel sounds, no rebound, no ascites, no appreciable mass Extremities: No significant cyanosis, clubbing, or edema bilateral lower extremities Skin: Negative rashes, lesions, ulcers Psychiatric:  Negative depression, negative anxiety, negative fatigue, negative mania  Central nervous system:  Cranial nerves II through XII intact, tongue/uvula midline, all extremities muscle strength 5/5, sensation intact throughout, negative dysarthria, negative expressive aphasia, negative receptive aphasia.  .     Data Reviewed: Care during the described time interval was provided by me .  I have reviewed this patient's available data, including medical history, events of note, physical examination, and all test results as part of my evaluation.  CBC: Recent Labs  Lab 01/22/20 2131 01/22/20 2131 01/23/20 2357 01/24/20 0443 01/25/20 0610 01/26/20 0404 01/27/20 0351  WBC 4.8   < > 8.2 6.7 12.3* 16.9* 19.8*  NEUTROABS 2.7  --  5.7  --  9.2* 13.4* 14.4*  HGB 14.2   < > 13.4 11.2* 13.6 13.3 13.7  HCT 42.5   < > 40.1 33.3* 40.2 41.0 40.8  MCV 82.5   < > 81.7 83.0 82.0 83.5 82.1  PLT 253   < > 264 218 341 414* 418*   < > = values in this interval not displayed.   Basic Metabolic Panel: Recent Labs  Lab 01/23/20 2357 01/24/20 0207 01/24/20 0443 01/25/20 0610 01/26/20 0404 01/27/20 0351  NA 134*  --  134* 138 137 134*  K 2.9*  --   4.5 4.2 3.8 3.9  CL 96*  --  100 104 104 102  CO2 25  --  24 25 24 24   GLUCOSE 204*  --  195* 198* 217* 246*  BUN <5*  --  <5* 10 11 13   CREATININE 0.69  --  0.69 0.69 0.59 0.62  CALCIUM 8.7*  --  8.1* 9.3 9.1 8.7*  MG  --  1.9  --   --  2.2 2.2  PHOS  --   --   --   --  3.7 3.3   GFR: Estimated Creatinine Clearance: 112.1 mL/min (by C-G formula based  on SCr of 0.62 mg/dL). Liver Function Tests: Recent Labs  Lab 01/23/20 2357 01/24/20 0443 01/25/20 0610 01/26/20 0404 01/27/20 0351  AST 39 50* 32 24 22  ALT 35 36 36 31 46*  ALKPHOS 58 55 60 58 63  BILITOT 0.6 1.1 0.6 0.6 0.7  PROT 7.6 7.0 8.0 6.9 6.9  ALBUMIN 3.6 3.4* 3.6 3.2* 3.3*   No results for input(s): LIPASE, AMYLASE in the last 168 hours. No results for input(s): AMMONIA in the last 168 hours. Coagulation Profile: No results for input(s): INR, PROTIME in the last 168 hours. Cardiac Enzymes: No results for input(s): CKTOTAL, CKMB, CKMBINDEX, TROPONINI in the last 168 hours. BNP (last 3 results) No results for input(s): PROBNP in the last 8760 hours. HbA1C: No results for input(s): HGBA1C in the last 72 hours. CBG: Recent Labs  Lab 01/26/20 0817 01/26/20 1115 01/26/20 1624 01/26/20 2126 01/27/20 0735  GLUCAP 234* 278* 332* 322* 272*   Lipid Profile: No results for input(s): CHOL, HDL, LDLCALC, TRIG, CHOLHDL, LDLDIRECT in the last 72 hours. Thyroid Function Tests: No results for input(s): TSH, T4TOTAL, FREET4, T3FREE, THYROIDAB in the last 72 hours. Anemia Panel: Recent Labs    01/26/20 0404 01/27/20 0351  FERRITIN 300 289   Sepsis Labs: Recent Labs  Lab 01/22/20 2131 01/23/20 2357  PROCALCITON  --  <0.10  LATICACIDVEN 1.7 1.6    Recent Results (from the past 240 hour(s))  Blood Culture (routine x 2)     Status: None (Preliminary result)   Collection Time: 01/23/20 11:57 PM   Specimen: BLOOD  Result Value Ref Range Status   Specimen Description   Final    BLOOD LEFT ANTECUBITAL Performed  at [facility], [address], [place], [place] [address]    Special Requests   Final    BOTTLES DRAWN AEROBIC AND ANAEROBIC Blood Culture adequate volume Performed at [facility], [address] [place], [place] [address]    Culture   Final    NO GROWTH 2 DAYS Performed at [facility], [address] [place], [place] [address]    Report Status PENDING  Incomplete  Blood Culture (routine x 2)     Status: None (Preliminary result)   Collection Time: 01/23/20 11:57 PM   Specimen: BLOOD  Result Value Ref Range Status   Specimen Description   Final    BLOOD RIGHT ANTECUBITAL Performed at [facility], [address] [place], [place] [address]    Special Requests   Final    BOTTLES DRAWN AEROBIC AND ANAEROBIC Blood Culture adequate volume Performed at [facility], [address] [place], [place] [address]    Culture   Final    NO GROWTH 2 DAYS Performed at [facility], [address], [place], [place] [address]    Report Status PENDING  Incomplete         Radiology Studies: No results found.      Scheduled Meds: . amLODipine  10 mg Oral Daily  . budesonide  1 puff Inhalation BID  . enoxaparin (LOVENOX) injection  50 mg Subcutaneous Q24H  . fluvoxaMINE  50 mg Oral Daily  . guaiFENesin  600 mg Oral BID  . insulin aspart  0-15 Units Subcutaneous TID WC  . insulin glargine  12 Units Subcutaneous Daily  . loratadine  10 mg Oral Daily  . losartan  100 mg Oral Daily  . metoprolol tartrate  12.5 mg Oral  BID  . predniSONE  50 mg Oral Daily   Continuous Infusions: . remdesivir 100 mg in NS 100 mL 100 mg (01/26/20 0919)     LOS: 3 days    Time spent:40 min    Arul Farabee, Roselind Messier, MD [facility] Pager [phone]  If 7PM-7AM, please contact night-coverage [email or web address] Password [email or web address] 01/27/2020, 9:02 AM

## 2020-01-27 DIAGNOSIS — R000 Tachycardia, unspecified: Secondary | ICD-10-CM | POA: Diagnosis present

## 2020-01-27 DIAGNOSIS — I10 Essential (primary) hypertension: Secondary | ICD-10-CM | POA: Diagnosis present

## 2020-01-27 LAB — COMPREHENSIVE METABOLIC PANEL
ALT: 46 U/L — ABNORMAL HIGH (ref 0–44)
AST: 22 U/L (ref 15–41)
Albumin: 3.3 g/dL — ABNORMAL LOW (ref 3.5–5.0)
Alkaline Phosphatase: 63 U/L (ref 38–126)
Anion gap: 8 (ref 5–15)
BUN: 13 mg/dL (ref 6–20)
CO2: 24 mmol/L (ref 22–32)
Calcium: 8.7 mg/dL — ABNORMAL LOW (ref 8.9–10.3)
Chloride: 102 mmol/L (ref 98–111)
Creatinine, Ser: 0.62 mg/dL (ref 0.44–1.00)
GFR calc Af Amer: >60 mL/min (ref >60)
GFR calc non Af Amer: >60 mL/min (ref >60)
Glucose, Bld: 246 mg/dL — ABNORMAL HIGH (ref 70–99)
Potassium: 3.9 mmol/L (ref 3.5–5.1)
Sodium: 134 mmol/L — ABNORMAL LOW (ref 135–145)
Total Bilirubin: 0.7 mg/dL (ref 0.3–1.2)
Total Protein: 6.9 g/dL (ref 6.5–8.1)

## 2020-01-27 LAB — CBC WITH DIFFERENTIAL/PLATELET
Abs Immature Granulocytes: 1.51 10*3/uL — ABNORMAL HIGH (ref 0.00–0.07)
Basophils Absolute: 0.2 10*3/uL — ABNORMAL HIGH (ref 0.0–0.1)
Basophils Relative: 1 %
Eosinophils Absolute: 0 10*3/uL (ref 0.0–0.5)
Eosinophils Relative: 0 %
HCT: 40.8 % (ref 36.0–46.0)
Hemoglobin: 13.7 g/dL (ref 12.0–15.0)
Immature Granulocytes: 8 %
Lymphocytes Relative: 11 %
Lymphs Abs: 2.2 10*3/uL (ref 0.7–4.0)
MCH: 27.6 pg (ref 26.0–34.0)
MCHC: 33.6 g/dL (ref 30.0–36.0)
MCV: 82.1 fL (ref 80.0–100.0)
Monocytes Absolute: 1.6 10*3/uL — ABNORMAL HIGH (ref 0.1–1.0)
Monocytes Relative: 8 %
Neutro Abs: 14.4 10*3/uL — ABNORMAL HIGH (ref 1.7–7.7)
Neutrophils Relative %: 72 %
Platelets: 418 10*3/uL — ABNORMAL HIGH (ref 150–400)
RBC: 4.97 MIL/uL (ref 3.87–5.11)
RDW: 13.4 % (ref 11.5–15.5)
WBC: 19.8 10*3/uL — ABNORMAL HIGH (ref 4.0–10.5)
nRBC: 0 % (ref 0.0–0.2)

## 2020-01-27 LAB — D-DIMER, QUANTITATIVE: D-Dimer, Quant: 0.32 ug/mL-FEU (ref 0.00–0.50)

## 2020-01-27 LAB — FERRITIN: Ferritin: 289 ng/mL (ref 11–307)

## 2020-01-27 LAB — GLUCOSE, CAPILLARY
Glucose-Capillary: 272 mg/dL — ABNORMAL HIGH (ref 70–99)
Glucose-Capillary: 276 mg/dL — ABNORMAL HIGH (ref 70–99)
Glucose-Capillary: 302 mg/dL — ABNORMAL HIGH (ref 70–99)
Glucose-Capillary: 292 mg/dL — ABNORMAL HIGH (ref 70–99)

## 2020-01-27 LAB — PHOSPHORUS: Phosphorus: 3.3 mg/dL (ref 2.5–4.6)

## 2020-01-27 LAB — C-REACTIVE PROTEIN: CRP: 1.8 mg/dL — ABNORMAL HIGH (ref <1.0)

## 2020-01-27 LAB — MAGNESIUM: Magnesium: 2.2 mg/dL (ref 1.7–2.4)

## 2020-01-27 LAB — LACTATE DEHYDROGENASE: LDH: 255 U/L — ABNORMAL HIGH (ref 98–192)

## 2020-01-27 MED ORDER — INSULIN ASPART 100 UNIT/ML ~~LOC~~ SOLN
8.0000 [IU] | Freq: Three times a day (TID) | SUBCUTANEOUS | Status: DC
  Administered 2020-01-27 – 2020-01-28 (×3): 8 [IU] via SUBCUTANEOUS

## 2020-01-27 MED ORDER — INSULIN GLARGINE 100 UNIT/ML ~~LOC~~ SOLN
20.0000 [IU] | Freq: Every day | SUBCUTANEOUS | Status: DC
  Administered 2020-01-28 – 2020-01-29 (×2): 20 [IU] via SUBCUTANEOUS
  Filled 2020-01-27 (×2): qty 0.2

## 2020-01-27 NOTE — Progress Notes (Signed)
Inpatient Diabetes Program Recommendations  AACE/ADA: New Consensus Statement on Inpatient Glycemic Control (2015)  Target Ranges:  Prepandial:   less than 140 mg/dL      Peak postprandial:   less than 180 mg/dL (1-2 hours)      Critically ill patients:  140 - 180 mg/dL   Lab Results  Component Value Date   GLUCAP 272 (H) 01/27/2020   HGBA1C 6.7 (H) 01/24/2020    Review of Glycemic Control  Diabetes history: none, A1c 6.7% while on steroids (decadron 10 mg given prior to admission in ED) Current orders for Inpatient glycemic control:  Lantus 12 units Daily Novolog 0-15 units tid   PO prednisone 50 mg Daily starting 9/17  Inpatient Diabetes Program Recommendations:    -  Add Novolog 4 units tid meal coverage -  Add Novolog hs scale -  Add Tradjenta 5 mg Daily (reduces mortality)  Thanks,  Christena Deem RN, MSN, BC-ADM Inpatient Diabetes Coordinator Team Pager [phone] (8a-5p)

## 2020-01-27 NOTE — Progress Notes (Signed)
PROGRESS NOTE    Robin Davis  IEP:[id number] DOB: 07-Apr-1981 DOA: 01/23/2020 PCP: Burtis Junes, MD (Inactive)     Brief Narrative:  39 year old BF PMHx obesity managed with phentermine via a weight loss clinic, HTN, prediabetes, and recurrent hyperkalemia   who presented to the ED via EMS with progressively worsening dyspnea.  She was diagnosed with Covid 01/16/2020.  She was quarantining at home but then developed severe diffuse body aches chills and progressive dyspnea.  She has been seen in the ED on multiple occasions since her initial diagnosis and received a monoclonal antibody infusion 9/12.  She summoned EMS to her house on the date of this admission when she found her oxygen saturations to be 70%.  CXR noted bilateral infiltrates consistent with Covid.   Subjective: 9/17 afebrile overnight positive S OB, negative CP, negative nausea, negative vomiting   Assessment & Plan: Covid vaccination; no vaccination   Active Problems:   Pneumonia due to COVID-19 virus   Depression   Hypokalemia   Acute on chronic respiratory failure with hypoxia (HCC)   Sinus tachycardia   Essential hypertension   Acute respiratory failure with hypoxia/COVID Pneumonia COVID-19 Labs  Recent Labs    01/25/20 0610 01/26/20 0404 01/27/20 0351  DDIMER 0.49 0.43 0.32  FERRITIN 478* 300 289  LDH  --  264* 255*  CRP 12.2* 4.7* 1.8*    Lab Results  Component Value Date   SARSCOV2NAA POSITIVE (A) 01/16/2020   -Remdesivir per pharmacy x5 days -Prednisone 50 mg daily -9/17 PT/OT work with patient daily  Sinus tachycardia -D-dimer not consistent with PE -Most likely secondary to patient's phentermine use for weight loss - Essential HTN -Amlodipine 10 mg daily -Losartan 100 mg daily -Metoprolol 12.5 mg BID  Hypokalemia -Resolved  Diabetes type 2 controlled with hyperglycemia -Patient meets guidelines for diabetes hemoglobin A1c> 6.4 -9/14 hemoglobin A1c= 6.7 -Would  start patient on Metformin upon discharge for for now we will control CBG with insulin considering she is also on high-dose steroids. -9/17 increase Lantus 20 units --9/17 Novolog 8 units qac -Moderate SSI    Morbid obesity  -Patient on an outside weight loss plan  Depression -Stable      DVT prophylaxis: Lovenox Code Status: Full Family Communication:  Status is: Inpatient    Dispo: The patient is from: Home              Anticipated d/c is to: Home              Anticipated d/c date is: 9/20              Patient currently unstable      Consultants:    Procedures/Significant Events:    I have personally reviewed and interpreted all radiology studies and my findings are as above.  VENTILATOR SETTINGS: Nasal cannula 9/17 Flow; 4 L/min SPO2 98%   Cultures   Antimicrobials: Anti-infectives (From admission, onward)   Start     Ordered Stop   01/24/20 1000  remdesivir 100 mg in sodium chloride 0.9 % 100 mL IVPB       "Followed by" Linked Group Details   01/23/20 2342 01/28/20 0959   01/24/20 0800  cefTRIAXone (ROCEPHIN) 2 g in sodium chloride 0.9 % 100 mL IVPB  Status:  Discontinued        01/24/20 0734 01/24/20 0959   01/24/20 0800  azithromycin (ZITHROMAX) 500 mg in sodium chloride 0.9 % 250 mL IVPB  Status:  Discontinued  01/24/20 0734 01/24/20 0959   01/23/20 2345  remdesivir 200 mg in sodium chloride 0.9% 250 mL IVPB       "Followed by" Linked Group Details   01/23/20 2342 01/24/20 0100       Devices    LINES / TUBES:      Continuous Infusions: . remdesivir 100 mg in NS 100 mL 100 mg (01/26/20 0919)     Objective: Vitals:   01/26/20 0946 01/26/20 1338 01/26/20 2125 01/27/20 0530  BP: (!) 130/95 140/87 (!) 138/94 (!) 142/92  Pulse: 90 94 92 86  Resp: 18 18 20 16   Temp: 98 F (36.7 C) 98.1 F (36.7 C) 98.6 F (37 C) 98.6 F (37 C)  TempSrc: Oral Oral    SpO2: 93% 94% 94% 98%  Weight:      Height:        Intake/Output  Summary (Last 24 hours) at 01/27/2020 01/29/2020 Last data filed at 01/27/2020 0820 Gross per 24 hour  Intake 457.44 ml  Output --  Net 457.44 ml   Filed Weights   01/23/20 2328  Weight: 99 kg    Examination:  General: A/O x4, positive acute respiratory distress Eyes: negative scleral hemorrhage, negative anisocoria, negative icterus ENT: Negative Runny nose, negative gingival bleeding, Neck:  Negative scars, masses, torticollis, lymphadenopathy, JVD Lungs Little to no breath sounds bilaterally without wheezes or crackles Cardiovascular: Regular rate and rhythm without murmur gallop or rub normal S1 and S2 Abdomen: OBESE, negative abdominal pain, nondistended, positive soft, bowel sounds, no rebound, no ascites, no appreciable mass Extremities: No significant cyanosis, clubbing, or edema bilateral lower extremities Skin: Negative rashes, lesions, ulcers Psychiatric:  Negative depression, negative anxiety, negative fatigue, negative mania  Central nervous system:  Cranial nerves II through XII intact, tongue/uvula midline, all extremities muscle strength 5/5, sensation intact throughout, negative dysarthria, negative expressive aphasia, negative receptive aphasia.  .     Data Reviewed: Care during the described time interval was provided by me .  I have reviewed this patient's available data, including medical history, events of note, physical examination, and all test results as part of my evaluation.  CBC: Recent Labs  Lab 01/22/20 2131 01/22/20 2131 01/23/20 2357 01/24/20 0443 01/25/20 0610 01/26/20 0404 01/27/20 0351  WBC 4.8   < > 8.2 6.7 12.3* 16.9* 19.8*  NEUTROABS 2.7  --  5.7  --  9.2* 13.4* 14.4*  HGB 14.2   < > 13.4 11.2* 13.6 13.3 13.7  HCT 42.5   < > 40.1 33.3* 40.2 41.0 40.8  MCV 82.5   < > 81.7 83.0 82.0 83.5 82.1  PLT 253   < > 264 218 341 414* 418*   < > = values in this interval not displayed.   Basic Metabolic Panel: Recent Labs  Lab 01/23/20 2357  01/24/20 0207 01/24/20 0443 01/25/20 0610 01/26/20 0404 01/27/20 0351  NA 134*  --  134* 138 137 134*  K 2.9*  --  4.5 4.2 3.8 3.9  CL 96*  --  100 104 104 102  CO2 25  --  24 25 24 24   GLUCOSE 204*  --  195* 198* 217* 246*  BUN <5*  --  <5* 10 11 13   CREATININE 0.69  --  0.69 0.69 0.59 0.62  CALCIUM 8.7*  --  8.1* 9.3 9.1 8.7*  MG  --  1.9  --   --  2.2 2.2  PHOS  --   --   --   --  3.7 3.3   GFR: Estimated Creatinine Clearance: 112.1 mL/min (by C-G formula based on SCr of 0.62 mg/dL). Liver Function Tests: Recent Labs  Lab 01/23/20 2357 01/24/20 0443 01/25/20 0610 01/26/20 0404 01/27/20 0351  AST 39 50* 32 24 22  ALT 35 36 36 31 46*  ALKPHOS 58 55 60 58 63  BILITOT 0.6 1.1 0.6 0.6 0.7  PROT 7.6 7.0 8.0 6.9 6.9  ALBUMIN 3.6 3.4* 3.6 3.2* 3.3*   No results for input(s): LIPASE, AMYLASE in the last 168 hours. No results for input(s): AMMONIA in the last 168 hours. Coagulation Profile: No results for input(s): INR, PROTIME in the last 168 hours. Cardiac Enzymes: No results for input(s): CKTOTAL, CKMB, CKMBINDEX, TROPONINI in the last 168 hours. BNP (last 3 results) No results for input(s): PROBNP in the last 8760 hours. HbA1C: No results for input(s): HGBA1C in the last 72 hours. CBG: Recent Labs  Lab 01/26/20 0817 01/26/20 1115 01/26/20 1624 01/26/20 2126 01/27/20 0735  GLUCAP 234* 278* 332* 322* 272*   Lipid Profile: No results for input(s): CHOL, HDL, LDLCALC, TRIG, CHOLHDL, LDLDIRECT in the last 72 hours. Thyroid Function Tests: No results for input(s): TSH, T4TOTAL, FREET4, T3FREE, THYROIDAB in the last 72 hours. Anemia Panel: Recent Labs    01/26/20 0404 01/27/20 0351  FERRITIN 300 289   Sepsis Labs: Recent Labs  Lab 01/22/20 2131 01/23/20 2357  PROCALCITON  --  <0.10  LATICACIDVEN 1.7 1.6    Recent Results (from the past 240 hour(s))  Blood Culture (routine x 2)     Status: None (Preliminary result)   Collection Time: 01/23/20 11:57 PM     Specimen: BLOOD  Result Value Ref Range Status   Specimen Description   Final    BLOOD LEFT ANTECUBITAL Performed at [facility], [address], [place], [place] [address]    Special Requests   Final    BOTTLES DRAWN AEROBIC AND ANAEROBIC Blood Culture adequate volume Performed at [facility], [address] [place], [place] [address]    Culture   Final    NO GROWTH 2 DAYS Performed at [facility], [address], [place], [place] [address]    Report Status PENDING  Incomplete  Blood Culture (routine x 2)     Status: None (Preliminary result)   Collection Time: 01/23/20 11:57 PM   Specimen: BLOOD  Result Value Ref Range Status   Specimen Description   Final    BLOOD RIGHT ANTECUBITAL Performed at [facility], [address], [place], [place] [address]    Special Requests   Final    BOTTLES DRAWN AEROBIC AND ANAEROBIC Blood Culture adequate volume Performed at [facility], [address] [place], [place] [address]    Culture   Final    NO GROWTH 2 DAYS Performed at [facility], [address] [place], [place] [address]    Report Status PENDING  Incomplete         Radiology Studies: No results found.      Scheduled Meds: . amLODipine  10 mg Oral Daily  . budesonide  1 puff Inhalation BID  . enoxaparin (LOVENOX) injection  50 mg Subcutaneous Q24H  . fluvoxaMINE  50 mg Oral Daily  . guaiFENesin  600 mg Oral BID  . insulin aspart  0-15 Units Subcutaneous TID WC  . insulin glargine  12 Units Subcutaneous Daily  . loratadine  10 mg Oral Daily  .  losartan  100 mg Oral Daily  . metoprolol tartrate  12.5 mg Oral BID  . predniSONE  50 mg Oral Daily   Continuous Infusions: . remdesivir 100 mg in NS 100 mL 100 mg (01/26/20 0919)     LOS: 3 days    Time spent:40 min    Mehki Klumpp, Roselind MessierURTIS J, MD [facility] Pager [phone]  If 7PM-7AM, please contact  night-coverage [email or web address] Password [email or web address] 01/27/2020, 9:04 AM

## 2020-01-28 LAB — CBC WITH DIFFERENTIAL/PLATELET
Abs Immature Granulocytes: 1.92 10*3/uL — ABNORMAL HIGH (ref 0.00–0.07)
Basophils Absolute: 0 10*3/uL (ref 0.0–0.1)
Basophils Relative: 0 %
Eosinophils Absolute: 0 10*3/uL (ref 0.0–0.5)
Eosinophils Relative: 0 %
HCT: 40.7 % (ref 36.0–46.0)
Hemoglobin: 13.6 g/dL (ref 12.0–15.0)
Immature Granulocytes: 10 %
Lymphocytes Relative: 16 %
Lymphs Abs: 3.2 10*3/uL (ref 0.7–4.0)
MCH: 27.6 pg (ref 26.0–34.0)
MCHC: 33.4 g/dL (ref 30.0–36.0)
MCV: 82.6 fL (ref 80.0–100.0)
Monocytes Absolute: 2.1 10*3/uL — ABNORMAL HIGH (ref 0.1–1.0)
Monocytes Relative: 11 %
Neutro Abs: 12.6 10*3/uL — ABNORMAL HIGH (ref 1.7–7.7)
Neutrophils Relative %: 63 %
Platelets: 498 10*3/uL — ABNORMAL HIGH (ref 150–400)
RBC: 4.93 MIL/uL (ref 3.87–5.11)
RDW: 13.4 % (ref 11.5–15.5)
WBC: 19.9 10*3/uL — ABNORMAL HIGH (ref 4.0–10.5)
nRBC: 0.1 % (ref 0.0–0.2)

## 2020-01-28 LAB — COMPREHENSIVE METABOLIC PANEL
ALT: 32 U/L (ref 0–44)
AST: 14 U/L — ABNORMAL LOW (ref 15–41)
Albumin: 3.2 g/dL — ABNORMAL LOW (ref 3.5–5.0)
Alkaline Phosphatase: 59 U/L (ref 38–126)
Anion gap: 9 (ref 5–15)
BUN: 14 mg/dL (ref 6–20)
CO2: 25 mmol/L (ref 22–32)
Calcium: 8.8 mg/dL — ABNORMAL LOW (ref 8.9–10.3)
Chloride: 101 mmol/L (ref 98–111)
Creatinine, Ser: 0.72 mg/dL (ref 0.44–1.00)
GFR calc Af Amer: >60 mL/min (ref >60)
GFR calc non Af Amer: >60 mL/min (ref >60)
Glucose, Bld: 263 mg/dL — ABNORMAL HIGH (ref 70–99)
Potassium: 3.8 mmol/L (ref 3.5–5.1)
Sodium: 135 mmol/L (ref 135–145)
Total Bilirubin: 0.8 mg/dL (ref 0.3–1.2)
Total Protein: 6.4 g/dL — ABNORMAL LOW (ref 6.5–8.1)

## 2020-01-28 LAB — LACTATE DEHYDROGENASE: LDH: 229 U/L — ABNORMAL HIGH (ref 98–192)

## 2020-01-28 LAB — GLUCOSE, CAPILLARY
Glucose-Capillary: 188 mg/dL — ABNORMAL HIGH (ref 70–99)
Glucose-Capillary: 252 mg/dL — ABNORMAL HIGH (ref 70–99)
Glucose-Capillary: 394 mg/dL — ABNORMAL HIGH (ref 70–99)
Glucose-Capillary: 254 mg/dL — ABNORMAL HIGH (ref 70–99)

## 2020-01-28 LAB — D-DIMER, QUANTITATIVE: D-Dimer, Quant: 0.28 ug/mL-FEU (ref 0.00–0.50)

## 2020-01-28 LAB — C-REACTIVE PROTEIN: CRP: 1 mg/dL — ABNORMAL HIGH (ref <1.0)

## 2020-01-28 LAB — MAGNESIUM: Magnesium: 2.3 mg/dL (ref 1.7–2.4)

## 2020-01-28 LAB — FERRITIN: Ferritin: 281 ng/mL (ref 11–307)

## 2020-01-28 LAB — PHOSPHORUS: Phosphorus: 3 mg/dL (ref 2.5–4.6)

## 2020-01-28 MED ORDER — PREDNISONE 5 MG PO TABS: 25.0000 mg | ORAL_TABLET | Freq: Every day | ORAL | Status: DC

## 2020-01-28 MED ORDER — INSULIN ASPART 100 UNIT/ML ~~LOC~~ SOLN
16.0000 [IU] | Freq: Three times a day (TID) | SUBCUTANEOUS | Status: DC
  Administered 2020-01-28 – 2020-01-29 (×4): 16 [IU] via SUBCUTANEOUS

## 2020-01-28 NOTE — Evaluation (Signed)
Physical Therapy Evaluation Patient Details Name: Robin Davis MRN: [id number] DOB: 03/07/1981 Today's Date: 01/28/2020   History of Present Illness  39 year old BF PMHx obesity managed with phentermine via a weight loss clinic, HTN, prediabetes, and recurrent hyperkalemia  who presented to the ED via EMS with progressively worsening dyspnea.  She was diagnosed with Covid 01/16/2020.  She was quarantining at home but then developed severe diffuse body aches chills and progressive dyspnea.  She has been seen in the ED on multiple occasions since her initial diagnosis and received a monoclonal antibody infusion 9/12.  She summoned EMS to her house on the date of this admission when she found her oxygen saturations to be 70%.  CXR noted bilateral infiltrates consistent with Covid.  Clinical Impression  The patient is relieved to be able to ambulate to the BR. Patient remained on 2 L [place], SPO2 95%. Patient plans  To Dc home . Patient will ambulate after eating breakfast. Patient should progress to return home. Pt admitted with above diagnosis.  Pt currently with functional limitations due to the deficits listed below (see PT Problem List). Pt will benefit from skilled PT to increase their independence and safety with mobility to allow discharge to the venue listed below.       Follow Up Recommendations No PT follow up    Equipment Recommendations  None recommended by PT    Recommendations for Other Services       Precautions / Restrictions Precautions Precaution Comments: monitor sats      Mobility  Bed Mobility Overal bed mobility: Independent                Transfers Overall transfer level: Independent                  Ambulation/Gait Ambulation/Gait assistance: Supervision Gait Distance (Feet): 40 Feet Assistive device: None Gait Pattern/deviations: Step-through pattern Gait velocity: decr   General Gait Details: no LOB, pt. ambulated on 2 L at 100%, on Ra  88%  Stairs            Wheelchair Mobility    Modified Rankin (Stroke Patients Only)       Balance Overall balance assessment: No apparent balance deficits (not formally assessed)                                           Pertinent Vitals/Pain Pain Assessment: Faces Faces Pain Scale: Hurts even more Pain Location: head Pain Descriptors / Indicators: Aching Pain Intervention(s): Monitored during session;Patient requesting pain meds-RN notified    Home Living Family/patient expects to be discharged to:: Private residence Living Arrangements: Children Available Help at Discharge: Family Type of Home: House Home Access: Stairs to enter   [employer] of Steps: 1 Home Layout: Two level;Full bath on main level;Bed/bath upstairs Home Equipment: None      Prior Function Level of Independence: Independent               Hand Dominance   Dominant Hand: Right    Extremity/Trunk Assessment   Upper Extremity Assessment Upper Extremity Assessment: Overall WFL for tasks assessed    Lower Extremity Assessment Lower Extremity Assessment: Overall WFL for tasks assessed    Cervical / Trunk Assessment Cervical / Trunk Assessment: Normal  Communication   Communication: No difficulties  Cognition Arousal/Alertness: Awake/alert Behavior During Therapy: WFL for tasks assessed/performed Overall Cognitive  Status: Within Functional Limits for tasks assessed                                        General Comments      Exercises     Assessment/Plan    PT Assessment Patient needs continued PT services  PT Problem List Decreased strength;Decreased mobility       PT Treatment Interventions Gait training;Stair training;Functional mobility training;Therapeutic activities;Therapeutic exercise;Patient/family education    PT Goals (Current goals can be found in the Care Plan section)  Acute Rehab PT Goals Patient Stated  Goal: go home PT Goal Formulation: With patient Time For Goal Achievement: 02/10/20 Potential to Achieve Goals: Good    Frequency Min 3X/week   Barriers to discharge        Co-evaluation               AM-PAC PT "6 Clicks" Mobility  Outcome Measure Help needed turning from your back to your side while in a flat bed without using bedrails?: None Help needed moving from lying on your back to sitting on the side of a flat bed without using bedrails?: None Help needed moving to and from a bed to a chair (including a wheelchair)?: None Help needed standing up from a chair using your arms (e.g., wheelchair or bedside chair)?: None Help needed to walk in hospital room?: A Little Help needed climbing 3-5 steps with a railing? : A Little 6 Click Score: 22    End of Session Equipment Utilized During Treatment: Oxygen Activity Tolerance: Patient tolerated treatment well Patient left: in chair;with call bell/phone within reach Nurse Communication: Mobility status PT Visit Diagnosis: Difficulty in walking, not elsewhere classified (R26.2)    Time: 8756-4332 PT Time Calculation (min) (ACUTE ONLY): 17 min   Charges:   PT Evaluation $PT Eval Low Complexity: 1 Low          Blanchard Kelch PT Acute Rehabilitation Services Pager [phone] Office [phone]   Rada Hay 01/28/2020, 1:05 PM

## 2020-01-28 NOTE — Plan of Care (Signed)

## 2020-01-28 NOTE — Progress Notes (Signed)
PROGRESS NOTE    Robin WAGUESPACK  MGQ:[id number] DOB: 01/20/1981 DOA: 01/23/2020 PCP: Burtis Junes, MD (Inactive)     Brief Narrative:  39 year old BF PMHx obesity managed with phentermine via a weight loss clinic, HTN, prediabetes, and recurrent hyperkalemia   who presented to the ED via EMS with progressively worsening dyspnea.  She was diagnosed with Covid 01/16/2020.  She was quarantining at home but then developed severe diffuse body aches chills and progressive dyspnea.  She has been seen in the ED on multiple occasions since her initial diagnosis and received a monoclonal antibody infusion 9/12.  She summoned EMS to her house on the date of this admission when she found her oxygen saturations to be 70%.  CXR noted bilateral infiltrates consistent with Covid.   Subjective: 9/18 A/O x4, positive S OB, negative CP, negative nausea, negative vomiting   Assessment & Plan: Covid vaccination; no vaccination   Active Problems:   Pneumonia due to COVID-19 virus   Depression   Hypokalemia   Acute on chronic respiratory failure with hypoxia (HCC)   Sinus tachycardia   Essential hypertension   Acute respiratory failure with hypoxia/COVID Pneumonia COVID-19 Labs  Recent Labs    01/26/20 0404 01/27/20 0351 01/28/20 0431  DDIMER 0.43 0.32 0.28  FERRITIN 300 289 281  LDH 264* 255* 229*  CRP 4.7* 1.8* 1.0*    Lab Results  Component Value Date   SARSCOV2NAA POSITIVE (A) 01/16/2020   -Remdesivir per pharmacy x5 days -9/18 decreased Prednisone 25 mg daily -9/17 PT/OT work with patient daily -SATURATION QUALIFICATIONS: (This note is used to comply with regulatory documentation for home oxygen) Patient Saturations on Room Air at Rest = 93% Patient Saturations on [employer] while Ambulating = 86% Patient Saturations on 2 Liters of oxygen while Ambulating = 90% Please briefly explain why patient needs home oxygen: After walking a few feet pt. desats below 87% pt. Will  need oxygen to perform ADLs at home with desatting. -Patient meets criteria for home O2 -2 L O2 titrate to maintain SPO2> 88% -Provide [employer] home O2 portable concentrator  Sinus tachycardia -D-dimer not consistent with PE -Most likely secondary to patient's phentermine use for weight loss - Essential HTN -Amlodipine 10 mg daily -Losartan 100 mg daily -Metoprolol 12.5 mg BID  Hypokalemia -Resolved  Diabetes type 2 controlled with hyperglycemia -Patient meets guidelines for diabetes hemoglobin A1c> 6.4 -9/14 hemoglobin A1c= 6.7 -Would start patient on Metformin upon discharge for for now we will control CBG with insulin considering she is also on high-dose steroids. -9/17 increase Lantus 20 units --9/18 increase NovoLog 16 units qac  -Moderate SSI    Morbid obesity  -Patient on an outside weight loss plan  Depression -Stable  Updated Covid test -Patient will not be allowed to return to school without an official negative Covid test.  Ordered on 9/18  Goals of care -9/18 PT evaluated patient no PT follow-up required  DVT prophylaxis: Lovenox Code Status: Full Family Communication:  Status is: Inpatient    Dispo: The patient is from: Home              Anticipated d/c is to: Home              Anticipated d/c date is: 9/20              Patient currently unstable      Consultants:    Procedures/Significant Events:    I have personally reviewed and interpreted all radiology studies  and my findings are as above.  VENTILATOR SETTINGS: Nasal cannula 9/17 Flow; 4 L/min SPO2 98%   Cultures   Antimicrobials: Anti-infectives (From admission, onward)   Start     Ordered Stop   01/24/20 1000  remdesivir 100 mg in sodium chloride 0.9 % 100 mL IVPB       "Followed by" Linked Group Details   01/23/20 2342 01/28/20 0959   01/24/20 0800  cefTRIAXone (ROCEPHIN) 2 g in sodium chloride 0.9 % 100 mL IVPB  Status:  Discontinued        01/24/20 0734 01/24/20  0959   01/24/20 0800  azithromycin (ZITHROMAX) 500 mg in sodium chloride 0.9 % 250 mL IVPB  Status:  Discontinued        01/24/20 0734 01/24/20 0959   01/23/20 2345  remdesivir 200 mg in sodium chloride 0.9% 250 mL IVPB       "Followed by" Linked Group Details   01/23/20 2342 01/24/20 0100       Devices    LINES / TUBES:      Continuous Infusions:    Objective: Vitals:   01/27/20 2155 01/28/20 0538 01/28/20 0800 01/28/20 1000  BP: (!) 143/96 (!) 142/96    Pulse: 96 70    Resp: 20 18 20 17   Temp: 98 F (36.7 C) 98.6 F (37 C)    TempSrc:      SpO2: 96% 98% 93%   Weight:      Height:        Intake/Output Summary (Last 24 hours) at 01/28/2020 1307 Last data filed at 01/28/2020 0900 Gross per 24 hour  Intake 1085 ml  Output 500 ml  Net 585 ml   Filed Weights   01/23/20 2328  Weight: 99 kg    Examination:  General: A/O x4, positive acute respiratory distress Eyes: negative scleral hemorrhage, negative anisocoria, negative icterus ENT: Negative Runny nose, negative gingival bleeding, Neck:  Negative scars, masses, torticollis, lymphadenopathy, JVD Lungs Little to no breath sounds bilaterally without wheezes or crackles Cardiovascular: Regular rate and rhythm without murmur gallop or rub normal S1 and S2 Abdomen: OBESE, negative abdominal pain, nondistended, positive soft, bowel sounds, no rebound, no ascites, no appreciable mass Extremities: No significant cyanosis, clubbing, or edema bilateral lower extremities Skin: Negative rashes, lesions, ulcers Psychiatric:  Negative depression, negative anxiety, negative fatigue, negative mania  Central nervous system:  Cranial nerves II through XII intact, tongue/uvula midline, all extremities muscle strength 5/5, sensation intact throughout, negative dysarthria, negative expressive aphasia, negative receptive aphasia.  .     Data Reviewed: Care during the described time interval was provided by me .  I have reviewed  this patient's available data, including medical history, events of note, physical examination, and all test results as part of my evaluation.  CBC: Recent Labs  Lab 01/23/20 2357 01/23/20 2357 01/24/20 0443 01/25/20 0610 01/26/20 0404 01/27/20 0351 01/28/20 0431  WBC 8.2   < > 6.7 12.3* 16.9* 19.8* 19.9*  NEUTROABS 5.7  --   --  9.2* 13.4* 14.4* 12.6*  HGB 13.4   < > 11.2* 13.6 13.3 13.7 13.6  HCT 40.1   < > 33.3* 40.2 41.0 40.8 40.7  MCV 81.7   < > 83.0 82.0 83.5 82.1 82.6  PLT 264   < > 218 341 414* 418* 498*   < > = values in this interval not displayed.   Basic Metabolic Panel: Recent Labs  Lab 01/23/20 2357 01/24/20 0207 01/24/20 0443 01/25/20 0610 01/26/20  0404 01/27/20 0351 01/28/20 0431  NA   < >  --  134* 138 137 134* 135  K   < >  --  4.5 4.2 3.8 3.9 3.8  CL   < >  --  100 104 104 102 101  CO2   < >  --  24 25 24 24 25   GLUCOSE   < >  --  195* 198* 217* 246* 263*  BUN   < >  --  <5* 10 11 13 14   CREATININE   < >  --  0.69 0.69 0.59 0.62 0.72  CALCIUM   < >  --  8.1* 9.3 9.1 8.7* 8.8*  MG  --  1.9  --   --  2.2 2.2 2.3  PHOS  --   --   --   --  3.7 3.3 3.0   < > = values in this interval not displayed.   GFR: Estimated Creatinine Clearance: 112.1 mL/min (by C-G formula based on SCr of 0.72 mg/dL). Liver Function Tests: Recent Labs  Lab 01/24/20 0443 01/25/20 0610 01/26/20 0404 01/27/20 0351 01/28/20 0431  AST 50* 32 24 22 14*  ALT 36 36 31 46* 32  ALKPHOS 55 60 58 63 59  BILITOT 1.1 0.6 0.6 0.7 0.8  PROT 7.0 8.0 6.9 6.9 6.4*  ALBUMIN 3.4* 3.6 3.2* 3.3* 3.2*   No results for input(s): LIPASE, AMYLASE in the last 168 hours. No results for input(s): AMMONIA in the last 168 hours. Coagulation Profile: No results for input(s): INR, PROTIME in the last 168 hours. Cardiac Enzymes: No results for input(s): CKTOTAL, CKMB, CKMBINDEX, TROPONINI in the last 168 hours. BNP (last 3 results) No results for input(s): PROBNP in the last 8760  hours. HbA1C: No results for input(s): HGBA1C in the last 72 hours. CBG: Recent Labs  Lab 01/27/20 1116 01/27/20 1645 01/27/20 2157 01/28/20 0758 01/28/20 1200  GLUCAP 276* 302* 292* 188* 252*   Lipid Profile: No results for input(s): CHOL, HDL, LDLCALC, TRIG, CHOLHDL, LDLDIRECT in the last 72 hours. Thyroid Function Tests: No results for input(s): TSH, T4TOTAL, FREET4, T3FREE, THYROIDAB in the last 72 hours. Anemia Panel: Recent Labs    01/27/20 0351 01/28/20 0431  FERRITIN 289 281   Sepsis Labs: Recent Labs  Lab 01/22/20 2131 01/23/20 2357  PROCALCITON  --  <0.10  LATICACIDVEN 1.7 1.6    Recent Results (from the past 240 hour(s))  Blood Culture (routine x 2)     Status: None (Preliminary result)   Collection Time: 01/23/20 11:57 PM   Specimen: BLOOD  Result Value Ref Range Status   Specimen Description   Final    BLOOD LEFT ANTECUBITAL Performed at [facility], [address] [place], [place]    Special Requests   Final    BOTTLES DRAWN AEROBIC AND ANAEROBIC Blood Culture adequate volume Performed at [facility], [address], [place], [place]    Culture   Final    NO GROWTH 3 DAYS Performed at [facility], [address], [place], [address] [place]    Report Status PENDING  Incomplete  Blood Culture (routine x 2)     Status: None (Preliminary result)   Collection Time: 01/23/20 11:57 PM   Specimen: BLOOD  Result Value Ref Range Status   Specimen Description   Final    BLOOD RIGHT ANTECUBITAL Performed at [facility], [address], [place], [place]  Special Requests   Final    BOTTLES DRAWN AEROBIC AND ANAEROBIC Blood Culture adequate volume Performed at [facility], [address] [place], [place] [address]    Culture   Final    NO GROWTH 3 DAYS Performed at [facility], [address] [place], [place] [address]     Report Status PENDING  Incomplete         Radiology Studies: No results found.      Scheduled Meds: . amLODipine  10 mg Oral Daily  . budesonide  1 puff Inhalation BID  . enoxaparin (LOVENOX) injection  50 mg Subcutaneous Q24H  . fluvoxaMINE  50 mg Oral Daily  . guaiFENesin  600 mg Oral BID  . insulin aspart  0-15 Units Subcutaneous TID WC  . insulin aspart  8 Units Subcutaneous TID WC  . insulin glargine  20 Units Subcutaneous Daily  . loratadine  10 mg Oral Daily  . losartan  100 mg Oral Daily  . metoprolol tartrate  12.5 mg Oral BID  . predniSONE  50 mg Oral Daily   Continuous Infusions:    LOS: 4 days    Time spent:40 min    Wendee Hata, Roselind MessierURTIS J, MD [facility] Pager [phone]  If 7PM-7AM, please contact night-coverage [email or web address] Password [email or web address] 01/28/2020, 1:07 PM

## 2020-01-28 NOTE — Progress Notes (Signed)
Physical Therapy Treatment Patient Details Name: Robin Davis MRN: [id number] DOB: [birth year] Today's Date: 01/28/2020    History of Present Illness 39 year old BF PMHx obesity managed with phentermine via a weight loss clinic, HTN, prediabetes, and recurrent hyperkalemia  who presented to the ED via EMS with progressively worsening dyspnea.  She was diagnosed with Covid 01/16/2020.  She was quarantining at home but then developed severe diffuse body aches chills and progressive dyspnea.  She has been seen in the ED on multiple occasions since her initial diagnosis and received a monoclonal antibody infusion 9/12.  She summoned EMS to her house on the date of this admission when she found her oxygen saturations to be 70%.  CXR noted bilateral infiltrates consistent with Covid.    PT Comments    The  Patient ambulated x 800'. On 2 L SPO2 remained greater than 96%. When titrated to RA during ambulation, SPO2 dropped to 87%.  Patient is progressing well. Remained on 2 L after return to room.    Follow Up Recommendations  No PT follow up     Equipment Recommendations  None recommended by PT    Recommendations for Other Services       Precautions / Restrictions Precautions Precaution Comments: monitor sats Restrictions Weight Bearing Restrictions: No    Mobility  Bed Mobility Overal bed mobility: Independent             General bed mobility comments: in recliner  Transfers Overall transfer level: Independent                  Ambulation/Gait Ambulation/Gait assistance: Supervision Gait Distance (Feet): 800 Feet Assistive device: None Gait Pattern/deviations: Step-through pattern Gait velocity: decr   General Gait Details: no LOB, pt. ambulated on 2 L at 100%, on Ra 86%   Stairs             Wheelchair Mobility    Modified Rankin (Stroke Patients Only)       Balance Overall balance assessment: No apparent balance deficits (not formally  assessed)                                          Cognition Arousal/Alertness: Awake/alert Behavior During Therapy: WFL for tasks assessed/performed Overall Cognitive Status: Within Functional Limits for tasks assessed                                        Exercises      General Comments        Pertinent Vitals/Pain Pain Assessment: Faces Faces Pain Scale: Hurts little more Pain Location: head Pain Descriptors / Indicators: Aching Pain Intervention(s): Monitored during session (RN aware)    Home Living Family/patient expects to be discharged to:: Private residence Living Arrangements: Children Available Help at Discharge: Family Type of Home: House Home Access: Stairs to enter   Home Layout: Two level;Full bath on main level;Bed/bath upstairs Home Equipment: None      Prior Function Level of Independence: Independent          PT Goals (current goals can now be found in the care plan section) Acute Rehab PT Goals Patient Stated Goal: go home PT Goal Formulation: With patient Time For Goal Achievement: 02/10/20 Potential to Achieve Goals: Good Progress towards PT goals: Progressing toward goals  Frequency    Min 3X/week      PT Plan Current plan remains appropriate    Co-evaluation              AM-PAC PT "6 Clicks" Mobility   Outcome Measure  Help needed turning from your back to your side while in a flat bed without using bedrails?: None Help needed moving from lying on your back to sitting on the side of a flat bed without using bedrails?: None Help needed moving to and from a bed to a chair (including a wheelchair)?: None Help needed standing up from a chair using your arms (e.g., wheelchair or bedside chair)?: None Help needed to walk in hospital room?: A Little Help needed climbing 3-5 steps with a railing? : A Little 6 Click Score: 22    End of Session Equipment Utilized During Treatment:  Oxygen Activity Tolerance: Patient tolerated treatment well Patient left: in chair;with call bell/phone within reach Nurse Communication: Mobility status PT Visit Diagnosis: Difficulty in walking, not elsewhere classified (R26.2)     Time: 1010-1035 PT Time Calculation (min) (ACUTE ONLY): 25 min  Charges:  $Gait Training: 23-37 mins                     Blanchard Kelch PT Acute Rehabilitation Services Pager [phone] Office [phone]    Rada Hay 01/28/2020, 1:32 PM

## 2020-01-28 NOTE — Progress Notes (Signed)
Occupational Therapy Evaluation  Patient with functional deficits listed below impacting safety and independence with self care. Patient supervision level for standing sink side to perform sponge bath, with x1 seated rest break. Patient maintain 92-93% on 2.5L with activity, patient primary complaint of headache in L temple area RN aware. Educate + encourage patient on use of incentive spirometer. Will continue to follow to maximize patient activity tolerance and further education regarding breathing strategies and energy conservation in order to discharge to venue listed below.    01/28/20 1323  OT Visit Information  Last OT Received On 01/28/20  Assistance Needed +1  History of Present Illness 39 year old BF PMHx obesity managed with phentermine via a weight loss clinic, HTN, prediabetes, and recurrent hyperkalemia  who presented to the ED via EMS with progressively worsening dyspnea.  She was diagnosed with Covid 01/16/2020.  She was quarantining at home but then developed severe diffuse body aches chills and progressive dyspnea.  She has been seen in the ED on multiple occasions since her initial diagnosis and received a monoclonal antibody infusion 9/12.  She summoned EMS to her house on the date of this admission when she found her oxygen saturations to be 70%.  CXR noted bilateral infiltrates consistent with Covid.  Precautions  Precaution Comments monitor sats  Restrictions  Weight Bearing Restrictions No  Home Living  Family/patient expects to be discharged to: Private residence  Living Arrangements Children  Available Help at Discharge Family  Type of Home House  Home Access Stairs to enter  Entrance Stairs-Number of Steps 1  Home Layout Two level;Full bath on main level;Bed/bath upstairs  Alternate Level Stairs-Number of Steps 6 +5  Bathroom Shower/Tub Tub/shower unit  Home Equipment None  Prior Function  Level of Independence Independent  Communication  Communication No  difficulties  Pain Assessment  Pain Assessment Faces  Faces Pain Scale 4  Pain Location head  Pain Descriptors / Indicators Aching  Pain Intervention(s) Monitored during session (RN aware)  Cognition  Arousal/Alertness Awake/alert  Behavior During Therapy WFL for tasks assessed/performed  Overall Cognitive Status Within Functional Limits for tasks assessed  Upper Extremity Assessment  Upper Extremity Assessment Overall WFL for tasks assessed  Lower Extremity Assessment  Lower Extremity Assessment Defer to PT evaluation  ADL  Overall ADL's  Needs assistance/impaired  Grooming Supervision/safety;Wash/dry face;Wash/dry hands;Applying deodorant;Standing  Upper Body Bathing Supervision/ safety;Standing  Lower Body Bathing Supervison/ safety;Sit to/from stand  Upper Body Dressing  Set up;Sitting  Lower Body Dressing Supervision/safety;Sitting/lateral leans  Lower Body Dressing Details (indicate cue type and reason) pt able to don socks seated in chair   Toilet Transfer Supervision/safety;Ambulation;BSC  Toileting- Horticulturist, commercial;Sit to/from stand  Functional mobility during ADLs Supervision/safety  General ADL Comments patient close to baseline level, supervision for safety due to decreased activity tolerance requiring increased time to perform sponge bath and don clean gown, socks. Patient maintain 92-93% on 2.5L with activity  Bed Mobility  General bed mobility comments in recliner  Transfers  Overall transfer level Independent  Balance  Overall balance assessment No apparent balance deficits (not formally assessed)  OT - End of Session  Equipment Utilized During Treatment Oxygen  Activity Tolerance Patient tolerated treatment well  Patient left in chair;with call bell/phone within reach;with nursing/sitter in room  Nurse Communication Mobility status  OT Assessment  OT Recommendation/Assessment Patient needs continued OT Services  OT Visit  Diagnosis Other abnormalities of gait and mobility (R26.89);Pain  Pain - Right/Left Left  Pain -  part of body  (head/temple )  OT Problem List Decreased activity tolerance;Cardiopulmonary status limiting activity;Obesity  OT Plan  OT Frequency (ACUTE ONLY) Min 2X/week  OT Treatment/Interventions (ACUTE ONLY) Self-care/ADL training;Energy conservation;DME and/or AE instruction;Therapeutic activities;Patient/family education;Balance training;Therapeutic exercise  AM-PAC OT "6 Clicks" Daily Activity Outcome Measure (Version 2)  Help from another person eating meals? 4  Help from another person taking care of personal grooming? 3  Help from another person toileting, which includes using toliet, bedpan, or urinal? 3  Help from another person bathing (including washing, rinsing, drying)? 3  Help from another person to put on and taking off regular upper body clothing? 3  Help from another person to put on and taking off regular lower body clothing? 3  6 Click Score 19  OT Recommendation  Follow Up Recommendations No OT follow up  OT Equipment None recommended by OT  Individuals Consulted  Consulted and Agree with Results and Recommendations Patient  Acute Rehab OT Goals  Patient Stated Goal go home  OT Goal Formulation With patient  Time For Goal Achievement 02/11/20  Potential to Achieve Goals Good  OT Time Calculation  OT Start Time (ACUTE ONLY) 1137  OT Stop Time (ACUTE ONLY) 1200  OT Time Calculation (min) 23 min  OT General Charges  $OT Visit 1 Visit  OT Evaluation  $OT Eval Low Complexity 1 Low  OT Treatments  $Self Care/Home Management  8-22 mins  Written Expression  Dominant Hand Right   Marlyce Huge OT OT pager: [phone]

## 2020-01-28 NOTE — Progress Notes (Signed)
SATURATION QUALIFICATIONS: (This note is used to comply with regulatory documentation for home oxygen)  Patient Saturations on Room Air at Rest = 93%  Patient Saturations on Room Air while Ambulating = 86%  Patient Saturations on 2 Liters of oxygen while Ambulating = 90%  Please briefly explain why patient needs home oxygen: After walking a few feet pt. desats below 87% pt. Will need oxygen to perform ADLs at home with desatting.

## 2020-01-29 LAB — COMPREHENSIVE METABOLIC PANEL
ALT: 30 U/L (ref 0–44)
AST: 14 U/L — ABNORMAL LOW (ref 15–41)
Albumin: 3.2 g/dL — ABNORMAL LOW (ref 3.5–5.0)
Alkaline Phosphatase: 51 U/L (ref 38–126)
Anion gap: 7 (ref 5–15)
BUN: 13 mg/dL (ref 6–20)
CO2: 28 mmol/L (ref 22–32)
Calcium: 8.8 mg/dL — ABNORMAL LOW (ref 8.9–10.3)
Chloride: 104 mmol/L (ref 98–111)
Creatinine, Ser: 0.78 mg/dL (ref 0.44–1.00)
GFR calc Af Amer: >60 mL/min (ref >60)
GFR calc non Af Amer: >60 mL/min (ref >60)
Glucose, Bld: 136 mg/dL — ABNORMAL HIGH (ref 70–99)
Potassium: 3.6 mmol/L (ref 3.5–5.1)
Sodium: 139 mmol/L (ref 135–145)
Total Bilirubin: 0.6 mg/dL (ref 0.3–1.2)
Total Protein: 6.2 g/dL — ABNORMAL LOW (ref 6.5–8.1)

## 2020-01-29 LAB — CBC WITH DIFFERENTIAL/PLATELET
Abs Immature Granulocytes: 2.41 10*3/uL — ABNORMAL HIGH (ref 0.00–0.07)
Basophils Absolute: 0 10*3/uL (ref 0.0–0.1)
Basophils Relative: 0 %
Eosinophils Absolute: 0.1 10*3/uL (ref 0.0–0.5)
Eosinophils Relative: 0 %
HCT: 41.9 % (ref 36.0–46.0)
Hemoglobin: 13.9 g/dL (ref 12.0–15.0)
Immature Granulocytes: 12 %
Lymphocytes Relative: 28 %
Lymphs Abs: 5.5 10*3/uL — ABNORMAL HIGH (ref 0.7–4.0)
MCH: 27.3 pg (ref 26.0–34.0)
MCHC: 33.2 g/dL (ref 30.0–36.0)
MCV: 82.2 fL (ref 80.0–100.0)
Monocytes Absolute: 1.8 10*3/uL — ABNORMAL HIGH (ref 0.1–1.0)
Monocytes Relative: 9 %
Neutro Abs: 9.8 10*3/uL — ABNORMAL HIGH (ref 1.7–7.7)
Neutrophils Relative %: 51 %
Platelets: 521 10*3/uL — ABNORMAL HIGH (ref 150–400)
RBC: 5.1 MIL/uL (ref 3.87–5.11)
RDW: 13.4 % (ref 11.5–15.5)
WBC: 19.7 10*3/uL — ABNORMAL HIGH (ref 4.0–10.5)
nRBC: 0.1 % (ref 0.0–0.2)

## 2020-01-29 LAB — SARS CORONAVIRUS 2 (TAT 6-24 HRS): SARS Coronavirus 2: POSITIVE — AB

## 2020-01-29 LAB — LACTATE DEHYDROGENASE: LDH: 211 U/L — ABNORMAL HIGH (ref 98–192)

## 2020-01-29 LAB — GLUCOSE, CAPILLARY
Glucose-Capillary: 108 mg/dL — ABNORMAL HIGH (ref 70–99)
Glucose-Capillary: 112 mg/dL — ABNORMAL HIGH (ref 70–99)
Glucose-Capillary: 135 mg/dL — ABNORMAL HIGH (ref 70–99)
Glucose-Capillary: 193 mg/dL — ABNORMAL HIGH (ref 70–99)

## 2020-01-29 LAB — D-DIMER, QUANTITATIVE: D-Dimer, Quant: 0.34 ug/mL-FEU (ref 0.00–0.50)

## 2020-01-29 LAB — CULTURE, BLOOD (ROUTINE X 2)
Culture: NO GROWTH
Culture: NO GROWTH
Special Requests: ADEQUATE
Special Requests: ADEQUATE

## 2020-01-29 LAB — MAGNESIUM: Magnesium: 2.3 mg/dL (ref 1.7–2.4)

## 2020-01-29 LAB — C-REACTIVE PROTEIN: CRP: 0.9 mg/dL (ref <1.0)

## 2020-01-29 LAB — PHOSPHORUS: Phosphorus: 3.6 mg/dL (ref 2.5–4.6)

## 2020-01-29 LAB — FERRITIN: Ferritin: 283 ng/mL (ref 11–307)

## 2020-01-29 MED ORDER — PREDNISONE 10 MG PO TABS
10.0000 mg | ORAL_TABLET | Freq: Every day | ORAL | Status: DC
  Administered 2020-01-29: 10 mg via ORAL
  Filled 2020-01-29: qty 1

## 2020-01-29 MED ORDER — INSULIN GLARGINE 100 UNITS/ML SOLOSTAR PEN: 20.0000 [IU] | PEN_INJECTOR | Freq: Every day | SUBCUTANEOUS | 0 refills | Status: DC

## 2020-01-29 MED ORDER — GUAIFENESIN-DM 100-10 MG/5ML PO SYRP: 5.0000 mL | ORAL_SOLUTION | ORAL | 0 refills | Status: DC | PRN

## 2020-01-29 MED ORDER — METFORMIN HCL 500 MG PO TABS
500.0000 mg | ORAL_TABLET | Freq: Two times a day (BID) | ORAL | 0 refills | Status: DC
Start: 2020-01-29 — End: 2021-10-15

## 2020-01-29 MED ORDER — METFORMIN HCL 500 MG PO TABS
500.0000 mg | ORAL_TABLET | Freq: Two times a day (BID) | ORAL | Status: DC
  Administered 2020-01-29 (×2): 500 mg via ORAL
  Filled 2020-01-29 (×2): qty 1

## 2020-01-29 MED ORDER — PREDNISONE 10 MG PO TABS
10.0000 mg | ORAL_TABLET | Freq: Every day | ORAL | 0 refills | Status: DC
Start: 2020-01-29 — End: 2021-10-15

## 2020-01-29 MED ORDER — METOPROLOL TARTRATE 25 MG PO TABS: 12.5000 mg | ORAL_TABLET | Freq: Two times a day (BID) | ORAL | 0 refills | Status: DC

## 2020-01-29 MED ORDER — NOVOLOG FLEXPEN 100 UNIT/ML ~~LOC~~ SOPN: 16.0000 [IU] | PEN_INJECTOR | Freq: Three times a day (TID) | SUBCUTANEOUS | 0 refills | Status: DC

## 2020-01-29 MED ORDER — LOSARTAN POTASSIUM 100 MG PO TABS: 100.0000 mg | ORAL_TABLET | Freq: Every day | ORAL | 0 refills | Status: AC

## 2020-01-29 MED ORDER — INSULIN STARTER KIT- PEN NEEDLES (ENGLISH): 0 refills | Status: DC

## 2020-01-29 MED ORDER — LIP MEDEX EX OINT
TOPICAL_OINTMENT | CUTANEOUS | Status: DC | PRN
  Filled 2020-01-29: qty 7

## 2020-01-29 NOTE — Progress Notes (Signed)
Pt leaving with O2 on.  Pt going home via "Lift" this evening.

## 2020-01-29 NOTE — Plan of Care (Signed)
Insulin pen and insulin administration taught. Pt aware of reasons to return to hospital. Pt to see PCP.

## 2020-01-29 NOTE — Progress Notes (Signed)
Discharge instructions given/explained with pt verbalizing understanding.

## 2020-01-29 NOTE — Progress Notes (Signed)
Patient called out, stated that she felt "off", like possibly her blood sugar was high, CBG checked and was 112, pt's insulin orders were increased yesterday so gave an apple juice and a few graham crackers in case blood sugar continued to drop. Will continue to monitor patient for any signs of hypoglycemia.

## 2020-01-29 NOTE — Discharge Summary (Addendum)
Physician Discharge Summary  Robin Davis MWN:[id number] DOB: [birth year] DOA: 01/23/2020  PCP: Elizabeth Palau, MD (Inactive)  Admit date: 01/23/2020 Discharge date: 01/29/2020  Time spent:35 minutes  Recommendations for Outpatient Follow-up:   Covid vaccination; no vaccination  Acute respiratory failure with hypoxia/COVID Pneumonia COVID-19 Labs  Recent Labs    01/27/20 0351 01/28/20 0431 01/29/20 0651  DDIMER 0.32 0.28 0.34  FERRITIN 289 281 283  LDH 255* 229* 211*  CRP 1.8* 1.0* 0.9    Lab Results  Component Value Date   SARSCOV2NAA POSITIVE (A) 01/28/2020   SARSCOV2NAA POSITIVE (A) 01/16/2020   -Remdesivir per pharmacy x5 days -9/19 decreased Prednisone  10 mg daily to complete her 10-day course of steroids. -SATURATION QUALIFICATIONS: (Thisnote is usedto comply with regulatory documentation for home oxygen) Patient Saturations on Room Air at Rest =93% Patient Saturations on Room Air while Ambulating =86% Patient Saturations on2Liters of oxygen while Ambulating = 90% Please briefly explain why patient needs home oxygen:After walking a few feet pt. desats below 87% pt. Will need oxygen to perform ADLs at home with desatting. -Patient meets criteria for home O2 -2 L O2 titrate to maintain SPO2> 88% -Provide Inogen home O2 portable concentrator -Patient will be considered contagious until 02/18/2020 and should take the below precautions to protect her self and family members.  Sinus tachycardia -D-dimer not consistent with PE -Most likely secondary to patient's phentermine use for weight loss - Essential HTN -Amlodipine 10 mg daily -Losartan 100 mg daily -Metoprolol 12.5 mg BID  Hypokalemia -Resolved  Diabetes type 2 controlled with hyperglycemia -Patient meets guidelines for diabetes hemoglobin A1c> 6.4 -9/14 hemoglobin A1c= 6.7 -9/17 increase Lantus 20 units --9/18 increase NovoLog 16 units qac  -Metformin 500 mg BID --Ordered Starter  Kit --Ordered Glucometer   Morbid obesity  -Patient on an outside weight loss plan  Depression -Stable   Goals of care -9/18 PT evaluated patient no PT follow-up required    Discharge Diagnoses:  Active Problems:   Pneumonia due to COVID-19 virus   Depression   Hypokalemia   Acute on chronic respiratory failure with hypoxia (HCC)   Sinus tachycardia   Essential hypertension   Discharge Condition: Stable  Diet recommendation: Carb modified  Filed Weights   01/23/20 2328  Weight: 99 kg    History of present illness:  39 year old BF PMHx obesity managed with phentermine via a weight loss clinic, HTN, prediabetes, and recurrent hyperkalemia   who presented to the ED via EMS with progressively worsening dyspnea. She was diagnosed with Covid 01/16/2020. She was quarantining at home but then developed severe diffuse body aches chills and progressive dyspnea. She has been seen in the ED on multiple occasions since her initial diagnosis and received a monoclonal antibody infusion 9/12. She summoned EMS to her house on the date of this admission when she found her oxygen saturations to be 70%. CXR noted bilateral infiltrates consistent with Covid.  Hospital Course:  See above  Ventilator settings: Nasal cannula 9/19 Flow; 2 L/min SPO2 98%   Cultures   9//18 SARS coronavirus positive   Antibiotics Anti-infectives (From admission, onward)   Start     Ordered Stop   01/24/20 1000  remdesivir 100 mg in sodium chloride 0.9 % 100 mL IVPB       "Followed by" Linked Group Details   01/23/20 2342 01/27/20 1136   01/24/20 0800  cefTRIAXone (ROCEPHIN) 2 g in sodium chloride 0.9 % 100 mL IVPB  Status:  Discontinued  01/24/20 0734 01/24/20 0959   01/24/20 0800  azithromycin (ZITHROMAX) 500 mg in sodium chloride 0.9 % 250 mL IVPB  Status:  Discontinued        01/24/20 0734 01/24/20 0959   01/23/20 2345  remdesivir 200 mg in sodium chloride 0.9% 250 mL IVPB        "Followed by" Linked Group Details   01/23/20 2342 01/24/20 0100       Discharge Exam: Vitals:   01/28/20 1000 01/28/20 1420 01/28/20 2046 01/29/20 0511  BP:  113/83 (!) 145/92 134/84  Pulse:  89 (!) 104 79  Resp: _0 Temp:  97.6 F (36.4 C) 98.1 F (36.7 C) 97.9 F (36.6 C)  TempSrc:      SpO2:  95% 96% 98%  Weight:      Height:        General: A/O x4, positive acute respiratory distress Eyes: negative scleral hemorrhage, negative anisocoria, negative icterus ENT: Negative Runny nose, negative gingival bleeding, Neck:  Negative scars, masses, torticollis, lymphadenopathy, JVD Lungs Little to no breath sounds bilaterally without wheezes or crackles Cardiovascular: Regular rate and rhythm without murmur gallop or rub normal S1 and S2  Discharge Instructions  Discharge Instructions    Diet - low sodium heart healthy   Complete by: As directed    Discharge instructions   Complete by: As directed    ?   Person Under Monitoring Name: Robin Davis  Location: [address] [place] [address]   Infection Prevention Recommendations for Individuals Confirmed to have, or Being Evaluated for, 2019 Novel Coronavirus (COVID-19) Infection Who Receive Care at Home  Individuals who are confirmed to have, or are being evaluated for, COVID-19 should follow the prevention steps below until a healthcare provider or local or state health department says they can return to normal activities.  Stay home except to get medical care You should restrict activities outside your home, except for getting medical care. Do not go to work, school, or public areas, and do not use public transportation or taxis.  Call ahead before visiting your doctor Before your medical appointment, call the healthcare provider and tell them that you have, or are being evaluated for, COVID-19 infection. This will help the healthcare provider's office take steps to keep other  people from getting infected. Ask your healthcare provider to call the local or state health department.  Monitor your symptoms Seek prompt medical attention if your illness is worsening (e.g., difficulty breathing). Before going to your medical appointment, call the healthcare provider and tell them that you have, or are being evaluated for, COVID-19 infection. Ask your healthcare provider to call the local or state health department.  Wear a facemask You should wear a facemask that covers your nose and mouth when you are in the same room with other people and when you visit a healthcare provider. People who live with or visit you should also wear a facemask while they are in the same room with you.  Separate yourself from other people in your home As much as possible, you should stay in a different room from other people in your home. Also, you should use a separate bathroom, if available.  Avoid sharing household items You should not share dishes, drinking glasses, cups, eating utensils, towels, bedding, or other items with other people in your home. After using these items, you should wash them thoroughly with soap and water.  Cover your coughs and sneezes Cover your  mouth and nose with a tissue when you cough or sneeze, or you can cough or sneeze into your sleeve. Throw used tissues in a lined trash can, and immediately wash your hands with soap and water for at least 20 seconds or use an alcohol-based hand rub.  Wash your [facility] your hands often and thoroughly with soap and water for at least 20 seconds. You can use an alcohol-based hand sanitizer if soap and water are not available and if your hands are not visibly dirty. Avoid touching your eyes, nose, and mouth with unwashed hands.   Prevention Steps for Caregivers and Household Members of Individuals Confirmed to have, or Being Evaluated for, COVID-19 Infection Being Cared for in the Home  If you live with, or provide  care at home for, a person confirmed to have, or being evaluated for, COVID-19 infection please follow these guidelines to prevent infection:  Follow healthcare provider's instructions Make sure that you understand and can help the patient follow any healthcare provider instructions for all care.  Provide for the patient's basic needs You should help the patient with basic needs in the home and provide support for getting groceries, prescriptions, and other personal needs.  Monitor the patient's symptoms If they are getting sicker, call his or her medical provider and tell them that the patient has, or is being evaluated for, COVID-19 infection. This will help the healthcare provider's office take steps to keep other people from getting infected. Ask the healthcare provider to call the local or state health department.  Limit the number of people who have contact with the patient If possible, have only one caregiver for the patient. Other household members should stay in another home or place of residence. If this is not possible, they should stay in another room, or be separated from the patient as much as possible. Use a separate bathroom, if available. Restrict visitors who do not have an essential need to be in the home.  Keep older adults, very young children, and other sick people away from the patient Keep older adults, very young children, and those who have compromised immune systems or chronic health conditions away from the patient. This includes people with chronic heart, lung, or kidney conditions, diabetes, and cancer.  Ensure good ventilation Make sure that shared spaces in the home have good air flow, such as from an air conditioner or an opened window, weather permitting.  Wash your hands often Wash your hands often and thoroughly with soap and water for at least 20 seconds. You can use an alcohol based hand sanitizer if soap and water are not available and if your hands  are not visibly dirty. Avoid touching your eyes, nose, and mouth with unwashed hands. Use disposable paper towels to dry your hands. If not available, use dedicated cloth towels and replace them when they become wet.  Wear a facemask and gloves Wear a disposable facemask at all times in the room and gloves when you touch or have contact with the patient's blood, body fluids, and/or secretions or excretions, such as sweat, saliva, sputum, nasal mucus, vomit, urine, or feces.  Ensure the mask fits over your nose and mouth tightly, and do not touch it during use. Throw out disposable facemasks and gloves after using them. Do not reuse. Wash your hands immediately after removing your facemask and gloves. If your personal clothing becomes contaminated, carefully remove clothing and launder. Wash your hands after handling contaminated clothing. Place all used disposable facemasks,  gloves, and other waste in a lined container before disposing them with other household waste. Remove gloves and wash your hands immediately after handling these items.  Do not share dishes, glasses, or other household items with the patient Avoid sharing household items. You should not share dishes, drinking glasses, cups, eating utensils, towels, bedding, or other items with a patient who is confirmed to have, or being evaluated for, COVID-19 infection. After the person uses these items, you should wash them thoroughly with soap and water.  Wash laundry thoroughly Immediately remove and wash clothes or bedding that have blood, body fluids, and/or secretions or excretions, such as sweat, saliva, sputum, nasal mucus, vomit, urine, or feces, on them. Wear gloves when handling laundry from the patient. Read and follow directions on labels of laundry or clothing items and detergent. In general, wash and dry with the warmest temperatures recommended on the label.  Clean all areas the individual has used often Clean all  touchable surfaces, such as counters, tabletops, doorknobs, bathroom fixtures, toilets, phones, keyboards, tablets, and bedside tables, every day. Also, clean any surfaces that may have blood, body fluids, and/or secretions or excretions on them. Wear gloves when cleaning surfaces the patient has come in contact with. Use a diluted bleach solution (e.g., dilute bleach with 1 part bleach and 10 parts water) or a household disinfectant with a label that says EPA-registered for coronaviruses. To make a bleach solution at home, add 1 tablespoon of bleach to 1 quart (4 cups) of water. For a larger supply, add  cup of bleach to 1 gallon (16 cups) of water. Read labels of cleaning products and follow recommendations provided on product labels. Labels contain instructions for safe and effective use of the cleaning product including precautions you should take when applying the product, such as wearing gloves or eye protection and making sure you have good ventilation during use of the product. Remove gloves and wash hands immediately after cleaning.  Monitor yourself for signs and symptoms of illness Caregivers and household members are considered close contacts, should monitor their health, and will be asked to limit movement outside of the home to the extent possible. Follow the monitoring steps for close contacts listed on the symptom monitoring form.   ? If you have additional questions, contact your local health department or call the epidemiologist on call at [phone] (available 24/7). ? This guidance is subject to change. For the most up-to-date guidance from CDC, please refer to their website: YouBlogs.pl   Increase activity slowly   Complete by: As directed      Allergies as of 01/29/2020      Reactions   Lisinopril Itching, Swelling, Cough   Nitrofurantoin Macrocrystal Shortness Of Breath, Other (See Comments)    dizziness Other reaction(s): Other (See Comments) dizziness   Tramadol Shortness Of Breath, Other (See Comments)   sweating   Flagyl [metronidazole Hcl] Other (See Comments)   Unknown, but patient thinks is causes dizziness & sweating, possible shortness of breath   Hydrocodone-acetaminophen Other (See Comments)   Other reaction(s): Nausea And Vomiting dizziness   Vicodin [hydrocodone-acetaminophen] Nausea And Vomiting, Other (See Comments)   dizziness      Medication List    STOP taking these medications   amoxicillin-clavulanate 875-125 MG tablet Commonly known as: AUGMENTIN   azithromycin 250 MG tablet Commonly known as: ZITHROMAX   losartan-hydrochlorothiazide 100-12.5 MG tablet Commonly known as: HYZAAR     TAKE these medications   albuterol 108 (90 Base) MCG/ACT inhaler  Commonly known as: VENTOLIN HFA Inhale 1-2 puffs into the lungs every 6 (six) hours as needed for wheezing or shortness of breath.   amLODipine 10 MG tablet Commonly known as: NORVASC Take 10 mg by mouth daily.   budesonide 180 MCG/ACT inhaler Commonly known as: PULMICORT Inhale 1 puff into the lungs in the morning and at bedtime.   cetirizine 10 MG tablet Commonly known as: ZyrTEC Allergy Take 1 tablet (10 mg total) by mouth daily.   fluticasone 50 MCG/ACT nasal spray Commonly known as: FLONASE Place 1 spray into both nostrils 2 (two) times daily.   fluvoxaMINE 50 MG tablet Commonly known as: LUVOX Take 50 mg by mouth daily.   guaiFENesin-dextromethorphan 100-10 MG/5ML syrup Commonly known as: ROBITUSSIN DM Take 5 mLs by mouth every 4 (four) hours as needed for cough.   insulin glargine 100 unit/mL Sopn Commonly known as: LANTUS Inject 20 Units into the skin daily.   insulin starter kit- pen needles Misc Use as directed   levonorgestrel 20 MCG/24HR IUD Commonly known as: MIRENA 1 each by Intrauterine route once. Implanted October 2014   losartan 100 MG tablet Commonly known  as: COZAAR Take 1 tablet (100 mg total) by mouth daily.   meloxicam 7.5 MG tablet Commonly known as: MOBIC Take 7.5 mg by mouth daily.   metFORMIN 500 MG tablet Commonly known as: GLUCOPHAGE Take 1 tablet (500 mg total) by mouth 2 (two) times daily with a meal.   metoprolol tartrate 25 MG tablet Commonly known as: LOPRESSOR Take 0.5 tablets (12.5 mg total) by mouth 2 (two) times daily.   NovoLOG FlexPen 100 UNIT/ML FlexPen Generic drug: insulin aspart Inject 16 Units into the skin 3 (three) times daily with meals.   phentermine 37.5 MG tablet Commonly known as: ADIPEX-P Take 37.5 mg by mouth daily.   predniSONE 10 MG tablet Commonly known as: DELTASONE Take 1 tablet (10 mg total) by mouth daily.            Durable Medical Equipment  (From admission, onward)         Start     Ordered   01/28/20 1850  For home use only DME oxygen  Once       Comments: SATURATION QUALIFICATIONS: (This note is used to comply with regulatory documentation for home oxygen) Patient Saturations on Room Air at Rest = 93% Patient Saturations on Room Air while Ambulating = 86% Patient Saturations on 2 Liters of oxygen while Ambulating = 90% Please briefly explain why patient needs home oxygen: After walking a few feet pt. desats below 87% pt. Will need oxygen to perform ADLs at home with desatting. -Patient meets criteria for home O2 -2 L O2 titrate to maintain SPO2> 88% -Provide Inogen home O2 portable concentrator  Question Answer Comment  Length of Need Lifetime   Mode or (Route) Nasal cannula   Liters per Minute 2   Frequency Continuous (stationary and portable oxygen unit needed)   Oxygen conserving device Yes   Oxygen delivery system Gas      01/28/20 1850         Allergies  Allergen Reactions  . Lisinopril Itching, Swelling and Cough  . Nitrofurantoin Macrocrystal Shortness Of Breath and Other (See Comments)    dizziness Other reaction(s): Other (See Comments) dizziness   . Tramadol Shortness Of Breath and Other (See Comments)    sweating  . Flagyl [Metronidazole Hcl] Other (See Comments)    Unknown, but patient thinks is causes dizziness &  sweating, possible shortness of breath  . Hydrocodone-Acetaminophen Other (See Comments)    Other reaction(s): Nausea And Vomiting dizziness  . Vicodin [Hydrocodone-Acetaminophen] Nausea And Vomiting and Other (See Comments)    dizziness      The results of significant diagnostics from this hospitalization (including imaging, microbiology, ancillary and laboratory) are listed below for reference.    Significant Diagnostic Studies: DG Chest Port 1 View  Result Date: 01/23/2020 CLINICAL DATA:  Shortness of breath, COVID EXAM: PORTABLE CHEST 1 VIEW COMPARISON:  01/22/2020 FINDINGS: Worsening airspace disease at the bases. No pleural effusion. Stable cardiomediastinal silhouette. No pneumothorax IMPRESSION: Worsening airspace disease at the bases concerning for pneumonia. Electronically Signed   By: Donavan Foil M.D.   On: 01/23/2020 23:53   DG Chest Port 1 View  Result Date: 01/22/2020 CLINICAL DATA:  Shortness of breath EXAM: PORTABLE CHEST 1 VIEW COMPARISON:  01/21/2020 FINDINGS: There is a new airspace opacity at the left lung base. The heart size is unremarkable. There is no pneumothorax. No large pleural effusion. No acute osseous abnormality. IMPRESSION: New airspace opacity at the left lung base compatible with pneumonia. Electronically Signed   By: Constance Holster M.D.   On: 01/22/2020 21:18   DG Chest Portable 1 View  Result Date: 01/21/2020 CLINICAL DATA:  39 year old who is COVID-19 positive, presenting with worsening shortness of breath on exertion and cough. EXAM: PORTABLE CHEST 1 VIEW COMPARISON:  01/19/2020 and earlier, including CTA chest 05/20/2018. FINDINGS: Suboptimal inspiration accounts for crowded bronchovascular markings, especially in the bases, and accentuates the cardiac silhouette. Taking  this into account, cardiomediastinal silhouette unremarkable and unchanged. Lungs clear. Bronchovascular markings normal. Pulmonary vascularity normal. No visible pleural effusions. No pneumothorax. IMPRESSION: Suboptimal inspiration. No acute cardiopulmonary disease. Electronically Signed   By: Evangeline Dakin M.D.   On: 01/21/2020 19:52   DG Chest Portable 1 View  Result Date: 01/19/2020 CLINICAL DATA:  COVID. EXAM: PORTABLE CHEST 1 VIEW COMPARISON:  10/12/2018. FINDINGS: Mediastinum and hilar structures normal. Heart size normal. Mild bibasilar subsegmental atelectasis. No pleural effusion or pneumothorax. IMPRESSION: Mild bibasilar subsegmental atelectasis, otherwise negative exam. Electronically Signed   By: Marcello Moores  Register   On: 01/19/2020 07:35    Microbiology: Recent Results (from the past 240 hour(s))  Blood Culture (routine x 2)     Status: None (Preliminary result)   Collection Time: 01/23/20 11:57 PM   Specimen: BLOOD  Result Value Ref Range Status   Specimen Description   Final    BLOOD LEFT ANTECUBITAL Performed at [facility] [address], [place], [place] [address]    Special Requests   Final    BOTTLES DRAWN AEROBIC AND ANAEROBIC Blood Culture adequate volume Performed at [facility] [address] [place], [place] [address]    Culture   Final    NO GROWTH 4 DAYS Performed at [facility], [place] [address] [place], [place] [address]    Report Status PENDING  Incomplete  Blood Culture (routine x 2)     Status: None (Preliminary result)   Collection Time: 01/23/20 11:57 PM   Specimen: BLOOD  Result Value Ref Range Status   Specimen Description   Final    BLOOD RIGHT ANTECUBITAL Performed at [facility] [address], [place], [place] [address]    Special Requests   Final    BOTTLES DRAWN AEROBIC AND ANAEROBIC Blood Culture adequate volume Performed at [facility]  [address] [place],  [place] [address]    Culture   Final    NO GROWTH 4 DAYS Performed at [facility], [place] [address] [place], [place] [address]    Report Status PENDING  Incomplete  SARS CORONAVIRUS 2 (TAT 6-24 HRS) Nasopharyngeal Nasopharyngeal Swab     Status: Abnormal   Collection Time: 01/28/20  1:12 PM   Specimen: Nasopharyngeal Swab  Result Value Ref Range Status   SARS Coronavirus 2 POSITIVE (A) NEGATIVE Final    Comment: RESULT CALLED TO, READ BACK BY AND VERIFIED WITH: RN C WILLIAMS _0  01/29/20 BY S GEZAHEGN Performed at [facility], [place] [address], [place], [place] [address]      Labs: Basic Metabolic Panel: Recent Labs  Lab 01/24/20 0207 01/24/20 0443 01/25/20 0610 01/26/20 0404 01/27/20 0351 01/28/20 0431 01/29/20 0651  NA  --    < > 138 137 134* 135 139  K  --    < > 4.2 3.8 3.9 3.8 3.6  CL  --    < > 104 104 102 101 104  CO2  --    < > _1 GLUCOSE  --    < > 198* 217* 246* 263* 136*  BUN  --    < > _2 CREATININE  --    < > 0.69 0.59 0.62 0.72 0.78  CALCIUM  --    < > 9.3 9.1 8.7* 8.8* 8.8*  MG 1.9  --   --  2.2 2.2 2.3 2.3  PHOS  --   --   --  3.7 3.3 3.0 3.6   < > = values in this interval not displayed.   Liver Function Tests: Recent Labs  Lab 01/25/20 0610 01/26/20 0404 01/27/20 0351 01/28/20 0431 01/29/20 0651  AST 32 24 22 14* 14*  ALT 36 31 46* 32 30  ALKPHOS 60 58 63 59 51  BILITOT 0.6 0.6 0.7 0.8 0.6  PROT 8.0 6.9 6.9 6.4* 6.2*  ALBUMIN 3.6 3.2* 3.3* 3.2* 3.2*   No results for input(s): LIPASE, AMYLASE in the last 168 hours. No results for input(s): AMMONIA in the last 168 hours. CBC: Recent Labs  Lab 01/25/20 0610 01/26/20 0404 01/27/20 0351 01/28/20 0431 01/29/20 0651  WBC 12.3* 16.9* 19.8* 19.9* 19.7*  NEUTROABS 9.2* 13.4* 14.4* 12.6* 9.8*  HGB 13.6 13.3 13.7 13.6 13.9  HCT 40.2 41.0 40.8 40.7 41.9  MCV 82.0 83.5 82.1 82.6 82.2  PLT 341 414* 418* 498* 521*   Cardiac Enzymes: No  results for input(s): CKTOTAL, CKMB, CKMBINDEX, TROPONINI in the last 168 hours. BNP: BNP (last 3 results) No results for input(s): BNP in the last 8760 hours.  ProBNP (last 3 results) No results for input(s): PROBNP in the last 8760 hours.  CBG: Recent Labs  Lab 01/28/20 1200 01/28/20 1625 01/28/20 2046 01/29/20 0043 01/29/20 0717  GLUCAP 252* 394* 254* 112* 135*       Signed:  Dia Crawford, MD [facility] [phone] pager

## 2020-01-29 NOTE — Progress Notes (Signed)
CRITICAL VALUE ALERT  Critical Value:  + covid swab from 9/18  Date & Time Notied:  01/29/2020 at 0630  Provider Notified: notification not necessary- patient wanted retested to help gauze isolation at home around her family  Orders Received/Actions taken: none

## 2020-01-29 NOTE — TOC Transition Note (Signed)
Transition of Care [facility]) - CM/SW Discharge Note   Patient Details  Name: Robin Davis MRN: [id number] Date of Birth: [birth year]  Transition of Care [facility]) CM/SW Contact:  Elliot Gault, LCSW Phone Number: 01/29/2020, 10:25 AM   Clinical Narrative:     Pt with orders for dc home today. Pt in need of Home O2 for dc. Arranged with [employer]. Portable tank will be delivered to pt's room. Once concentrator delivered to pt's home, pt can dc. RN updated.  There are no other TOC needs identified for dc.  Final next level of care: Home/Self Care Barriers to Discharge: Barriers Resolved   Patient Goals and CMS Choice        Discharge Placement                       Discharge Plan and Services                DME Arranged: Oxygen DME Agency: [employer] Date DME Agency Contacted: 01/29/20 Time DME Agency Contacted: 1025 Representative spoke with at DME Agency: on call rep            Social Determinants of Health (SDOH) Interventions     Readmission Risk Interventions No flowsheet data found.

## 2020-04-22 ENCOUNTER — Ambulatory Visit (HOSPITAL_COMMUNITY)
Admission: EM | Admit: 2020-04-22 | Discharge: 2020-04-22 | Disposition: A | Discharge status: Home / Self Care | Payer: Medicaid Other | Attending: Family Medicine | Admitting: Family Medicine

## 2020-04-22 ENCOUNTER — Other Ambulatory Visit: Payer: Self-pay

## 2020-04-22 ENCOUNTER — Encounter (HOSPITAL_COMMUNITY): Payer: Self-pay

## 2020-04-22 DIAGNOSIS — L0291 Cutaneous abscess, unspecified: Secondary | ICD-10-CM | POA: Diagnosis not present

## 2020-04-22 MED ORDER — DOXYCYCLINE HYCLATE 100 MG PO CAPS: 100.0000 mg | ORAL_CAPSULE | Freq: Two times a day (BID) | ORAL | 0 refills | Status: AC

## 2020-04-22 NOTE — Discharge Instructions (Addendum)
Keep doing the warm compresses.  Doxycycline as prescribed. Follow up as needed for continued or worsening symptoms

## 2020-04-22 NOTE — ED Triage Notes (Signed)
Pt present several abscess underneath her left armpit. Pt states the area is warm to touch and  Several harden boils around the area of the first big boil.

## 2020-04-22 NOTE — ED Provider Notes (Signed)
[facility]    CSN: [id number] Arrival date & time: 04/22/20  1341      History   Chief Complaint Chief Complaint  Patient presents with   Abscess    Left arm pit    HPI Robin Davis is a 39 y.o. female.   Patient is a 39 year old female presents today with abscess to left axilla area.  This is been present worsening for the past week.  Has been using over-the-counter remedies without much relief.  Has also been doing warm compresses.  Had similar abscess to groin area which opened up and drained on its own.  No fevers, chills, nausea.      Past Medical History:  Diagnosis Date   Anxiety    COVID-19    Foot pain    Hypertension    Obesity     Patient Active Problem List   Diagnosis Date Noted   Sinus tachycardia 01/27/2020   Essential hypertension 01/27/2020   Pneumonia due to COVID-19 virus 01/24/2020   Depression 01/24/2020   Hypokalemia 01/24/2020   Acute on chronic respiratory failure with hypoxia ([place]) 01/24/2020   Snoring 02/22/2019    Past Surgical History:  Procedure Laterality Date   BREAST REDUCTION SURGERY     CHOLECYSTECTOMY      OB History    Gravida  5   Para  3   Term  2   Preterm  1   AB  2   Living  2     SAB      IAB      Ectopic      Multiple      Live Births               Home Medications    Prior to Admission medications   Medication Sig Start Date End Date Taking? Authorizing Provider  albuterol (PROVENTIL HFA;VENTOLIN HFA) 108 (90 BASE) MCG/ACT inhaler Inhale 1-2 puffs into the lungs every 6 (six) hours as needed for wheezing or shortness of breath. 04/15/14   Charlann Lange, PA-C  amLODipine (NORVASC) 10 MG tablet Take 10 mg by mouth daily. 12/28/19   [provider]  budesonide (PULMICORT) 180 MCG/ACT inhaler Inhale 1 puff into the lungs in the morning and at bedtime. 01/22/20   Corena Herter, PA-C  cetirizine (ZYRTEC ALLERGY) 10 MG tablet Take 1 tablet (10 mg  total) by mouth daily. 01/16/20   Volney American, PA-C  doxycycline (VIBRAMYCIN) 100 MG capsule Take 1 capsule (100 mg total) by mouth 2 (two) times daily for 7 days. 04/22/20 04/29/20  Loura Halt A, NP  fluticasone (FLONASE) 50 MCG/ACT nasal spray Place 1 spray into both nostrils 2 (two) times daily. Patient not taking: Reported on 01/24/2020 01/16/20   Volney American, PA-C  fluvoxaMINE (LUVOX) 50 MG tablet Take 50 mg by mouth daily. 10/15/15   [provider]  guaiFENesin-dextromethorphan (ROBITUSSIN DM) 100-10 MG/5ML syrup Take 5 mLs by mouth every 4 (four) hours as needed for cough. 01/29/20   Allie Bossier, MD  insulin aspart (NOVOLOG FLEXPEN) 100 UNIT/ML FlexPen Inject 16 Units into the skin 3 (three) times daily with meals. 01/29/20   Allie Bossier, MD  insulin glargine (LANTUS) 100 unit/mL SOPN Inject 20 Units into the skin daily. 01/29/20   Allie Bossier, MD  insulin starter kit- pen needles MISC Use as directed 01/29/20   Allie Bossier, MD  levonorgestrel [facility]) 20 MCG/24HR IUD 1 each by Intrauterine  route once. Implanted October 2014    [provider]  losartan (COZAAR) 100 MG tablet Take 1 tablet (100 mg total) by mouth daily. 01/29/20   Allie Bossier, MD  meloxicam (MOBIC) 7.5 MG tablet Take 7.5 mg by mouth daily.    [provider]  metFORMIN (GLUCOPHAGE) 500 MG tablet Take 1 tablet (500 mg total) by mouth 2 (two) times daily with a meal. 01/29/20   Allie Bossier, MD  metoprolol tartrate (LOPRESSOR) 25 MG tablet Take 0.5 tablets (12.5 mg total) by mouth 2 (two) times daily. 01/29/20   Allie Bossier, MD  phentermine (ADIPEX-P) 37.5 MG tablet Take 37.5 mg by mouth daily. 03/13/14   [provider]  predniSONE (DELTASONE) 10 MG tablet Take 1 tablet (10 mg total) by mouth daily. 01/29/20   Allie Bossier, MD    Family History Family History  Problem Relation Age of Onset   Hypertension Other    Diabetes Other     Social  History Social History   Tobacco Use   Smoking status: Never Smoker   Smokeless tobacco: Never Used  Substance Use Topics   Alcohol use: Yes    Comment: seldom   Drug use: No     Allergies   Lisinopril, Nitrofurantoin macrocrystal, Tramadol, Flagyl [metronidazole hcl], Hydrocodone-acetaminophen, and Vicodin [hydrocodone-acetaminophen]   Review of Systems Review of Systems   Physical Exam Triage Vital Signs ED Triage Vitals  Enc Vitals Group     BP 04/22/20 1402 135/84     Pulse Rate 04/22/20 1402 97     Resp 04/22/20 1402 16     Temp 04/22/20 1402 98 F (36.7 C)     Temp Source 04/22/20 1402 Oral     SpO2 04/22/20 1402 100 %     Weight --      Height --      Head Circumference --      Peak Flow --      Pain Score 04/22/20 1404 9     Pain Loc --      Pain Edu? --      Excl. in [place]? --    No data found.  Updated Vital Signs BP 135/84 (BP Location: Left Arm)    Pulse 97    Temp 98 F (36.7 C) (Oral)    Resp 16    SpO2 100%   Visual Acuity Right Eye Distance:   Left Eye Distance:   Bilateral Distance:    Right Eye Near:   Left Eye Near:    Bilateral Near:     Physical Exam Vitals and nursing note reviewed.  Constitutional:      General: She is not in acute distress.    Appearance: Normal appearance. She is not ill-appearing, toxic-appearing or diaphoretic.  HENT:     Head: Normocephalic.     Nose: Nose normal.  Eyes:     Conjunctiva/sclera: Conjunctivae normal.  Pulmonary:     Effort: Pulmonary effort is normal.  Musculoskeletal:        General: Normal range of motion.     Cervical back: Normal range of motion.  Skin:    General: Skin is warm and dry.     Findings: No rash.     Comments: Approximated 2 to 3 cm abscess to left axilla area with central fluctuance and surrounding induration and erythema.  No draining  Neurological:     Mental Status: She is alert.  Psychiatric:  Mood and Affect: Mood normal.      UC Treatments /  Results  Labs (all labs ordered are listed, but only abnormal results are displayed) Labs Reviewed - No data to display  EKG   Radiology No results found.  Procedures Procedures (including critical care time)  Medications Ordered in UC Medications - No data to display  Initial Impression / Assessment and Plan / UC Course  I have reviewed the triage vital signs and the nursing notes.  Pertinent labs & imaging results that were available during my care of the patient were reviewed by me and considered in my medical decision making (see chart for details).     Abscess Continue the warm compresses.  Doxycycline as prescribed. Follow up as needed for continued or worsening symptoms  Final Clinical Impressions(s) / UC Diagnoses   Final diagnoses:  Abscess     Discharge Instructions     Keep doing the warm compresses.  Doxycycline as prescribed. Follow up as needed for continued or worsening symptoms     ED Prescriptions    Medication Sig Dispense Auth. Provider   doxycycline (VIBRAMYCIN) 100 MG capsule Take 1 capsule (100 mg total) by mouth 2 (two) times daily for 7 days. 14 capsule Gaylene Moylan A, NP     PDMP not reviewed this encounter.   Orvan July, NP 04/22/20 1515

## 2021-05-19 ENCOUNTER — Encounter (HOSPITAL_BASED_OUTPATIENT_CLINIC_OR_DEPARTMENT_OTHER): Payer: Self-pay | Admitting: Obstetrics and Gynecology

## 2021-05-19 ENCOUNTER — Other Ambulatory Visit: Payer: Self-pay

## 2021-05-19 ENCOUNTER — Emergency Department (HOSPITAL_BASED_OUTPATIENT_CLINIC_OR_DEPARTMENT_OTHER)
Admission: EM | Admit: 2021-05-19 | Discharge: 2021-05-19 | Disposition: A | Discharge status: Home / Self Care | Payer: Medicaid Other | Attending: Emergency Medicine | Admitting: Emergency Medicine

## 2021-05-19 DIAGNOSIS — Z7984 Long term (current) use of oral hypoglycemic drugs: Secondary | ICD-10-CM | POA: Insufficient documentation

## 2021-05-19 DIAGNOSIS — Z794 Long term (current) use of insulin: Secondary | ICD-10-CM | POA: Diagnosis not present

## 2021-05-19 DIAGNOSIS — Z20822 Contact with and (suspected) exposure to covid-19: Secondary | ICD-10-CM | POA: Insufficient documentation

## 2021-05-19 DIAGNOSIS — Z8616 Personal history of COVID-19: Secondary | ICD-10-CM | POA: Insufficient documentation

## 2021-05-19 DIAGNOSIS — R519 Headache, unspecified: Secondary | ICD-10-CM | POA: Diagnosis not present

## 2021-05-19 DIAGNOSIS — Z79899 Other long term (current) drug therapy: Secondary | ICD-10-CM | POA: Diagnosis not present

## 2021-05-19 DIAGNOSIS — R059 Cough, unspecified: Secondary | ICD-10-CM | POA: Insufficient documentation

## 2021-05-19 LAB — RESP PANEL BY RT-PCR (FLU A&B, COVID) ARPGX2
Influenza A by PCR: NEGATIVE
Influenza B by PCR: NEGATIVE
SARS Coronavirus 2 by RT PCR: NEGATIVE

## 2021-05-19 MED ORDER — OXYMETAZOLINE HCL 0.05 % NA SOLN
1.0000 | Freq: Once | NASAL | Status: AC
  Administered 2021-05-19: 1 via NASAL
  Filled 2021-05-19: qty 30

## 2021-05-19 MED ORDER — ACETAMINOPHEN 325 MG PO TABS
650.0000 mg | ORAL_TABLET | Freq: Once | ORAL | Status: AC
  Administered 2021-05-19: 650 mg via ORAL
  Filled 2021-05-19: qty 2

## 2021-05-19 NOTE — ED Provider Notes (Signed)
[facility] EMERGENCY DEPT Provider Note   CSN: [id number] Arrival date & time: 05/19/21  1642     History  Chief Complaint  Patient presents with   Headache    Robin Davis is a 41 y.o. female.  HPI 41 year old female presents today complaining of right-sided headache.  This began yesterday on awakening.  She has noted some pressure in her face.  She feels that her ear is draining on the left side and coughs when she feels something trickling down her throat.  She has not had fever, chills, sore throat, or dyspnea.  She had COVID several years ago not had COVID-vaccine.  Reports that she does have headaches at times.  She has taken no interventions.  She does not have any lateralized weakness or trauma.  She is not on blood thinners    Home Medications Prior to Admission medications   Medication Sig Start Date End Date Taking? Authorizing Provider  albuterol (PROVENTIL HFA;VENTOLIN HFA) 108 (90 BASE) MCG/ACT inhaler Inhale 1-2 puffs into the lungs every 6 (six) hours as needed for wheezing or shortness of breath. 04/15/14   Charlann Lange, PA-C  amLODipine (NORVASC) 10 MG tablet Take 10 mg by mouth daily. 12/28/19   [provider]  budesonide (PULMICORT) 180 MCG/ACT inhaler Inhale 1 puff into the lungs in the morning and at bedtime. 01/22/20   Corena Herter, PA-C  cetirizine (ZYRTEC ALLERGY) 10 MG tablet Take 1 tablet (10 mg total) by mouth daily. 01/16/20   Volney American, PA-C  fluticasone [facility]) 50 MCG/ACT nasal spray Place 1 spray into both nostrils 2 (two) times daily. Patient not taking: Reported on 01/24/2020 01/16/20   Volney American, PA-C  fluvoxaMINE (LUVOX) 50 MG tablet Take 50 mg by mouth daily. 10/15/15   [provider]  guaiFENesin-dextromethorphan (ROBITUSSIN DM) 100-10 MG/5ML syrup Take 5 mLs by mouth every 4 (four) hours as needed for cough. 01/29/20   Allie Bossier, MD  insulin aspart (NOVOLOG FLEXPEN) 100 UNIT/ML  FlexPen Inject 16 Units into the skin 3 (three) times daily with meals. 01/29/20   Allie Bossier, MD  insulin glargine (LANTUS) 100 unit/mL SOPN Inject 20 Units into the skin daily. 01/29/20   Allie Bossier, MD  insulin starter kit- pen needles MISC Use as directed 01/29/20   Allie Bossier, MD  levonorgestrel [facility]) 20 MCG/24HR IUD 1 each by Intrauterine route once. Implanted October 2014    [provider]  losartan (COZAAR) 100 MG tablet Take 1 tablet (100 mg total) by mouth daily. 01/29/20   Allie Bossier, MD  meloxicam (MOBIC) 7.5 MG tablet Take 7.5 mg by mouth daily.    [provider]  metFORMIN (GLUCOPHAGE) 500 MG tablet Take 1 tablet (500 mg total) by mouth 2 (two) times daily with a meal. 01/29/20   Allie Bossier, MD  metoprolol tartrate (LOPRESSOR) 25 MG tablet Take 0.5 tablets (12.5 mg total) by mouth 2 (two) times daily. 01/29/20   Allie Bossier, MD  phentermine (ADIPEX-P) 37.5 MG tablet Take 37.5 mg by mouth daily. 03/13/14   [provider]  predniSONE (DELTASONE) 10 MG tablet Take 1 tablet (10 mg total) by mouth daily. 01/29/20   Allie Bossier, MD      Allergies    Lisinopril, Nitrofurantoin macrocrystal, Tramadol, Flagyl [metronidazole hcl], Hydrocodone-acetaminophen, and Vicodin [hydrocodone-acetaminophen]    Review of Systems   Review of Systems  All other systems reviewed and are negative.  Physical Exam Updated Vital Signs BP 131/70    Pulse 100    Temp 98.3 F (36.8 C) (Oral)    Resp 14    Ht 1.575 m (5' 2")    Wt 99.8 kg    SpO2 100%    BMI 40.24 kg/m  Physical Exam Vitals and nursing note reviewed.  Constitutional:      Appearance: She is well-developed.  HENT:     Head: Normocephalic.     Comments: Mild tenderness palpation of her bilateral maxillary sinuses    Mouth/Throat:     Mouth: Mucous membranes are moist.  Eyes:     Extraocular Movements: Extraocular movements intact.     Pupils: Pupils are equal, round, and  reactive to light.  Cardiovascular:     Rate and Rhythm: Normal rate.     Heart sounds: Normal heart sounds.  Pulmonary:     Effort: Pulmonary effort is normal.     Breath sounds: Normal breath sounds.  Abdominal:     Palpations: Abdomen is soft.  Musculoskeletal:        General: Normal range of motion.     Cervical back: Normal range of motion and neck supple.  Skin:    General: Skin is warm.  Neurological:     Mental Status: She is alert.     Cranial Nerves: No cranial nerve deficit.     Sensory: No sensory deficit.     Deep Tendon Reflexes: Reflexes normal.  Psychiatric:        Mood and Affect: Mood normal.        Behavior: Behavior normal.    ED Results / Procedures / Treatments   Labs (all labs ordered are listed, but only abnormal results are displayed) Labs Reviewed  RESP PANEL BY RT-PCR (FLU A&B, COVID) ARPGX2    EKG None  Radiology No results found.  Procedures Procedures    Medications Ordered in ED Medications  oxymetazoline (AFRIN) 0.05 % nasal spray 1 spray (1 spray Each Nare Given 05/19/21 1738)  acetaminophen (TYLENOL) tablet 650 mg (650 mg Oral Given 05/19/21 1737)    ED Course/ Medical Decision Making/ A&P                           Medical Decision Making  41 year old female presents today with some headache and facial discomfort.  She has have history of sinusitis.  She does not appear to have acute sinusitis with no fever, discolored nasal discharge, redness of her face.  Her exam is normal.  She has mild tenderness over her ethmoid sinuses.  She is given Afrin here in the ED and Tylenol.  She is advised regarding return precautions and need for follow-up and voices understanding.        Final Clinical Impression(s) / ED Diagnoses Final diagnoses:  Nonintractable headache, unspecified chronicity pattern, unspecified headache type    Rx / DC Orders ED Discharge Orders     None         Pattricia Boss, MD 05/19/21 1753

## 2021-05-19 NOTE — ED Triage Notes (Signed)
Patient presents to the ER for c/o head pressure. Patient reports her eyes feel like "they are going to pop" and she has drainage in her throat. Patient reports this has been going on x2-3 days.

## 2021-05-19 NOTE — Discharge Instructions (Signed)
You appear to have some component of sinus pressure causing your headache.  I do not see any evidence of acute infection.  Please continue the Afrin up to twice a day for total of 3 days. Continue acetaminophen over-the-counter .  Please recheck with your doctor in the next 2 to 3 days. Please return if you are having any worsening symptoms especially severe headache, neck stiffness, or fever.

## 2021-08-12 ENCOUNTER — Ambulatory Visit (HOSPITAL_COMMUNITY)
Admission: EM | Admit: 2021-08-12 | Discharge: 2021-08-12 | Disposition: A | Discharge status: Home / Self Care | Payer: Medicaid Other | Attending: Physician Assistant | Admitting: Physician Assistant

## 2021-08-12 ENCOUNTER — Encounter (HOSPITAL_COMMUNITY): Payer: Self-pay

## 2021-08-12 DIAGNOSIS — B029 Zoster without complications: Secondary | ICD-10-CM | POA: Diagnosis not present

## 2021-08-12 MED ORDER — VALACYCLOVIR HCL 1 G PO TABS: 1000.0000 mg | ORAL_TABLET | Freq: Three times a day (TID) | ORAL | 0 refills | Status: AC

## 2021-08-12 NOTE — ED Triage Notes (Signed)
Onset 4 days ago with rash under stomach and left side groin pain radiating down her leg.. No history of falls or injury to report. ?

## 2021-08-12 NOTE — Discharge Instructions (Signed)
Take medication as prescribed ?Return if no improvement or symptoms worsen.  ?

## 2021-08-12 NOTE — ED Provider Notes (Signed)
?[place] ? ? ? ?CSN: [id number] ?Arrival date & time: 08/12/21  1927 ? ? ?  ? ?History   ?Chief Complaint ?Chief Complaint  ?Patient presents with  ? Rash  ? Groin Pain  ? ? ?HPI ?Robin Davis is a 41 y.o. female.  ? ?Pt complains of a vesicular rash to left groin that started 4 days ago.  Reports initial no rash noted with prodrome of burning and pain in the area.  Rash in no other location.  She has applied lidocaine cream with temporary relief.   ? ? ?Past Medical History:  ?Diagnosis Date  ? Anxiety   ? COVID-19   ? Foot pain   ? Hypertension   ? Obesity   ? ? ?Patient Active Problem List  ? Diagnosis Date Noted  ? Sinus tachycardia 01/27/2020  ? Essential hypertension 01/27/2020  ? Pneumonia due to COVID-19 virus 01/24/2020  ? Depression 01/24/2020  ? Hypokalemia 01/24/2020  ? Acute on chronic respiratory failure with hypoxia ([place]) 01/24/2020  ? Snoring 02/22/2019  ? ? ?Past Surgical History:  ?Procedure Laterality Date  ? BREAST REDUCTION SURGERY    ? CHOLECYSTECTOMY    ? ? ?OB History   ? ? Gravida  ?5  ? Para  ?3  ? Term  ?2  ? Preterm  ?1  ? AB  ?2  ? Living  ?2  ?  ? ? SAB  ?   ? IAB  ?   ? Ectopic  ?   ? Multiple  ?   ? Live Births  ?   ?   ?  ?  ? ? ? ?Home Medications   ? ?Prior to Admission medications   ?Medication Sig Start Date End Date Taking? Authorizing Provider  ?valACYclovir (VALTREX) 1000 MG tablet Take 1 tablet (1,000 mg total) by mouth 3 (three) times daily for 7 days. 08/12/21 08/19/21 Yes Ward, Lenise Arena, PA-C  ?albuterol (PROVENTIL HFA;VENTOLIN HFA) 108 (90 BASE) MCG/ACT inhaler Inhale 1-2 puffs into the lungs every 6 (six) hours as needed for wheezing or shortness of breath. 04/15/14   Charlann Lange, PA-C  ?amLODipine (NORVASC) 10 MG tablet Take 10 mg by mouth daily. 12/28/19   [provider]  ?budesonide (PULMICORT) 180 MCG/ACT inhaler Inhale 1 puff into the lungs in the morning and at bedtime. 01/22/20   Corena Herter, PA-C  ?cetirizine (ZYRTEC ALLERGY) 10 MG  tablet Take 1 tablet (10 mg total) by mouth daily. 01/16/20   Volney American, PA-C  ?fluticasone (FLONASE) 50 MCG/ACT nasal spray Place 1 spray into both nostrils 2 (two) times daily. ?Patient not taking: Reported on 01/24/2020 01/16/20   Volney American, PA-C  ?fluvoxaMINE (LUVOX) 50 MG tablet Take 50 mg by mouth daily. 10/15/15   [provider]  ?guaiFENesin-dextromethorphan (ROBITUSSIN DM) 100-10 MG/5ML syrup Take 5 mLs by mouth every 4 (four) hours as needed for cough. 01/29/20   Allie Bossier, MD  ?insulin aspart (NOVOLOG FLEXPEN) 100 UNIT/ML FlexPen Inject 16 Units into the skin 3 (three) times daily with meals. 01/29/20   Allie Bossier, MD  ?insulin glargine (LANTUS) 100 unit/mL SOPN Inject 20 Units into the skin daily. 01/29/20   Allie Bossier, MD  ?insulin starter kit- pen needles MISC Use as directed 01/29/20   Allie Bossier, MD  ?levonorgestrel [facility]) 20 MCG/24HR IUD 1 each by Intrauterine route once. Implanted October 2014    [provider]  ?losartan (COZAAR) 100 MG tablet  Take 1 tablet (100 mg total) by mouth daily. 01/29/20   Allie Bossier, MD  ?meloxicam (MOBIC) 7.5 MG tablet Take 7.5 mg by mouth daily.    [provider]  ?metFORMIN (GLUCOPHAGE) 500 MG tablet Take 1 tablet (500 mg total) by mouth 2 (two) times daily with a meal. 01/29/20   Allie Bossier, MD  ?metoprolol tartrate (LOPRESSOR) 25 MG tablet Take 0.5 tablets (12.5 mg total) by mouth 2 (two) times daily. 01/29/20   Allie Bossier, MD  ?phentermine (ADIPEX-P) 37.5 MG tablet Take 37.5 mg by mouth daily. 03/13/14   [provider]  ?predniSONE (DELTASONE) 10 MG tablet Take 1 tablet (10 mg total) by mouth daily. 01/29/20   Allie Bossier, MD  ? ? ?Family History ?Family History  ?Problem Relation Age of Onset  ? Hypertension Other   ? Diabetes Other   ? ? ?Social History ?Social History  ? ?Tobacco Use  ? Smoking status: Never  ?  Passive exposure: Never  ? Smokeless tobacco: Never  ?Vaping  Use  ? Vaping Use: Never used  ?Substance Use Topics  ? Alcohol use: Yes  ?  Comment: seldom  ? Drug use: No  ? ? ? ?Allergies   ?Lisinopril, Nitrofurantoin macrocrystal, Tramadol, Flagyl [metronidazole hcl], Hydrocodone-acetaminophen, and Vicodin [hydrocodone-acetaminophen] ? ? ?Review of Systems ?Review of Systems  ?Constitutional:  Negative for chills and fever.  ?HENT:  Negative for ear pain and sore throat.   ?Eyes:  Negative for pain and visual disturbance.  ?Respiratory:  Negative for cough and shortness of breath.   ?Cardiovascular:  Negative for chest pain and palpitations.  ?Gastrointestinal:  Negative for abdominal pain and vomiting.  ?Genitourinary:  Negative for dysuria and hematuria.  ?Musculoskeletal:  Negative for arthralgias and back pain.  ?Skin:  Positive for rash. Negative for color change.  ?Neurological:  Negative for seizures and syncope.  ?All other systems reviewed and are negative. ? ? ?Physical Exam ?Triage Vital Signs ?ED Triage Vitals [08/12/21 2007]  ?Enc Vitals Group  ?   BP 133/86  ?   Pulse Rate (!) 102  ?   Resp 16  ?   Temp 98.6 ?F (37 ?C)  ?   Temp Source Oral  ?   SpO2 100 %  ?   Weight   ?   Height   ?   Head Circumference   ?   Peak Flow   ?   Pain Score   ?   Pain Loc   ?   Pain Edu?   ?   Excl. in [place]?   ? ?No data found. ? ?Updated Vital Signs ?BP 133/86 (BP Location: Left Arm)   Pulse (!) 102   Temp 98.6 ?F (37 ?C) (Oral)   Resp 16   SpO2 100%  ? ?Visual Acuity ?Right Eye Distance:   ?Left Eye Distance:   ?Bilateral Distance:   ? ?Right Eye Near:   ?Left Eye Near:    ?Bilateral Near:    ? ?Physical Exam ?Vitals and nursing note reviewed.  ?Constitutional:   ?   General: She is not in acute distress. ?   Appearance: She is well-developed.  ?HENT:  ?   Head: Normocephalic and atraumatic.  ?Eyes:  ?   Conjunctiva/sclera: Conjunctivae normal.  ?Cardiovascular:  ?   Rate and Rhythm: Normal rate and regular rhythm.  ?   Heart sounds: No murmur heard. ?Pulmonary:  ?   Effort:  Pulmonary effort is normal.  No respiratory distress.  ?   Breath sounds: Normal breath sounds.  ?Abdominal:  ?   Palpations: Abdomen is soft.  ?   Tenderness: There is no abdominal tenderness.  ? ? ?Musculoskeletal:     ?   General: No swelling.  ?   Cervical back: Neck supple.  ?Skin: ?   General: Skin is warm and dry.  ?   Capillary Refill: Capillary refill takes less than 2 seconds.  ?Neurological:  ?   Mental Status: She is alert.  ?Psychiatric:     ?   Mood and Affect: Mood normal.  ? ? ? ?UC Treatments / Results  ?Labs ?(all labs ordered are listed, but only abnormal results are displayed) ?Labs Reviewed - No data to display ? ?EKG ? ? ?Radiology ?No results found. ? ?Procedures ?Procedures (including critical care time) ? ?Medications Ordered in UC ?Medications - No data to display ? ?Initial Impression / Assessment and Plan / UC Course  ?I have reviewed the triage vital signs and the nursing notes. ? ?Pertinent labs & imaging results that were available during my care of the patient were reviewed by me and considered in my medical decision making (see chart for details). ? ?  ? ?Shingles rash, valtrex prescribed.  Supportive care discussed. Return precautions discussed.  ?Final Clinical Impressions(s) / UC Diagnoses  ? ?Final diagnoses:  ?Herpes zoster without complication  ? ? ? ?Discharge Instructions   ? ?  ?Take medication as prescribed ?Return if no improvement or symptoms worsen.  ? ? ?ED Prescriptions   ? ? Medication Sig Dispense Auth. Provider  ? valACYclovir (VALTREX) 1000 MG tablet Take 1 tablet (1,000 mg total) by mouth 3 (three) times daily for 7 days. 21 tablet Ward, Lenise Arena, PA-C  ? ?  ? ?PDMP not reviewed this encounter. ?  ?Ward, Lenise Arena, PA-C ?08/12/21 2045 ? ?

## 2021-09-13 ENCOUNTER — Emergency Department (HOSPITAL_BASED_OUTPATIENT_CLINIC_OR_DEPARTMENT_OTHER)
Admission: EM | Admit: 2021-09-13 | Discharge: 2021-09-14 | Disposition: A | Discharge status: Home / Self Care | Payer: Medicaid Other | Attending: Emergency Medicine | Admitting: Emergency Medicine

## 2021-09-13 ENCOUNTER — Encounter (HOSPITAL_BASED_OUTPATIENT_CLINIC_OR_DEPARTMENT_OTHER): Payer: Self-pay

## 2021-09-13 ENCOUNTER — Other Ambulatory Visit: Payer: Self-pay

## 2021-09-13 ENCOUNTER — Emergency Department (HOSPITAL_BASED_OUTPATIENT_CLINIC_OR_DEPARTMENT_OTHER): Payer: Medicaid Other

## 2021-09-13 DIAGNOSIS — D72829 Elevated white blood cell count, unspecified: Secondary | ICD-10-CM | POA: Insufficient documentation

## 2021-09-13 DIAGNOSIS — R748 Abnormal levels of other serum enzymes: Secondary | ICD-10-CM | POA: Insufficient documentation

## 2021-09-13 DIAGNOSIS — Z794 Long term (current) use of insulin: Secondary | ICD-10-CM | POA: Diagnosis not present

## 2021-09-13 DIAGNOSIS — K5792 Diverticulitis of intestine, part unspecified, without perforation or abscess without bleeding: Secondary | ICD-10-CM | POA: Diagnosis not present

## 2021-09-13 DIAGNOSIS — R102 Pelvic and perineal pain: Secondary | ICD-10-CM | POA: Diagnosis present

## 2021-09-13 DIAGNOSIS — E876 Hypokalemia: Secondary | ICD-10-CM | POA: Insufficient documentation

## 2021-09-13 LAB — COMPREHENSIVE METABOLIC PANEL
ALT: 23 U/L (ref 0–44)
AST: 18 U/L (ref 15–41)
Albumin: 4.7 g/dL (ref 3.5–5.0)
Alkaline Phosphatase: 66 U/L (ref 38–126)
Anion gap: 11 (ref 5–15)
BUN: 12 mg/dL (ref 6–20)
CO2: 27 mmol/L (ref 22–32)
Calcium: 10 mg/dL (ref 8.9–10.3)
Chloride: 101 mmol/L (ref 98–111)
Creatinine, Ser: 0.73 mg/dL (ref 0.44–1.00)
GFR, Estimated: >60 mL/min (ref >60)
Glucose, Bld: 124 mg/dL — ABNORMAL HIGH (ref 70–99)
Potassium: 3 mmol/L — ABNORMAL LOW (ref 3.5–5.1)
Sodium: 139 mmol/L (ref 135–145)
Total Bilirubin: 0.5 mg/dL (ref 0.3–1.2)
Total Protein: 8 g/dL (ref 6.5–8.1)

## 2021-09-13 LAB — CBC
HCT: 41.2 % (ref 36.0–46.0)
Hemoglobin: 13.5 g/dL (ref 12.0–15.0)
MCH: 27.1 pg (ref 26.0–34.0)
MCHC: 32.8 g/dL (ref 30.0–36.0)
MCV: 82.6 fL (ref 80.0–100.0)
Platelets: 360 10*3/uL (ref 150–400)
RBC: 4.99 MIL/uL (ref 3.87–5.11)
RDW: 14.4 % (ref 11.5–15.5)
WBC: 11.8 10*3/uL — ABNORMAL HIGH (ref 4.0–10.5)
nRBC: 0 % (ref 0.0–0.2)

## 2021-09-13 LAB — URINALYSIS, ROUTINE W REFLEX MICROSCOPIC
Bilirubin Urine: NEGATIVE
Glucose, UA: NEGATIVE mg/dL
Hgb urine dipstick: NEGATIVE
Ketones, ur: NEGATIVE mg/dL
Leukocytes,Ua: NEGATIVE
Nitrite: NEGATIVE
Protein, ur: NEGATIVE mg/dL
Specific Gravity, Urine: 1.017 (ref 1.005–1.030)
pH: 6.5 (ref 5.0–8.0)

## 2021-09-13 LAB — LIPASE, BLOOD: Lipase: 73 U/L — ABNORMAL HIGH (ref 11–51)

## 2021-09-13 LAB — HCG, SERUM, QUALITATIVE: Preg, Serum: NEGATIVE

## 2021-09-13 IMAGING — CT CT ABD-PELV W/ CM
2 of 7 series · 16 of 46 positions shown, 18 images · IV contrast (APPLIED)
Comparison: CT abdomen and pelvis 03/04/2018.

CLINICAL DATA: Acute abdominal pain.

EXAM:
CT ABDOMEN AND PELVIS WITH CONTRAST
TECHNIQUE: Multidetector CT imaging of the abdomen and pelvis was performed
using the standard protocol following bolus administration of
intravenous contrast.

[Series 5: coronal · coronal · 0.77mm/px · 3 of 114 slices shown]
[im 38/114  soft-tissue]
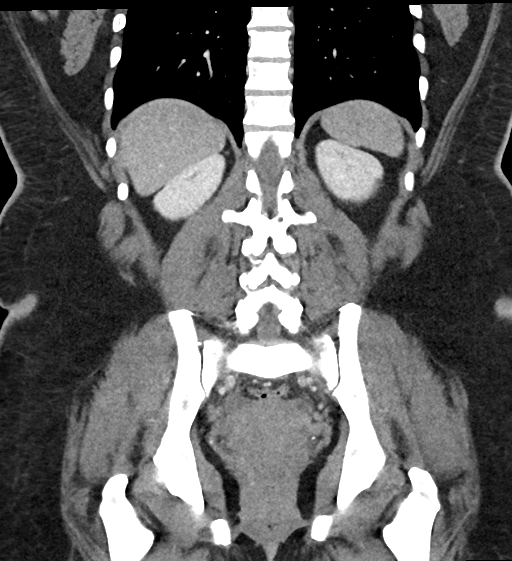
[im 51/114  soft-tissue]
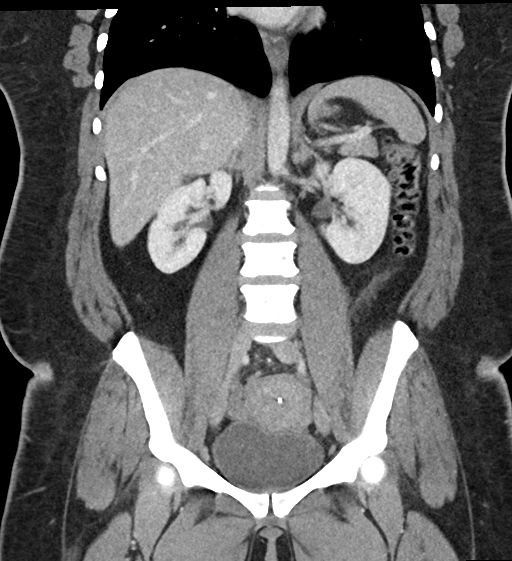
[im 63/114  soft-tissue]
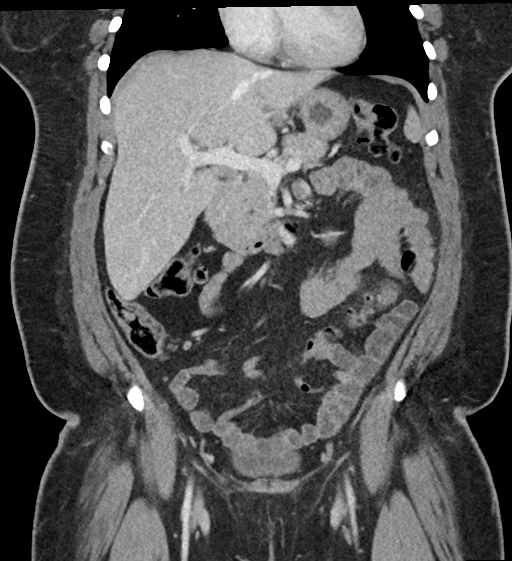

[Series 9: thins · axial · 0.77mm/px · z∈[+541,+923]mm · 13 of 640 slices shown, 15 images]
[im 26/640  soft-tissue]
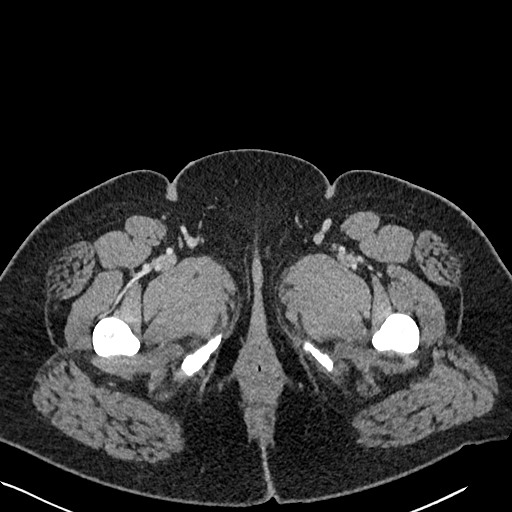
[im 26/640  bone]
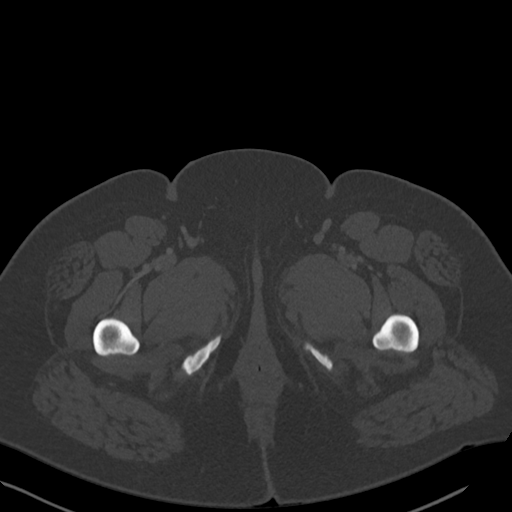
[im 77/640  soft-tissue]
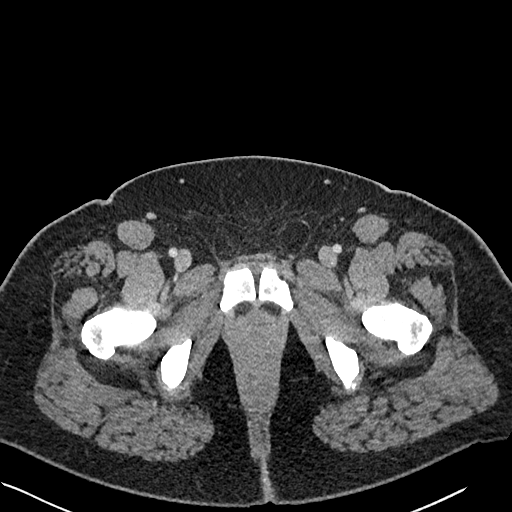
[im 128/640  soft-tissue]
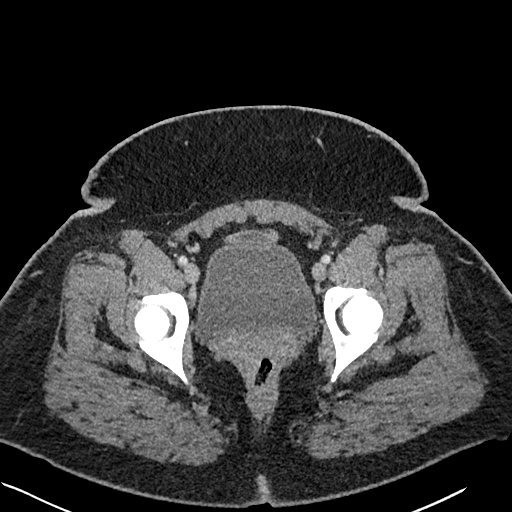
[im 179/640  soft-tissue]
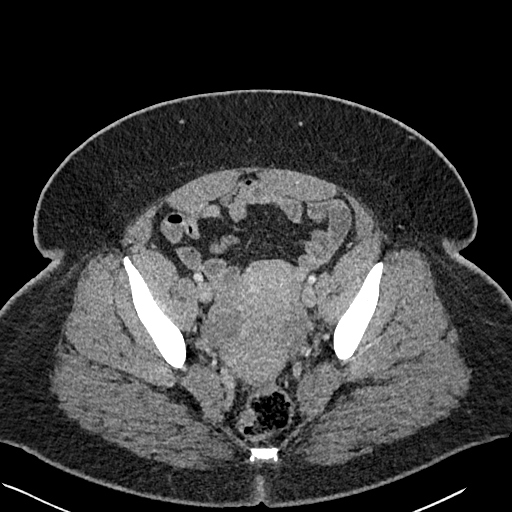
[im 231/640  soft-tissue]
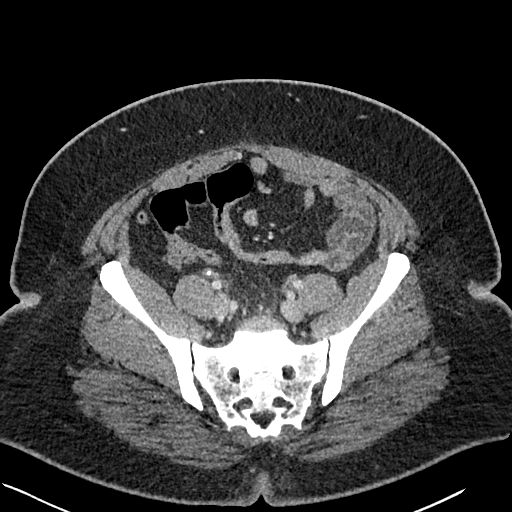
[im 282/640  soft-tissue]
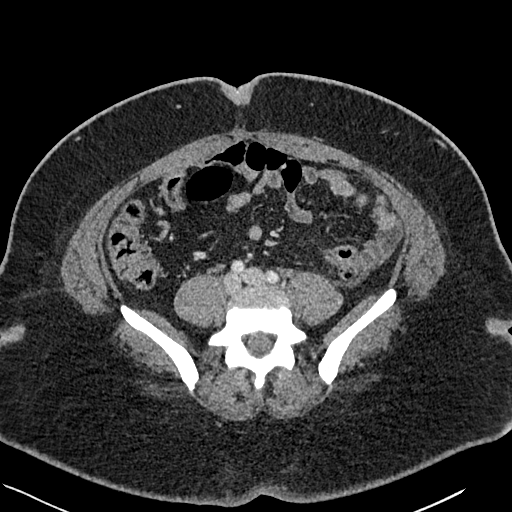
[im 333/640  soft-tissue]
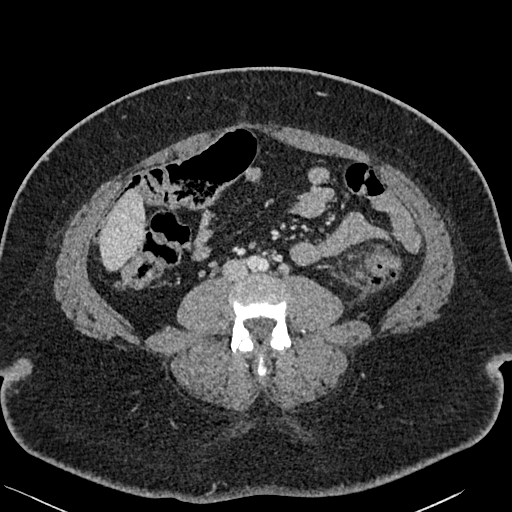
[im 358/640  soft-tissue]
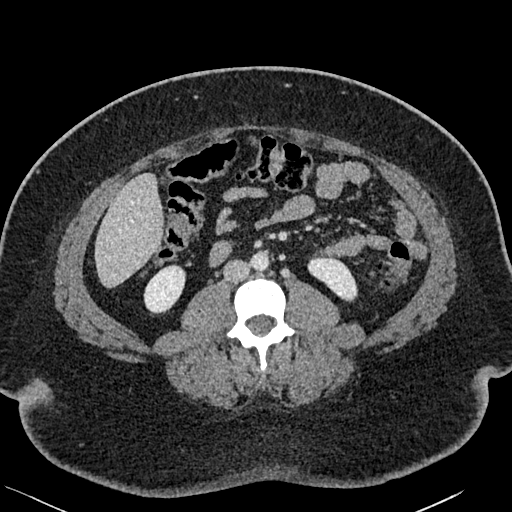
[im 409/640  soft-tissue]
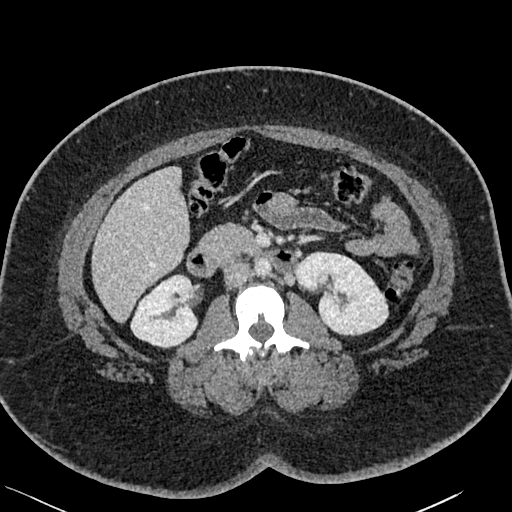
[im 409/640  bone]
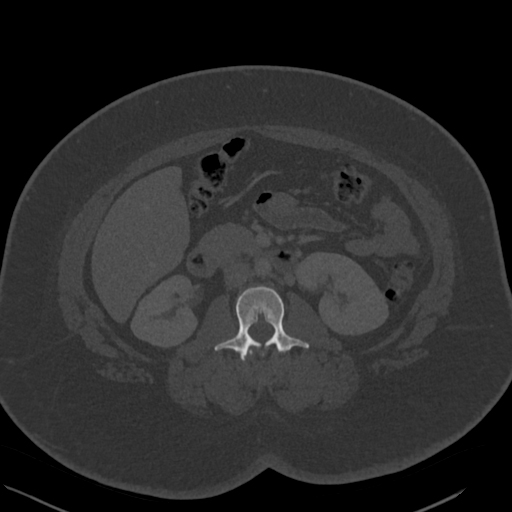
[im 461/640  soft-tissue]
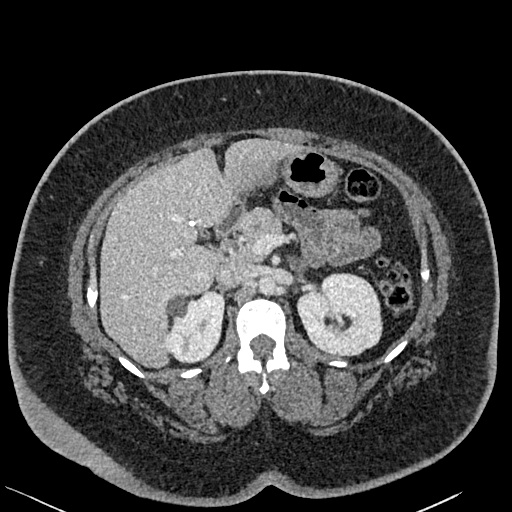
[im 512/640  soft-tissue]
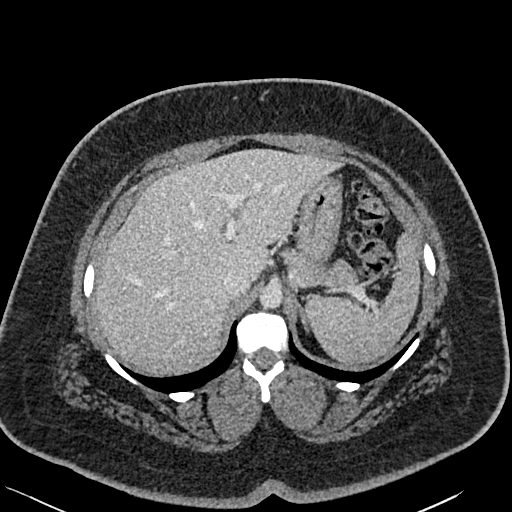
[im 563/640  soft-tissue]
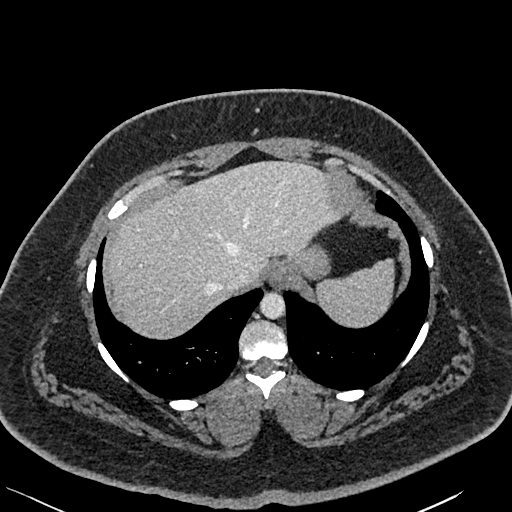
[im 614/640  soft-tissue]
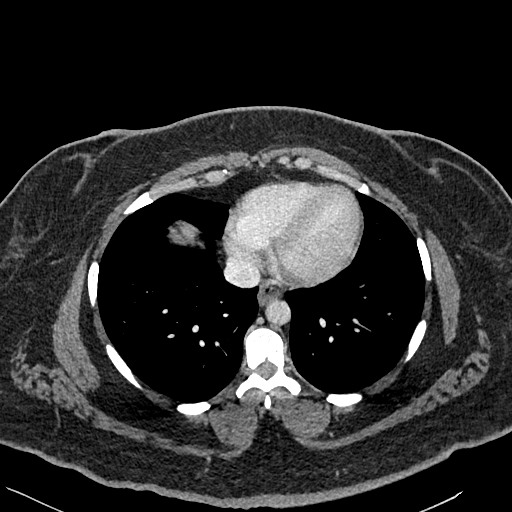

[16 of 46 positions shown; findings below may reference images not displayed]

RADIATION DOSE REDUCTION: This exam was performed according to the
departmental dose-optimization program which includes automated
exposure control, adjustment of the mA and/or kV according to
patient size and/or use of iterative reconstruction technique.

CONTRAST:  100mL OMNIPAQUE IOHEXOL 300 MG/ML  SOLN
FINDINGS: Lower chest: No acute abnormality.

Hepatobiliary: No focal liver abnormality is seen. Status post
cholecystectomy. No biliary dilatation.

Pancreas: Unremarkable. No pancreatic ductal dilatation or
surrounding inflammatory changes.

Spleen: Normal in size without focal abnormality.

Adrenals/Urinary Tract: There is a 15 mm cyst in the right kidney.
Otherwise, the kidneys, adrenal glands, and bladder are within
normal limits.

Stomach/Bowel: There is distal descending colon diverticulosis with
wall thickening and surrounding inflammation compatible with acute
diverticulitis. There is no evidence for perforation or abscess.
There is additional diffuse colonic diverticulosis. There is no
bowel obstruction. The appendix is within normal limits. Stomach and
small bowel are within normal limits.

Vascular/Lymphatic: No significant vascular findings are present. No
enlarged abdominal or pelvic lymph nodes.

Reproductive: There is an IUD in the uterus. Adnexa are
unremarkable.

Other: No abdominal wall hernia or abnormality. No abdominopelvic
ascites.

Musculoskeletal: No acute or significant osseous findings.
IMPRESSION: 1. Acute uncomplicated descending colon diverticulitis.

## 2021-09-13 MED ORDER — FENTANYL CITRATE PF 50 MCG/ML IJ SOSY
75.0000 ug | PREFILLED_SYRINGE | Freq: Once | INTRAMUSCULAR | Status: AC
  Administered 2021-09-13: 75 ug via INTRAVENOUS
  Filled 2021-09-13: qty 2

## 2021-09-13 MED ORDER — SODIUM CHLORIDE 0.9 % IV SOLN: 1000.0000 mL | INTRAVENOUS | Status: DC

## 2021-09-13 MED ORDER — SODIUM CHLORIDE 0.9 % IV BOLUS (SEPSIS)
1000.0000 mL | Freq: Once | INTRAVENOUS | Status: AC
  Administered 2021-09-13: 1000 mL via INTRAVENOUS

## 2021-09-13 MED ORDER — IOHEXOL 300 MG/ML  SOLN
100.0000 mL | Freq: Once | INTRAMUSCULAR | Status: AC | PRN
  Administered 2021-09-13: 100 mL via INTRAVENOUS

## 2021-09-13 MED ORDER — ONDANSETRON HCL 4 MG/2ML IJ SOLN
4.0000 mg | Freq: Once | INTRAMUSCULAR | Status: AC
Start: 2021-09-13 — End: 2021-09-13
  Administered 2021-09-13: 4 mg via INTRAVENOUS
  Filled 2021-09-13: qty 2

## 2021-09-13 NOTE — ED Provider Notes (Signed)
?[facility] EMER[facility] DEPT ?Provider Note ? ? ?CSN: [id number] ?Arrival date & time: 09/13/21  1911 ? ?  ? ?History ? ?Chief Complaint  ?Patient presents with  ? Abdominal Pain  ? ? ?Robin Davis is a 41 y.o. female. ? ? ?Abdominal Pain ?Associated symptoms: no fever   ? ?Patient presents to the ED for evaluation of abdominal pain.  Patient states she started having symptoms this morning.  Pain is primarily more in her lower abdomen and radiates to her pelvic area and flank area.  She is having some pain in her upper abdomen as well she has had no nausea vomiting diarrhea.  No constipation.  Patient states it almost feels like severe pelvic cramping.  She does have history of uterine fibroids and wonders if this could be going on.She denies any vaginal bleeding.  No vaginal discharge ? ?Home Medications ?Prior to Admission medications   ?Medication Sig Start Date End Date Taking? Authorizing Provider  ?amoxicillin-clavulanate (AUGMENTIN) 875-125 MG tablet Take 1 tablet by mouth 2 (two) times daily. 09/14/21  Yes Dorie Rank, MD  ?ondansetron (ZOFRAN-ODT) 8 MG disintegrating tablet Take 1 tablet (8 mg total) by mouth every 8 (eight) hours as needed for nausea or vomiting. 09/14/21  Yes Dorie Rank, MD  ?oxyCODONE-acetaminophen (PERCOCET/ROXICET) 5-325 MG tablet Take 1 tablet by mouth every 6 (six) hours as needed for severe pain. 09/14/21  Yes Dorie Rank, MD  ?albuterol (PROVENTIL HFA;VENTOLIN HFA) 108 (90 BASE) MCG/ACT inhaler Inhale 1-2 puffs into the lungs every 6 (six) hours as needed for wheezing or shortness of breath. 04/15/14   Charlann Lange, PA-C  ?amLODipine (NORVASC) 10 MG tablet Take 10 mg by mouth daily. 12/28/19   [provider]  ?budesonide (PULMICORT) 180 MCG/ACT inhaler Inhale 1 puff into the lungs in the morning and at bedtime. 01/22/20   Corena Herter, PA-C  ?cetirizine (ZYRTEC ALLERGY) 10 MG tablet Take 1 tablet (10 mg total) by mouth daily. 01/16/20   Volney American,  PA-C  ?fluticasone (FLONASE) 50 MCG/ACT nasal spray Place 1 spray into both nostrils 2 (two) times daily. ?Patient not taking: Reported on 01/24/2020 01/16/20   Volney American, PA-C  ?fluvoxaMINE (LUVOX) 50 MG tablet Take 50 mg by mouth daily. 10/15/15   [provider]  ?guaiFENesin-dextromethorphan (ROBITUSSIN DM) 100-10 MG/5ML syrup Take 5 mLs by mouth every 4 (four) hours as needed for cough. 01/29/20   Allie Bossier, MD  ?insulin aspart (NOVOLOG FLEXPEN) 100 UNIT/ML FlexPen Inject 16 Units into the skin 3 (three) times daily with meals. 01/29/20   Allie Bossier, MD  ?insulin glargine (LANTUS) 100 unit/mL SOPN Inject 20 Units into the skin daily. 01/29/20   Allie Bossier, MD  ?insulin starter kit- pen needles MISC Use as directed 01/29/20   Allie Bossier, MD  ?levonorgestrel [facility]) 20 MCG/24HR IUD 1 each by Intrauterine route once. Implanted October 2014    [provider]  ?losartan (COZAAR) 100 MG tablet Take 1 tablet (100 mg total) by mouth daily. 01/29/20   Allie Bossier, MD  ?meloxicam (MOBIC) 7.5 MG tablet Take 7.5 mg by mouth daily.    [provider]  ?metFORMIN (GLUCOPHAGE) 500 MG tablet Take 1 tablet (500 mg total) by mouth 2 (two) times daily with a meal. 01/29/20   Allie Bossier, MD  ?metoprolol tartrate (LOPRESSOR) 25 MG tablet Take 0.5 tablets (12.5 mg total) by mouth 2 (two) times daily. 01/29/20   Allie Bossier, MD  ?  phentermine (ADIPEX-P) 37.5 MG tablet Take 37.5 mg by mouth daily. 03/13/14   [provider]  ?predniSONE (DELTASONE) 10 MG tablet Take 1 tablet (10 mg total) by mouth daily. 01/29/20   Allie Bossier, MD  ?   ? ?Allergies    ?Lisinopril, Nitrofurantoin macrocrystal, Tramadol, Flagyl [metronidazole hcl], Hydrocodone-acetaminophen, and Vicodin [hydrocodone-acetaminophen]   ? ?Review of Systems   ?Review of Systems  ?Constitutional:  Negative for fever.  ?Gastrointestinal:  Positive for abdominal pain.  ? ?Physical Exam ?Updated Vital  Signs ?BP 126/71   Pulse 94   Temp 98.8 ?F (37.1 ?C)   Resp 18   Ht 1.575 m (5' 2")   Wt 99.8 kg   SpO2 98%   BMI 40.24 kg/m?  ?Physical Exam ?Vitals and nursing note reviewed.  ?Constitutional:   ?   Appearance: She is well-developed. She is not diaphoretic.  ?HENT:  ?   Head: Normocephalic and atraumatic.  ?   Right Ear: External ear normal.  ?   Left Ear: External ear normal.  ?Eyes:  ?   General: No scleral icterus.    ?   Right eye: No discharge.     ?   Left eye: No discharge.  ?   Conjunctiva/sclera: Conjunctivae normal.  ?Neck:  ?   Trachea: No tracheal deviation.  ?Cardiovascular:  ?   Rate and Rhythm: Normal rate and regular rhythm.  ?Pulmonary:  ?   Effort: Pulmonary effort is normal. No respiratory distress.  ?   Breath sounds: Normal breath sounds. No stridor. No wheezing or rales.  ?Abdominal:  ?   General: Bowel sounds are normal. There is no distension.  ?   Palpations: Abdomen is soft.  ?   Tenderness: There is generalized abdominal tenderness. There is no guarding or rebound.  ?Musculoskeletal:     ?   General: No tenderness or deformity.  ?   Cervical back: Neck supple.  ?Skin: ?   General: Skin is warm and dry.  ?   Findings: No rash.  ?Neurological:  ?   General: No focal deficit present.  ?   Mental Status: She is alert.  ?   Cranial Nerves: No cranial nerve deficit (no facial droop, extraocular movements intact, no slurred speech).  ?   Sensory: No sensory deficit.  ?   Motor: No abnormal muscle tone or seizure activity.  ?   Coordination: Coordination normal.  ?Psychiatric:     ?   Mood and Affect: Mood normal.  ? ? ?ED Results / Procedures / Treatments   ?Labs ?(all labs ordered are listed, but only abnormal results are displayed) ?Labs Reviewed  ?WET PREP, GENITAL - Abnormal; Notable for the following components:  ?    Result Value  ? Yeast Wet Prep HPF POC NEGATIVE (*)   ? Trich, Wet Prep NEGATIVE (*)   ? Clue Cells Wet Prep HPF POC PRESENT (*)   ? All other components within normal  limits  ?LIPASE, BLOOD - Abnormal; Notable for the following components:  ? Lipase 73 (*)   ? All other components within normal limits  ?COMPREHENSIVE METABOLIC PANEL - Abnormal; Notable for the following components:  ? Potassium 3.0 (*)   ? Glucose, Bld 124 (*)   ? All other components within normal limits  ?CBC - Abnormal; Notable for the following components:  ? WBC 11.8 (*)   ? All other components within normal limits  ?URINALYSIS, ROUTINE W REFLEX MICROSCOPIC  ?HCG, SERUM, QUALITATIVE  ?  GC/CHLAMYDIA PROBE AMP ([place]) NOT AT [facility]  ? ? ?EKG ?None ? ?Radiology ?CT ABDOMEN PELVIS W CONTRAST ? ?Result Date: 09/13/2021 ?CLINICAL DATA:  Acute abdominal pain. EXAM: CT ABDOMEN AND PELVIS WITH CONTRAST TECHNIQUE: Multidetector CT imaging of the abdomen and pelvis was performed using the standard protocol following bolus administration of intravenous contrast. RADIATION DOSE REDUCTION: This exam was performed according to the departmental dose-optimization program which includes automated exposure control, adjustment of the mA and/or kV according to patient size and/or use of iterative reconstruction technique. CONTRAST:  1103m OMNIPAQUE IOHEXOL 300 MG/ML  SOLN COMPARISON:  CT abdomen and pelvis 03/04/2018. FINDINGS: Lower chest: No acute abnormality. Hepatobiliary: No focal liver abnormality is seen. Status post cholecystectomy. No biliary dilatation. Pancreas: Unremarkable. No pancreatic ductal dilatation or surrounding inflammatory changes. Spleen: Normal in size without focal abnormality. Adrenals/Urinary Tract: There is a 15 mm cyst in the right kidney. Otherwise, the kidneys, adrenal glands, and bladder are within normal limits. Stomach/Bowel: There is distal descending colon diverticulosis with wall thickening and surrounding inflammation compatible with acute diverticulitis. There is no evidence for perforation or abscess. There is additional diffuse colonic diverticulosis. There is no bowel obstruction. The  appendix is within normal limits. Stomach and small bowel are within normal limits. Vascular/Lymphatic: No significant vascular findings are present. No enlarged abdominal or pelvic lymph nodes. Reproductive: T

## 2021-09-13 NOTE — ED Triage Notes (Signed)
Patient here POV from Home. ? ?Endorses Pain to entire Lower ABD radiating to Pelvic Area and Right Leg. Present since this AM and been Constant since it began. ? ?No N/V/D. No Constipation.  ? ?NAD Noted during Triage. A&Ox4. GCS 15. Ambulatory. ?

## 2021-09-14 LAB — WET PREP, GENITAL
Sperm: NEGATIVE
Trich, Wet Prep: NEGATIVE — AB
WBC, Wet Prep HPF POC: <10 (ref <10)
Yeast Wet Prep HPF POC: NEGATIVE — AB

## 2021-09-14 MED ORDER — AMOXICILLIN-POT CLAVULANATE 875-125 MG PO TABS: 1.0000 | ORAL_TABLET | Freq: Two times a day (BID) | ORAL | 0 refills | Status: DC

## 2021-09-14 MED ORDER — ONDANSETRON 8 MG PO TBDP: 8.0000 mg | ORAL_TABLET | Freq: Three times a day (TID) | ORAL | 0 refills | Status: DC | PRN

## 2021-09-14 MED ORDER — OXYCODONE-ACETAMINOPHEN 5-325 MG PO TABS: 1.0000 | ORAL_TABLET | Freq: Four times a day (QID) | ORAL | 0 refills | Status: DC | PRN

## 2021-09-14 MED ORDER — CIPROFLOXACIN HCL 500 MG PO TABS
500.0000 mg | ORAL_TABLET | Freq: Once | ORAL | Status: AC
  Administered 2021-09-14: 500 mg via ORAL
  Filled 2021-09-14: qty 1

## 2021-09-14 NOTE — Discharge Instructions (Signed)
Take the medications as prescribed.  Follow-up with your doctor to be rechecked.  Return for fever vomiting worsening symptoms ?

## 2021-09-16 LAB — GC/CHLAMYDIA PROBE AMP (~~LOC~~) NOT AT ARMC
Chlamydia: NEGATIVE
Comment: NEGATIVE
Comment: NORMAL
Neisseria Gonorrhea: NEGATIVE

## 2021-10-15 ENCOUNTER — Ambulatory Visit (INDEPENDENT_AMBULATORY_CARE_PROVIDER_SITE_OTHER): Payer: Medicaid Other | Admitting: Family Medicine

## 2021-10-15 ENCOUNTER — Ambulatory Visit
Admission: RE | Admit: 2021-10-15 | Discharge: 2021-10-15 | Disposition: A | Discharge status: Home / Self Care | Payer: Medicaid Other | Source: Ambulatory Visit | Attending: Family Medicine | Admitting: Family Medicine

## 2021-10-15 ENCOUNTER — Encounter: Payer: Self-pay | Admitting: Family Medicine

## 2021-10-15 ENCOUNTER — Other Ambulatory Visit (HOSPITAL_COMMUNITY)
Admission: RE | Admit: 2021-10-15 | Discharge: 2021-10-15 | Disposition: A | Discharge status: Home / Self Care | Payer: Medicaid Other | Source: Ambulatory Visit | Attending: Family Medicine | Admitting: Family Medicine

## 2021-10-15 ENCOUNTER — Other Ambulatory Visit: Payer: Self-pay | Admitting: Family Medicine

## 2021-10-15 VITALS — BP 151/99 | HR 97 | Ht 62.75 in | Wt 233.4 lb

## 2021-10-15 DIAGNOSIS — I10 Essential (primary) hypertension: Secondary | ICD-10-CM | POA: Diagnosis not present

## 2021-10-15 DIAGNOSIS — Z975 Presence of (intrauterine) contraceptive device: Secondary | ICD-10-CM | POA: Insufficient documentation

## 2021-10-15 DIAGNOSIS — Z1211 Encounter for screening for malignant neoplasm of colon: Secondary | ICD-10-CM | POA: Diagnosis not present

## 2021-10-15 DIAGNOSIS — Z01419 Encounter for gynecological examination (general) (routine) without abnormal findings: Secondary | ICD-10-CM | POA: Diagnosis not present

## 2021-10-15 DIAGNOSIS — Z1231 Encounter for screening mammogram for malignant neoplasm of breast: Secondary | ICD-10-CM | POA: Diagnosis not present

## 2021-10-15 DIAGNOSIS — Z124 Encounter for screening for malignant neoplasm of cervix: Secondary | ICD-10-CM

## 2021-10-15 DIAGNOSIS — Z113 Encounter for screening for infections with a predominantly sexual mode of transmission: Secondary | ICD-10-CM | POA: Insufficient documentation

## 2021-10-15 DIAGNOSIS — K5792 Diverticulitis of intestine, part unspecified, without perforation or abscess without bleeding: Secondary | ICD-10-CM | POA: Diagnosis not present

## 2021-10-15 IMAGING — MG MM DIGITAL SCREENING BILAT W/ TOMO AND CAD
6 of 10 series · 6 of 30 positions shown · non-contrast
Comparison: None available.

CLINICAL DATA: Screening.

EXAM:
DIGITAL SCREENING BILATERAL MAMMOGRAM WITH TOMOSYNTHESIS AND CAD
TECHNIQUE: Bilateral screening digital craniocaudal and mediolateral oblique
mammograms were obtained. Bilateral screening digital breast
tomosynthesis was performed. The images were evaluated with
computer-aided detection.

[L CC synth-2D]
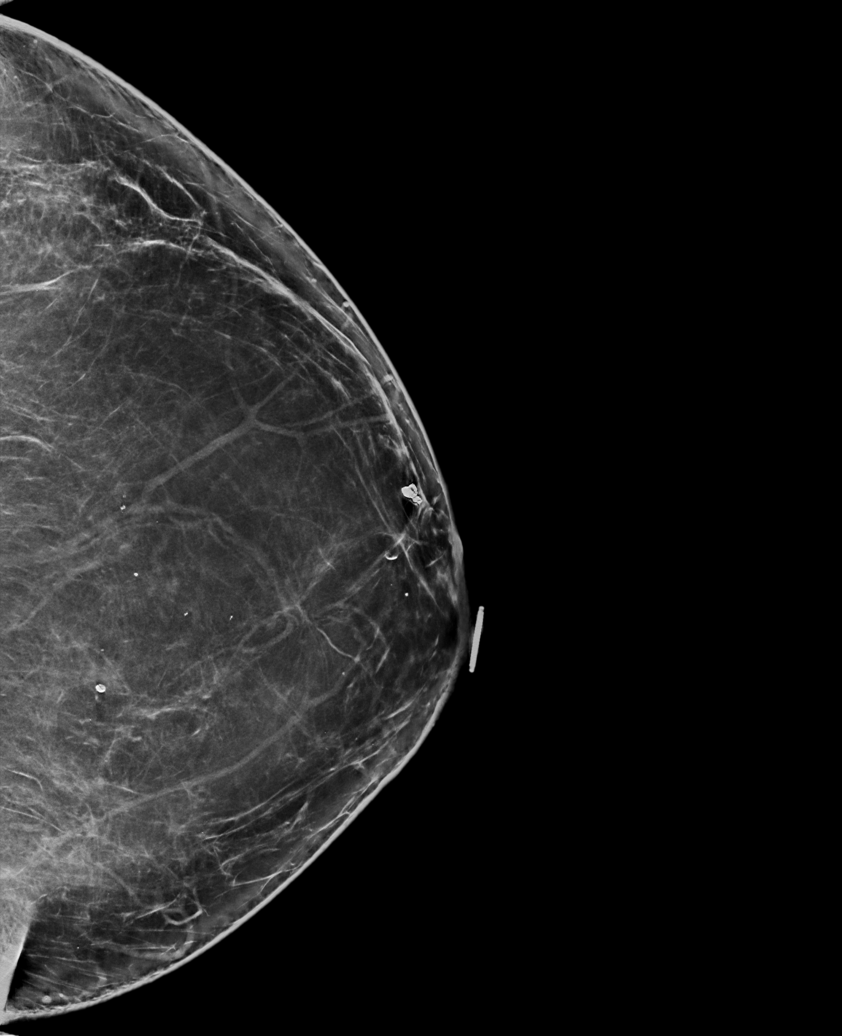

[L MLO synth-2D]
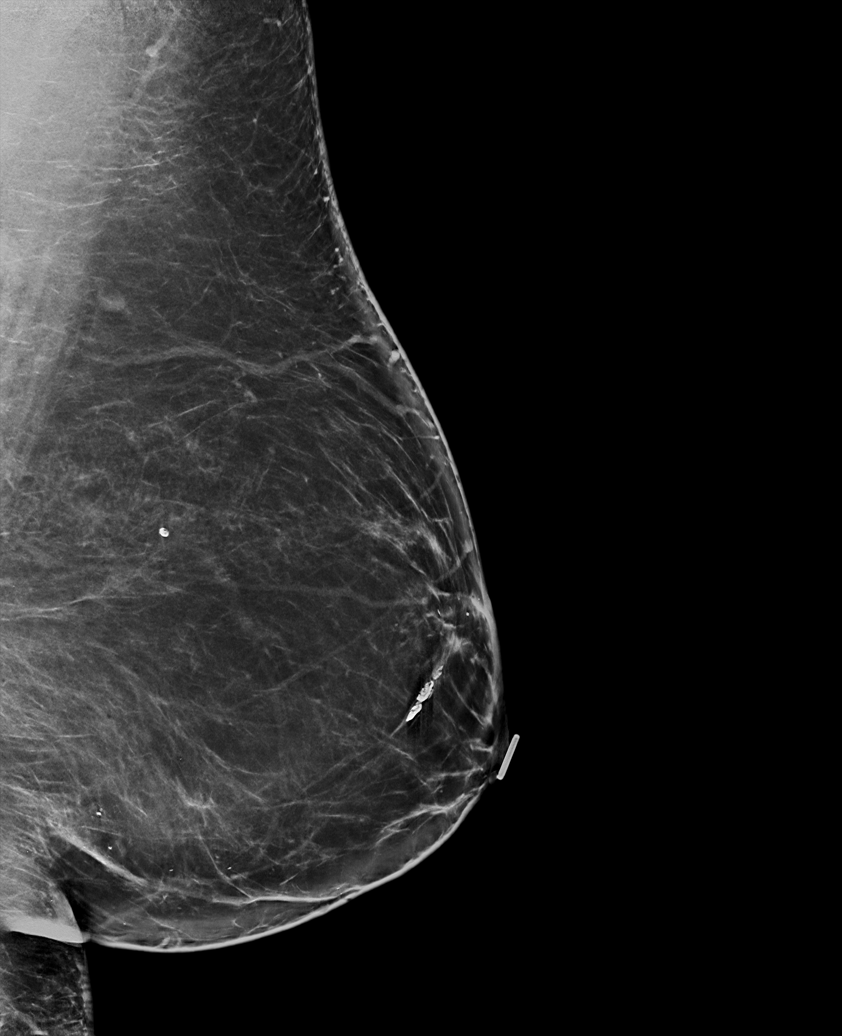

[R MLO synth-2D]
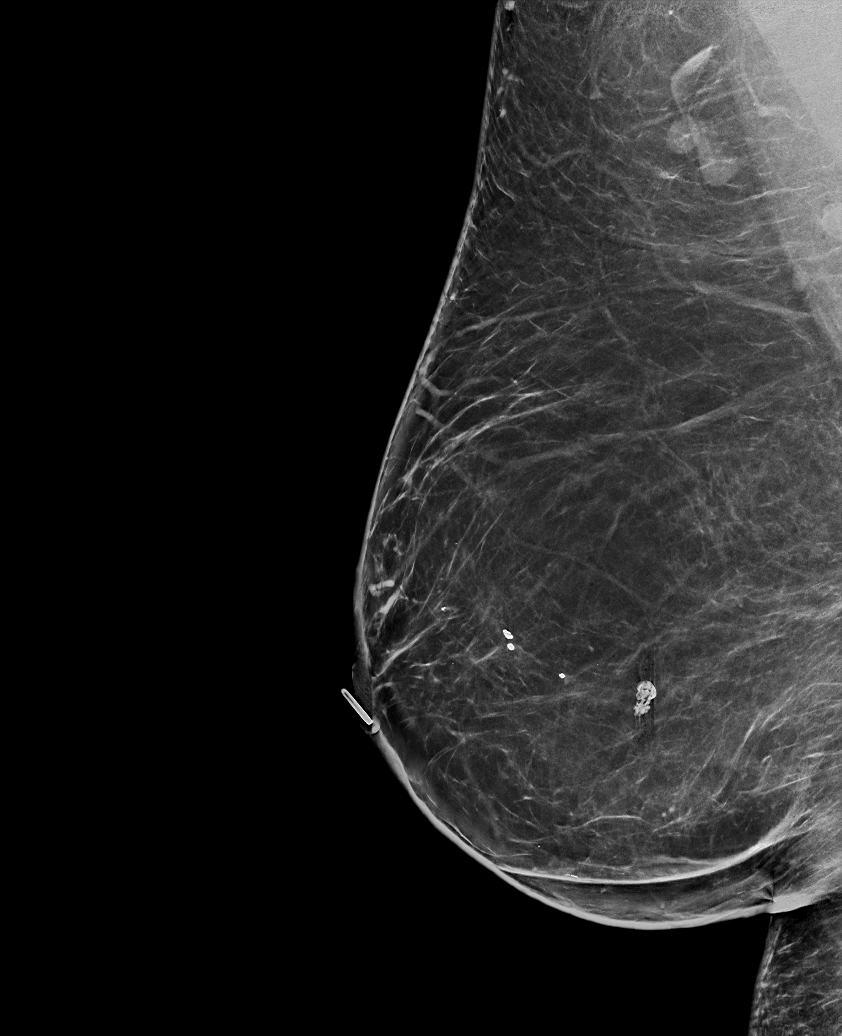

[R CC synth-2D (1 of 2)]
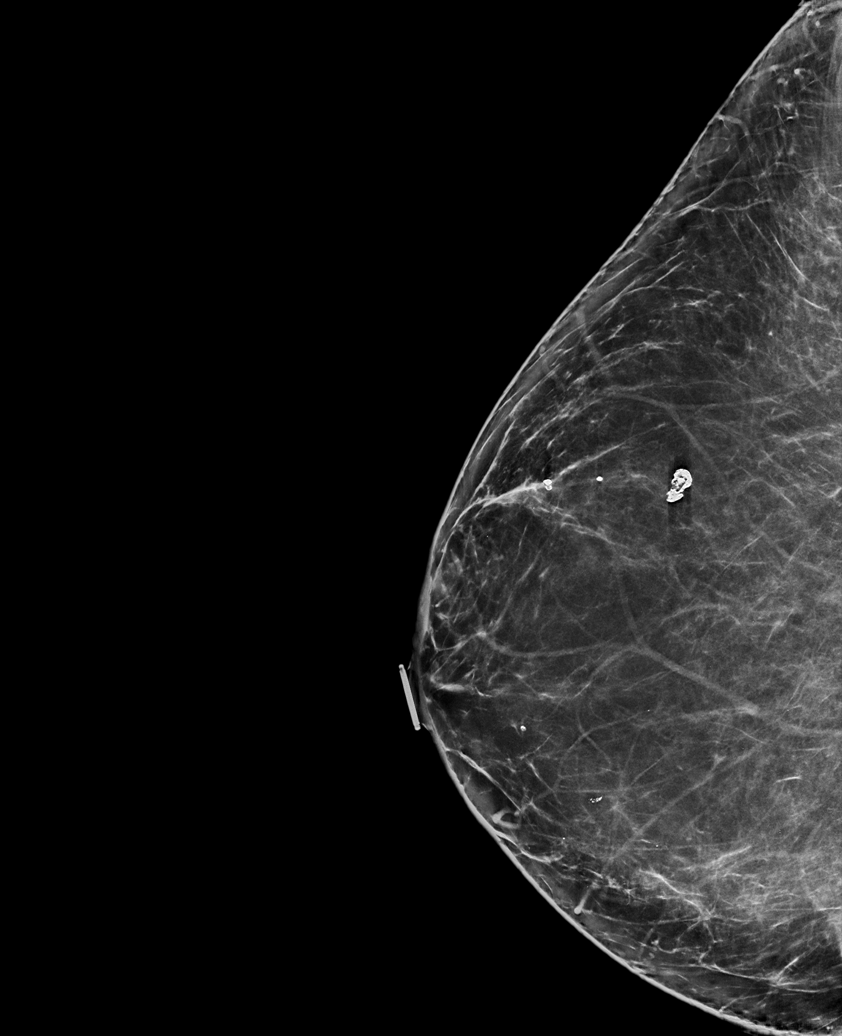

[R CC synth-2D (2 of 2)]
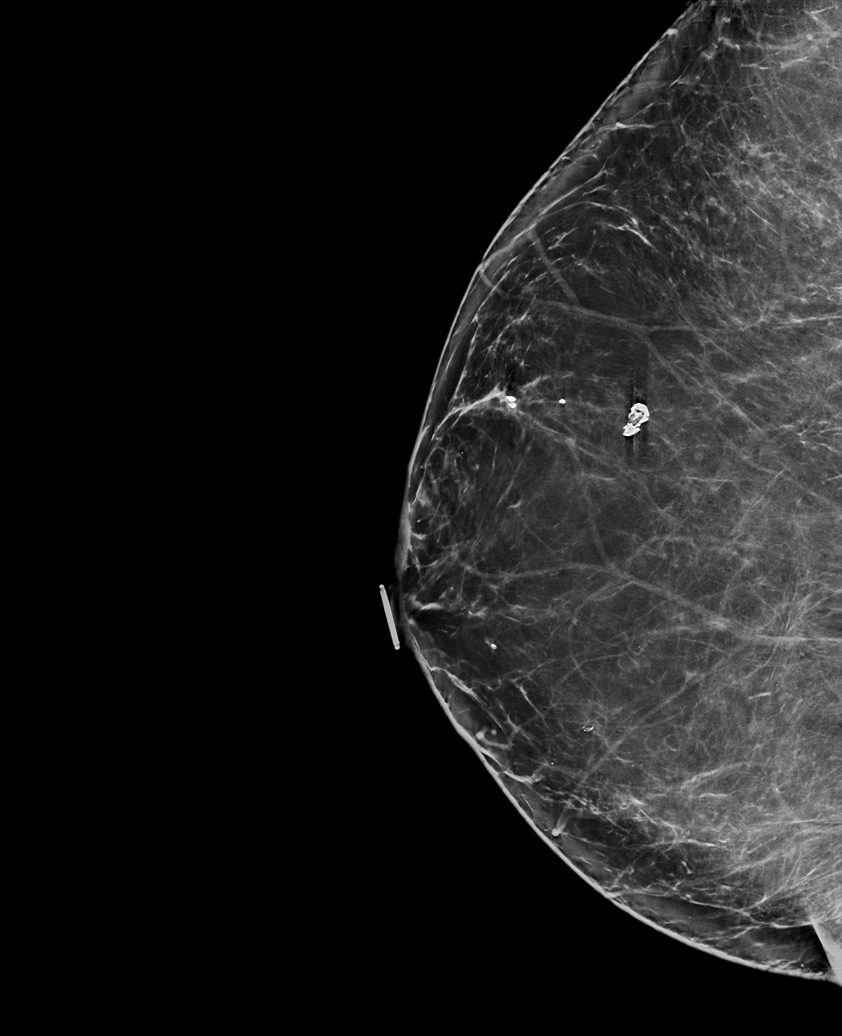

[R CC tomo · tomo slice 43/85.0]
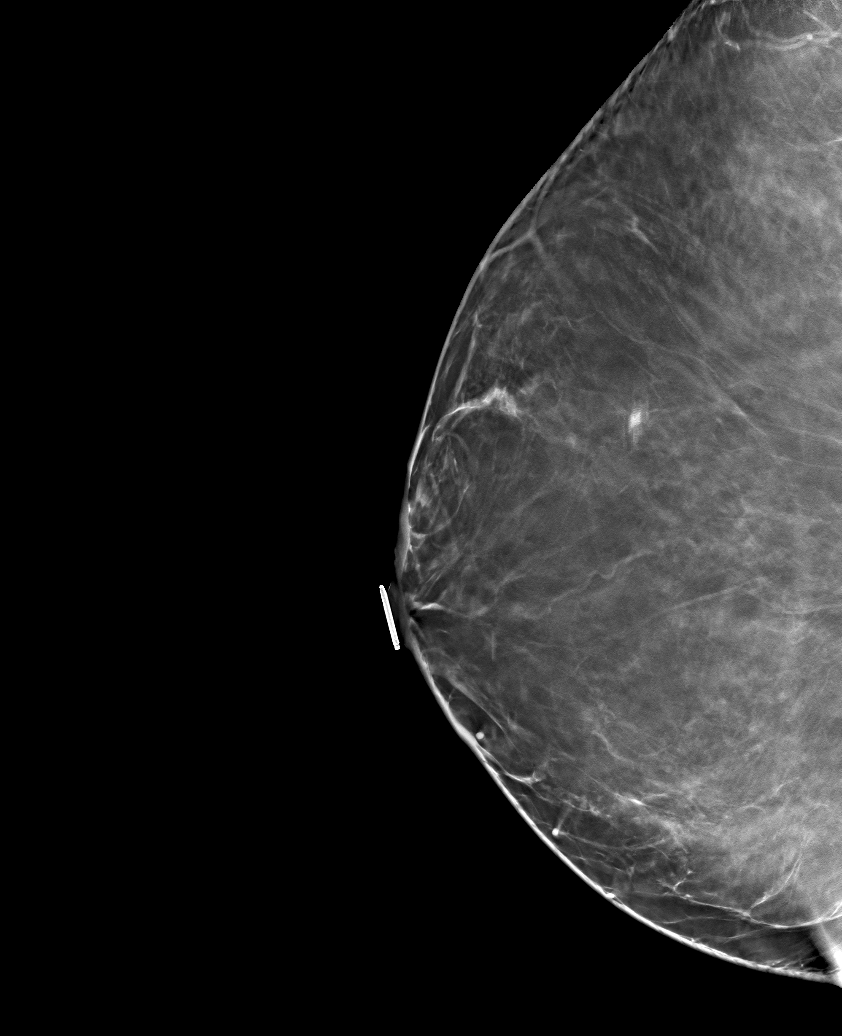

[6 of 30 positions shown; findings below may reference images not displayed]

ACR Breast Density Category b: There are scattered areas of
fibroglandular density.
FINDINGS: There are no findings suspicious for malignancy.
IMPRESSION: No mammographic evidence of malignancy. A result letter of this
screening mammogram will be mailed directly to the patient.

RECOMMENDATION:
Screening mammogram in one year. (Code:GB-Z-FIU)

BI-RADS CATEGORY  1: Negative.

## 2021-10-15 MED ORDER — POLYETHYLENE GLYCOL 3350 17 GM/SCOOP PO POWD: 17.0000 g | Freq: Every day | ORAL | 1 refills | Status: DC | PRN

## 2021-10-15 NOTE — Assessment & Plan Note (Signed)
Doing OK but not yet started on laxative, order placed for miralax. Also advised that she needs colonoscopy, referral placed.

## 2021-10-15 NOTE — Progress Notes (Signed)
GYNECOLOGY OFFICE VISIT NOTE  History:   Robin Davis is a 41 y.o. [id number] here today for multiple concerns.  Thinks her abdominal/pelvic pain was actually from diverticulitis Not worried about it as much anymore  Needs pap smear and mammo   Health Maintenance Due  Topic Date Due   COVID-19 Vaccine (1) Never done   Hepatitis C Screening  Never done   TETANUS/TDAP  Never done   PAP SMEAR-Modifier  Never done    Past Medical History:  Diagnosis Date   Anxiety    COVID-19    Foot pain    Hypertension    Obesity     Past Surgical History:  Procedure Laterality Date   BREAST REDUCTION SURGERY     CHOLECYSTECTOMY     REDUCTION MAMMAPLASTY  2006    The following portions of the patient's history were reviewed and updated as appropriate: allergies, current medications, past family history, past medical history, past social history, past surgical history and problem list.   Health Maintenance:   Last pap: No results found for: DIAGPAP, HPV, HPVHIGH Collected today  Last mammogram:  Ordered today     Review of Systems:  Pertinent items noted in HPI and remainder of comprehensive ROS otherwise negative.  Physical Exam:  BP (!) 151/99   Pulse 97   Ht 5' 2.75" (1.594 m)   Wt 233 lb 6.4 oz (105.9 kg)   LMP  (LMP Unknown)   BMI 41.68 kg/m  CONSTITUTIONAL: Well-developed, well-nourished female in no acute distress.  HEENT:  Normocephalic, atraumatic. External right and left ear normal. No scleral icterus.  NECK: Normal range of motion, supple, no masses noted on observation SKIN: No rash noted. Not diaphoretic. No erythema. No pallor. MUSCULOSKELETAL: Normal range of motion. No edema noted. NEUROLOGIC: Alert and oriented to person, place, and time. Normal muscle tone coordination.  PSYCHIATRIC: Normal mood and affect. Normal behavior. Normal judgment and thought content. RESPIRATORY: Effort normal, no problems with respiration noted ABDOMEN: No masses noted.  No other overt distention noted.   PELVIC: Normal appearing external genitalia; normal appearing vaginal mucosa and cervix.  No abnormal discharge noted. IUD strings NOT visualized  Labs and Imaging No results found for this or any previous visit (from the past 168 hour(s)). No results found.    Assessment and Plan:   Problem List Items Addressed This Visit       Cardiovascular and Mediastinum   Essential hypertension    Not well controlled today, encouraged to continue taking previously prescribed medications and also showed her how to establish PCP on [facility] primary care website.          Other   Diverticulitis    Doing OK but not yet started on laxative, order placed for miralax. Also advised that she needs colonoscopy, referral placed.        Relevant Medications   polyethylene glycol powder (GLYCOLAX/MIRALAX) 17 GM/SCOOP powder   Other Relevant Orders   Ambulatory referral to Gastroenterology   Encounter for gynecological examination without abnormal finding - Primary    Cancer screening: - Pap: collected today - Mammogram: ordered today - Colonoscopy: given hx of diverticulitis this is indicated, referred to GI  Contraception: IUD in place, well seated on CT scan one month prior  Mood: [place] Office Visit from 10/15/2021 in Center for [employer] at [facility]  PHQ-9 Total Score 8      Vaccinations: - Flu: n/a - HPV: n/a - COVID:  previous infection - PPSV23:  - Other:   Metabolic: - DM: offered, reports was taken off insulin and metformin last year, referred to new PCP - Lipids: offered, will do with new pCP  ID: - STI: offered, declines serologies but ok for swabs      IUD (intrauterine device) in place    Strings not seen on exam, however IUD was well seated at the fundus a month ago on CT scan. Patient happy with method.        Other Visit Diagnoses     Encounter for screening mammogram for malignant neoplasm of  breast       Screening for malignant neoplasm of cervix       Relevant Orders   Cytology - PAP( [place])   Special screening for malignant neoplasms, colon       Screening examination for sexually transmitted disease       Relevant Orders   Cytology - PAP( [place])       Routine preventative health maintenance measures emphasized. Please refer to After Visit Summary for other counseling recommendations.   Return in 1 year (on 10/16/2022) for Annual Wellness Visit.    Total face-to-face time with patient: 30 minutes.  Over 50% of encounter was spent on counseling and coordination of care.   Clarnce Flock, MD/MPH Attending Family Medicine Physician, [facility] for [facility], [place]

## 2021-10-15 NOTE — Assessment & Plan Note (Signed)
Strings not seen on exam, however IUD was well seated at the fundus a month ago on CT scan. Patient happy with method.

## 2021-10-15 NOTE — Patient Instructions (Signed)

## 2021-10-15 NOTE — Assessment & Plan Note (Signed)
Not well controlled today, encouraged to continue taking previously prescribed medications and also showed her how to establish PCP on [facility] primary care website.

## 2021-10-15 NOTE — Assessment & Plan Note (Signed)
Cancer screening: - Pap: collected today - Mammogram: ordered today - Colonoscopy: given hx of diverticulitis this is indicated, referred to GI  Contraception: IUD in place, well seated on CT scan one month prior  Mood: [place] Office Visit from 10/15/2021 in Center for [employer] at [facility]  PHQ-9 Total Score 8       Vaccinations: - Flu: n/a - HPV: n/a - COVID: previous infection - PPSV23:  - Other:   Metabolic: - DM: offered, reports was taken off insulin and metformin last year, referred to new PCP - Lipids: offered, will do with new pCP  ID: - STI: offered, declines serologies but ok for swabs

## 2021-10-17 LAB — CYTOLOGY - PAP
Adequacy: ABSENT
Chlamydia: NEGATIVE
Comment: NEGATIVE
Comment: NEGATIVE
Comment: NEGATIVE
Comment: NORMAL
Diagnosis: NEGATIVE
High risk HPV: NEGATIVE
Neisseria Gonorrhea: NEGATIVE
Trichomonas: NEGATIVE

## 2021-11-18 ENCOUNTER — Encounter (HOSPITAL_COMMUNITY): Payer: Self-pay | Admitting: Emergency Medicine

## 2021-11-18 ENCOUNTER — Other Ambulatory Visit: Payer: Self-pay

## 2021-11-18 ENCOUNTER — Emergency Department (HOSPITAL_COMMUNITY)
Admission: EM | Admit: 2021-11-18 | Discharge: 2021-11-18 | Disposition: A | Discharge status: Home / Self Care | Payer: Medicaid Other | Attending: Emergency Medicine | Admitting: Emergency Medicine

## 2021-11-18 ENCOUNTER — Emergency Department (HOSPITAL_COMMUNITY): Payer: Medicaid Other

## 2021-11-18 DIAGNOSIS — M25551 Pain in right hip: Secondary | ICD-10-CM | POA: Diagnosis not present

## 2021-11-18 DIAGNOSIS — M549 Dorsalgia, unspecified: Secondary | ICD-10-CM

## 2021-11-18 DIAGNOSIS — Z79899 Other long term (current) drug therapy: Secondary | ICD-10-CM | POA: Diagnosis not present

## 2021-11-18 DIAGNOSIS — I10 Essential (primary) hypertension: Secondary | ICD-10-CM | POA: Insufficient documentation

## 2021-11-18 DIAGNOSIS — M545 Low back pain, unspecified: Secondary | ICD-10-CM | POA: Diagnosis not present

## 2021-11-18 MED ORDER — METHOCARBAMOL 500 MG PO TABS: 500.0000 mg | ORAL_TABLET | Freq: Two times a day (BID) | ORAL | 0 refills | Status: DC

## 2021-11-18 MED ORDER — KETOROLAC TROMETHAMINE 15 MG/ML IJ SOLN
15.0000 mg | Freq: Once | INTRAMUSCULAR | Status: AC
  Administered 2021-11-18: 15 mg via INTRAMUSCULAR
  Filled 2021-11-18: qty 1

## 2021-11-18 MED ORDER — LIDOCAINE 5 % EX PTCH
1.0000 | MEDICATED_PATCH | CUTANEOUS | Status: DC
  Administered 2021-11-18: 1 via TRANSDERMAL
  Filled 2021-11-18: qty 1

## 2021-11-18 MED ORDER — DICLOFENAC SODIUM 50 MG PO TBEC: 50.0000 mg | DELAYED_RELEASE_TABLET | Freq: Two times a day (BID) | ORAL | 0 refills | Status: AC

## 2021-11-18 NOTE — Discharge Instructions (Signed)
Robaxin and diclofenac as prescribed.  Follow-up with your primary care provider for recheck.  Recommend warm compresses to sore muscles for 20 minutes at a time.

## 2021-11-18 NOTE — ED Provider Triage Note (Signed)
Emergency Medicine Provider Triage Evaluation Note  Robin Davis , a 41 y.o. female  was evaluated in triage.  Pt complains of right hip pain x 1 week, worse with walking, located along iliac crest, shoots to greater trochanter and now low back. No falls.   Review of Systems  Positive: Hip and back pain, diarrhea (today only) Negative: Changes in bladder habits  Physical Exam  BP (!) 164/102 (BP Location: Right Arm)   Pulse (!) 108   Temp 99.8 F (37.7 C) (Oral)   Resp 16   SpO2 98%  Gen:   Awake, no distress   Resp:  Normal effort  MSK:   Moves extremities without difficulty  Other:    Medical Decision Making  Medically screening exam initiated at 3:29 PM.  Appropriate orders placed.  Robin Davis was informed that the remainder of the evaluation will be completed by another provider, this initial triage assessment does not replace that evaluation, and the importance of remaining in the ED until their evaluation is complete.     Jeannie Fend, PA-C 11/18/21 1531

## 2021-11-18 NOTE — ED Provider Notes (Signed)
[facility]-EMERGENCY DEPT Provider Note   CSN: [id number] Arrival date & time: 11/18/21  1510     History  Chief Complaint  Patient presents with   Hip Pain    Robin Davis is a 41 y.o. female.  Pt complains of right hip pain x 1 week, worse with walking, located along iliac crest, shoots to greater trochanter and now low back. No falls.        Home Medications Prior to Admission medications   Medication Sig Start Date End Date Taking? Authorizing Provider  diclofenac (VOLTAREN) 50 MG EC tablet Take 1 tablet (50 mg total) by mouth 2 (two) times daily for 10 days. 11/18/21 11/28/21 Yes Jeannie Fend, PA-C  methocarbamol (ROBAXIN) 500 MG tablet Take 1 tablet (500 mg total) by mouth 2 (two) times daily. 11/18/21  Yes Jeannie Fend, PA-C  amLODipine (NORVASC) 10 MG tablet Take 10 mg by mouth daily. 12/28/19   [provider]  amoxicillin-clavulanate (AUGMENTIN) 875-125 MG tablet Take 1 tablet by mouth 2 (two) times daily. 09/14/21   Linwood Dibbles, MD  cetirizine (ZYRTEC ALLERGY) 10 MG tablet Take 1 tablet (10 mg total) by mouth daily. 01/16/20   Particia Nearing, PA-C  levonorgestrel (MIRENA) 20 MCG/24HR IUD 1 each by Intrauterine route once. Implanted October 2014    [provider]  losartan (COZAAR) 100 MG tablet Take 1 tablet (100 mg total) by mouth daily. 01/29/20   Drema Dallas, MD  ondansetron (ZOFRAN-ODT) 8 MG disintegrating tablet Take 1 tablet (8 mg total) by mouth every 8 (eight) hours as needed for nausea or vomiting. 09/14/21   Linwood Dibbles, MD  oxyCODONE-acetaminophen (PERCOCET/ROXICET) 5-325 MG tablet Take 1 tablet by mouth every 6 (six) hours as needed for severe pain. 09/14/21   Linwood Dibbles, MD  polyethylene glycol powder (GLYCOLAX/MIRALAX) 17 GM/SCOOP powder Take 17 g by mouth daily as needed. 10/15/21   Venora Maples, MD      Allergies    Lisinopril, Nitrofurantoin macrocrystal, Tramadol, Flagyl [metronidazole hcl],  Hydrocodone-acetaminophen, and Vicodin [hydrocodone-acetaminophen]    Review of Systems   Review of Systems Negative except as per HPI Physical Exam Updated Vital Signs BP (!) 148/96 (BP Location: Left Arm)   Pulse (!) 113   Temp 98.5 F (36.9 C) (Oral)   Resp 17   SpO2 99%  Physical Exam Vitals and nursing note reviewed.  Constitutional:      General: She is not in acute distress.    Appearance: She is well-developed. She is obese. She is not diaphoretic.  HENT:     Head: Normocephalic and atraumatic.  Pulmonary:     Effort: Pulmonary effort is normal.  Abdominal:     Palpations: Abdomen is soft.     Tenderness: There is no abdominal tenderness.  Musculoskeletal:        General: Tenderness present. No swelling.     Lumbar back: No tenderness or bony tenderness.       Back:       Legs:  Skin:    General: Skin is warm and dry.     Findings: No erythema or rash.  Neurological:     Mental Status: She is alert and oriented to person, place, and time.     Sensory: No sensory deficit.     Motor: No weakness.     Gait: Gait normal.  Psychiatric:        Behavior: Behavior normal.     ED Results /  Procedures / Treatments   Labs (all labs ordered are listed, but only abnormal results are displayed) Labs Reviewed - No data to display  EKG None  Radiology DG Hip Unilat With Pelvis 2-3 Views Right  Result Date: 11/18/2021 CLINICAL DATA:  back to hip pain EXAM: DG HIP (WITH OR WITHOUT PELVIS) 2-3V RIGHT COMPARISON:  None Available. FINDINGS: There is no evidence of acute fracture. Alignment is normal. There are minimal degenerative changes of the right hip. IMPRESSION: No acute osseous abnormality. Minimal degenerative changes of the right hip. Electronically Signed   By: Caprice Renshaw M.D.   On: 11/18/2021 16:15   DG Lumbar Spine Complete  Result Date: 11/18/2021 CLINICAL DATA:  Back to hip pain EXAM: LUMBAR SPINE - COMPLETE 4+ VIEW COMPARISON:  None Available. FINDINGS:  There are 5 non-rib-bearing lumbar vertebrae. There is no evidence of lumbar spine fracture. Disc heights are preserved. Facets appear unremarkable. There is no significant listhesis. There is an IUD overlying the pelvis. IMPRESSION: Negative lumbar spine radiographs. Electronically Signed   By: Caprice Renshaw M.D.   On: 11/18/2021 16:13    Procedures Procedures    Medications Ordered in ED Medications  lidocaine (LIDODERM) 5 % 1 patch (1 patch Transdermal Patch Applied 11/18/21 1733)  ketorolac (TORADOL) 15 MG/ML injection 15 mg (15 mg Intramuscular Given 11/18/21 1734)    ED Course/ Medical Decision Making/ A&P                           Medical Decision Making Amount and/or Complexity of Data Reviewed Radiology: ordered.  Risk Prescription drug management.   41 year old female with pain around right iliac crest area extending to greater trochanter of right hip area and low back pain without fall or injury as above.  On exam, she is tender to palpation in these areas, no crepitus.  DP pulses intact, sensation intact.  Gait intact.  X-rays of the lumbar spine and right hip as ordered and interpreted by myself are negative for acute bony changes.  Agree with radiologist interpretation.  Plan is to treat patient's pain today with a Lidoderm patch and injection of Toradol.  Discharged with diclofenac and Robaxin with plan to follow-up with PCP for recheck.        Final Clinical Impression(s) / ED Diagnoses Final diagnoses:  Musculoskeletal back pain    Rx / DC Orders ED Discharge Orders          Ordered    methocarbamol (ROBAXIN) 500 MG tablet  2 times daily        11/18/21 1713    diclofenac (VOLTAREN) 50 MG EC tablet  2 times daily        11/18/21 1713              Jeannie Fend, PA-C 11/18/21 1926    Pricilla Loveless, MD 11/18/21 2302

## 2021-11-18 NOTE — ED Triage Notes (Signed)
Pt reports right hip pain that occurred today and has been on and of for a few days now. Pt describes pain as sharp and cramping.

## 2021-11-20 ENCOUNTER — Encounter (HOSPITAL_COMMUNITY): Payer: Self-pay

## 2021-11-20 ENCOUNTER — Ambulatory Visit (HOSPITAL_COMMUNITY)
Admission: EM | Admit: 2021-11-20 | Discharge: 2021-11-20 | Disposition: A | Discharge status: Home / Self Care | Payer: Medicaid Other

## 2021-11-20 DIAGNOSIS — R1084 Generalized abdominal pain: Secondary | ICD-10-CM

## 2021-11-20 DIAGNOSIS — R051 Acute cough: Secondary | ICD-10-CM

## 2021-11-20 DIAGNOSIS — R197 Diarrhea, unspecified: Secondary | ICD-10-CM | POA: Diagnosis not present

## 2021-11-20 MED ORDER — ALBUTEROL SULFATE HFA 108 (90 BASE) MCG/ACT IN AERS
INHALATION_SPRAY | RESPIRATORY_TRACT | Status: AC
  Filled 2021-11-20: qty 6.7

## 2021-11-20 MED ORDER — BENZONATATE 100 MG PO CAPS: 100.0000 mg | ORAL_CAPSULE | Freq: Three times a day (TID) | ORAL | 0 refills | Status: DC

## 2021-11-20 MED ORDER — AEROCHAMBER PLUS FLO-VU LARGE MISC
Status: AC
  Filled 2021-11-20: qty 1

## 2021-11-20 MED ORDER — ALBUTEROL SULFATE HFA 108 (90 BASE) MCG/ACT IN AERS
2.0000 | INHALATION_SPRAY | Freq: Once | RESPIRATORY_TRACT | Status: AC
  Administered 2021-11-20: 2 via RESPIRATORY_TRACT

## 2021-11-20 MED ORDER — AEROCHAMBER PLUS FLO-VU MEDIUM MISC
1.0000 | Freq: Once | Status: AC
  Administered 2021-11-20: 1

## 2021-11-20 MED ORDER — AMOXICILLIN-POT CLAVULANATE 875-125 MG PO TABS: 1.0000 | ORAL_TABLET | Freq: Two times a day (BID) | ORAL | 0 refills | Status: DC

## 2021-11-20 NOTE — ED Triage Notes (Addendum)
Patient having abdominal pain and diarrhea for 4 days. States her stool is now liquid and lime green.  No known sick exposure, no one at home with the same symptoms, no dietary or medication changes.   Patient has a random cough for 2 weeks. No other sick symptoms.   Patient has been taking robitussin and honey for the cough for a week daily. Patient states her daughter had tonsillitis and she drank after her 11/09/21.

## 2021-11-20 NOTE — Discharge Instructions (Addendum)
Use the albuterol inhaler with a spacer 2 puffs every 4-6 hours if needed for shortness of breath or wheezing.  Try saline nasal spray several times a day to help congestion drain from your nose instead of down the back your throat.  You can continue using throat lozenges.  You might also try gargling with salt water.  Avoid fried, high fat, milk/dairy foods until your diarrhea is all better. See the handout attached here for food choices.   Pay close attention to your abdominal pain. If it is not getting better, you will need to be checked again, likely in the ER.

## 2021-11-22 NOTE — ED Provider Notes (Signed)
[facility]    CSN: [id number] Arrival date & time: 11/20/21  1804      History   Chief Complaint Chief Complaint  Patient presents with   Abdominal Pain   Diarrhea   Cough    HPI Robin Davis is a 41 y.o. female. Patient having abdominal pain and diarrhea for 4 days. States her stool is now liquid and lime green.  No known sick exposure, no one at home with the same symptoms, no dietary or medication changes.  Patient reports symptoms feel like when she had diverticulitis in May.  At that time, patient was prescribed Augmentin but patient did not complete the entire prescription as she was feeling better.  She thinks she completed 6 days of antibiotics out of the 10 prescribed.  Denies fever or chills.  Denies nausea or vomiting.  No change in eating habits.  She also has continued to eat nuts.  Patient reports her only meal today was a fried chicken sandwich, and her diet yesterday consisted of fried chicken, collards, and macaroni and cheese.  Abdominal pain is generalized; pain is intermittent, and feels sharp when it occurs.  Pain may last a few seconds, and resolves itself.  Patient has had a cough for 2 weeks.  Denies nasal congestion but does endorse postnasal drainage.  Denies sore throat or ear pain. Patient has been taking robitussin and honey for the cough for a week daily without relief.  Patient has been using her albuterol inhaler with no relief.   Abdominal Pain Associated symptoms: cough and diarrhea   Associated symptoms: no chills, no constipation, no dysuria, no fever, no nausea, no shortness of breath, no sore throat and no vomiting   Diarrhea Associated symptoms: abdominal pain   Associated symptoms: no chills, no fever and no vomiting   Cough Associated symptoms: no chills, no ear pain, no fever, no shortness of breath, no sore throat and no wheezing     Past Medical History:  Diagnosis Date   Anxiety    COVID-19    Foot pain     Hypertension    Obesity     Patient Active Problem List   Diagnosis Date Noted   Diverticulitis 10/15/2021   Encounter for gynecological examination without abnormal finding 10/15/2021   IUD (intrauterine device) in place 10/15/2021   Sinus tachycardia 01/27/2020   Essential hypertension 01/27/2020   Pneumonia due to COVID-19 virus 01/24/2020   Depression 01/24/2020   Hypokalemia 01/24/2020   Acute on chronic respiratory failure with hypoxia (HCC) 01/24/2020   Snoring 02/22/2019    Past Surgical History:  Procedure Laterality Date   BREAST REDUCTION SURGERY     CHOLECYSTECTOMY     REDUCTION MAMMAPLASTY  2006    OB History     Gravida  5   Para  2   Term  2   Preterm  0   AB  2   Living  2      SAB  0   IAB  2   Ectopic  0   Multiple  0   Live Births  2            Home Medications    Prior to Admission medications   Medication Sig Start Date End Date Taking? Authorizing Provider  amLODipine (NORVASC) 10 MG tablet Take 10 mg by mouth daily. 12/28/19  Yes [provider]  amoxicillin-clavulanate (AUGMENTIN) 875-125 MG tablet Take 1 tablet by mouth 2 (two) times daily. 11/20/21  Yes Cathlyn Parsons, NP  benzonatate (TESSALON) 100 MG capsule Take 1 capsule (100 mg total) by mouth every 8 (eight) hours. 11/20/21  Yes Cathlyn Parsons, NP  hydrochlorothiazide (HYDRODIURIL) 12.5 MG tablet Take 12.5 mg by mouth daily. 08/01/21  Yes [provider]  levonorgestrel (MIRENA) 20 MCG/24HR IUD 1 each by Intrauterine route once. Implanted October 2014   Yes [provider]  losartan (COZAAR) 100 MG tablet Take 1 tablet (100 mg total) by mouth daily. 01/29/20  Yes Drema Dallas, MD  methocarbamol (ROBAXIN) 500 MG tablet Take 1 tablet (500 mg total) by mouth 2 (two) times daily. 11/18/21  Yes Jeannie Fend, PA-C  cetirizine (ZYRTEC ALLERGY) 10 MG tablet Take 1 tablet (10 mg total) by mouth daily. 01/16/20   Particia Nearing, PA-C   diclofenac (VOLTAREN) 50 MG EC tablet Take 1 tablet (50 mg total) by mouth 2 (two) times daily for 10 days. 11/18/21 11/28/21  Army Melia A, PA-C  ondansetron (ZOFRAN-ODT) 8 MG disintegrating tablet Take 1 tablet (8 mg total) by mouth every 8 (eight) hours as needed for nausea or vomiting. 09/14/21   Linwood Dibbles, MD  oxyCODONE-acetaminophen (PERCOCET/ROXICET) 5-325 MG tablet Take 1 tablet by mouth every 6 (six) hours as needed for severe pain. 09/14/21   Linwood Dibbles, MD  polyethylene glycol powder (GLYCOLAX/MIRALAX) 17 GM/SCOOP powder Take 17 g by mouth daily as needed. 10/15/21   Venora Maples, MD    Family History Family History  Problem Relation Age of Onset   Hypertension Other    Diabetes Other     Social History Social History   Tobacco Use   Smoking status: Never    Passive exposure: Never   Smokeless tobacco: Never  Vaping Use   Vaping Use: Never used  Substance Use Topics   Alcohol use: Yes    Comment: seldom   Drug use: No     Allergies   Lisinopril, Nitrofurantoin macrocrystal, Tramadol, Flagyl [metronidazole hcl], Hydrocodone-acetaminophen, and Vicodin [hydrocodone-acetaminophen]   Review of Systems Review of Systems  Constitutional:  Negative for chills and fever.  HENT:  Positive for postnasal drip. Negative for congestion, ear pain and sore throat.   Respiratory:  Positive for cough. Negative for shortness of breath and wheezing.   Gastrointestinal:  Positive for abdominal pain and diarrhea. Negative for blood in stool, constipation, nausea and vomiting.  Genitourinary:  Negative for dysuria and flank pain.     Physical Exam Triage Vital Signs ED Triage Vitals  Enc Vitals Group     BP 11/20/21 1855 (!) 139/91     Pulse Rate 11/20/21 1855 95     Resp 11/20/21 1855 16     Temp 11/20/21 1855 98.1 F (36.7 C)     Temp Source 11/20/21 1855 Oral     SpO2 11/20/21 1855 99 %     Weight 11/20/21 1858 230 lb (104.3 kg)     Height 11/20/21 1858 5\' 2"  (1.575  m)     Head Circumference --      Peak Flow --      Pain Score 11/20/21 1858 10     Pain Loc --      Pain Edu? --      Excl. in GC? --    No data found.  Updated Vital Signs BP (!) 139/91 (BP Location: Right Arm)   Pulse 95   Temp 98.1 F (36.7 C) (Oral)   Resp 16   Ht 5\' 2"  (1.575  m)   Wt 230 lb (104.3 kg)   LMP  (LMP Unknown)   SpO2 99%   BMI 42.07 kg/m   Visual Acuity Right Eye Distance:   Left Eye Distance:   Bilateral Distance:    Right Eye Near:   Left Eye Near:    Bilateral Near:     Physical Exam Constitutional:      General: She is not in acute distress.    Appearance: She is well-developed. She is not ill-appearing.  HENT:     Nose: No congestion.     Mouth/Throat:     Mouth: Mucous membranes are moist.     Pharynx: Oropharynx is clear.  Cardiovascular:     Rate and Rhythm: Normal rate and regular rhythm.  Pulmonary:     Effort: Pulmonary effort is normal.     Breath sounds: Normal breath sounds.     Comments: Frequent coughing spells Abdominal:     General: Abdomen is flat. Bowel sounds are normal.     Palpations: Abdomen is soft.     Tenderness: There is no abdominal tenderness. There is no right CVA tenderness or left CVA tenderness.  Neurological:     Mental Status: She is alert.      UC Treatments / Results  Labs (all labs ordered are listed, but only abnormal results are displayed) Labs Reviewed - No data to display  EKG   Radiology No results found.  Procedures Procedures (including critical care time)  Medications Ordered in UC Medications  albuterol (VENTOLIN HFA) 108 (90 Base) MCG/ACT inhaler 2 puff (2 puffs Inhalation Given 11/20/21 1934)  AeroChamber Plus Flo-Vu Medium MISC 1 each (1 each Other Given 11/20/21 1934)    Initial Impression / Assessment and Plan / UC Course  I have reviewed the triage vital signs and the nursing notes.  Pertinent labs & imaging results that were available during my care of the patient  were reviewed by me and considered in my medical decision making (see chart for details).    Patient I discussed possible causes of her symptoms.  It is possible that her diet is contributing to her diarrhea and her abdominal pain.  We discussed diet for diarrhea.  Patient is convinced that her symptoms are related to her diverticulitis that she had in May.  It is possible this is a flare of diverticulitis as patient did not complete her antibiotics as prescribed in May and has been eating foods putting her at risk for diverticulitis.  We discussed obtaining another CT scan to check for diverticulitis and the costs and risks associated with not versus treating again for diverticulitis with antibiotics, which, their own risks.  Patient prefers to try a trial of antibiotics.  She understands to take the entire course.  She understands that if symptoms do not improve or if they change or worsen, she is to seek care in an emergency room.  Regarding her cough, patient and I discussed appropriate technique for use of albuterol inhalers.  I suspect patient has not been using her albuterol inhaler properly.  Given an inhaler with a spacer here in urgent care, taught proper use of devices, and demonstrated good return of understanding.  While patient did not report shortness of breath, and complained only of frequent coughing spasms, post albuterol use patient reports feeling like it is easier to breathe and her coughing is significantly reduced.  Prescribed benzonatate as requested.  We discussed that symptoms are likely due to postnasal drainage and she  should use supportive care measures for that problem including nasal saline.   Final Clinical Impressions(s) / UC Diagnoses   Final diagnoses:  Generalized abdominal pain  Diarrhea, unspecified type  Acute cough     Discharge Instructions      Use the albuterol inhaler with a spacer 2 puffs every 4-6 hours if needed for shortness of breath or  wheezing.  Try saline nasal spray several times a day to help congestion drain from your nose instead of down the back your throat.  You can continue using throat lozenges.  You might also try gargling with salt water.  Avoid fried, high fat, milk/dairy foods until your diarrhea is all better. See the handout attached here for food choices.   Pay close attention to your abdominal pain. If it is not getting better, you will need to be checked again, likely in the ER.    ED Prescriptions     Medication Sig Dispense Auth. Provider   benzonatate (TESSALON) 100 MG capsule Take 1 capsule (100 mg total) by mouth every 8 (eight) hours. 21 capsule Cathlyn Parsons, NP   amoxicillin-clavulanate (AUGMENTIN) 875-125 MG tablet Take 1 tablet by mouth 2 (two) times daily. 20 tablet Cathlyn Parsons, NP      PDMP not reviewed this encounter.   Cathlyn Parsons, NP 11/22/21 1322

## 2022-03-12 ENCOUNTER — Other Ambulatory Visit: Payer: Self-pay

## 2022-03-12 ENCOUNTER — Encounter (HOSPITAL_COMMUNITY): Payer: Self-pay | Admitting: Emergency Medicine

## 2022-03-12 ENCOUNTER — Ambulatory Visit (HOSPITAL_COMMUNITY)
Admission: EM | Admit: 2022-03-12 | Discharge: 2022-03-12 | Disposition: A | Discharge status: Home / Self Care | Payer: Commercial Managed Care - HMO | Attending: Urgent Care | Admitting: Urgent Care

## 2022-03-12 DIAGNOSIS — H60549 Acute eczematoid otitis externa, unspecified ear: Secondary | ICD-10-CM | POA: Diagnosis not present

## 2022-03-12 DIAGNOSIS — R0981 Nasal congestion: Secondary | ICD-10-CM

## 2022-03-12 DIAGNOSIS — J3489 Other specified disorders of nose and nasal sinuses: Secondary | ICD-10-CM

## 2022-03-12 DIAGNOSIS — K120 Recurrent oral aphthae: Secondary | ICD-10-CM

## 2022-03-12 LAB — POCT RAPID STREP A, ED / UC: Streptococcus, Group A Screen (Direct): NEGATIVE

## 2022-03-12 MED ORDER — VALACYCLOVIR HCL 1 G PO TABS: 2000.0000 mg | ORAL_TABLET | Freq: Two times a day (BID) | ORAL | 0 refills | Status: AC

## 2022-03-12 MED ORDER — LIDOCAINE VISCOUS HCL 2 % MT SOLN: 5.0000 mL | Freq: Four times a day (QID) | OROMUCOSAL | 0 refills | Status: DC | PRN

## 2022-03-12 MED ORDER — FLUOCINOLONE ACETONIDE 0.01 % OT OIL: 5.0000 [drp] | TOPICAL_OIL | Freq: Two times a day (BID) | OTIC | 0 refills | Status: AC

## 2022-03-12 NOTE — ED Triage Notes (Signed)
Sore throat for 3 days, throat burns.  Denies fever, denies runny nose.  Patient reports ears are itching. Patient is concerned for strep or other bacterial infection  Has had tylenol

## 2022-03-12 NOTE — ED Provider Notes (Signed)
[facility]    CSN: [id number] Arrival date & time: 03/12/22  1755      History   Chief Complaint Chief Complaint  Patient presents with   Sore Throat    HPI Robin Davis is a 41 y.o. female.   Pleasant 41 year old female presents today due to concerns of a sore throat.  She states over the past 2 to 3 days she has noticed discomfort in the back of her throat.  Denies any dysphagia or trouble swallowing.  No drooling.  States that she was able to eat curry and other spicy foods without any problem, but drank pineapple juice and felt excruciating discomfort.  Reports burning and stinging.  Denies any fever.  Does have some nasal congestion.  States she choked on a thick jellylike substance from her postnasal drainage that she coughed up and was tinged with blood.  This happened 1 time.  Patient otherwise denies a cough.  She denies shortness of breath or chest pain.  She denies any rash.  No abdominal symptoms.  No headache or sinus pressure.  She does report ear itching, but denies ear pain. Tried tylenol with mild relief.    Sore Throat    Past Medical History:  Diagnosis Date   Anxiety    COVID-19    Foot pain    Hypertension    Obesity     Patient Active Problem List   Diagnosis Date Noted   Diverticulitis 10/15/2021   Encounter for gynecological examination without abnormal finding 10/15/2021   IUD (intrauterine device) in place 10/15/2021   Sinus tachycardia 01/27/2020   Essential hypertension 01/27/2020   Pneumonia due to COVID-19 virus 01/24/2020   Depression 01/24/2020   Hypokalemia 01/24/2020   Acute on chronic respiratory failure with hypoxia (HCC) 01/24/2020   Snoring 02/22/2019    Past Surgical History:  Procedure Laterality Date   BREAST REDUCTION SURGERY     CHOLECYSTECTOMY     REDUCTION MAMMAPLASTY  2006    OB History     Gravida  5   Para  2   Term  2   Preterm  0   AB  2   Living  2      SAB  0   IAB  2    Ectopic  0   Multiple  0   Live Births  2            Home Medications    Prior to Admission medications   Medication Sig Start Date End Date Taking? Authorizing Provider  Fluocinolone Acetonide 0.01 % OIL Place 5 drops in ear(s) in the morning and at bedtime for 7 days. 03/12/22 03/19/22 Yes Noha Milberger L, PA  magic mouthwash (lidocaine, diphenhydrAMINE, alum & mag hydroxide) suspension Swish and spit 5 mLs 4 (four) times daily as needed for mouth pain. 03/12/22  Yes Shubh Chiara L, PA  valACYclovir (VALTREX) 1000 MG tablet Take 2 tablets (2,000 mg total) by mouth 2 (two) times daily for 1 day. 03/12/22 03/13/22 Yes Cass Edinger L, PA  amLODipine (NORVASC) 10 MG tablet Take 10 mg by mouth daily. 12/28/19   [provider]  cetirizine (ZYRTEC ALLERGY) 10 MG tablet Take 1 tablet (10 mg total) by mouth daily. 01/16/20   Particia Nearing, PA-C  hydrochlorothiazide (HYDRODIURIL) 12.5 MG tablet Take 12.5 mg by mouth daily. 08/01/21   [provider]  levonorgestrel (MIRENA) 20 MCG/24HR IUD 1 each by Intrauterine route once. Implanted October 2014  [provider]  losartan (COZAAR) 100 MG tablet Take 1 tablet (100 mg total) by mouth daily. 01/29/20   Drema Dallas, MD  polyethylene glycol powder [facility]) 17 GM/SCOOP powder Take 17 g by mouth daily as needed. 10/15/21   Venora Maples, MD    Family History Family History  Problem Relation Age of Onset   Hypertension Other    Diabetes Other     Social History Social History   Tobacco Use   Smoking status: Never    Passive exposure: Never   Smokeless tobacco: Never  Vaping Use   Vaping Use: Never used  Substance Use Topics   Alcohol use: Yes    Comment: seldom   Drug use: No     Allergies   Lisinopril, Nitrofurantoin macrocrystal, Tramadol, Flagyl [metronidazole hcl], Hydrocodone-acetaminophen, and Vicodin [hydrocodone-acetaminophen]   Review of Systems Review of Systems As  per HPI  Physical Exam Triage Vital Signs ED Triage Vitals  Enc Vitals Group     BP 03/12/22 1844 (!) 149/87     Pulse Rate 03/12/22 1844 86     Resp 03/12/22 1844 20     Temp 03/12/22 1844 98.1 F (36.7 C)     Temp Source 03/12/22 1844 Oral     SpO2 03/12/22 1844 98 %     Weight --      Height --      Head Circumference --      Peak Flow --      Pain Score 03/12/22 1840 10     Pain Loc --      Pain Edu? --      Excl. in GC? --    No data found.  Updated Vital Signs BP (!) 149/87 (BP Location: Right Arm) Comment (BP Location): large cuff  Pulse 86   Temp 98.1 F (36.7 C) (Oral)   Resp 20   SpO2 98%   Visual Acuity Right Eye Distance:   Left Eye Distance:   Bilateral Distance:    Right Eye Near:   Left Eye Near:    Bilateral Near:     Physical Exam Vitals and nursing note reviewed.  Constitutional:      General: She is not in acute distress.    Appearance: She is well-developed. She is obese. She is not ill-appearing, toxic-appearing or diaphoretic.  HENT:     Head: Normocephalic and atraumatic.     Right Ear: Tympanic membrane and ear canal normal. No drainage, swelling or tenderness. No middle ear effusion. Tympanic membrane is not erythematous.     Left Ear: Tympanic membrane and ear canal normal. No drainage, swelling or tenderness.  No middle ear effusion. Tympanic membrane is not erythematous.     Ears:     Comments: Dry ear canals    Nose: Congestion and rhinorrhea (thick yellow mucous to enlarged turbinates, enlarged superficial blood vessel without active bleeding) present.     Mouth/Throat:     Mouth: Mucous membranes are moist. Oral lesions (roughly 8 scattered ulcerations to the soft palate bilaterally) present.     Pharynx: Oropharynx is clear. Uvula midline. No pharyngeal swelling, oropharyngeal exudate, posterior oropharyngeal erythema or uvula swelling.     Tonsils: No tonsillar exudate.  Eyes:     Extraocular Movements:     Right eye: Normal  extraocular motion.     Left eye: Normal extraocular motion.     Conjunctiva/sclera: Conjunctivae normal.     Pupils: Pupils are equal, round, and reactive to  light.  Cardiovascular:     Rate and Rhythm: Normal rate.  Pulmonary:     Effort: Pulmonary effort is normal. No respiratory distress.  Musculoskeletal:     Cervical back: Normal range of motion and neck supple.  Lymphadenopathy:     Cervical: No cervical adenopathy.  Skin:    General: Skin is warm and dry.     Coloration: Skin is not pale.     Findings: No erythema or rash.  Neurological:     General: No focal deficit present.     Mental Status: She is alert and oriented to person, place, and time.  Psychiatric:        Mood and Affect: Mood normal.        Behavior: Behavior normal.      UC Treatments / Results  Labs (all labs ordered are listed, but only abnormal results are displayed) Labs Reviewed  POCT RAPID STREP A, ED / UC    EKG   Radiology No results found.  Procedures Procedures (including critical care time)  Medications Ordered in UC Medications - No data to display  Initial Impression / Assessment and Plan / UC Course  I have reviewed the triage vital signs and the nursing notes.  Pertinent labs & imaging results that were available during my care of the patient were reviewed by me and considered in my medical decision making (see chart for details).     Oral aphthae - strep test negative. Viral aphthae noted. Magic mouthwash ordered. Will do trial of valtrex Nasal congestion - likely the cause of the mucous she coughed up. No additional s/sx of URI. Recommended OTC flonase vs saline Eczema B ear canal - topical fluocinolone oil ordered.   Final Clinical Impressions(s) / UC Diagnoses   Final diagnoses:  Aphthae, oral  Nasal congestion with rhinorrhea  Eczema of external auditory canal     Discharge Instructions      Your strep test is negative.  The pain in your throat is due to  the oral aphthae.  These are painful ulcerations on your soft palate. Please use the Magic mouthwash as needed. This will numb the back of your throat. Take the antiviral as prescribed. Do not share food or kiss on the lips, this is contagious. I have called in an ear drop to help with the itching. Use as needed.     ED Prescriptions     Medication Sig Dispense Auth. Provider   Fluocinolone Acetonide 0.01 % OIL Place 5 drops in ear(s) in the morning and at bedtime for 7 days. 20 mL Janin Kozlowski L, PA   valACYclovir (VALTREX) 1000 MG tablet Take 2 tablets (2,000 mg total) by mouth 2 (two) times daily for 1 day. 4 tablet Burman Bruington L, PA   magic mouthwash (lidocaine, diphenhydrAMINE, alum & mag hydroxide) suspension Swish and spit 5 mLs 4 (four) times daily as needed for mouth pain. 120 mL Adea Geisel L, PA      PDMP not reviewed this encounter.   Chaney Malling, [place] 03/12/22 [phone]

## 2022-03-12 NOTE — ED Notes (Signed)
Throat swab in lab 

## 2022-03-12 NOTE — Discharge Instructions (Signed)
Your strep test is negative.  The pain in your throat is due to the oral aphthae.  These are painful ulcerations on your soft palate. Please use the Magic mouthwash as needed. This will numb the back of your throat. Take the antiviral as prescribed. Do not share food or kiss on the lips, this is contagious. I have called in an ear drop to help with the itching. Use as needed.

## 2022-03-19 ENCOUNTER — Ambulatory Visit (HOSPITAL_COMMUNITY)
Admission: EM | Admit: 2022-03-19 | Discharge: 2022-03-19 | Disposition: A | Discharge status: Home / Self Care | Payer: Commercial Managed Care - HMO | Attending: Physician Assistant | Admitting: Physician Assistant

## 2022-03-19 ENCOUNTER — Encounter (HOSPITAL_COMMUNITY): Payer: Self-pay

## 2022-03-19 DIAGNOSIS — E119 Type 2 diabetes mellitus without complications: Secondary | ICD-10-CM | POA: Diagnosis not present

## 2022-03-19 DIAGNOSIS — J302 Other seasonal allergic rhinitis: Secondary | ICD-10-CM

## 2022-03-19 DIAGNOSIS — J9801 Acute bronchospasm: Secondary | ICD-10-CM | POA: Diagnosis not present

## 2022-03-19 LAB — CBG MONITORING, ED: Glucose-Capillary: 101 mg/dL — ABNORMAL HIGH (ref 70–99)

## 2022-03-19 MED ORDER — BUDESONIDE-FORMOTEROL FUMARATE 80-4.5 MCG/ACT IN AERO: 2.0000 | INHALATION_SPRAY | Freq: Two times a day (BID) | RESPIRATORY_TRACT | 1 refills | Status: DC

## 2022-03-19 MED ORDER — AEROCHAMBER PLUS FLO-VU LARGE MISC: 1.0000 | Freq: Once | 0 refills | Status: AC

## 2022-03-19 MED ORDER — CETIRIZINE HCL 10 MG PO TABS: 10.0000 mg | ORAL_TABLET | Freq: Every day | ORAL | 1 refills | Status: DC

## 2022-03-19 MED ORDER — BENZONATATE 100 MG PO CAPS: 100.0000 mg | ORAL_CAPSULE | Freq: Three times a day (TID) | ORAL | 0 refills | Status: DC

## 2022-03-19 NOTE — ED Provider Notes (Signed)
[facility]    CSN: [id number] Arrival date & time: 03/19/22  1844      History   Chief Complaint Chief Complaint  Patient presents with   Cough    HPI Robin Davis is a 41 y.o. female.   Patient presents today with a 5 to 6-day history of dry cough.  She reports that she is coughing so much that she has had posttussive emesis and has muscle pain and abdominal pain due to the coughing.  She denies any additional symptoms including congestion, fever, sore throat, nausea, vomiting, shortness of breath.  Does report she has some chest tightness and wheezing overnight when her coughing is severe.  She denies formal diagnosis of asthma but has been using an albuterol inhaler intermittently since having COVID several years ago.  She does have allergies but is not currently taking any medication for this.  She has not tried any over-the-counter medication for symptom management.  She denies any recent antibiotics or steroids.  Reports that she was recently diagnosed with diabetes within the past few weeks and is unsure how her blood sugars have been trending.  She has not used maintenance medication in the past.  She is confident that she is not pregnant.    Past Medical History:  Diagnosis Date   Anxiety    COVID-19    Foot pain    Hypertension    Obesity     Patient Active Problem List   Diagnosis Date Noted   Diverticulitis 10/15/2021   Encounter for gynecological examination without abnormal finding 10/15/2021   IUD (intrauterine device) in place 10/15/2021   Sinus tachycardia 01/27/2020   Essential hypertension 01/27/2020   Pneumonia due to COVID-19 virus 01/24/2020   Depression 01/24/2020   Hypokalemia 01/24/2020   Acute on chronic respiratory failure with hypoxia (HCC) 01/24/2020   Snoring 02/22/2019    Past Surgical History:  Procedure Laterality Date   BREAST REDUCTION SURGERY     CHOLECYSTECTOMY     REDUCTION MAMMAPLASTY  2006    OB History      Gravida  5   Para  2   Term  2   Preterm  0   AB  2   Living  2      SAB  0   IAB  2   Ectopic  0   Multiple  0   Live Births  2            Home Medications    Prior to Admission medications   Medication Sig Start Date End Date Taking? Authorizing Provider  benzonatate (TESSALON) 100 MG capsule Take 1 capsule (100 mg total) by mouth every 8 (eight) hours. 03/19/22  Yes Jondavid Schreier, Denny Peon K, PA-C  budesonide-formoterol (SYMBICORT) 80-4.5 MCG/ACT inhaler Inhale 2 puffs into the lungs in the morning and at bedtime. 03/19/22  Yes Raymar Joiner, Noberto Retort, PA-C  Spacer/Aero-Holding Chambers (AEROCHAMBER PLUS FLO-VU LARGE) MISC 1 each by Other route once for 1 dose. 03/19/22 03/19/22 Yes Alecia Doi K, PA-C  amLODipine (NORVASC) 10 MG tablet Take 10 mg by mouth daily. 12/28/19   [provider]  cetirizine (ZYRTEC ALLERGY) 10 MG tablet Take 1 tablet (10 mg total) by mouth at bedtime. 03/19/22   Jeri Rawlins, Noberto Retort, PA-C  Fluocinolone Acetonide 0.01 % OIL Place 5 drops in ear(s) in the morning and at bedtime for 7 days. 03/12/22 03/19/22  Crain, Whitney L, PA  hydrochlorothiazide (HYDRODIURIL) 12.5 MG tablet Take 12.5 mg by mouth  daily. 08/01/21   [provider]  levonorgestrel (MIRENA) 20 MCG/24HR IUD 1 each by Intrauterine route once. Implanted October 2014    [provider]  losartan (COZAAR) 100 MG tablet Take 1 tablet (100 mg total) by mouth daily. 01/29/20   Drema Dallas, MD  magic mouthwash (lidocaine, diphenhydrAMINE, alum & mag hydroxide) suspension Swish and spit 5 mLs 4 (four) times daily as needed for mouth pain. 03/12/22   Crain, Whitney L, PA  polyethylene glycol powder (GLYCOLAX/MIRALAX) 17 GM/SCOOP powder Take 17 g by mouth daily as needed. 10/15/21   Venora Maples, MD    Family History Family History  Problem Relation Age of Onset   Hypertension Other    Diabetes Other     Social History Social History   Tobacco Use   Smoking status: Never     Passive exposure: Never   Smokeless tobacco: Never  Vaping Use   Vaping Use: Never used  Substance Use Topics   Alcohol use: Yes    Comment: seldom   Drug use: No     Allergies   Lisinopril, Nitrofurantoin macrocrystal, Tramadol, Flagyl [metronidazole hcl], Hydrocodone-acetaminophen, and Vicodin [hydrocodone-acetaminophen]   Review of Systems Review of Systems  Constitutional:  Positive for activity change. Negative for appetite change, fatigue and fever.  HENT:  Negative for congestion, sinus pressure, sneezing and sore throat.   Respiratory:  Positive for cough, chest tightness and wheezing. Negative for shortness of breath.   Cardiovascular:  Negative for chest pain.  Gastrointestinal:  Negative for abdominal pain, diarrhea, nausea and vomiting.  Neurological:  Negative for dizziness, light-headedness and headaches.     Physical Exam Triage Vital Signs ED Triage Vitals  Enc Vitals Group     BP 03/19/22 2005 (!) 161/91     Pulse Rate 03/19/22 2005 (!) 101     Resp 03/19/22 2005 18     Temp 03/19/22 2005 99.5 F (37.5 C)     Temp Source 03/19/22 2005 Oral     SpO2 03/19/22 2005 96 %     Weight --      Height --      Head Circumference --      Peak Flow --      Pain Score 03/19/22 2006 9     Pain Loc --      Pain Edu? --      Excl. in GC? --    No data found.  Updated Vital Signs BP (!) 161/91 (BP Location: Left Arm)   Pulse (!) 101   Temp 99.5 F (37.5 C) (Oral)   Resp 18   SpO2 96%   Visual Acuity Right Eye Distance:   Left Eye Distance:   Bilateral Distance:    Right Eye Near:   Left Eye Near:    Bilateral Near:     Physical Exam Vitals reviewed.  Constitutional:      General: She is awake. She is not in acute distress.    Appearance: Normal appearance. She is well-developed. She is not ill-appearing.     Comments: Very pleasant female appears stated age in no acute distress sitting comfortably in exam room reactive cough with deep  HENT:      Head: Normocephalic and atraumatic.     Right Ear: Tympanic membrane, ear canal and external ear normal. Tympanic membrane is not erythematous or bulging.     Left Ear: Tympanic membrane, ear canal and external ear normal. Tympanic membrane is not erythematous or bulging.  Nose:     Right Sinus: No maxillary sinus tenderness or frontal sinus tenderness.     Left Sinus: No maxillary sinus tenderness or frontal sinus tenderness.     Mouth/Throat:     Pharynx: Uvula midline. No oropharyngeal exudate or posterior oropharyngeal erythema.  Cardiovascular:     Rate and Rhythm: Normal rate and regular rhythm.     Heart sounds: Normal heart sounds, S1 normal and S2 normal. No murmur heard. Pulmonary:     Effort: Pulmonary effort is normal.     Breath sounds: Normal breath sounds. No wheezing, rhonchi or rales.     Comments: Reactive cough with deep breathing Psychiatric:        Behavior: Behavior is cooperative.      UC Treatments / Results  Labs (all labs ordered are listed, but only abnormal results are displayed) Labs Reviewed  CBG MONITORING, ED - Abnormal; Notable for the following components:      Result Value   Glucose-Capillary 101 (*)    All other components within normal limits    EKG   Radiology No results found.  Procedures Procedures (including critical care time)  Medications Ordered in UC Medications - No data to display  Initial Impression / Assessment and Plan / UC Course  I have reviewed the triage vital signs and the nursing notes.  Pertinent labs & imaging results that were available during my care of the patient were reviewed by me and considered in my medical decision making (see chart for details).     No indication for viral testing as patient has been symptomatic for 5 to 6 days and this would not change management.  No evidence of acute infection on physical exam that warrant initiation of antibiotics.  I suspect symptoms are related to  allergy with bronchospasm/allergy induced asthma.  She was encouraged to continue albuterol inhaler and reports she has enough of this medication.  We will start cetirizine daily.  Tessalon was sent in to help manage cough symptoms.  We will start Symbicort.  She was instructed to gargle with water and rinse her mouth following use of this medication to prevent thrush.  She is to rest and drink plenty of fluid.  She did request that we check her blood sugar so she can know how this is trending it was appropriate at 101.  We will defer steroids given new diabetes diagnosis for the time being.  She is to rest and drink plenty of fluid.  Discussed that if her symptoms are not improving within a week she should return for reevaluation.  If she has any worsening symptoms she needs to be seen immediately to which she expressed understanding.  Final Clinical Impressions(s) / UC Diagnoses   Final diagnoses:  Seasonal allergies  Bronchospasm     Discharge Instructions      I believe that you have allergies that are causing asthma-like symptoms.  Start cetirizine daily.  I have called in Tessalon to help manage your cough.  Start Symbicort twice daily.  Make sure to rinse your mouth following use of this medication to prevent thrush.  Continue using your albuterol as needed.  If your symptoms are not improving within a week please return for reevaluation.  If you have any worsening symptoms including chest pain, shortness of breath, worsening cough, nausea/vomiting interfere with oral intake you should be seen immediately.     ED Prescriptions     Medication Sig Dispense Auth. Provider   cetirizine (ZYRTEC ALLERGY)  10 MG tablet Take 1 tablet (10 mg total) by mouth at bedtime. 30 tablet Aaima Gaddie K, PA-C   benzonatate (TESSALON) 100 MG capsule Take 1 capsule (100 mg total) by mouth every 8 (eight) hours. 21 capsule Maximus Hoffert K, PA-C   budesonide-formoterol (SYMBICORT) 80-4.5 MCG/ACT inhaler Inhale  2 puffs into the lungs in the morning and at bedtime. 1 each Jazzmine Kleiman K, PA-C   Spacer/Aero-Holding Chambers (AEROCHAMBER PLUS FLO-VU LARGE) MISC 1 each by Other route once for 1 dose. 1 each Pheobe Sandiford, Noberto Retort, PA-C      PDMP not reviewed this encounter.   Jeani Hawking, PA-C 03/19/22 2039

## 2022-03-19 NOTE — ED Triage Notes (Signed)
Pt c/o dry cough x5 days, now having chest and back pain from coughing. Taking OTC meds with no relief.

## 2022-03-19 NOTE — Discharge Instructions (Addendum)
I believe that you have allergies that are causing asthma-like symptoms.  Start cetirizine daily.  I have called in Tessalon to help manage your cough.  Start Symbicort twice daily.  Make sure to rinse your mouth following use of this medication to prevent thrush.  Continue using your albuterol as needed.  If your symptoms are not improving within a week please return for reevaluation.  If you have any worsening symptoms including chest pain, shortness of breath, worsening cough, nausea/vomiting interfere with oral intake you should be seen immediately.

## 2022-03-28 ENCOUNTER — Inpatient Hospital Stay (HOSPITAL_COMMUNITY)
Admission: EM | Admit: 2022-03-28 | Discharge: 2022-03-31 | DRG: 871 | Disposition: A | Discharge status: Home / Self Care | Payer: Commercial Managed Care - HMO | Attending: Internal Medicine | Admitting: Internal Medicine

## 2022-03-28 ENCOUNTER — Emergency Department (HOSPITAL_COMMUNITY): Payer: Commercial Managed Care - HMO

## 2022-03-28 ENCOUNTER — Encounter (HOSPITAL_COMMUNITY): Payer: Self-pay

## 2022-03-28 ENCOUNTER — Other Ambulatory Visit: Payer: Self-pay

## 2022-03-28 DIAGNOSIS — Z1152 Encounter for screening for COVID-19: Secondary | ICD-10-CM

## 2022-03-28 DIAGNOSIS — J4551 Severe persistent asthma with (acute) exacerbation: Secondary | ICD-10-CM | POA: Diagnosis present

## 2022-03-28 DIAGNOSIS — R0902 Hypoxemia: Secondary | ICD-10-CM | POA: Diagnosis not present

## 2022-03-28 DIAGNOSIS — A419 Sepsis, unspecified organism: Secondary | ICD-10-CM | POA: Diagnosis not present

## 2022-03-28 DIAGNOSIS — Z886 Allergy status to analgesic agent status: Secondary | ICD-10-CM

## 2022-03-28 DIAGNOSIS — Z881 Allergy status to other antibiotic agents status: Secondary | ICD-10-CM

## 2022-03-28 DIAGNOSIS — E119 Type 2 diabetes mellitus without complications: Secondary | ICD-10-CM

## 2022-03-28 DIAGNOSIS — Z79899 Other long term (current) drug therapy: Secondary | ICD-10-CM

## 2022-03-28 DIAGNOSIS — Z888 Allergy status to other drugs, medicaments and biological substances status: Secondary | ICD-10-CM

## 2022-03-28 DIAGNOSIS — I10 Essential (primary) hypertension: Secondary | ICD-10-CM | POA: Diagnosis present

## 2022-03-28 DIAGNOSIS — Z833 Family history of diabetes mellitus: Secondary | ICD-10-CM

## 2022-03-28 DIAGNOSIS — R6510 Systemic inflammatory response syndrome (SIRS) of non-infectious origin without acute organ dysfunction: Secondary | ICD-10-CM | POA: Diagnosis present

## 2022-03-28 DIAGNOSIS — J9601 Acute respiratory failure with hypoxia: Secondary | ICD-10-CM | POA: Diagnosis present

## 2022-03-28 DIAGNOSIS — Z6841 Body Mass Index (BMI) 40.0 and over, adult: Secondary | ICD-10-CM

## 2022-03-28 DIAGNOSIS — Z8249 Family history of ischemic heart disease and other diseases of the circulatory system: Secondary | ICD-10-CM

## 2022-03-28 DIAGNOSIS — J45901 Unspecified asthma with (acute) exacerbation: Secondary | ICD-10-CM | POA: Diagnosis present

## 2022-03-28 DIAGNOSIS — E876 Hypokalemia: Secondary | ICD-10-CM | POA: Diagnosis present

## 2022-03-28 DIAGNOSIS — Z885 Allergy status to narcotic agent status: Secondary | ICD-10-CM

## 2022-03-28 DIAGNOSIS — R0789 Other chest pain: Secondary | ICD-10-CM | POA: Diagnosis present

## 2022-03-28 DIAGNOSIS — E669 Obesity, unspecified: Secondary | ICD-10-CM | POA: Diagnosis present

## 2022-03-28 DIAGNOSIS — Z794 Long term (current) use of insulin: Secondary | ICD-10-CM

## 2022-03-28 DIAGNOSIS — Z7951 Long term (current) use of inhaled steroids: Secondary | ICD-10-CM

## 2022-03-28 DIAGNOSIS — F411 Generalized anxiety disorder: Secondary | ICD-10-CM | POA: Insufficient documentation

## 2022-03-28 HISTORY — DX: Type 2 diabetes mellitus without complications: E11.9

## 2022-03-28 HISTORY — DX: Unspecified asthma, uncomplicated: J45.909

## 2022-03-28 LAB — BASIC METABOLIC PANEL
Anion gap: 8 (ref 5–15)
BUN: 7 mg/dL (ref 6–20)
CO2: 24 mmol/L (ref 22–32)
Calcium: 9.1 mg/dL (ref 8.9–10.3)
Chloride: 103 mmol/L (ref 98–111)
Creatinine, Ser: 0.73 mg/dL (ref 0.44–1.00)
GFR, Estimated: >60 mL/min (ref >60)
Glucose, Bld: 127 mg/dL — ABNORMAL HIGH (ref 70–99)
Potassium: 3.1 mmol/L — ABNORMAL LOW (ref 3.5–5.1)
Sodium: 135 mmol/L (ref 135–145)

## 2022-03-28 LAB — I-STAT BETA HCG BLOOD, ED (MC, WL, AP ONLY): I-stat hCG, quantitative: <5 m[IU]/mL (ref <5)

## 2022-03-28 LAB — CBC
HCT: 37.6 % (ref 36.0–46.0)
Hemoglobin: 12.6 g/dL (ref 12.0–15.0)
MCH: 27.3 pg (ref 26.0–34.0)
MCHC: 33.5 g/dL (ref 30.0–36.0)
MCV: 81.6 fL (ref 80.0–100.0)
Platelets: 333 10*3/uL (ref 150–400)
RBC: 4.61 MIL/uL (ref 3.87–5.11)
RDW: 14 % (ref 11.5–15.5)
WBC: 17.7 10*3/uL — ABNORMAL HIGH (ref 4.0–10.5)
nRBC: 0 % (ref 0.0–0.2)

## 2022-03-28 LAB — RESP PANEL BY RT-PCR (FLU A&B, COVID) ARPGX2
Influenza A by PCR: NEGATIVE
Influenza B by PCR: NEGATIVE
SARS Coronavirus 2 by RT PCR: NEGATIVE

## 2022-03-28 LAB — CBG MONITORING, ED: Glucose-Capillary: 288 mg/dL — ABNORMAL HIGH (ref 70–99)

## 2022-03-28 MED ORDER — IPRATROPIUM-ALBUTEROL 0.5-2.5 (3) MG/3ML IN SOLN
3.0000 mL | Freq: Once | RESPIRATORY_TRACT | Status: AC
  Administered 2022-03-28: 3 mL via RESPIRATORY_TRACT
  Filled 2022-03-28: qty 3

## 2022-03-28 MED ORDER — SODIUM CHLORIDE 0.9 % IV BOLUS
1000.0000 mL | Freq: Once | INTRAVENOUS | Status: AC
  Administered 2022-03-28: 1000 mL via INTRAVENOUS

## 2022-03-28 MED ORDER — POTASSIUM CHLORIDE CRYS ER 20 MEQ PO TBCR
40.0000 meq | EXTENDED_RELEASE_TABLET | Freq: Once | ORAL | Status: AC
  Administered 2022-03-29: 40 meq via ORAL
  Filled 2022-03-28: qty 2

## 2022-03-28 MED ORDER — ALBUTEROL SULFATE (2.5 MG/3ML) 0.083% IN NEBU
10.0000 mg/h | INHALATION_SOLUTION | Freq: Once | RESPIRATORY_TRACT | Status: AC
  Administered 2022-03-29: 10 mg/h via RESPIRATORY_TRACT
  Filled 2022-03-28: qty 12

## 2022-03-28 MED ORDER — ACETAMINOPHEN 325 MG PO TABS
650.0000 mg | ORAL_TABLET | Freq: Once | ORAL | Status: AC
  Administered 2022-03-28: 650 mg via ORAL
  Filled 2022-03-28: qty 2

## 2022-03-28 MED ORDER — MAGNESIUM SULFATE 2 GM/50ML IV SOLN
2.0000 g | Freq: Once | INTRAVENOUS | Status: AC
  Administered 2022-03-28: 2 g via INTRAVENOUS
  Filled 2022-03-28: qty 50

## 2022-03-28 MED ORDER — METHYLPREDNISOLONE SODIUM SUCC 125 MG IJ SOLR
125.0000 mg | Freq: Once | INTRAMUSCULAR | Status: AC
  Administered 2022-03-28: 125 mg via INTRAVENOUS
  Filled 2022-03-28: qty 2

## 2022-03-28 MED ORDER — DIAZEPAM 5 MG PO TABS
5.0000 mg | ORAL_TABLET | Freq: Once | ORAL | Status: AC
  Administered 2022-03-28: 5 mg via ORAL
  Filled 2022-03-28: qty 1

## 2022-03-28 NOTE — ED Triage Notes (Addendum)
Patient has been short of breath for 2 weeks. [place] Spaniel it hurts to breathe. Tried albuterol, but did not help. Urgent care told her that her allergies trigger her asthma. Denies chest pain. Having back pain.

## 2022-03-28 NOTE — ED Provider Notes (Signed)
[facility] DEPT Provider Note   CSN: [id number] Arrival date & time: 03/28/22  1822     History {Add pertinent medical, surgical, social history, OB history to HPI:1} Chief Complaint  Patient presents with   Shortness of Breath    Robin Davis is a 41 y.o. female.  Patient as above with significant medical history as below, including asthma, covid 19 who presents to the ED with complaint of dib. Pt with cough/dib over the past 2 weeks, worse in the last 3 days. Tried her home neb which did not help much, also took her albuterol MDI which also did not help. She reports fever today, did not check temp at home but felt feverish. No chills. She is having some arthralgias over last few days. Coughing that is non-productive. Worried that she has "fluid in my lungs" and that symptoms today feel similar to prior pneumonia. No recent travel or sick contacts she is aware of. No smoking/vaping. Reports she takes her symbicort PRN not BID as prescribed. Asthma managed by PCP. No home oxygen use     Past Medical History:  Diagnosis Date   Anxiety    Asthma    COVID-19    Diabetes mellitus without complication ([place])    Foot pain    Hypertension    Obesity     Past Surgical History:  Procedure Laterality Date   BREAST REDUCTION SURGERY     CHOLECYSTECTOMY     REDUCTION MAMMAPLASTY  2006     The history is provided by the patient. No language interpreter was used.  Shortness of Breath Associated symptoms: cough and fever   Associated symptoms: no abdominal pain, no chest pain, no headaches and no rash        Home Medications Prior to Admission medications   Medication Sig Start Date End Date Taking? Authorizing Provider  amLODipine (NORVASC) 10 MG tablet Take 10 mg by mouth daily. 12/28/19   [provider]  benzonatate (TESSALON) 100 MG capsule Take 1 capsule (100 mg total) by mouth every 8 (eight) hours. 03/19/22   Raspet, Derry Skill, PA-C   budesonide-formoterol (SYMBICORT) 80-4.5 MCG/ACT inhaler Inhale 2 puffs into the lungs in the morning and at bedtime. 03/19/22   Raspet, Derry Skill, PA-C  cetirizine (ZYRTEC ALLERGY) 10 MG tablet Take 1 tablet (10 mg total) by mouth at bedtime. 03/19/22   Raspet, Derry Skill, PA-C  hydrochlorothiazide (HYDRODIURIL) 12.5 MG tablet Take 12.5 mg by mouth daily. 08/01/21   [provider]  levonorgestrel (MIRENA) 20 MCG/24HR IUD 1 each by Intrauterine route once. Implanted October 2014    [provider]  losartan (COZAAR) 100 MG tablet Take 1 tablet (100 mg total) by mouth daily. 01/29/20   Allie Bossier, MD  magic mouthwash (lidocaine, diphenhydrAMINE, alum & mag hydroxide) suspension Swish and spit 5 mLs 4 (four) times daily as needed for mouth pain. 03/12/22   Crain, Whitney L, PA  polyethylene glycol powder (GLYCOLAX/MIRALAX) 17 GM/SCOOP powder Take 17 g by mouth daily as needed. 10/15/21   Clarnce Flock, MD      Allergies    Lisinopril, Nitrofurantoin macrocrystal, Tramadol, Flagyl [metronidazole hcl], Hydrocodone-acetaminophen, and Vicodin [hydrocodone-acetaminophen]    Review of Systems   Review of Systems  Constitutional:  Positive for fatigue and fever. Negative for activity change.  HENT:  Negative for facial swelling and trouble swallowing.   Eyes:  Negative for discharge and redness.  Respiratory:  Positive for cough and shortness of  breath.   Cardiovascular:  Negative for chest pain and palpitations.  Gastrointestinal:  Negative for abdominal pain and nausea.  Genitourinary:  Negative for dysuria and flank pain.  Musculoskeletal:  Positive for arthralgias. Negative for back pain and gait problem.  Skin:  Negative for pallor and rash.  Neurological:  Negative for syncope and headaches.    Physical Exam Updated Vital Signs BP (!) 143/76   Pulse (!) 114   Temp 97.9 F (36.6 C) (Oral)   Resp (!) 31   SpO2 96%  Physical Exam Vitals and nursing note reviewed.   Constitutional:      General: She is not in acute distress.    Appearance: Normal appearance. She is well-developed. She is obese. She is not ill-appearing.  HENT:     Head: Normocephalic and atraumatic.     Right Ear: External ear normal.     Left Ear: External ear normal.     Nose: Nose normal.     Mouth/Throat:     Mouth: Mucous membranes are moist.  Eyes:     General: No scleral icterus.       Right eye: No discharge.        Left eye: No discharge.  Cardiovascular:     Rate and Rhythm: Regular rhythm. Tachycardia present.     Pulses: Normal pulses.     Heart sounds: Normal heart sounds.  Pulmonary:     Effort: Pulmonary effort is normal. Tachypnea present. No accessory muscle usage or respiratory distress.     Breath sounds: Decreased breath sounds present. No wheezing.  Abdominal:     General: Abdomen is flat.     Tenderness: There is no abdominal tenderness.  Musculoskeletal:        General: Normal range of motion.     Cervical back: Normal range of motion.     Right lower leg: No edema.     Left lower leg: No edema.  Skin:    General: Skin is warm and dry.     Capillary Refill: Capillary refill takes less than 2 seconds.  Neurological:     Mental Status: She is alert and oriented to person, place, and time.     GCS: GCS eye subscore is 4. GCS verbal subscore is 5. GCS motor subscore is 6.  Psychiatric:        Mood and Affect: Mood normal.        Behavior: Behavior normal.     ED Results / Procedures / Treatments   Labs (all labs ordered are listed, but only abnormal results are displayed) Labs Reviewed  BASIC METABOLIC PANEL - Abnormal; Notable for the following components:      Result Value   Potassium 3.1 (*)    Glucose, Bld 127 (*)    All other components within normal limits  CBC - Abnormal; Notable for the following components:   WBC 17.7 (*)    All other components within normal limits  CBG MONITORING, ED - Abnormal; Notable for the following  components:   Glucose-Capillary 288 (*)    All other components within normal limits  RESP PANEL BY RT-PCR (FLU A&B, COVID) ARPGX2  I-STAT BETA HCG BLOOD, ED ([facility], [facility], [facility] ONLY)    EKG None  Radiology DG Chest Port 1 View  Result Date: 03/28/2022 CLINICAL DATA:  Shortness of breath x2 weeks EXAM: PORTABLE CHEST 1 VIEW COMPARISON:  01/23/2020 FINDINGS: Lungs are clear.  No pleural effusion or pneumothorax. The heart is normal in size.  IMPRESSION: No evidence of acute cardiopulmonary disease. Electronically Signed   By: Charline Bills M.D.   On: 03/28/2022 19:22    Procedures .Critical Care  Performed by: Sloan Leiter, DO Authorized by: Sloan Leiter, DO   Critical care provider statement:    Critical care time (minutes):  30   Critical care time was exclusive of:  Separately billable procedures and treating other patients   Critical care was necessary to treat or prevent imminent or life-threatening deterioration of the following conditions:  Respiratory failure   Critical care was time spent personally by me on the following activities:  Development of treatment plan with patient or surrogate, discussions with consultants, evaluation of patient's response to treatment, examination of patient, ordering and review of laboratory studies, ordering and review of radiographic studies, ordering and performing treatments and interventions, pulse oximetry, re-evaluation of patient's condition, review of old charts and obtaining history from patient or surrogate   Care discussed with: admitting provider     {Document cardiac monitor, telemetry assessment procedure when appropriate:1}  Medications Ordered in ED Medications  albuterol (PROVENTIL) (2.5 MG/3ML) 0.083% nebulizer solution (has no administration in time range)  potassium chloride SA (KLOR-CON M) CR tablet 40 mEq (has no administration in time range)  sodium chloride 0.9 % bolus 1,000 mL (0 mLs Intravenous Stopped 03/28/22 2134)   magnesium sulfate IVPB 2 g 50 mL (0 g Intravenous Stopped 03/28/22 2045)  methylPREDNISolone sodium succinate (SOLU-MEDROL) 125 mg/2 mL injection 125 mg (125 mg Intravenous Given 03/28/22 2015)  ipratropium-albuterol (DUONEB) 0.5-2.5 (3) MG/3ML nebulizer solution 3 mL (3 mLs Nebulization Given 03/28/22 1935)  acetaminophen (TYLENOL) tablet 650 mg (650 mg Oral Given 03/28/22 1933)  sodium chloride 0.9 % bolus 1,000 mL (1,000 mLs Intravenous New Bag/Given 03/28/22 2208)  ipratropium-albuterol (DUONEB) 0.5-2.5 (3) MG/3ML nebulizer solution 3 mL (3 mLs Nebulization Given 03/28/22 2246)  diazepam (VALIUM) tablet 5 mg (5 mg Oral Given 03/28/22 2246)    ED Course/ Medical Decision Making/ A&P                           Medical Decision Making Amount and/or Complexity of Data Reviewed Labs: ordered. Radiology: ordered.  Risk OTC drugs. Prescription drug management. Decision regarding hospitalization.   This patient presents to the ED with chief complaint(s) of dib, cough, uri s/s with pertinent past medical history of asthma, covid which further complicates the presenting complaint. The complaint involves an extensive differential diagnosis and also carries with it a high risk of complications and morbidity.    The differential diagnosis includes but not limited to In my evaluation of this patient's dyspnea my DDx includes, but is not limited to, pneumonia, pulmonary embolism, pneumothorax, pulmonary edema, metabolic acidosis, asthma, COPD, cardiac cause, anemia, anxiety, etc.   Differential diagnosis for adult fever includes but is not exclusive to community-acquired pneumonia, urinary tract infection, acute cholecystitis, viral syndrome, cellulitis, tick bourne disease,  decubitus ulcer, necrotizing fasciitis, meningitis, encephalitis, influenza, etc.   . Serious etiologies were considered.   The initial plan is to screening labs/ xr/ nebs/ mgso4/ steroids    Additional history  obtained: Additional history obtained from family Records reviewed Primary Care Documents and urgent care notes, home meds  Independent labs interpretation:  The following labs were independently interpreted:  BMP with mildly reduced potassium, will replace orally She has leukocytosis, favor stress response in setting of asthma exacerbation RVP negative   Independent visualization of imaging: -  I independently visualized the following imaging with scope of interpretation limited to determining acute life threatening conditions related to emergency care: Chest x-ray, which revealed no acute process  Cardiac monitoring was reviewed and interpreted by myself which shows sinus tachycardia  Treatment and Reassessment: DuoNeb x2, mag sulfate 2 g, IV fluids, Solu-Medrol, Valium 3 L nasal cannula >> Respiratory status mildly improved, able to de-escalate to 2 L nasal cannula from 3 L however on ambient air the patient does become hypoxic >> We will give 1 hour CAT  Consultation: - Consulted or discussed management/test interpretation w/ external professional: na  Consideration for admission or further workup: Admission was considered    Patient with severe asthma exacerbation and hypoxia requiring supplemental oxygen.  Mild improvement with DuoNebs, steroids, mag sulfate.  She is breathing well on 2 L nasal cannula, not requiring BiPAP.  Recommend admission for asthma exacerbation and hypoxia. Pt/ family is agreeable   Social Determinants of health: Social History   Tobacco Use   Smoking status: Never    Passive exposure: Never   Smokeless tobacco: Never  Vaping Use   Vaping Use: Never used  Substance Use Topics   Alcohol use: Yes    Comment: seldom   Drug use: No      {Document critical care time when appropriate:1} {Document review of labs and clinical decision tools ie heart score, Chads2Vasc2 etc:1}  {Document your independent review of radiology images, and any outside  records:1} {Document your discussion with family members, caretakers, and with consultants:1} {Document social determinants of health affecting pt's care:1} {Document your decision making why or why not admission, treatments were needed:1} Final Clinical Impression(s) / ED Diagnoses Final diagnoses:  Severe persistent asthma with exacerbation  Hypoxia    Rx / DC Orders ED Discharge Orders     None

## 2022-03-29 ENCOUNTER — Encounter (HOSPITAL_COMMUNITY): Payer: Self-pay | Admitting: Internal Medicine

## 2022-03-29 DIAGNOSIS — A419 Sepsis, unspecified organism: Secondary | ICD-10-CM | POA: Diagnosis present

## 2022-03-29 DIAGNOSIS — R0789 Other chest pain: Secondary | ICD-10-CM | POA: Diagnosis present

## 2022-03-29 DIAGNOSIS — Z886 Allergy status to analgesic agent status: Secondary | ICD-10-CM | POA: Diagnosis not present

## 2022-03-29 DIAGNOSIS — Z885 Allergy status to narcotic agent status: Secondary | ICD-10-CM | POA: Diagnosis not present

## 2022-03-29 DIAGNOSIS — Z794 Long term (current) use of insulin: Secondary | ICD-10-CM | POA: Diagnosis not present

## 2022-03-29 DIAGNOSIS — E669 Obesity, unspecified: Secondary | ICD-10-CM | POA: Diagnosis present

## 2022-03-29 DIAGNOSIS — Z8249 Family history of ischemic heart disease and other diseases of the circulatory system: Secondary | ICD-10-CM | POA: Diagnosis not present

## 2022-03-29 DIAGNOSIS — J9601 Acute respiratory failure with hypoxia: Secondary | ICD-10-CM | POA: Diagnosis present

## 2022-03-29 DIAGNOSIS — E119 Type 2 diabetes mellitus without complications: Secondary | ICD-10-CM | POA: Diagnosis present

## 2022-03-29 DIAGNOSIS — I10 Essential (primary) hypertension: Secondary | ICD-10-CM

## 2022-03-29 DIAGNOSIS — J4541 Moderate persistent asthma with (acute) exacerbation: Secondary | ICD-10-CM | POA: Diagnosis not present

## 2022-03-29 DIAGNOSIS — R0902 Hypoxemia: Secondary | ICD-10-CM

## 2022-03-29 DIAGNOSIS — Z833 Family history of diabetes mellitus: Secondary | ICD-10-CM | POA: Diagnosis not present

## 2022-03-29 DIAGNOSIS — J4551 Severe persistent asthma with (acute) exacerbation: Secondary | ICD-10-CM

## 2022-03-29 DIAGNOSIS — Z888 Allergy status to other drugs, medicaments and biological substances status: Secondary | ICD-10-CM | POA: Diagnosis not present

## 2022-03-29 DIAGNOSIS — Z1152 Encounter for screening for COVID-19: Secondary | ICD-10-CM | POA: Diagnosis not present

## 2022-03-29 DIAGNOSIS — Z7951 Long term (current) use of inhaled steroids: Secondary | ICD-10-CM | POA: Diagnosis not present

## 2022-03-29 DIAGNOSIS — Z6841 Body Mass Index (BMI) 40.0 and over, adult: Secondary | ICD-10-CM | POA: Diagnosis not present

## 2022-03-29 DIAGNOSIS — F411 Generalized anxiety disorder: Secondary | ICD-10-CM | POA: Insufficient documentation

## 2022-03-29 DIAGNOSIS — Z79899 Other long term (current) drug therapy: Secondary | ICD-10-CM | POA: Diagnosis not present

## 2022-03-29 DIAGNOSIS — R6510 Systemic inflammatory response syndrome (SIRS) of non-infectious origin without acute organ dysfunction: Secondary | ICD-10-CM | POA: Diagnosis present

## 2022-03-29 DIAGNOSIS — J45901 Unspecified asthma with (acute) exacerbation: Secondary | ICD-10-CM | POA: Diagnosis present

## 2022-03-29 DIAGNOSIS — Z881 Allergy status to other antibiotic agents status: Secondary | ICD-10-CM | POA: Diagnosis not present

## 2022-03-29 DIAGNOSIS — E876 Hypokalemia: Secondary | ICD-10-CM

## 2022-03-29 LAB — CBC WITH DIFFERENTIAL/PLATELET
Abs Immature Granulocytes: 0.45 10*3/uL — ABNORMAL HIGH (ref 0.00–0.07)
Basophils Absolute: 0.1 10*3/uL (ref 0.0–0.1)
Basophils Relative: 0 %
Eosinophils Absolute: 0 10*3/uL (ref 0.0–0.5)
Eosinophils Relative: 0 %
HCT: 37.9 % (ref 36.0–46.0)
Hemoglobin: 12.5 g/dL (ref 12.0–15.0)
Immature Granulocytes: 2 %
Lymphocytes Relative: 5 %
Lymphs Abs: 1 10*3/uL (ref 0.7–4.0)
MCH: 27.3 pg (ref 26.0–34.0)
MCHC: 33 g/dL (ref 30.0–36.0)
MCV: 82.8 fL (ref 80.0–100.0)
Monocytes Absolute: 1.4 10*3/uL — ABNORMAL HIGH (ref 0.1–1.0)
Monocytes Relative: 7 %
Neutro Abs: 18.6 10*3/uL — ABNORMAL HIGH (ref 1.7–7.7)
Neutrophils Relative %: 86 %
Platelets: 340 10*3/uL (ref 150–400)
RBC: 4.58 MIL/uL (ref 3.87–5.11)
RDW: 14.1 % (ref 11.5–15.5)
WBC: 21.5 10*3/uL — ABNORMAL HIGH (ref 4.0–10.5)
nRBC: 0 % (ref 0.0–0.2)

## 2022-03-29 LAB — GLUCOSE, CAPILLARY
Glucose-Capillary: 240 mg/dL — ABNORMAL HIGH (ref 70–99)
Glucose-Capillary: 196 mg/dL — ABNORMAL HIGH (ref 70–99)
Glucose-Capillary: 204 mg/dL — ABNORMAL HIGH (ref 70–99)
Glucose-Capillary: 230 mg/dL — ABNORMAL HIGH (ref 70–99)

## 2022-03-29 LAB — RESPIRATORY PANEL BY PCR

## 2022-03-29 LAB — BLOOD GAS, VENOUS
Acid-base deficit: 2.3 mmol/L — ABNORMAL HIGH (ref 0.0–2.0)
Bicarbonate: 22.5 mmol/L (ref 20.0–28.0)
O2 Saturation: 93 %
Patient temperature: 36.1
pCO2, Ven: 37 mmHg — ABNORMAL LOW (ref 44–60)
pH, Ven: 7.39 (ref 7.25–7.43)
pO2, Ven: 59 mmHg — ABNORMAL HIGH (ref 32–45)

## 2022-03-29 LAB — COMPREHENSIVE METABOLIC PANEL
ALT: 30 U/L (ref 0–44)
AST: 28 U/L (ref 15–41)
Albumin: 4.1 g/dL (ref 3.5–5.0)
Alkaline Phosphatase: 63 U/L (ref 38–126)
Anion gap: 11 (ref 5–15)
BUN: 9 mg/dL (ref 6–20)
CO2: 20 mmol/L — ABNORMAL LOW (ref 22–32)
Calcium: 9 mg/dL (ref 8.9–10.3)
Chloride: 107 mmol/L (ref 98–111)
Creatinine, Ser: 0.87 mg/dL (ref 0.44–1.00)
GFR, Estimated: >60 mL/min (ref >60)
Glucose, Bld: 282 mg/dL — ABNORMAL HIGH (ref 70–99)
Potassium: 3.1 mmol/L — ABNORMAL LOW (ref 3.5–5.1)
Sodium: 138 mmol/L (ref 135–145)
Total Bilirubin: 0.7 mg/dL (ref 0.3–1.2)
Total Protein: 7.7 g/dL (ref 6.5–8.1)

## 2022-03-29 LAB — HEMOGLOBIN A1C
Hgb A1c MFr Bld: 6.8 % — ABNORMAL HIGH (ref 4.8–5.6)
Mean Plasma Glucose: 148.46 mg/dL

## 2022-03-29 LAB — PHOSPHORUS: Phosphorus: 1.5 mg/dL — ABNORMAL LOW (ref 2.5–4.6)

## 2022-03-29 LAB — MAGNESIUM: Magnesium: 2.2 mg/dL (ref 1.7–2.4)

## 2022-03-29 LAB — BRAIN NATRIURETIC PEPTIDE: B Natriuretic Peptide: 65.9 pg/mL (ref 0.0–100.0)

## 2022-03-29 LAB — PROCALCITONIN: Procalcitonin: <0.1 ng/mL

## 2022-03-29 MED ORDER — KETOROLAC TROMETHAMINE 15 MG/ML IJ SOLN
15.0000 mg | Freq: Four times a day (QID) | INTRAMUSCULAR | Status: DC | PRN
  Administered 2022-03-29 – 2022-03-30 (×4): 15 mg via INTRAVENOUS
  Filled 2022-03-29 (×4): qty 1

## 2022-03-29 MED ORDER — METHYLPREDNISOLONE SODIUM SUCC 125 MG IJ SOLR
80.0000 mg | Freq: Two times a day (BID) | INTRAMUSCULAR | Status: DC
  Administered 2022-03-29: 80 mg via INTRAVENOUS
  Filled 2022-03-29: qty 2

## 2022-03-29 MED ORDER — IPRATROPIUM BROMIDE 0.02 % IN SOLN
0.5000 mg | Freq: Four times a day (QID) | RESPIRATORY_TRACT | Status: DC
  Administered 2022-03-29: 0.5 mg via RESPIRATORY_TRACT
  Filled 2022-03-29: qty 2.5

## 2022-03-29 MED ORDER — LORAZEPAM 2 MG/ML IJ SOLN
1.0000 mg | Freq: Four times a day (QID) | INTRAMUSCULAR | Status: DC | PRN
  Administered 2022-03-29: 1 mg via INTRAVENOUS
  Filled 2022-03-29: qty 1

## 2022-03-29 MED ORDER — LEVALBUTEROL HCL 1.25 MG/0.5ML IN NEBU: 1.2500 mg | INHALATION_SOLUTION | Freq: Four times a day (QID) | RESPIRATORY_TRACT | Status: DC

## 2022-03-29 MED ORDER — IPRATROPIUM BROMIDE 0.02 % IN SOLN: 0.5000 mg | Freq: Four times a day (QID) | RESPIRATORY_TRACT | Status: DC

## 2022-03-29 MED ORDER — GUAIFENESIN-DM 100-10 MG/5ML PO SYRP
5.0000 mL | ORAL_SOLUTION | ORAL | Status: DC | PRN
  Administered 2022-03-30 (×2): 5 mL via ORAL
  Filled 2022-03-29 (×3): qty 10

## 2022-03-29 MED ORDER — LEVALBUTEROL HCL 1.25 MG/0.5ML IN NEBU
1.2500 mg | INHALATION_SOLUTION | Freq: Four times a day (QID) | RESPIRATORY_TRACT | Status: DC
  Administered 2022-03-29: 1.25 mg via RESPIRATORY_TRACT
  Filled 2022-03-29: qty 0.5

## 2022-03-29 MED ORDER — LEVALBUTEROL HCL 1.25 MG/0.5ML IN NEBU
1.2500 mg | INHALATION_SOLUTION | RESPIRATORY_TRACT | Status: DC | PRN
  Administered 2022-03-30 (×2): 1.25 mg via RESPIRATORY_TRACT
  Filled 2022-03-29: qty 0.5

## 2022-03-29 MED ORDER — ACETAMINOPHEN 650 MG RE SUPP: 650.0000 mg | Freq: Four times a day (QID) | RECTAL | Status: DC | PRN

## 2022-03-29 MED ORDER — METHYLPREDNISOLONE SODIUM SUCC 40 MG IJ SOLR
40.0000 mg | Freq: Two times a day (BID) | INTRAMUSCULAR | Status: DC
  Administered 2022-03-29 – 2022-03-31 (×4): 40 mg via INTRAVENOUS
  Filled 2022-03-29 (×4): qty 1

## 2022-03-29 MED ORDER — LOSARTAN POTASSIUM 50 MG PO TABS
100.0000 mg | ORAL_TABLET | Freq: Every day | ORAL | Status: DC
  Administered 2022-03-29 – 2022-03-31 (×3): 100 mg via ORAL
  Filled 2022-03-29 (×3): qty 2

## 2022-03-29 MED ORDER — LORATADINE 10 MG PO TABS
10.0000 mg | ORAL_TABLET | Freq: Every day | ORAL | Status: DC
  Administered 2022-03-29 – 2022-03-31 (×3): 10 mg via ORAL
  Filled 2022-03-29 (×3): qty 1

## 2022-03-29 MED ORDER — POTASSIUM CHLORIDE CRYS ER 20 MEQ PO TBCR
40.0000 meq | EXTENDED_RELEASE_TABLET | Freq: Four times a day (QID) | ORAL | Status: AC
  Administered 2022-03-29 (×2): 40 meq via ORAL
  Filled 2022-03-29 (×2): qty 2

## 2022-03-29 MED ORDER — ACETAMINOPHEN 325 MG PO TABS
650.0000 mg | ORAL_TABLET | Freq: Four times a day (QID) | ORAL | Status: DC | PRN
  Administered 2022-03-30 (×2): 650 mg via ORAL
  Filled 2022-03-29 (×2): qty 2

## 2022-03-29 MED ORDER — IPRATROPIUM BROMIDE 0.02 % IN SOLN
0.5000 mg | Freq: Three times a day (TID) | RESPIRATORY_TRACT | Status: DC
  Administered 2022-03-29 – 2022-03-31 (×6): 0.5 mg via RESPIRATORY_TRACT
  Filled 2022-03-29 (×7): qty 2.5

## 2022-03-29 MED ORDER — MELATONIN 3 MG PO TABS
3.0000 mg | ORAL_TABLET | Freq: Every evening | ORAL | Status: DC | PRN
  Administered 2022-03-30: 3 mg via ORAL
  Filled 2022-03-29: qty 1

## 2022-03-29 MED ORDER — INSULIN ASPART 100 UNIT/ML IJ SOLN
0.0000 [IU] | Freq: Three times a day (TID) | INTRAMUSCULAR | Status: DC
  Administered 2022-03-29 (×2): 3 [IU] via SUBCUTANEOUS
  Administered 2022-03-29: 2 [IU] via SUBCUTANEOUS
  Administered 2022-03-30: 5 [IU] via SUBCUTANEOUS
  Administered 2022-03-30: 3 [IU] via SUBCUTANEOUS
  Administered 2022-03-30: 2 [IU] via SUBCUTANEOUS
  Administered 2022-03-31: 3 [IU] via SUBCUTANEOUS
  Administered 2022-03-31: 5 [IU] via SUBCUTANEOUS

## 2022-03-29 MED ORDER — LEVALBUTEROL HCL 1.25 MG/0.5ML IN NEBU
1.2500 mg | INHALATION_SOLUTION | Freq: Three times a day (TID) | RESPIRATORY_TRACT | Status: DC
  Administered 2022-03-29 – 2022-03-31 (×5): 1.25 mg via RESPIRATORY_TRACT
  Filled 2022-03-29 (×6): qty 0.5

## 2022-03-29 NOTE — Progress Notes (Signed)
  Progress Note   Patient: Robin Davis [id number] DOB: 01/02/1981 DOA: 03/28/2022     0 DOS: the patient was seen and examined on 03/29/2022   Brief hospital course: 41 y.o. female with medical history significant for moderate persistent asthma, generalized anxiety disorder, diabetes Beatties mellitus, essential hypertension, allergic rhinitis, who is admitted to [facility] on 03/28/2022 with acute asthma exacerbation after presenting from home to [facility] ED complaining of shortness of breath. Pt admitted for asthma exacerbatoin  Assessment and Plan: #) Acute asthma exacerbation:  -O2 requirements currently remain 2LNC. Pt is O2 naive.  -Audible wheezing heard even as pt is talking -Will continue IV steroid and scheduled bronchodilator -Cont to wean O2 as tolerated   #) Acute hypoxic respiratory distress: - O2 requirements currently 2LNC, wean O2 as tolerated -cont bronchodilator and steroids per above     #) Hypokalemia:  -replaced -Recheck bmet in AM  #) Atypical chest pain:  -Noted to have 3 to 4 days of bilateral anterior chest wall discomfort, reproducible with direct relation over the anterior chest wall which appears suggestive of musculoskeletal etiology in the context of 10 days of ongoing cough.   -cont with analgesia as needed    #) SIRS criteria present:  -Pt presented with fever with Tmax of 100.43F, leukocytosis. Unclear etiology -covid and flu neg. Will check respiratory viral panel   #) Generalized anxiety disorder:  - not on a scheduled SSRI/SNRI or prn bdz as outpatient.   -continued on Prn IV Ativan    #) Type 2 Diabetes Mellitus:  -Most recent hemoglobin in the 6.7%  -Cont SSI as needed   #) Essential Hypertension:  -continue home losartan for now -amlodipine and HCTZ held at time of presentation, would resume as bp allows      Subjective: Still wheezing this AM  Physical Exam: Vitals:   03/29/22 0634 03/29/22 0756 03/29/22 1324  03/29/22 1438  BP:    126/74  Pulse:    (!) 109  Resp:    (!) 21  Temp:    98.2 F (36.8 C)  TempSrc:    Oral  SpO2:  91% 93% 93%  Weight: 109.5 kg     Height: 5\' 2"  (1.575 m)      General exam: Awake, laying in bed, in nad Respiratory system: Normal respiratory effort, audible wheezing even without using stethoscope Cardiovascular system: regular rate, s1, s2 Gastrointestinal system: Soft, nondistended, positive BS Central nervous system: CN2-12 grossly intact, strength intact Extremities: Perfused, no clubbing Skin: Normal skin turgor, no notable skin lesions seen Psychiatry: Mood normal // no visual hallucinations   Data Reviewed:  Labs reviewed: Na 138, K 3.1, Cr 0.87, procal <0.1, WBC 21.5, Hgb 12.5  Family Communication: Pt in room, family at bedside  Disposition: Status is: Inpatient Remains inpatient appropriate because: Severity of illness  Planned Discharge Destination: Home    Author: , MD 03/29/2022 4:13 PM  For on call review [email or web address].

## 2022-03-29 NOTE — Progress Notes (Signed)
Pt said she got her home unit cpap. Asked her let me set it up for her she refused and said her friend will do it. Asked again to let me help, no help needed. Encouraged to call if help needed.

## 2022-03-29 NOTE — H&P (Signed)
History and Physical      Robin Davis MVH:[id number] DOB: 28-Aug-1980 DOA: 03/28/2022  PCP: Department, [facility]  Patient coming from: home   I have personally briefly reviewed patient's old medical records in [place]  Chief Complaint: Shortness of breath  HPI: Robin Davis is a 41 y.o. female with medical history significant for moderate persistent asthma, generalized anxiety disorder, diabetes Beatties mellitus, essential hypertension, allergic rhinitis, who is admitted to [facility] on 03/28/2022 with acute asthma exacerbation after presenting from home to [facility] ED complaining of shortness of breath.   Following history is provided via my discussions with the patient discussion with the patient's friend fianc present at bedside, in addition to my discussions with the EDP history reviewed.  The patient reports approximately 10 days of progressive shortness of breath associated with new onset cough, intermittently productive, producing clear-colored sputum in the absence of any hemoptysis.  Over that timeframe she also notes associated new onset wheezing as well as subjective fever in the absence of chills, full body rigors, or generalized myalgias.  Denies any associated diaphoresis,'s, dizziness, presyncope, or syncope.  Shortness of breath is not associate with any orthopnea, PND, or worsening peripheral edema.  She also denies any new onset calf tenderness or new lower extremity erythema.  No recent trauma or travel.  Symptoms.  Preceded by development of right/rhinorrhea.  Over the last 3 to 4 days, she notes new onset "achy" pain associated with the anterior aspect of the chest wall, worse with palpation, and nonexertional.  Denies any recent abdominal pain, dysuria, gross hematuria.  She also denies any recent neck stiffness, rash.   Reports compliance with her patient respiratory regimen which includes on a scheduled basis.  The last 7 to 10  days, she notes increased frequency of use of her prn albuterol inhaler, without significant ensuing improvement in her respiratory status with this measure.  She confirms that she is a lifelong non-smoker.  No known baseline supplemental oxygen requirements.        ED Course:  Vital signs in the ED were notable for the following: Pressure max 100.6; heart rates in the 120s.  Blood pressures in the 130s to 160s; respiratory rate 17-26, initial oxygen saturation 89% on room air, symptoms improving into the range of 95 to 97% on 2 L nasal cannula.  Labs were notable for the following: BMP notable for the following: Sodium 135, potassium 3.1, bicarbonate 24 creatinine 0.73, lipase 127.  CBC notable for will cell count 17,712.6.  COVID-19/influenza PCR negative.  EKG, which has not yet been released, consistent with sinus tachycardia.  Without evidence of acute ischemic changes.   Imaging and additional notable ED work-up: Per radiology report, chest x-ray shows no evidence of acute cardiopulmonary process, including no evidence of infiltrate, edema, effusion or thorax.,  While in the ED, the following were administered: Acetaminophen 650 mg p.o. x1, albuterol nebulizer, Valium, x1, duo nebulizer treatments x2, Solu-Medrol 125 IV x1, potassium chloride 40 mill colons p.o. x1, 2 g of IV magnesium sulfate, normal saline x2 L bolus.  Subsequently, the patient was admitted for further evaluation and management of acute asthma exacerbation, completed acute Evoxac respiratory distress, with labs also notable for mild hypokalemia.     Review of Systems: As per HPI otherwise 10 point review of systems negative.   Past Medical History:  Diagnosis Date   Anxiety    Asthma    COVID-19    Diabetes mellitus  without complication ([place])    Foot pain    Hypertension    Obesity     Past Surgical History:  Procedure Laterality Date   BREAST REDUCTION SURGERY     CHOLECYSTECTOMY     REDUCTION  MAMMAPLASTY  2006    Social History:  reports that she has never smoked. She has never been exposed to tobacco smoke. She has never used smokeless tobacco. She reports current alcohol use. She reports that she does not use drugs.   Allergies  Allergen Reactions   Lisinopril Itching, Swelling and Cough   Nitrofurantoin Macrocrystal Shortness Of Breath and Other (See Comments)    dizziness Other reaction(s): Other (See Comments) dizziness   Tramadol Shortness Of Breath and Other (See Comments)    sweating   Flagyl [Metronidazole Hcl] Other (See Comments)    Unknown, but patient thinks is causes dizziness & sweating, possible shortness of breath   Hydrocodone-Acetaminophen Other (See Comments)    Other reaction(s): Nausea And Vomiting dizziness   Vicodin [Hydrocodone-Acetaminophen] Nausea And Vomiting and Other (See Comments)    dizziness    Family History  Problem Relation Age of Onset   Hypertension Other    Diabetes Other     Family history reviewed and not pertinent    Prior to Admission medications   Medication Sig Start Date End Date Taking? Authorizing Provider  amLODipine (NORVASC) 10 MG tablet Take 10 mg by mouth daily. 12/28/19   [provider]  benzonatate (TESSALON) 100 MG capsule Take 1 capsule (100 mg total) by mouth every 8 (eight) hours. 03/19/22   Raspet, Derry Skill, PA-C  budesonide-formoterol (SYMBICORT) 80-4.5 MCG/ACT inhaler Inhale 2 puffs into the lungs in the morning and at bedtime. 03/19/22   Raspet, Derry Skill, PA-C  cetirizine (ZYRTEC ALLERGY) 10 MG tablet Take 1 tablet (10 mg total) by mouth at bedtime. 03/19/22   Raspet, Derry Skill, PA-C  hydrochlorothiazide (HYDRODIURIL) 12.5 MG tablet Take 12.5 mg by mouth daily. 08/01/21   [provider]  levonorgestrel (MIRENA) 20 MCG/24HR IUD 1 each by Intrauterine route once. Implanted October 2014    [provider]  losartan (COZAAR) 100 MG tablet Take 1 tablet (100 mg total) by mouth daily.  01/29/20   Allie Bossier, MD  magic mouthwash (lidocaine, diphenhydrAMINE, alum & mag hydroxide) suspension Swish and spit 5 mLs 4 (four) times daily as needed for mouth pain. 03/12/22   Crain, Whitney L, PA  polyethylene glycol powder (GLYCOLAX/MIRALAX) 17 GM/SCOOP powder Take 17 g by mouth daily as needed. 10/15/21   Clarnce Flock, MD     Objective    Physical Exam: Vitals:   03/28/22 2245 03/28/22 2300 03/29/22 0000 03/29/22 0004  BP: 131/82 (!) 143/76 (!) 146/80   Pulse: (!) 114 (!) 114 (!) 114 (!) 118  Resp: 17 (!) 31 (!) 30 (!) 30  Temp:  97.9 F (36.6 C)    TempSrc:  Oral    SpO2: 96% 96% 96% 96%    General: appears to be stated age; alert, orientedl; mildly increased work of breathing noted Skin: warm, dry, no rash Head:  AT/[place] Mouth:  Oral mucosa membranes appear moist, normal dentition Neck: supple; trachea midline Heart:  RRR; did not appreciate any M/R/G Lungs: Faint bilateral expiratory wheezes noted, but otherwise CTAB, did not appreciate any  rales, or rhonchi Abdomen: + BS; soft, ND, NT Vascular: 2+ pedal pulses b/l; 2+ radial pulses b/l Extremities: no peripheral edema, no muscle wasting Neuro: strength and  sensation intact in upper and lower extremities b/l      Labs on Admission: I have personally reviewed following labs and imaging studies  CBC: Recent Labs  Lab 03/28/22 2018  WBC 17.7*  HGB 12.6  HCT 37.6  MCV 81.6  PLT 852   Basic Metabolic Panel: Recent Labs  Lab 03/28/22 2018  NA 135  K 3.1*  CL 103  CO2 24  GLUCOSE 127*  BUN 7  CREATININE 0.73  CALCIUM 9.1   GFR: CrCl cannot be calculated (Unknown ideal weight.). Liver Function Tests: No results for input(s): "AST", "ALT", "ALKPHOS", "BILITOT", "PROT", "ALBUMIN" in the last 168 hours. No results for input(s): "LIPASE", "AMYLASE" in the last 168 hours. No results for input(s): "AMMONIA" in the last 168 hours. Coagulation Profile: No results for input(s): "INR", "PROTIME"  in the last 168 hours. Cardiac Enzymes: No results for input(s): "CKTOTAL", "CKMB", "CKMBINDEX", "TROPONINI" in the last 168 hours. BNP (last 3 results) No results for input(s): "PROBNP" in the last 8760 hours. HbA1C: No results for input(s): "HGBA1C" in the last 72 hours. CBG: Recent Labs  Lab 03/28/22 2331  GLUCAP 288*   Lipid Profile: No results for input(s): "CHOL", "HDL", "LDLCALC", "TRIG", "CHOLHDL", "LDLDIRECT" in the last 72 hours. Thyroid Function Tests: No results for input(s): "TSH", "T4TOTAL", "FREET4", "T3FREE", "THYROIDAB" in the last 72 hours. Anemia Panel: No results for input(s): "VITAMINB12", "FOLATE", "FERRITIN", "TIBC", "IRON", "RETICCTPCT" in the last 72 hours. Urine analysis:    Component Value Date/Time   COLORURINE YELLOW 09/13/2021 1946   APPEARANCEUR CLEAR 09/13/2021 1946   LABSPEC 1.017 09/13/2021 1946   PHURINE 6.5 09/13/2021 1946   GLUCOSEU NEGATIVE 09/13/2021 1946   HGBUR NEGATIVE 09/13/2021 1946   BILIRUBINUR NEGATIVE 09/13/2021 1946   KETONESUR NEGATIVE 09/13/2021 1946   PROTEINUR NEGATIVE 09/13/2021 1946   UROBILINOGEN 1.0 08/20/2014 2001   NITRITE NEGATIVE 09/13/2021 1946   LEUKOCYTESUR NEGATIVE 09/13/2021 1946    Radiological Exams on Admission: DG Chest Port 1 View  Result Date: 03/28/2022 CLINICAL DATA:  Shortness of breath x2 weeks EXAM: PORTABLE CHEST 1 VIEW COMPARISON:  01/23/2020 FINDINGS: Lungs are clear.  No pleural effusion or pneumothorax. The heart is normal in size. IMPRESSION: No evidence of acute cardiopulmonary disease. Electronically Signed   By: Julian Hy M.D.   On: 03/28/2022 19:22      Assessment/Plan    Principal Problem:   Acute asthma exacerbation Active Problems:   Hypokalemia   Acute respiratory failure with hypoxia (HCC)   Essential hypertension   Atypical chest pain   SIRS (systemic inflammatory response syndrome) (HCC)   GAD (generalized anxiety disorder)   DM2 (diabetes mellitus, type 2)  ([employer])      #) Acute asthma exacerbation: In the context of documented history of moderate persistent asthma, the patient presents with 10 days of progressive shortness of breath associated with new onset expiratory wheezing, increased work of breathing, cough, with chest x-ray showing no evidence of acute cardiopulmonary process.  This was in the context of proceeding worsening rhinitis/rhinorrhea leading up to the above symptoms, raising the possibility of component of extrinsic exacerbation secondary to allergic rhinitis due to seasonal allergies, given a documented history of allergic rhinitis on zytec as outpatient.  However, this preceding rhinitis/rhinorrhea also raises the possibility of viral etiology, although COVID-19/influenza PCR negative.  Will refrain from checking viral respiratory panel at this time, as it does not appear that the results of this study would change management at this time.  Appears less  likely, no evidence of acute cardiopulmonary process on chest x-ray.,  The possibility of a false radiographic negative in the setting of mild dehydration is noted.  Check procalcitonin level to further evaluate.  will refrain from initiation of antibiotic's for now, reconsideration if ensuing procalcitonin level found to be elevated.   No clinical or radiographic evidence to suggest acutely decompensated heart failure.  Good compliance with home respiratory regimen.  Lifelong non-smoker.    Of note, setting of persistent sinus tachycardia in the emergency department, will convert her beta-2 agonist albuterol to Xopenex, as further detailed below.    Plan: Scheduled Xopenex /ipratropium nebulizer treatments , as needed Xopenex nebulizer treatments, Solu-Medrol .  Check serum magnesium and phosphorus levels.  Check VBG in the morning to evaluate evidence to suggest respiratory fatigue.  Procalcitonin level.  CMP/CBC in the morning continue home Zyrtec.  Sputum culture, flutter valve.               #) Acute hypoxic respiratory distress: in the context of acute respiratory symptoms and no known baseline supplemental O2 requirements, presenting O2 sat note to be in the high 80s on room air, subsequently improving into the mid 90s on 2 L nasal cannula, thereby meeting criteria for acute hypoxic respiratory distress as opposed to acute hypoxic respiratory failure at this time. Appears to be on basis of acute asthma exacerbation, as above. Presenting CXR shows no evidence of acute cardiopulmonary process.  Differential includes potential contribution from acute lower respiratory tract infection./Influenza PCR negative.  Given the duration of the patient's symptoms, will also add on procalcitonin level, as above.  Presentation appears less suggestive of ACS, in the absence of any associated atypical chest pain, while EKG reportedly shows no evidence of acute ischemic changes, including tensive ST elevation, as further detailed above additionally, clinically, presentation appears less suggestive of acute pulmonary embolism.    Plan: further evaluation/management of presenting acute asthma exacerbation, as above. Monitor continuous pulse ox with prn supplemental O2 to maintain O2 sats greater than or equal to 92%. monitor on telemetry. CMP/CBC in the AM. Check serum Mg and Phos levels. Check blood gas. Flutter valve.  Check sputum culture.  Add on procalcitonin level.  Add on BNP.  Check VBG.             #) Hypokalemia: Presenting serum potassium level 3.1, with suspected contribution from intracellular compartmentalization as a consequence of recurrent beta-2 agonist treatments for her presenting acute asthma exacerbation.  She is status post receipt of 40 mill months of oral potassium chloride in the emergency department this evening.  Plan: Add on serum magnesium level.  Monitor on telemetry.  Repeat CMP in the morning.           #) Atypical chest pain: 3 to 4  days of bilateral anterior chest wall discomfort, reproducible with direct relation over the anterior chest wall which appears suggestive of musculoskeletal etiology in the context of 10 days of ongoing cough.  Not associate with any evidence of typical sounding chest pain, and overall, ACS appears less likely, as further detailed above.  Presentation is less suggestive of acute pulmonary embolism, as above.  Given presenting acute asthma exacerbation opioid painkillers for now in order to reduce risk for suppression of respiratory drive.  In the context of baseline renal function, we will instead pursue prn IV Toradol.  Prn IV Toradol.  Pete CBC with differential in the morning.  Further evaluation management of presenting acute asthma exacerbation, as  above.           #) SIRS criteria present: Presentation associated with  fever, leukocytosis.  However, in the absence of e/o underlying infection at this time,  criteria for sepsis not currently met.  Specifically, chest x-ray shows no evidence of acute cardiopulmonary process, including no evidence of additionally, COVID-19/[place] PCR negative.  Will also check urinalysis to further eval. there is the potential for  non-infectious factors contributing to these SIRS findings, including potential for inflammatory reactive contributions towards leukocytosis, particularly given the sepsis checked after initiation of high-dose IV solumedrol.  Additionally, exacerbating factor acute asthma exacerbation in the form tinnitus, which can independently contribute to activation. . patient appears hemodynamically stable at this time.  Consequently, will refrain from initiation of IV antibiotics at this time.    Plan: Repeat CBC with diff in the AM.  Monitor strict I's and O's and daily weights.  Monitor on telemetry. Refraining from IV abx for now, as above.  Check urinalysis.  Sputum culture.  Add on procalcitonin level.            #) Generalized  anxiety disorder: Documented history of such, although she is not on a scheduled SSRI/SNRI or prn bdz as outpatient.  She notes improvement in her anxiety following dose of Valium administered in the ED this evening.  Consequently, we will place order for prn IV Ativan..  Plan: Prn IV Ativan, as above.              #) Type 2 Diabetes Mellitus: documented history of such, which is managed via lifestyle modifications as an outpatient, in the absence of any patient insulin or systemic agents.  Presenting blood sugar noted to be 127.,  Most recent hemoglobin in the 6.7% when checked in September 2021.  As her degree of outpatient glycemic control is relevant to her hemoglobin A1c level at this time.  Plan: accuchecks QAC and HS with low dose SSI.  Add on hemoglobin A1c level, as above.           #) Essential Hypertension: documented h/o such, with outpatient antihypertensive regimen including HCTZ, losartan, Norvasc.  SBP's in the ED today: 130s to 160s mmHg.   Plan: Close monitoring of subsequent BP via routine VS. continue home losartan for now, while transiently holding outpatient amlodipine and HCTZ.       DVT prophylaxis: SCD's   Code Status: Full code Family Communication: case d/w in patient's fiance, who is present at bedside Disposition Plan: Per Rounding Team Consults called: none;  Admission status: Inpatient     [place] DO [facility] [place]   03/29/2022, 1:07 AM

## 2022-03-29 NOTE — Hospital Course (Signed)
41 y.o. female with medical history significant for moderate persistent asthma, generalized anxiety disorder, diabetes Beatties mellitus, essential hypertension, allergic rhinitis, who is admitted to [facility] on 03/28/2022 with acute asthma exacerbation after presenting from home to [facility] ED complaining of shortness of breath. Pt admitted for asthma exacerbatoin

## 2022-03-30 DIAGNOSIS — R0902 Hypoxemia: Secondary | ICD-10-CM | POA: Diagnosis not present

## 2022-03-30 DIAGNOSIS — J4551 Severe persistent asthma with (acute) exacerbation: Secondary | ICD-10-CM | POA: Diagnosis not present

## 2022-03-30 LAB — CBC
HCT: 40.1 % (ref 36.0–46.0)
Hemoglobin: 13.2 g/dL (ref 12.0–15.0)
MCH: 27.4 pg (ref 26.0–34.0)
MCHC: 32.9 g/dL (ref 30.0–36.0)
MCV: 83.4 fL (ref 80.0–100.0)
Platelets: 374 10*3/uL (ref 150–400)
RBC: 4.81 MIL/uL (ref 3.87–5.11)
RDW: 14.9 % (ref 11.5–15.5)
WBC: 28.1 10*3/uL — ABNORMAL HIGH (ref 4.0–10.5)
nRBC: 0 % (ref 0.0–0.2)

## 2022-03-30 LAB — COMPREHENSIVE METABOLIC PANEL
ALT: 24 U/L (ref 0–44)
AST: 20 U/L (ref 15–41)
Albumin: 3.9 g/dL (ref 3.5–5.0)
Alkaline Phosphatase: 66 U/L (ref 38–126)
Anion gap: 8 (ref 5–15)
BUN: 13 mg/dL (ref 6–20)
CO2: 22 mmol/L (ref 22–32)
Calcium: 9.5 mg/dL (ref 8.9–10.3)
Chloride: 106 mmol/L (ref 98–111)
Creatinine, Ser: 0.84 mg/dL (ref 0.44–1.00)
GFR, Estimated: >60 mL/min (ref >60)
Glucose, Bld: 170 mg/dL — ABNORMAL HIGH (ref 70–99)
Potassium: 4 mmol/L (ref 3.5–5.1)
Sodium: 136 mmol/L (ref 135–145)
Total Bilirubin: 0.7 mg/dL (ref 0.3–1.2)
Total Protein: 7.7 g/dL (ref 6.5–8.1)

## 2022-03-30 LAB — URINALYSIS, ROUTINE W REFLEX MICROSCOPIC
Bilirubin Urine: NEGATIVE
Glucose, UA: 50 mg/dL — AB
Hgb urine dipstick: NEGATIVE
Ketones, ur: NEGATIVE mg/dL
Leukocytes,Ua: NEGATIVE
Nitrite: NEGATIVE
Protein, ur: NEGATIVE mg/dL
Specific Gravity, Urine: 1.023 (ref 1.005–1.030)
pH: 6 (ref 5.0–8.0)

## 2022-03-30 LAB — GLUCOSE, CAPILLARY
Glucose-Capillary: 154 mg/dL — ABNORMAL HIGH (ref 70–99)
Glucose-Capillary: 236 mg/dL — ABNORMAL HIGH (ref 70–99)
Glucose-Capillary: 285 mg/dL — ABNORMAL HIGH (ref 70–99)
Glucose-Capillary: 369 mg/dL — ABNORMAL HIGH (ref 70–99)

## 2022-03-30 LAB — HIV ANTIBODY (ROUTINE TESTING W REFLEX): HIV Screen 4th Generation wRfx: NONREACTIVE

## 2022-03-30 LAB — LACTIC ACID, PLASMA: Lactic Acid, Venous: 2 mmol/L (ref 0.5–1.9)

## 2022-03-30 MED ORDER — SODIUM CHLORIDE 0.9 % IV SOLN
500.0000 mg | INTRAVENOUS | Status: DC
  Administered 2022-03-30 – 2022-03-31 (×2): 500 mg via INTRAVENOUS
  Filled 2022-03-30 (×2): qty 5

## 2022-03-30 MED ORDER — METOPROLOL TARTRATE 25 MG PO TABS
12.5000 mg | ORAL_TABLET | Freq: Two times a day (BID) | ORAL | Status: DC
  Administered 2022-03-30 – 2022-03-31 (×3): 12.5 mg via ORAL
  Filled 2022-03-30 (×3): qty 1

## 2022-03-30 MED ORDER — SODIUM CHLORIDE 0.9 % IV SOLN
2.0000 g | INTRAVENOUS | Status: DC
  Administered 2022-03-30 – 2022-03-31 (×2): 2 g via INTRAVENOUS
  Filled 2022-03-30 (×2): qty 20

## 2022-03-30 MED ORDER — LABETALOL HCL 5 MG/ML IV SOLN
10.0000 mg | INTRAVENOUS | Status: DC | PRN
  Administered 2022-03-30: 10 mg via INTRAVENOUS
  Filled 2022-03-30: qty 4

## 2022-03-30 MED ORDER — INSULIN ASPART 100 UNIT/ML IJ SOLN
5.0000 [IU] | Freq: Once | INTRAMUSCULAR | Status: AC
  Administered 2022-03-30: 5 [IU] via SUBCUTANEOUS

## 2022-03-30 NOTE — Progress Notes (Signed)
Admin. Labetalol 10 mg IV

## 2022-03-30 NOTE — Progress Notes (Signed)
Pt stated she had to have wedding today. HR has continued to be tachy. Assisted pt to family waiting area via wheel chair. During celebration HR increased to 141, with slight dyspnea. Had pt sit in wheel chair, placed on O2 @ 2L per [place]. After resting HR decreased to 128. Currently @ 130. Will administer new metoprolol & monitor

## 2022-03-30 NOTE — Progress Notes (Deleted)
Labetalol 10 mg IV

## 2022-03-30 NOTE — Progress Notes (Signed)
  Progress Note   Patient: Robin Davis [id number]:[id number] DOB: 03-11-1981 DOA: 03/28/2022     1 DOS: the patient was seen and examined on 03/30/2022   Brief hospital course: 41 y.o. female with medical history significant for moderate persistent asthma, generalized anxiety disorder, diabetes Beatties mellitus, essential hypertension, allergic rhinitis, who is admitted to [facility] on 03/28/2022 with acute asthma exacerbation after presenting from home to [facility] ED complaining of shortness of breath. Pt admitted for asthma exacerbatoin  Assessment and Plan: #) Acute asthma exacerbation:  -O2 requirements currently remain 2LNC. Pt is O2 naive.  -wheezing auscultated on exam -Will continue IV steroid and scheduled bronchodilator -Cont to wean O2 as tolerated   #) Acute hypoxic respiratory distress: - O2 requirements currently 2LNC, wean O2 as tolerated -cont bronchodilator and steroids per above     #) Hypokalemia:  -replaced -Recheck bmet in AM  #) Atypical chest pain:  -Noted to have 3 to 4 days of bilateral anterior chest wall discomfort, reproducible with direct relation over the anterior chest wall which appears suggestive of musculoskeletal etiology in the context of 10 days of ongoing cough.   -cont with analgesia as needed    #) sepsis with unclear etiology, at time of admission  -Pt presented with fever with Tmax of 100.71F, leukocytosis. Unclear etiology -covid and flu neg.  -respiratory viral panel neg -procalcitonin is <0.1 -Overnight, noted to have Tmax of 102.3 -checking blood cultures -Start empiric azithromycin and rocephin   #) Generalized anxiety disorder:  - not on a scheduled SSRI/SNRI or prn bdz as outpatient.   -continued on Prn IV Ativan    #) Type 2 Diabetes Mellitus:  -Most recent hemoglobin in the 6.7%  -Cont SSI as needed   #) Essential Hypertension:  -continue home losartan for now -amlodipine and HCTZ held at time of presentation -with  tachycardia, have started metoprolol      Subjective: Reported fevers overnight  Physical Exam: Vitals:   03/30/22 1100 03/30/22 1300 03/30/22 1405 03/30/22 1500  BP:    (!) 155/87  Pulse:    (!) 122  Resp: 15 (!) 27  (!) 23  Temp: 99.7 F (37.6 C)   98.6 F (37 C)  TempSrc: Oral   Oral  SpO2:   96%   Weight:      Height:       General exam: Awake, laying in bed, in nad Respiratory system: Normal respiratory effort, audible wheezing even without using stethoscope Cardiovascular system: regular rate, s1, s2 Gastrointestinal system: Soft, nondistended, positive BS Central nervous system: CN2-12 grossly intact, strength intact Extremities: Perfused, no clubbing Skin: Normal skin turgor, no notable skin lesions seen Psychiatry: Mood normal // no visual hallucinations   Data Reviewed:  Labs reviewed: Na 136, K 4.0, Cr 0.84, WBC 28.1, Hgb 13.2  Family Communication: Pt in room, family at bedside  Disposition: Status is: Inpatient Remains inpatient appropriate because: Severity of illness  Planned Discharge Destination: Home    Author: Rickey Barbara, MD 03/30/2022 4:08 PM  For on call review [email or web address].

## 2022-03-30 NOTE — Progress Notes (Signed)
   03/30/22 0407  Assess: MEWS Score  Temp 100.2 F (37.9 C)  BP (!) 144/83  MAP (mmHg) 101  Pulse Rate (!) 133  Resp (!) 22  SpO2 95 %  O2 Device Nasal Cannula  O2 Flow Rate (L/min) 2 L/min  Assess: MEWS Score  MEWS Temp 0  MEWS Systolic 0  MEWS Pulse 3  MEWS RR 1  MEWS LOC 0  MEWS Score 4  MEWS Score Color Red  Assess: if the MEWS score is Yellow or Red  Were vital signs taken at a resting state? Yes  Focused Assessment Change from prior assessment (see assessment flowsheet)  Does the patient meet 2 or more of the SIRS criteria? Yes  Does the patient have a confirmed or suspected source of infection? No  MEWS guidelines implemented *See Row Information* Yes  Treat  MEWS Interventions Escalated (See documentation below)  Pain Scale 0-10  Pain Score 0  Take Vital Signs  Increase Vital Sign Frequency  Red: Q 1hr X 4 then Q 4hr X 4, if remains red, continue Q 4hrs  Escalate  MEWS: Escalate Red: discuss with charge nurse/RN and provider, consider discussing with RRT  Notify: Charge Nurse/RN  Name of Charge Nurse/RN Notified Pat Patrick, RN  Date Charge Nurse/RN Notified 03/30/22  Time Charge Nurse/RN Notified 1610  Provider Notification  Provider Name/Title Anthoney Harada, RN  Date Provider Notified 03/30/22  Time Provider Notified [phone]  Method of Notification Page  Notification Reason Change in status  Provider response Evaluate remotely  Date of Provider Response 03/30/22  Time of Provider Response 0419  Assess: SIRS CRITERIA  SIRS Temperature  0  SIRS Pulse 1  SIRS Respirations  1  SIRS WBC 1  SIRS Score Sum  3

## 2022-03-31 DIAGNOSIS — J4551 Severe persistent asthma with (acute) exacerbation: Secondary | ICD-10-CM | POA: Diagnosis not present

## 2022-03-31 DIAGNOSIS — R0902 Hypoxemia: Secondary | ICD-10-CM | POA: Diagnosis not present

## 2022-03-31 LAB — CBC
HCT: 36.5 % (ref 36.0–46.0)
Hemoglobin: 11.9 g/dL — ABNORMAL LOW (ref 12.0–15.0)
MCH: 27.2 pg (ref 26.0–34.0)
MCHC: 32.6 g/dL (ref 30.0–36.0)
MCV: 83.3 fL (ref 80.0–100.0)
Platelets: 336 10*3/uL (ref 150–400)
RBC: 4.38 MIL/uL (ref 3.87–5.11)
RDW: 14.8 % (ref 11.5–15.5)
WBC: 27.6 10*3/uL — ABNORMAL HIGH (ref 4.0–10.5)
nRBC: 0 % (ref 0.0–0.2)

## 2022-03-31 LAB — COMPREHENSIVE METABOLIC PANEL
ALT: 29 U/L (ref 0–44)
AST: 18 U/L (ref 15–41)
Albumin: 3.4 g/dL — ABNORMAL LOW (ref 3.5–5.0)
Alkaline Phosphatase: 68 U/L (ref 38–126)
Anion gap: 7 (ref 5–15)
BUN: 17 mg/dL (ref 6–20)
CO2: 23 mmol/L (ref 22–32)
Calcium: 9.5 mg/dL (ref 8.9–10.3)
Chloride: 109 mmol/L (ref 98–111)
Creatinine, Ser: 0.69 mg/dL (ref 0.44–1.00)
GFR, Estimated: >60 mL/min (ref >60)
Glucose, Bld: 257 mg/dL — ABNORMAL HIGH (ref 70–99)
Potassium: 4.5 mmol/L (ref 3.5–5.1)
Sodium: 139 mmol/L (ref 135–145)
Total Bilirubin: 0.7 mg/dL (ref 0.3–1.2)
Total Protein: 7.3 g/dL (ref 6.5–8.1)

## 2022-03-31 LAB — GLUCOSE, CAPILLARY
Glucose-Capillary: 217 mg/dL — ABNORMAL HIGH (ref 70–99)
Glucose-Capillary: 285 mg/dL — ABNORMAL HIGH (ref 70–99)
Glucose-Capillary: 264 mg/dL — ABNORMAL HIGH (ref 70–99)

## 2022-03-31 LAB — STREP PNEUMONIAE URINARY ANTIGEN: Strep Pneumo Urinary Antigen: NEGATIVE

## 2022-03-31 MED ORDER — LEVALBUTEROL HCL 1.25 MG/0.5ML IN NEBU
1.2500 mg | INHALATION_SOLUTION | Freq: Two times a day (BID) | RESPIRATORY_TRACT | Status: DC
  Administered 2022-03-31: 1.25 mg via RESPIRATORY_TRACT
  Filled 2022-03-31: qty 0.5

## 2022-03-31 MED ORDER — METOPROLOL TARTRATE 25 MG PO TABS: 12.5000 mg | ORAL_TABLET | Freq: Two times a day (BID) | ORAL | 0 refills | Status: DC

## 2022-03-31 MED ORDER — KETOROLAC TROMETHAMINE 30 MG/ML IJ SOLN: 30.0000 mg | Freq: Four times a day (QID) | INTRAMUSCULAR | Status: DC | PRN

## 2022-03-31 MED ORDER — HYDRALAZINE HCL 20 MG/ML IJ SOLN: 10.0000 mg | INTRAMUSCULAR | Status: DC | PRN

## 2022-03-31 MED ORDER — AZITHROMYCIN 250 MG PO TABS: ORAL_TABLET | ORAL | 0 refills | Status: DC

## 2022-03-31 MED ORDER — CEFDINIR 300 MG PO CAPS: 300.0000 mg | ORAL_CAPSULE | Freq: Two times a day (BID) | ORAL | 0 refills | Status: AC

## 2022-03-31 MED ORDER — HYDROCHLOROTHIAZIDE 12.5 MG PO TABS
12.5000 mg | ORAL_TABLET | Freq: Every day | ORAL | Status: DC
  Administered 2022-03-31: 12.5 mg via ORAL
  Filled 2022-03-31: qty 1

## 2022-03-31 MED ORDER — INSULIN GLARGINE-YFGN 100 UNIT/ML ~~LOC~~ SOLN
12.0000 [IU] | Freq: Every day | SUBCUTANEOUS | Status: DC
  Administered 2022-03-31: 12 [IU] via SUBCUTANEOUS
  Filled 2022-03-31: qty 0.12

## 2022-03-31 MED ORDER — IPRATROPIUM BROMIDE 0.02 % IN SOLN
0.5000 mg | Freq: Two times a day (BID) | RESPIRATORY_TRACT | Status: DC
  Administered 2022-03-31: 0.5 mg via RESPIRATORY_TRACT
  Filled 2022-03-31: qty 2.5

## 2022-03-31 MED ORDER — PREDNISONE 10 MG PO TABS: ORAL_TABLET | ORAL | 0 refills | Status: AC

## 2022-03-31 MED ORDER — AMLODIPINE BESYLATE 10 MG PO TABS: 10.0000 mg | ORAL_TABLET | Freq: Every day | ORAL | Status: DC

## 2022-03-31 MED ORDER — INSULIN ASPART 100 UNIT/ML IJ SOLN
4.0000 [IU] | Freq: Three times a day (TID) | INTRAMUSCULAR | Status: DC
  Administered 2022-03-31: 4 [IU] via SUBCUTANEOUS

## 2022-03-31 MED ORDER — BASAGLAR KWIKPEN 100 UNIT/ML ~~LOC~~ SOPN: 12.0000 [IU] | PEN_INJECTOR | Freq: Every day | SUBCUTANEOUS | 0 refills | Status: DC

## 2022-03-31 MED ORDER — INSULIN ASPART 100 UNIT/ML FLEXPEN: 4.0000 [IU] | PEN_INJECTOR | Freq: Three times a day (TID) | SUBCUTANEOUS | 0 refills | Status: DC

## 2022-03-31 NOTE — Inpatient Diabetes Management (Signed)
Inpatient Diabetes Program Recommendations  AACE/ADA: New Consensus Statement on Inpatient Glycemic Control (2015)  Target Ranges:  Prepandial:   less than 140 mg/dL      Peak postprandial:   less than 180 mg/dL (1-2 hours)      Critically ill patients:  140 - 180 mg/dL   Lab Results  Component Value Date   GLUCAP 217 (H) 03/31/2022   HGBA1C 6.8 (H) 03/29/2022    Review of Glycemic Control  Latest Reference Range & Units 03/30/22 07:45 03/30/22 11:32 03/30/22 17:08 03/30/22 21:04 03/31/22 07:39  Glucose-Capillary 70 - 99 mg/dL 875 (H) 643 (H) 329 (H) 369 (H) 217 (H)  (H): Data is abnormally high Diabetes history: Type 2 DM Outpatient Diabetes medications: none, DC Current orders for Inpatient glycemic control: Novolog 0-9 units TID Solumedrol 40 mg BID  Inpatient Diabetes Program Recommendations:   If steroids to continue consider: -Adding Semglee 12 units QD (to start now) -Adding Novolog 4 units TID (assuming patient is consuming >50% of meals).   Thanks, Lujean Rave, MSN, RNC-OB Diabetes Coordinator [phone] (8a-5p)

## 2022-03-31 NOTE — Discharge Summary (Signed)
Physician Discharge Summary   Patient: Robin Davis MRN: [id number] DOB: [birth year]  Admit date:     03/28/2022  Discharge date: 03/31/22  Discharge Physician: Marylu Lund   PCP: Department, [facility]   Recommendations at discharge:   Follow with PCP in 1-2 weeks   Discharge Diagnoses: Principal Problem:   Acute asthma exacerbation Active Problems:   Hypokalemia   Acute respiratory failure with hypoxia (HCC)   Essential hypertension   Atypical chest pain   SIRS (systemic inflammatory response syndrome) (HCC)   GAD (generalized anxiety disorder)   DM2 (diabetes mellitus, type 2) (HCC)  Resolved Problems:   * No resolved hospital problems. *  Hospital Course: 41 y.o. female with medical history significant for moderate persistent asthma, generalized anxiety disorder, diabetes Beatties mellitus, essential hypertension, allergic rhinitis, who is admitted to [facility] on 03/28/2022 with acute asthma exacerbation after presenting from home to [facility] ED complaining of shortness of breath. Pt admitted for asthma exacerbatoin  Assessment and Plan: #) Acute asthma exacerbation:  -O2 requirements currently remain 2LNC. Pt is O2 naive.  -wheezing auscultated on exam -Improved with IV steroid and scheduled bronchodilator -O2 weaned to room air. Pt able to ambulate in hall on RA -Have prescribed   #) Acute hypoxic respiratory distress: - O2 requirements currently 2LNC, wean O2 as tolerated -cont bronchodilator and steroids per above     #) Hypokalemia:  -resolved   #) Atypical chest pain:  -Noted to have 3 to 4 days of bilateral anterior chest wall discomfort, reproducible with direct relation over the anterior chest wall which appears suggestive of musculoskeletal etiology in the context of 10 days of ongoing cough.     #) sepsis with unclear etiology, at time of admission  -Pt presented with fever with Tmax of 100.75F, leukocytosis. Unclear  etiology -covid and flu neg.  -respiratory viral panel neg -procalcitonin is <0.1 -Recently noted to have Tmax of 102.3 -blood cx neg x2 -Started empiric azithromycin and rocephin with resolution of fevers.  -Prescribed PO azithromycin and omnicef on d/c   #) Generalized anxiety disorder:  - not on a scheduled SSRI/SNRI or prn bdz as outpatient.      #) Type 2 Diabetes Mellitus:  -Most recent hemoglobin in the 6.7%  -Cont SSI as needed while in hospital -Glucose noted to be in the mid to upper 200's while on steroids.  -Prescribed subq lantus with meal coverage on d/c while pt is on steroids   #) Essential Hypertension:  -continue home losartan for now -amlodipine and HCTZ held at time of presentation -with tachycardia, have started metoprolol this visit, tolerating       Consultants:  Procedures performed:   Disposition: Home Diet recommendation:  Carb modified diet DISCHARGE MEDICATION: Allergies as of 03/31/2022       Reactions   Flagyl [metronidazole Hcl] Shortness Of Breath, Other (See Comments)   Dizziness Sweating   Macrobid [nitrofurantoin Macrocrystal] Shortness Of Breath, Other (See Comments)   Dizziness    Ultram [tramadol] Shortness Of Breath, Other (See Comments)   Sweating    Zestril [lisinopril] Itching, Swelling, Cough   Norco [hydrocodone-acetaminophen] Nausea And Vomiting, Other (See Comments)   Dizziness         Medication List     STOP taking these medications    cetirizine 10 MG tablet Commonly known as: ZyrTEC Allergy   magic mouthwash (lidocaine, diphenhydrAMINE, alum & mag hydroxide) suspension       TAKE these medications  acetaminophen 650 MG CR tablet Commonly known as: TYLENOL Take 1,300 mg by mouth 2 (two) times daily as needed for pain.   amLODipine 10 MG tablet Commonly known as: NORVASC Take 10 mg by mouth at bedtime.   azithromycin 250 MG tablet Commonly known as: Zithromax Take 1 tab po daily x 4 more days,  zero refills Start taking on: April 01, 2022   Basaglar KwikPen 100 UNIT/ML Inject 12 Units into the skin daily.   benzonatate 100 MG capsule Commonly known as: TESSALON Take 1 capsule (100 mg total) by mouth every 8 (eight) hours. What changed:  when to take this reasons to take this   budesonide-formoterol 80-4.5 MCG/ACT inhaler Commonly known as: Symbicort Inhale 2 puffs into the lungs in the morning and at bedtime. What changed:  when to take this reasons to take this   cefdinir 300 MG capsule Commonly known as: OMNICEF Take 1 capsule (300 mg total) by mouth 2 (two) times daily for 4 days. Start taking on: April 01, 2022   hydrochlorothiazide 12.5 MG tablet Commonly known as: HYDRODIURIL Take 12.5 mg by mouth in the morning.   insulin aspart 100 UNIT/ML FlexPen Commonly known as: NOVOLOG Inject 4 Units into the skin 3 (three) times daily with meals.   levonorgestrel 20 MCG/24HR IUD Commonly known as: MIRENA 1 each by Intrauterine route once. Implanted October 2014   losartan 100 MG tablet Commonly known as: COZAAR Take 1 tablet (100 mg total) by mouth daily.   metoprolol tartrate 25 MG tablet Commonly known as: LOPRESSOR Take 0.5 tablets (12.5 mg total) by mouth 2 (two) times daily.   POTASSIUM GLUCONATE PO Take 1 tablet by mouth in the morning. OTC potassium supplement, unknown strength.   predniSONE 10 MG tablet Commonly known as: DELTASONE Take 4 tablets (40 mg total) by mouth daily with breakfast for 3 days, THEN 2 tablets (20 mg total) daily with breakfast for 3 days, THEN 1 tablet (10 mg total) daily with breakfast for 3 days, THEN 0.5 tablets (5 mg total) daily with breakfast for 2 days. Start taking on: March 31, 2022   VITAMIN D-3 PO Take 1 capsule by mouth in the morning. Vitamin D-3, unknown strength.        Follow-up Information     Department, [facility] Follow up in 2 week(s).   Why: Hospital follow up Contact  information: [address] [place] [address] [phone]                Discharge Exam: Ceasar Mons Weights   03/29/22 9371 03/30/22 0500 03/31/22 0500  Weight: 109.5 kg 110.3 kg 107.8 kg   General exam: Awake, laying in bed, in nad Respiratory system: Normal respiratory effort, no wheezing Cardiovascular system: regular rate, s1, s2 Gastrointestinal system: Soft, nondistended, positive BS Central nervous system: CN2-12 grossly intact, strength intact Extremities: Perfused, no clubbing Skin: Normal skin turgor, no notable skin lesions seen Psychiatry: Mood normal // no visual hallucinations   Condition at discharge: fair  The results of significant diagnostics from this hospitalization (including imaging, microbiology, ancillary and laboratory) are listed below for reference.   Imaging Studies: DG Chest Port 1 View  Result Date: 03/28/2022 CLINICAL DATA:  Shortness of breath x2 weeks EXAM: PORTABLE CHEST 1 VIEW COMPARISON:  01/23/2020 FINDINGS: Lungs are clear.  No pleural effusion or pneumothorax. The heart is normal in size. IMPRESSION: No evidence of acute cardiopulmonary disease. Electronically Signed   By: Roselie Awkward.D.  On: 03/28/2022 19:22    Microbiology: Results for orders placed or performed during the hospital encounter of 03/28/22  Resp Panel by RT-PCR (Flu A&B, Covid) Anterior Nasal Swab     Status: None   Collection Time: 03/28/22  8:18 PM   Specimen: Anterior Nasal Swab  Result Value Ref Range Status   SARS Coronavirus 2 by RT PCR NEGATIVE NEGATIVE Final    Comment: (NOTE) SARS-CoV-2 target nucleic acids are NOT DETECTED.  The SARS-CoV-2 RNA is generally detectable in upper respiratory specimens during the acute phase of infection. The lowest concentration of SARS-CoV-2 viral copies this assay can detect is 138 copies/mL. A negative result does not preclude SARS-Cov-2 infection and should not be used as the sole basis for treatment  or other patient management decisions. A negative result may occur with  improper specimen collection/handling, submission of specimen other than nasopharyngeal swab, presence of viral mutation(s) within the areas targeted by this assay, and inadequate number of viral copies(<138 copies/mL). A negative result must be combined with clinical observations, patient history, and epidemiological information. The expected result is Negative.  Fact Sheet for Patients:  EntrepreneurPulse.com.au  Fact Sheet for Healthcare Providers:  IncredibleEmployment.be  This test is no t yet approved or cleared by the [place] FDA and  has been authorized for detection and/or diagnosis of SARS-CoV-2 by FDA under an Emergency Use Authorization (EUA). This EUA will remain  in effect (meaning this test can be used) for the duration of the COVID-19 declaration under Section 564(b)(1) of the Act, 21 U.S.C.section 360bbb-3(b)(1), unless the authorization is terminated  or revoked sooner.       Influenza A by PCR NEGATIVE NEGATIVE Final   Influenza B by PCR NEGATIVE NEGATIVE Final    Comment: (NOTE) The Xpert Xpress SARS-CoV-2/FLU/RSV plus assay is intended as an aid in the diagnosis of influenza from Nasopharyngeal swab specimens and should not be used as a sole basis for treatment. Nasal washings and aspirates are unacceptable for Xpert Xpress SARS-CoV-2/FLU/RSV testing.  Fact Sheet for Patients: EntrepreneurPulse.com.au  Fact Sheet for Healthcare Providers: IncredibleEmployment.be  This test is not yet approved or cleared by the [place] FDA and has been authorized for detection and/or diagnosis of SARS-CoV-2 by FDA under an Emergency Use Authorization (EUA). This EUA will remain in effect (meaning this test can be used) for the duration of the COVID-19 declaration under Section 564(b)(1) of the Act, 21 U.S.C. section  360bbb-3(b)(1), unless the authorization is terminated or revoked.  Performed at [facility], [place] [address], [place], [place] [address]   Respiratory (~20 pathogens) panel by PCR     Status: None   Collection Time: 03/29/22  5:02 PM   Specimen: Nasopharyngeal Swab; Respiratory  Result Value Ref Range Status   Adenovirus NOT DETECTED NOT DETECTED Final   Coronavirus 229E NOT DETECTED NOT DETECTED Final    Comment: (NOTE) The Coronavirus on the Respiratory Panel, DOES NOT test for the novel  Coronavirus (2019 nCoV)    Coronavirus HKU1 NOT DETECTED NOT DETECTED Final   Coronavirus NL63 NOT DETECTED NOT DETECTED Final   Coronavirus OC43 NOT DETECTED NOT DETECTED Final   Metapneumovirus NOT DETECTED NOT DETECTED Final   Rhinovirus / Enterovirus NOT DETECTED NOT DETECTED Final   Influenza A NOT DETECTED NOT DETECTED Final   Influenza B NOT DETECTED NOT DETECTED Final   Parainfluenza Virus 1 NOT DETECTED NOT DETECTED Final   Parainfluenza Virus 2 NOT DETECTED NOT DETECTED Final   Parainfluenza  Virus 3 NOT DETECTED NOT DETECTED Final   Parainfluenza Virus 4 NOT DETECTED NOT DETECTED Final   Respiratory Syncytial Virus NOT DETECTED NOT DETECTED Final   Bordetella pertussis NOT DETECTED NOT DETECTED Final   Bordetella Parapertussis NOT DETECTED NOT DETECTED Final   Chlamydophila pneumoniae NOT DETECTED NOT DETECTED Final   Mycoplasma pneumoniae NOT DETECTED NOT DETECTED Final    Comment: Performed at [facility], [place] [address], [place], [place] [address]  Culture, blood (routine x 2) Call MD if unable to obtain prior to antibiotics being given     Status: None (Preliminary result)   Collection Time: 03/30/22 11:31 AM   Specimen: BLOOD  Result Value Ref Range Status   Specimen Description   Final    BLOOD BLOOD LEFT ARM Performed at [place] [address] [place], [place] [address]    Special Requests   Final    BOTTLES DRAWN  AEROBIC ONLY Blood Culture results may not be optimal due to an excessive volume of blood received in culture bottles Performed at [facility] [place] [address], [place], [place] [address]    Culture   Final    NO GROWTH < 24 HOURS Performed at [place] [address], [place], [place] [address]    Report Status PENDING  Incomplete  Culture, blood (routine x 2) Call MD if unable to obtain prior to antibiotics being given     Status: None (Preliminary result)   Collection Time: 03/30/22 11:35 AM   Specimen: BLOOD  Result Value Ref Range Status   Specimen Description   Final    BLOOD BLOOD LEFT ARM Performed at [place] [facility] [address], [place], [place] [address]    Special Requests   Final    BOTTLES DRAWN AEROBIC ONLY Blood Culture adequate volume Performed at [facility] [address] [place], [place] [address]    Culture   Final    NO GROWTH < 24 HOURS Performed at [facility] [address] [place], [place] [address]    Report Status PENDING  Incomplete    Labs: CBC: Recent Labs  Lab 03/28/22 2018 03/29/22 0342 03/30/22 0329 03/31/22 0410  WBC 17.7* 21.5* 28.1* 27.6*  NEUTROABS  --  18.6*  --   --   HGB 12.6 12.5 13.2 11.9*  HCT 37.6 37.9 40.1 36.5  MCV 81.6 82.8 83.4 83.3  PLT 333 340 374 [id number]   Basic Metabolic Panel: Recent Labs  Lab 03/28/22 2018 03/29/22 0342 03/30/22 0329 03/31/22 0410  NA 135 138 136 139  K 3.1* 3.1* 4.0 4.5  CL 103 107 106 109  CO2 24 20* 22 23  GLUCOSE 127* 282* 170* 257*  BUN 7 9 13 17   CREATININE 0.73 0.87 0.84 0.69  CALCIUM 9.1 9.0 9.5 9.5  MG  --  2.2  --   --   PHOS  --  1.5*  --   --    Liver Function Tests: Recent Labs  Lab 03/29/22 0342 03/30/22 0329 03/31/22 0410  AST 28 20 18   ALT 30 24 29   ALKPHOS 63 66 68  BILITOT 0.7 0.7 0.7  PROT 7.7 7.7 7.3  ALBUMIN 4.1 3.9 3.4*   CBG: Recent Labs  Lab 03/30/22 1132 03/30/22 1708  03/30/22 2104 03/31/22 0739 03/31/22 1156  GLUCAP 236* 285* 369* 217* 285*    Discharge time spent: less than 30 minutes.  Signed: Annie Main  Rhona Leavens, MD [facility] 03/31/2022

## 2022-03-31 NOTE — Progress Notes (Signed)
1028 - Pt start complaining lower sternal CP. & radiating under bilat breast. EKG & VS. Notified Dr. Rhona Leavens & Charge nurse.

## 2022-03-31 NOTE — Progress Notes (Signed)
EKG complete NSR, informed attending.

## 2022-03-31 NOTE — Final Progress Note (Signed)
Discharge instructions provided to pt. With a complete written flowsheet for how to take prednisone daily. Pt verbalized understanding of all information, & states having no further questions at this time.

## 2022-03-31 NOTE — Progress Notes (Signed)
   03/31/22 1458  Vitals  ECG Heart Rate (!) 116  Resp 18  Level of Consciousness  Level of Consciousness Alert  MEWS COLOR  MEWS Score Color Yellow  Oxygen Therapy  SpO2 91 %  O2 Device Room Air  MEWS Score  MEWS Temp 0  MEWS Systolic 0  MEWS Pulse 2  MEWS RR 0  MEWS LOC 0  MEWS Score 2   Pt ambulated on room air around unit x 2 (700 ft.) without oxygen. Tolerated well, 91% Oxygen lowest percentage, most of ambulation pt was 92% & greater.

## 2022-04-01 LAB — LEGIONELLA PNEUMOPHILA SEROGP 1 UR AG: L. pneumophila Serogp 1 Ur Ag: NEGATIVE

## 2022-04-04 LAB — CULTURE, BLOOD (ROUTINE X 2)
Culture: NO GROWTH
Culture: NO GROWTH
Special Requests: ADEQUATE

## 2022-04-30 ENCOUNTER — Other Ambulatory Visit: Payer: Self-pay

## 2022-04-30 ENCOUNTER — Emergency Department (HOSPITAL_COMMUNITY)
Admission: EM | Admit: 2022-04-30 | Discharge: 2022-04-30 | Disposition: A | Discharge status: Home / Self Care | Payer: Commercial Managed Care - HMO | Attending: Emergency Medicine | Admitting: Emergency Medicine

## 2022-04-30 ENCOUNTER — Encounter (HOSPITAL_COMMUNITY): Payer: Self-pay | Admitting: *Deleted

## 2022-04-30 DIAGNOSIS — Z1152 Encounter for screening for COVID-19: Secondary | ICD-10-CM | POA: Diagnosis not present

## 2022-04-30 DIAGNOSIS — J101 Influenza due to other identified influenza virus with other respiratory manifestations: Secondary | ICD-10-CM | POA: Diagnosis not present

## 2022-04-30 DIAGNOSIS — R059 Cough, unspecified: Secondary | ICD-10-CM | POA: Diagnosis present

## 2022-04-30 DIAGNOSIS — Z794 Long term (current) use of insulin: Secondary | ICD-10-CM | POA: Insufficient documentation

## 2022-04-30 DIAGNOSIS — R000 Tachycardia, unspecified: Secondary | ICD-10-CM | POA: Insufficient documentation

## 2022-04-30 LAB — RESP PANEL BY RT-PCR (RSV, FLU A&B, COVID)  RVPGX2
Influenza A by PCR: POSITIVE — AB
Influenza B by PCR: NEGATIVE
Resp Syncytial Virus by PCR: NEGATIVE
SARS Coronavirus 2 by RT PCR: NEGATIVE

## 2022-04-30 MED ORDER — BENZONATATE 100 MG PO CAPS: 100.0000 mg | ORAL_CAPSULE | Freq: Three times a day (TID) | ORAL | 0 refills | Status: DC

## 2022-04-30 NOTE — ED Provider Triage Note (Signed)
Emergency Medicine Provider Triage Evaluation Note  Robin Davis , a 41 y.o. female  was evaluated in triage.  Pt complains of URI symptoms since Sunday.  Had a cough and waxing waning fever, which has now improved.  No complaints of body aches.  Review of Systems  Positive: As above Negative: As above  Physical Exam  BP (!) 154/101 (BP Location: Left Arm)   Pulse (!) 113   Temp 98.4 F (36.9 C) (Oral)   Resp 18   Wt 105.7 kg   SpO2 93%   BMI 42.62 kg/m  Gen:   Awake, no distress   Resp:  Normal effort  MSK:   Moves extremities without difficulty  Other:  Lung sounds clear  Medical Decision Making  Medically screening exam initiated at 10:09 AM.  Appropriate orders placed.  Robin Davis was informed that the remainder of the evaluation will be completed by another provider, this initial triage assessment does not replace that evaluation, and the importance of remaining in the ED until their evaluation is complete.     Mare Ferrari, PA-C 04/30/22 1010

## 2022-04-30 NOTE — ED Triage Notes (Signed)
Sunday developed cough, nonproductive, fever and now has body aches

## 2022-04-30 NOTE — Discharge Instructions (Addendum)
You were evaluated in the Emergency Department and after careful evaluation, we did not find any emergent condition requiring admission or further testing in the hospital.  Your flu test today was positive.  Please continue to drink plenty of fluids, take Tylenol or ibuprofen for fever/body aches, you can take over-the-counter cold and flu medicines.  Take the [facility] as needed.  Please make sure that you quarantine from other people until at least 24 hours fever free.  Please return to the Emergency Department if you experience any worsening of your condition.  We encourage you to follow up with a primary care provider.  Thank you for allowing [place] to be a part of your care.

## 2022-04-30 NOTE — ED Provider Notes (Signed)
[facility]-EMERGENCY DEPT Provider Note   CSN: [id number] Arrival date & time: 04/30/22  0920     History  Chief Complaint  Patient presents with   Generalized Body Aches   Cough   Fever    Robin Davis is a 41 y.o. female.  HPI 41 year old female who presents to the ER with URI symptoms since Sunday.  Reports significant body aches.  Has been taking Tylenol for her symptoms with little relief.  Has had a cough and waxing waning fever but this is improved now.  No other sick contacts.  No chest pain, shortness of breath.    Home Medications Prior to Admission medications   Medication Sig Start Date End Date Taking? Authorizing Provider  acetaminophen (TYLENOL) 650 MG CR tablet Take 1,300 mg by mouth 2 (two) times daily as needed for pain.    [provider]  amLODipine (NORVASC) 10 MG tablet Take 10 mg by mouth at bedtime. 12/28/19   [provider]  azithromycin (ZITHROMAX) 250 MG tablet Take 1 tab po daily x 4 more days, zero refills 04/01/22   Jerald Kief, MD  benzonatate (TESSALON) 100 MG capsule Take 1 capsule (100 mg total) by mouth every 8 (eight) hours. 04/30/22   Mare Ferrari, PA-C  budesonide-formoterol (SYMBICORT) 80-4.5 MCG/ACT inhaler Inhale 2 puffs into the lungs in the morning and at bedtime. Patient taking differently: Inhale 2 puffs into the lungs 2 (two) times daily as needed (cough). 03/19/22   Raspet, Noberto Retort, PA-C  Cholecalciferol (VITAMIN D-3 PO) Take 1 capsule by mouth in the morning. Vitamin D-3, unknown strength.    [provider]  hydrochlorothiazide (HYDRODIURIL) 12.5 MG tablet Take 12.5 mg by mouth in the morning. 08/01/21   [provider]  insulin aspart (NOVOLOG) 100 UNIT/ML FlexPen Inject 4 Units into the skin 3 (three) times daily with meals. 03/31/22   Jerald Kief, MD  Insulin Glargine [facility]) 100 UNIT/ML Inject 12 Units into the skin daily. 03/31/22   Jerald Kief,  MD  levonorgestrel (MIRENA) 20 MCG/24HR IUD 1 each by Intrauterine route once. Implanted October 2014    [provider]  losartan (COZAAR) 100 MG tablet Take 1 tablet (100 mg total) by mouth daily. 01/29/20   Drema Dallas, MD  metoprolol tartrate (LOPRESSOR) 25 MG tablet Take 0.5 tablets (12.5 mg total) by mouth 2 (two) times daily. 03/31/22 04/30/22  Jerald Kief, MD  POTASSIUM GLUCONATE PO Take 1 tablet by mouth in the morning. OTC potassium supplement, unknown strength.    [provider]      Allergies    Flagyl [metronidazole hcl], Macrobid [nitrofurantoin macrocrystal], Ultram [tramadol], Zestril [lisinopril], and Norco [hydrocodone-acetaminophen]    Review of Systems   Review of Systems Ten systems reviewed and are negative for acute change, except as noted in the HPI.   Physical Exam Updated Vital Signs BP (!) 154/101 (BP Location: Left Arm)   Pulse (!) 113   Temp 98.4 F (36.9 C) (Oral)   Resp 18   Wt 105.7 kg   SpO2 93%   BMI 42.62 kg/m  Physical Exam Vitals and nursing note reviewed.  Constitutional:      General: She is not in acute distress.    Appearance: She is well-developed.  HENT:     Head: Normocephalic and atraumatic.  Eyes:     Conjunctiva/sclera: Conjunctivae normal.  Cardiovascular:     Rate and Rhythm: Normal rate and regular  rhythm.     Heart sounds: No murmur heard. Pulmonary:     Effort: Pulmonary effort is normal. No respiratory distress.     Breath sounds: Normal breath sounds.  Abdominal:     Palpations: Abdomen is soft.     Tenderness: There is no abdominal tenderness.  Musculoskeletal:        General: No swelling.     Cervical back: Neck supple.  Skin:    General: Skin is warm and dry.     Capillary Refill: Capillary refill takes less than 2 seconds.  Neurological:     Mental Status: She is alert.  Psychiatric:        Mood and Affect: Mood normal.     ED Results / Procedures / Treatments   Labs (all labs  ordered are listed, but only abnormal results are displayed) Labs Reviewed  RESP PANEL BY RT-PCR (RSV, FLU A&B, COVID)  [id number] - Abnormal; Notable for the following components:      Result Value   Influenza A by PCR POSITIVE (*)    All other components within normal limits    EKG None  Radiology No results found.  Procedures Procedures    Medications Ordered in ED Medications - No data to display  ED Course/ Medical Decision Making/ A&P                           Medical Decision Making  41 year old female presenting with URI symptoms since Sunday.  Flu a positive.  No evidence of hypoxia.  Mildly tachycardic but suspect this is secondary to viral illness.  She is otherwise well-appearing.  She is requesting Tessalon Perles for cough which I prescribed.  We discussed return precautions.  She was understanding is agreeable.  Stable for discharge. Final Clinical Impression(s) / ED Diagnoses Final diagnoses:  Influenza A    Rx / DC Orders ED Discharge Orders          Ordered    benzonatate (TESSALON) 100 MG capsule  Every 8 hours        12 /20/23 1206              01-19-2003 04/30/22 1207    1208, MD 05/01/22 [phone]

## 2022-09-15 ENCOUNTER — Ambulatory Visit (HOSPITAL_COMMUNITY)
Admission: EM | Admit: 2022-09-15 | Discharge: 2022-09-15 | Disposition: A | Discharge status: Home / Self Care | Payer: Medicaid Other | Attending: Physician Assistant | Admitting: Physician Assistant

## 2022-09-15 ENCOUNTER — Encounter (HOSPITAL_COMMUNITY): Payer: Self-pay

## 2022-09-15 DIAGNOSIS — J029 Acute pharyngitis, unspecified: Secondary | ICD-10-CM | POA: Diagnosis not present

## 2022-09-15 DIAGNOSIS — H6690 Otitis media, unspecified, unspecified ear: Secondary | ICD-10-CM | POA: Diagnosis not present

## 2022-09-15 MED ORDER — AMOXICILLIN 500 MG PO CAPS: 500.0000 mg | ORAL_CAPSULE | Freq: Three times a day (TID) | ORAL | 0 refills | Status: DC

## 2022-09-15 NOTE — ED Triage Notes (Signed)
Here for sore throat and R-side ear pain that started this morning.

## 2022-09-15 NOTE — Discharge Instructions (Signed)
Return if any problems.  See your Physician for recheck  °

## 2022-09-15 NOTE — ED Provider Notes (Signed)
[facility]    CSN: [id number] Arrival date & time: 09/15/22  1191      History   Chief Complaint Chief Complaint  Patient presents with   Sore Throat   Otalgia    HPI Robin Davis is a 42 y.o. female.   Pt complains of a right earache and a sore throat.  Pt reports she gets frequent strep throat   The history is provided by the patient. No language interpreter was used.  Sore Throat This is a new problem. The problem occurs constantly. The problem has not changed since onset.Nothing aggravates the symptoms. Nothing relieves the symptoms. She has tried nothing for the symptoms.  Otalgia   Past Medical History:  Diagnosis Date   Anxiety    Asthma    COVID-19    Diabetes mellitus without complication (HCC)    Foot pain    Hypertension    Obesity     Patient Active Problem List   Diagnosis Date Noted   Acute asthma exacerbation 03/29/2022   Atypical chest pain 03/29/2022   SIRS (systemic inflammatory response syndrome) (HCC) 03/29/2022   GAD (generalized anxiety disorder) 03/29/2022   DM2 (diabetes mellitus, type 2) (HCC) 03/29/2022   Diverticulitis 10/15/2021   Encounter for gynecological examination without abnormal finding 10/15/2021   IUD (intrauterine device) in place 10/15/2021   Sinus tachycardia 01/27/2020   Essential hypertension 01/27/2020   Pneumonia due to COVID-19 virus 01/24/2020   Depression 01/24/2020   Hypokalemia 01/24/2020   Acute respiratory failure with hypoxia (HCC) 01/24/2020   Snoring 02/22/2019    Past Surgical History:  Procedure Laterality Date   BREAST REDUCTION SURGERY     CHOLECYSTECTOMY     REDUCTION MAMMAPLASTY  2006    OB History     Gravida  5   Para  2   Term  2   Preterm  0   AB  2   Living  2      SAB  0   IAB  2   Ectopic  0   Multiple  0   Live Births  2            Home Medications    Prior to Admission medications   Medication Sig Start Date End Date Taking?  Authorizing Provider  amLODipine (NORVASC) 10 MG tablet Take 10 mg by mouth at bedtime. 12/28/19  Yes [provider]  amoxicillin (AMOXIL) 500 MG capsule Take 1 capsule (500 mg total) by mouth 3 (three) times daily. 09/15/22  Yes Cheron Schaumann K, PA-C  insulin aspart (NOVOLOG) 100 UNIT/ML FlexPen Inject 4 Units into the skin 3 (three) times daily with meals. 03/31/22  Yes Jerald Kief, MD  Insulin Glargine [facility]) 100 UNIT/ML Inject 12 Units into the skin daily. 03/31/22  Yes Jerald Kief, MD  levonorgestrel (MIRENA) 20 MCG/24HR IUD 1 each by Intrauterine route once. Implanted October 2014   Yes [provider]  losartan (COZAAR) 100 MG tablet Take 1 tablet (100 mg total) by mouth daily. 01/29/20  Yes Drema Dallas, MD  POTASSIUM GLUCONATE PO Take 1 tablet by mouth in the morning. OTC potassium supplement, unknown strength.   Yes [provider]  acetaminophen (TYLENOL) 650 MG CR tablet Take 1,300 mg by mouth 2 (two) times daily as needed for pain.    [provider]  azithromycin (ZITHROMAX) 250 MG tablet Take 1 tab po daily x 4 more days, zero refills 04/01/22   Rhona Leavens,  Scheryl Marten, MD  benzonatate (TESSALON) 100 MG capsule Take 1 capsule (100 mg total) by mouth every 8 (eight) hours. 04/30/22   Mare Ferrari, PA-C  budesonide-formoterol (SYMBICORT) 80-4.5 MCG/ACT inhaler Inhale 2 puffs into the lungs in the morning and at bedtime. Patient taking differently: Inhale 2 puffs into the lungs 2 (two) times daily as needed (cough). 03/19/22   Raspet, Noberto Retort, PA-C  Cholecalciferol (VITAMIN D-3 PO) Take 1 capsule by mouth in the morning. Vitamin D-3, unknown strength.    [provider]  hydrochlorothiazide (HYDRODIURIL) 12.5 MG tablet Take 12.5 mg by mouth in the morning. 08/01/21   [provider]  metoprolol tartrate (LOPRESSOR) 25 MG tablet Take 0.5 tablets (12.5 mg total) by mouth 2 (two) times daily. 03/31/22 04/30/22  Jerald Kief,  MD    Family History Family History  Problem Relation Age of Onset   Hypertension Other    Diabetes Other     Social History Social History   Tobacco Use   Smoking status: Never    Passive exposure: Never   Smokeless tobacco: Never  Vaping Use   Vaping Use: Never used  Substance Use Topics   Alcohol use: Yes    Comment: seldom   Drug use: No     Allergies   Flagyl [metronidazole hcl], Macrobid [nitrofurantoin macrocrystal], Ultram [tramadol], Zestril [lisinopril], and Norco [hydrocodone-acetaminophen]   Review of Systems Review of Systems  HENT:  Positive for ear pain.   All other systems reviewed and are negative.    Physical Exam Triage Vital Signs ED Triage Vitals [09/15/22 0915]  Enc Vitals Group     BP (!) 141/89     Pulse Rate 93     Resp 18     Temp 98.5 F (36.9 C)     Temp Source Oral     SpO2 94 %     Weight      Height      Head Circumference      Peak Flow      Pain Score      Pain Loc      Pain Edu?      Excl. in GC?    No data found.  Updated Vital Signs BP (!) 141/89 (BP Location: Left Arm)   Pulse 93   Temp 98.5 F (36.9 C) (Oral)   Resp 18   SpO2 94%   Visual Acuity Right Eye Distance:   Left Eye Distance:   Bilateral Distance:    Right Eye Near:   Left Eye Near:    Bilateral Near:     Physical Exam Vitals reviewed.  Constitutional:      Appearance: She is well-developed.  HENT:     Right Ear: Swelling and tenderness present.     Left Ear: Tympanic membrane normal.     Ears:     Comments: Right ear canal swollen,  tender    Mouth/Throat:     Mouth: Mucous membranes are moist.  Eyes:     Conjunctiva/sclera: Conjunctivae normal.  Cardiovascular:     Rate and Rhythm: Normal rate.  Abdominal:     Palpations: Abdomen is soft.  Musculoskeletal:     Cervical back: Normal range of motion.  Skin:    General: Skin is warm.  Neurological:     Mental Status: She is alert.  Psychiatric:        Mood and Affect:  Mood normal.      UC Treatments / Results  Labs (all labs ordered are listed, but only abnormal results are displayed) Labs Reviewed - No data to display  EKG   Radiology No results found.  Procedures Procedures (including critical care time)  Medications Ordered in UC Medications - No data to display  Initial Impression / Assessment and Plan / UC Course  I have reviewed the triage vital signs and the nursing notes.  Pertinent labs & imaging results that were available during my care of the patient were reviewed by me and considered in my medical decision making (see chart for details).      Final Clinical Impressions(s) / UC Diagnoses   Final diagnoses:  Acute otitis media, unspecified otitis media type  Acute pharyngitis, unspecified etiology     Discharge Instructions      Return if any problems.  See your Physician for recheck.     ED Prescriptions     Medication Sig Dispense Auth. Provider   amoxicillin (AMOXIL) 500 MG capsule Take 1 capsule (500 mg total) by mouth 3 (three) times daily. 30 capsule Elson Areas, [place]      PDMP not reviewed this encounter. An After Visit Summary was printed and given to the patient.       Elson Areas, [place] 09/15/22 1008

## 2022-11-10 ENCOUNTER — Ambulatory Visit: Payer: Medicaid Other | Admitting: Podiatry

## 2022-11-18 ENCOUNTER — Encounter (HOSPITAL_COMMUNITY): Payer: Self-pay

## 2022-11-18 ENCOUNTER — Ambulatory Visit (HOSPITAL_COMMUNITY)
Admission: EM | Admit: 2022-11-18 | Discharge: 2022-11-18 | Disposition: A | Discharge status: Home / Self Care | Payer: Medicaid Other | Attending: Emergency Medicine | Admitting: Emergency Medicine

## 2022-11-18 DIAGNOSIS — J4541 Moderate persistent asthma with (acute) exacerbation: Secondary | ICD-10-CM | POA: Diagnosis present

## 2022-11-18 DIAGNOSIS — J029 Acute pharyngitis, unspecified: Secondary | ICD-10-CM | POA: Diagnosis present

## 2022-11-18 DIAGNOSIS — J302 Other seasonal allergic rhinitis: Secondary | ICD-10-CM | POA: Diagnosis present

## 2022-11-18 DIAGNOSIS — R0982 Postnasal drip: Secondary | ICD-10-CM | POA: Diagnosis present

## 2022-11-18 LAB — POCT RAPID STREP A (OFFICE): Rapid Strep A Screen: NEGATIVE

## 2022-11-18 MED ORDER — ALBUTEROL SULFATE HFA 108 (90 BASE) MCG/ACT IN AERS: 2.0000 | INHALATION_SPRAY | Freq: Four times a day (QID) | RESPIRATORY_TRACT | 2 refills | Status: AC | PRN

## 2022-11-18 MED ORDER — CETIRIZINE HCL 10 MG PO TABS: 10.0000 mg | ORAL_TABLET | Freq: Every day | ORAL | 1 refills | Status: DC

## 2022-11-18 MED ORDER — ALBUTEROL SULFATE 0.63 MG/3ML IN NEBU: 1.0000 | INHALATION_SOLUTION | Freq: Four times a day (QID) | RESPIRATORY_TRACT | 0 refills | Status: AC | PRN

## 2022-11-18 MED ORDER — FLUTICASONE PROPIONATE 50 MCG/ACT NA SUSP: 1.0000 | Freq: Every day | NASAL | 2 refills | Status: DC

## 2022-11-18 MED ORDER — ALBUTEROL SULFATE (2.5 MG/3ML) 0.083% IN NEBU
INHALATION_SOLUTION | RESPIRATORY_TRACT | Status: AC
  Filled 2022-11-18: qty 3

## 2022-11-18 MED ORDER — BUDESONIDE-FORMOTEROL FUMARATE 160-4.5 MCG/ACT IN AERO: 2.0000 | INHALATION_SPRAY | Freq: Two times a day (BID) | RESPIRATORY_TRACT | 0 refills | Status: AC

## 2022-11-18 MED ORDER — ALBUTEROL SULFATE (2.5 MG/3ML) 0.083% IN NEBU
2.5000 mg | INHALATION_SOLUTION | Freq: Once | RESPIRATORY_TRACT | Status: AC
  Administered 2022-11-18: 2.5 mg via RESPIRATORY_TRACT

## 2022-11-18 NOTE — Discharge Instructions (Addendum)
Your strep test today is negative.  Streptococcal throat culture will be performed per our protocol.  The result of your throat culture will be posted to your MyChart once it is complete, this typically takes 3 to 5 days.  If your streptococcal throat culture is positive, you will be contacted by phone and antibiotics will prescribed for you.   Please read below to learn more about the medications, dosages and frequencies that I recommend to help alleviate your symptoms and to get you feeling better soon:   Zyrtec (cetirizine): This is an excellent second-generation antihistamine that helps to reduce respiratory inflammatory response to environmental allergens.  In some patients, this medication can cause daytime sleepiness so I recommend that you take 1 tablet daily at bedtime.     Flonase (fluticasone): This is a steroid nasal spray that used once daily, 1 spray in each nare.  This works best when used on a daily basis. This medication does not work well if it is only used when you think you need it.  After 3 to 5 days of use, you will notice significant reduction of the inflammation and mucus production that is currently being caused by exposure to allergens, whether seasonal or environmental.  The most common side effect of this medication is nosebleeds.  If you experience a nosebleed, please discontinue use for 1 week, then feel free to resume.  If you find that your insurance will not pay for this medication, please consider a different nasal steroids such as Nasonex (mometasone), or Nasacort (triamcinolone).   ProAir, Ventolin, Proventil (albuterol): This inhaled medication contains a short acting beta agonist bronchodilator.  This medication relaxes the smooth muscle of the airway in the lungs.  When these muscles are tight, breathing becomes more constricted.  The result of relaxation of the smooth muscle is increased air movement and improved work of breathing.  This is a short acting medication  that can be used every 4-6 hours as needed for increased work of breathing, shortness of breath, wheezing and excessive coughing.  It comes in the form of a handheld inhaler or nebulizer solution.  I recommended that for the next 3 to 4 days, this medication is used 4 times daily on a scheduled basis then decrease to twice daily and as needed until symptoms have completely resolved which I anticipate will be several weeks.   Symbicort (budesonide and formoterol): Please inhale 2 puffs twice daily.  This inhaled medication contains a corticosteroid and long-acting form of albuterol.  The inhaled steroid and this medication  is not absorbed into the body and will not cause side effects such as increased blood sugar levels, irritability, sleeplessness or weight gain.  Inhaled corticosteroid are sort of like topical steroid creams but, as you can imagine, it is not practical to attempt to rub a steroid cream inside of your lungs.  The long-acting albuterol works similarly to the short acting albuterol found in your rescue inhaler but provides 24-hour relaxation of the smooth muscles that open and constrict your airways; your short acting rescue inhaler can only provide for a few hours this benefit for a few hours.  Please feel free to continue using your short acting rescue inhaler as often as needed throughout the day for shortness of breath, wheezing, and cough.   If symptoms have not meaningfully improved in the next 5 to 7 days, please return for repeat evaluation or follow-up with your regular provider.  If symptoms have worsened in the next 3 to  5 days, please return for repeat evaluation or follow-up with your regular provider.    Thank you for visiting urgent care today.  We appreciate the opportunity to participate in your care.

## 2022-11-18 NOTE — ED Triage Notes (Signed)
Patient here today with c/o scratchy throat that started this morning after waking up from using her CPAP machine. She states that she hadn't been using it. Patient states that she feels fine but just has a scratchy throat. She states that she could have some PND but not coughing anything up. No congestion.

## 2022-11-18 NOTE — ED Provider Notes (Signed)
[facility]    CSN: [id number] Arrival date & time: 11/18/22  1738    HISTORY   Chief Complaint  Patient presents with   Sore Throat   HPI Robin Davis is a pleasant, 41 y.o. female who presents to urgent care today. Patient here today with c/o scratchy throat that started this morning after waking up from using her CPAP machine which she only uses infrequently when she wants to get a good night sleep.  Patient states she used to regular tap water in her CPAP machine.  Patient states her husband was also spray painting yesterday and she inhaled some of the fumes. Patient states that she feels fine otherwise, just has a scratchy throat. She states that she could have strep because she gets that a lot. No nasal congestion, endorses thick post nasal drip and nonproductive cough.  Patient reports a history of allergies and asthma, not currently taking any allergy medication and only uses Symbicort as needed for cough and shortness of breath.   The history is provided by the patient.   Past Medical History:  Diagnosis Date   Anxiety    Asthma    COVID-19    Diabetes mellitus without complication (HCC)    Foot pain    Hypertension    Obesity    Patient Active Problem List   Diagnosis Date Noted   Acute asthma exacerbation 03/29/2022   Atypical chest pain 03/29/2022   SIRS (systemic inflammatory response syndrome) (HCC) 03/29/2022   GAD (generalized anxiety disorder) 03/29/2022   DM2 (diabetes mellitus, type 2) (HCC) 03/29/2022   Diverticulitis 10/15/2021   Encounter for gynecological examination without abnormal finding 10/15/2021   IUD (intrauterine device) in place 10/15/2021   Sinus tachycardia 01/27/2020   Essential hypertension 01/27/2020   Pneumonia due to COVID-19 virus 01/24/2020   Depression 01/24/2020   Hypokalemia 01/24/2020   Acute respiratory failure with hypoxia (HCC) 01/24/2020   Snoring 02/22/2019   Past Surgical History:  Procedure  Laterality Date   BREAST REDUCTION SURGERY     CHOLECYSTECTOMY     REDUCTION MAMMAPLASTY  2006   OB History     Gravida  5   Para  2   Term  2   Preterm  0   AB  2   Living  2      SAB  0   IAB  2   Ectopic  0   Multiple  0   Live Births  2          Home Medications    Prior to Admission medications   Medication Sig Start Date End Date Taking? Authorizing Provider  acetaminophen (TYLENOL) 650 MG CR tablet Take 1,300 mg by mouth 2 (two) times daily as needed for pain.   Yes [provider]  amLODipine (NORVASC) 10 MG tablet Take 10 mg by mouth at bedtime. 12/28/19  Yes [provider]  budesonide-formoterol (SYMBICORT) 80-4.5 MCG/ACT inhaler Inhale 2 puffs into the lungs in the morning and at bedtime. Patient taking differently: Inhale 2 puffs into the lungs 2 (two) times daily as needed (cough). 03/19/22  Yes Raspet, Noberto Retort, PA-C  hydrochlorothiazide (HYDRODIURIL) 12.5 MG tablet Take 12.5 mg by mouth in the morning. 08/01/21  Yes [provider]  losartan (COZAAR) 100 MG tablet Take 1 tablet (100 mg total) by mouth daily. 01/29/20  Yes Drema Dallas, MD  POTASSIUM GLUCONATE PO Take 1 tablet by mouth in the morning. OTC potassium supplement, unknown  strength.   Yes [provider]  Cholecalciferol (VITAMIN D-3 PO) Take 1 capsule by mouth in the morning. Vitamin D-3, unknown strength.    [provider]  insulin aspart (NOVOLOG) 100 UNIT/ML FlexPen Inject 4 Units into the skin 3 (three) times daily with meals. 03/31/22   Jerald Kief, MD  Insulin Glargine [facility]) 100 UNIT/ML Inject 12 Units into the skin daily. 03/31/22   Jerald Kief, MD  levonorgestrel (MIRENA) 20 MCG/24HR IUD 1 each by Intrauterine route once. Implanted October 2014    [provider]  metoprolol tartrate (LOPRESSOR) 25 MG tablet Take 0.5 tablets (12.5 mg total) by mouth 2 (two) times daily. 03/31/22 04/30/22  Jerald Kief, MD     Family History Family History  Problem Relation Age of Onset   Hypertension Other    Diabetes Other    Social History Social History   Tobacco Use   Smoking status: Never    Passive exposure: Never   Smokeless tobacco: Never  Vaping Use   Vaping Use: Never used  Substance Use Topics   Alcohol use: Yes    Comment: seldom   Drug use: No   Allergies   Macrobid [nitrofurantoin macrocrystal], Ultram [tramadol], Zestril [lisinopril], and Norco [hydrocodone-acetaminophen]  Review of Systems Review of Systems Pertinent findings revealed after performing a 14 point review of systems has been noted in the history of present illness.  Physical Exam Vital Signs BP 126/86 (BP Location: Left Arm)   Pulse 97   Temp 98.7 F (37.1 C) (Oral)   Resp 16   Ht 5\' 2"  (1.575 m)   Wt 236 lb (107 kg)   LMP 08/09/2022 (Approximate)   SpO2 95%   BMI 43.16 kg/m   No data found.  Physical Exam Vitals and nursing note reviewed.  Constitutional:      General: She is not in acute distress.    Appearance: Normal appearance. She is not ill-appearing.  HENT:     Head: Normocephalic and atraumatic.     Salivary Glands: Right salivary gland is not diffusely enlarged or tender. Left salivary gland is not diffusely enlarged or tender.     Right Ear: Ear canal and external ear normal. No drainage. A middle ear effusion is present. There is no impacted cerumen. Tympanic membrane is bulging. Tympanic membrane is not injected or erythematous.     Left Ear: Ear canal and external ear normal. No drainage. A middle ear effusion is present. There is no impacted cerumen. Tympanic membrane is bulging. Tympanic membrane is not injected or erythematous.     Ears:     Comments: Bilateral EACs normal, both TMs bulging with clear fluid    Nose: Rhinorrhea present. No nasal deformity, septal deviation, signs of injury, nasal tenderness, mucosal edema or congestion. Rhinorrhea is clear.     Right Nostril:  Occlusion present. No foreign body, epistaxis or septal hematoma.     Left Nostril: Occlusion present. No foreign body, epistaxis or septal hematoma.     Right Turbinates: Enlarged, swollen and pale.     Left Turbinates: Enlarged, swollen and pale.     Right Sinus: No maxillary sinus tenderness or frontal sinus tenderness.     Left Sinus: No maxillary sinus tenderness or frontal sinus tenderness.     Mouth/Throat:     Lips: Pink. No lesions.     Mouth: Mucous membranes are moist. No oral lesions.     Pharynx: Oropharynx is clear. Uvula midline. No posterior  oropharyngeal erythema or uvula swelling.     Tonsils: No tonsillar exudate. 0 on the right. 0 on the left.     Comments: Postnasal drip Eyes:     General: Lids are normal.        Right eye: No discharge.        Left eye: No discharge.     Extraocular Movements: Extraocular movements intact.     Conjunctiva/sclera: Conjunctivae normal.     Right eye: Right conjunctiva is not injected.     Left eye: Left conjunctiva is not injected.  Neck:     Trachea: Trachea and phonation normal.  Cardiovascular:     Rate and Rhythm: Normal rate and regular rhythm.     Pulses: Normal pulses.     Heart sounds: Normal heart sounds. No murmur heard.    No friction rub. No gallop.  Pulmonary:     Effort: Pulmonary effort is normal. No tachypnea, bradypnea, accessory muscle usage, prolonged expiration or respiratory distress.     Breath sounds: No stridor, decreased air movement or transmitted upper airway sounds. Examination of the right-upper field reveals decreased breath sounds. Examination of the left-upper field reveals decreased breath sounds. Examination of the right-middle field reveals decreased breath sounds. Examination of the left-middle field reveals decreased breath sounds. Examination of the right-lower field reveals decreased breath sounds. Examination of the left-lower field reveals decreased breath sounds. Decreased breath sounds  present. No wheezing, rhonchi or rales.  Chest:     Chest wall: No tenderness.  Musculoskeletal:        General: Normal range of motion.     Cervical back: Normal range of motion and neck supple. Normal range of motion.  Lymphadenopathy:     Cervical: No cervical adenopathy.  Skin:    General: Skin is warm and dry.     Findings: No erythema or rash.  Neurological:     General: No focal deficit present.     Mental Status: She is alert and oriented to person, place, and time.  Psychiatric:        Mood and Affect: Mood normal.        Behavior: Behavior normal.     Visual Acuity Right Eye Distance:   Left Eye Distance:   Bilateral Distance:    Right Eye Near:   Left Eye Near:    Bilateral Near:     UC Couse / Diagnostics / Procedures:     Radiology No results found.  Procedures Procedures (including critical care time) EKG  Pending results:  Labs Reviewed  CULTURE, GROUP A STREP [facility])  POCT RAPID STREP A (OFFICE)    Medications Ordered in UC: Medications  albuterol (PROVENTIL) (2.5 MG/3ML) 0.083% nebulizer solution 2.5 mg (2.5 mg Nebulization Given 11/18/22 1928)    UC Diagnoses / Final Clinical Impressions(s)   I have reviewed the triage vital signs and the nursing notes.  Pertinent labs & imaging results that were available during my care of the patient were reviewed by me and considered in my medical decision making (see chart for details).    Final diagnoses:  Seasonal allergic rhinitis, unspecified trigger  Postnasal drip  Moderate persistent asthma with acute exacerbation  Pharyngitis, unspecified etiology   Improved work of breathing  ***  Please see discharge instructions below for details of plan of care as provided to patient. ED Prescriptions     Medication Sig Dispense Auth. Provider   budesonide-formoterol (SYMBICORT) 160-4.5 MCG/ACT inhaler Inhale 2 puffs into the lungs  every 12 (twelve) hours. 3 each Theadora Rama Scales, PA-C    albuterol (ACCUNEB) 0.63 MG/3ML nebulizer solution Take 3 mLs (0.63 mg total) by nebulization every 6 (six) hours as needed for wheezing or shortness of breath. 1,080 mL Theadora Rama Scales, PA-C   albuterol (VENTOLIN HFA) 108 (90 Base) MCG/ACT inhaler Inhale 2 puffs into the lungs every 6 (six) hours as needed for wheezing or shortness of breath (Cough). 18 g Theadora Rama Scales, PA-C   cetirizine (ZYRTEC ALLERGY) 10 MG tablet Take 1 tablet (10 mg total) by mouth at bedtime. 90 tablet Theadora Rama Scales, PA-C   fluticasone (FLONASE) 50 MCG/ACT nasal spray Place 1 spray into both nostrils daily. Begin by using 2 sprays in each nare daily for 3 to 5 days, then decrease to 1 spray in each nare daily. 15.8 mL Theadora Rama Scales, PA-C      PDMP not reviewed this encounter.  Pending results:  Labs Reviewed  CULTURE, GROUP A STREP [facility])  POCT RAPID STREP A (OFFICE)    Discharge Instructions:   Discharge Instructions      Your strep test today is negative.  Streptococcal throat culture will be performed per our protocol.  The result of your throat culture will be posted to your MyChart once it is complete, this typically takes 3 to 5 days.  If your streptococcal throat culture is positive, you will be contacted by phone and antibiotics will prescribed for you.   Please read below to learn more about the medications, dosages and frequencies that I recommend to help alleviate your symptoms and to get you feeling better soon:   Zyrtec (cetirizine): This is an excellent second-generation antihistamine that helps to reduce respiratory inflammatory response to environmental allergens.  In some patients, this medication can cause daytime sleepiness so I recommend that you take 1 tablet daily at bedtime.     Flonase (fluticasone): This is a steroid nasal spray that used once daily, 1 spray in each nare.  This works best when used on a daily basis. This medication does not work well if it  is only used when you think you need it.  After 3 to 5 days of use, you will notice significant reduction of the inflammation and mucus production that is currently being caused by exposure to allergens, whether seasonal or environmental.  The most common side effect of this medication is nosebleeds.  If you experience a nosebleed, please discontinue use for 1 week, then feel free to resume.  If you find that your insurance will not pay for this medication, please consider a different nasal steroids such as Nasonex (mometasone), or Nasacort (triamcinolone).   ProAir, Ventolin, Proventil (albuterol): This inhaled medication contains a short acting beta agonist bronchodilator.  This medication relaxes the smooth muscle of the airway in the lungs.  When these muscles are tight, breathing becomes more constricted.  The result of relaxation of the smooth muscle is increased air movement and improved work of breathing.  This is a short acting medication that can be used every 4-6 hours as needed for increased work of breathing, shortness of breath, wheezing and excessive coughing.  It comes in the form of a handheld inhaler or nebulizer solution.  I recommended that for the next 3 to 4 days, this medication is used 4 times daily on a scheduled basis then decrease to twice daily and as needed until symptoms have completely resolved which I anticipate will be several weeks.   Symbicort (budesonide and  formoterol): Please inhale 2 puffs twice daily.  This inhaled medication contains a corticosteroid and long-acting form of albuterol.  The inhaled steroid and this medication  is not absorbed into the body and will not cause side effects such as increased blood sugar levels, irritability, sleeplessness or weight gain.  Inhaled corticosteroid are sort of like topical steroid creams but, as you can imagine, it is not practical to attempt to rub a steroid cream inside of your lungs.  The long-acting albuterol works similarly  to the short acting albuterol found in your rescue inhaler but provides 24-hour relaxation of the smooth muscles that open and constrict your airways; your short acting rescue inhaler can only provide for a few hours this benefit for a few hours.  Please feel free to continue using your short acting rescue inhaler as often as needed throughout the day for shortness of breath, wheezing, and cough.   If symptoms have not meaningfully improved in the next 5 to 7 days, please return for repeat evaluation or follow-up with your regular provider.  If symptoms have worsened in the next 3 to 5 days, please return for repeat evaluation or follow-up with your regular provider.    Thank you for visiting urgent care today.  We appreciate the opportunity to participate in your care.       Disposition Upon Discharge:  Condition: stable for discharge home  Patient presented with an acute illness with associated systemic symptoms and significant discomfort requiring urgent management. In my opinion, this is a condition that a prudent lay person (someone who possesses an average knowledge of health and medicine) may potentially expect to result in complications if not addressed urgently such as respiratory distress, impairment of bodily function or dysfunction of bodily organs.   Routine symptom specific, illness specific and/or disease specific instructions were discussed with the patient and/or caregiver at length.   As such, the patient has been evaluated and assessed, work-up was performed and treatment was provided in alignment with urgent care protocols and evidence based medicine.  Patient/parent/caregiver has been advised that the patient may require follow up for further testing and treatment if the symptoms continue in spite of treatment, as clinically indicated and appropriate.  Patient/parent/caregiver has been advised to return to the [facility] or PCP if no better; to PCP or the Emergency Department if new  signs and symptoms develop, or if the current signs or symptoms continue to change or worsen for further workup, evaluation and treatment as clinically indicated and appropriate  The patient will follow up with their current PCP if and as advised. If the patient does not currently have a PCP we will assist them in obtaining one.   The patient may need specialty follow up if the symptoms continue, in spite of conservative treatment and management, for further workup, evaluation, consultation and treatment as clinically indicated and appropriate.  Patient/parent/caregiver verbalized understanding and agreement of plan as discussed.  All questions were addressed during visit.  Please see discharge instructions below for further details of plan.  This office note has been dictated using Teaching laboratory technician.  Unfortunately, this method of dictation can sometimes lead to typographical or grammatical errors.  I apologize for your inconvenience in advance if this occurs.  Please do not hesitate to reach out to me if clarification is needed.

## 2022-11-21 LAB — CULTURE, GROUP A STREP (THRC)

## 2022-12-09 ENCOUNTER — Encounter: Payer: Self-pay | Admitting: Podiatry

## 2022-12-09 ENCOUNTER — Ambulatory Visit (INDEPENDENT_AMBULATORY_CARE_PROVIDER_SITE_OTHER): Payer: Medicaid Other

## 2022-12-09 ENCOUNTER — Ambulatory Visit (INDEPENDENT_AMBULATORY_CARE_PROVIDER_SITE_OTHER): Payer: Medicaid Other | Admitting: Podiatry

## 2022-12-09 DIAGNOSIS — M62462 Contracture of muscle, left lower leg: Secondary | ICD-10-CM

## 2022-12-09 DIAGNOSIS — M79672 Pain in left foot: Secondary | ICD-10-CM | POA: Diagnosis not present

## 2022-12-09 DIAGNOSIS — M79671 Pain in right foot: Secondary | ICD-10-CM | POA: Diagnosis not present

## 2022-12-09 DIAGNOSIS — M722 Plantar fascial fibromatosis: Secondary | ICD-10-CM | POA: Diagnosis not present

## 2022-12-09 DIAGNOSIS — M62461 Contracture of muscle, right lower leg: Secondary | ICD-10-CM

## 2022-12-09 MED ORDER — DICLOFENAC SODIUM 1 % EX GEL: 4.0000 g | Freq: Four times a day (QID) | CUTANEOUS | 4 refills | Status: AC

## 2022-12-09 NOTE — Progress Notes (Signed)
  Subjective:  Patient ID: Robin Davis, female    DOB: 1980-10-29,  MRN: [id number]  Chief Complaint  Patient presents with   Foot Pain    "The heel on my right foot is crazy.  It's been hurting and it's numb.  The left one is starting to hurt too." N - heel pain L - plantar bilateral D - 5-6 mos O - suddenly, gradually worse C - numbness, pinching and throbbing pain, rt > lt A - barefoot, feet hanging off the edge T - saw PCP    42 y.o. female presents with the above complaint. History confirmed with patient.  She describes the pain more so as a numbness around the outside of the foot  Objective:  Physical Exam: warm, good capillary refill, no trophic changes or ulcerative lesions, normal DP and PT pulses, normal sensory exam, and bilateral has some tenderness on the heel pad some reduced sensation on the plantar heel pad, minimal pain to compression or palpation.    Radiographs: Multiple views x-ray of both feet: no fracture, dislocation, swelling or degenerative changes noted and plantar calcaneal spur Assessment:   1. Plantar fasciitis, bilateral   2. Gastrocnemius equinus of left lower extremity   3. Gastrocnemius equinus of right lower extremity      Plan:  Patient was evaluated and treated and all questions answered.   Discussed the etiology and treatment options for plantar fasciitis including stretching, formal physical therapy, supportive shoegears such as a running shoe or sneaker, pre fabricated orthoses, injection therapy, and oral medications.  Most of her symptoms are relatively atypical possibly a lateral plantar nerve entrapment as well.  She does have well-controlled diabetes so certainly possibility of diabetic neuropathy as well.  I recommended stretching exercises we discussed shoe gear changes cushioning the heel and if not improving and injection.  Voltaren recommended as well and Rx sent to pharmacy for this.  Return as needed if it worsens or does  not improve within 4 to 6 weeks      Return if symptoms worsen or fail to improve.

## 2022-12-09 NOTE — Patient Instructions (Signed)
Look for Voltaren gel at the pharmacy over the counter or online (also known as diclofenac 1% gel). Apply to the painful areas 3-4x daily with the supplied dosing card. Allow to dry for 10 minutes before going into socks/shoes    The shoes we discussed are [employer] SR slip resistant shoes     Plantar Fasciitis (Heel Spur Syndrome) with Rehab The plantar fascia is a fibrous, ligament-like, soft-tissue structure that spans the bottom of the foot. Plantar fasciitis is a condition that causes pain in the foot due to inflammation of the tissue. SYMPTOMS  Pain and tenderness on the underneath side of the foot. Pain that worsens with standing or walking. CAUSES  Plantar fasciitis is caused by irritation and injury to the plantar fascia on the underneath side of the foot. Common mechanisms of injury include: Direct trauma to bottom of the foot. Damage to a small nerve that runs under the foot where the main fascia attaches to the heel bone. Stress placed on the plantar fascia due to bone spurs. RISK INCREASES WITH:  Activities that place stress on the plantar fascia (running, jumping, pivoting, or cutting). Poor strength and flexibility. Improperly fitted shoes. Tight calf muscles. Flat feet. Failure to warm-up properly before activity. Obesity. PREVENTION Warm up and stretch properly before activity. Allow for adequate recovery between workouts. Maintain physical fitness: Strength, flexibility, and endurance. Cardiovascular fitness. Maintain a health body weight. Avoid stress on the plantar fascia. Wear properly fitted shoes, including arch supports for individuals who have flat feet.  PROGNOSIS  If treated properly, then the symptoms of plantar fasciitis usually resolve without surgery. However, occasionally surgery is necessary.  RELATED COMPLICATIONS  Recurrent symptoms that may result in a chronic condition. Problems of the lower back that are caused by compensating for the  injury, such as limping. Pain or weakness of the foot during push-off following surgery. Chronic inflammation, scarring, and partial or complete fascia tear, occurring more often from repeated injections.  TREATMENT  Treatment initially involves the use of ice and medication to help reduce pain and inflammation. The use of strengthening and stretching exercises may help reduce pain with activity, especially stretches of the Achilles tendon. These exercises may be performed at home or with a therapist. Your caregiver may recommend that you use heel cups of arch supports to help reduce stress on the plantar fascia. Occasionally, corticosteroid injections are given to reduce inflammation. If symptoms persist for greater than 6 months despite non-surgical (conservative), then surgery may be recommended.   MEDICATION  If pain medication is necessary, then nonsteroidal anti-inflammatory medications, such as aspirin and ibuprofen, or other minor pain relievers, such as acetaminophen, are often recommended. Do not take pain medication within 7 days before surgery. Prescription pain relievers may be given if deemed necessary by your caregiver. Use only as directed and only as much as you need. Corticosteroid injections may be given by your caregiver. These injections should be reserved for the most serious cases, because they may only be given a certain number of times.  HEAT AND COLD Cold treatment (icing) relieves pain and reduces inflammation. Cold treatment should be applied for 10 to 15 minutes every 2 to 3 hours for inflammation and pain and immediately after any activity that aggravates your symptoms. Use ice packs or massage the area with a piece of ice (ice massage). Heat treatment may be used prior to performing the stretching and strengthening activities prescribed by your caregiver, physical therapist, or athletic trainer. Use a heat  pack or soak the injury in warm water.  SEEK IMMEDIATE MEDICAL  CARE IF: Treatment seems to offer no benefit, or the condition worsens. Any medications produce adverse side effects.  EXERCISES- RANGE OF MOTION (ROM) AND STRETCHING EXERCISES - Plantar Fasciitis (Heel Spur Syndrome) These exercises may help you when beginning to rehabilitate your injury. Your symptoms may resolve with or without further involvement from your physician, physical therapist or athletic trainer. While completing these exercises, remember:  Restoring tissue flexibility helps normal motion to return to the joints. This allows healthier, less painful movement and activity. An effective stretch should be held for at least 30 seconds. A stretch should never be painful. You should only feel a gentle lengthening or release in the stretched tissue.  RANGE OF MOTION - Toe Extension, Flexion Sit with your right / left leg crossed over your opposite knee. Grasp your toes and gently pull them back toward the top of your foot. You should feel a stretch on the bottom of your toes and/or foot. Hold this stretch for 10 seconds. Now, gently pull your toes toward the bottom of your foot. You should feel a stretch on the top of your toes and or foot. Hold this stretch for 10 seconds. Repeat  times. Complete this stretch 3 times per day.   RANGE OF MOTION - Ankle Dorsiflexion, Active Assisted Remove shoes and sit on a chair that is preferably not on a carpeted surface. Place right / left foot under knee. Extend your opposite leg for support. Keeping your heel down, slide your right / left foot back toward the chair until you feel a stretch at your ankle or calf. If you do not feel a stretch, slide your bottom forward to the edge of the chair, while still keeping your heel down. Hold this stretch for 10 seconds. Repeat 3 times. Complete this stretch 2 times per day.   STRETCH  Gastroc, Standing Place hands on wall. Extend right / left leg, keeping the front knee somewhat bent. Slightly point  your toes inward on your back foot. Keeping your right / left heel on the floor and your knee straight, shift your weight toward the wall, not allowing your back to arch. You should feel a gentle stretch in the right / left calf. Hold this position for 10 seconds. Repeat 3 times. Complete this stretch 2 times per day.  STRETCH  Soleus, Standing Place hands on wall. Extend right / left leg, keeping the other knee somewhat bent. Slightly point your toes inward on your back foot. Keep your right / left heel on the floor, bend your back knee, and slightly shift your weight over the back leg so that you feel a gentle stretch deep in your back calf. Hold this position for 10 seconds. Repeat 3 times. Complete this stretch 2 times per day.  STRETCH  Gastrocsoleus, Standing  Note: This exercise can place a lot of stress on your foot and ankle. Please complete this exercise only if specifically instructed by your caregiver.  Place the ball of your right / left foot on a step, keeping your other foot firmly on the same step. Hold on to the wall or a rail for balance. Slowly lift your other foot, allowing your body weight to press your heel down over the edge of the step. You should feel a stretch in your right / left calf. Hold this position for 10 seconds. Repeat this exercise with a slight bend in your right / left knee.  Repeat 3 times. Complete this stretch 2 times per day.   STRENGTHENING EXERCISES - Plantar Fasciitis (Heel Spur Syndrome)  These exercises may help you when beginning to rehabilitate your injury. They may resolve your symptoms with or without further involvement from your physician, physical therapist or athletic trainer. While completing these exercises, remember:  Muscles can gain both the endurance and the strength needed for everyday activities through controlled exercises. Complete these exercises as instructed by your physician, physical therapist or athletic trainer. Progress  the resistance and repetitions only as guided.  STRENGTH - Towel Curls Sit in a chair positioned on a non-carpeted surface. Place your foot on a towel, keeping your heel on the floor. Pull the towel toward your heel by only curling your toes. Keep your heel on the floor. Repeat 3 times. Complete this exercise 2 times per day.  STRENGTH - Ankle Inversion Secure one end of a rubber exercise band/tubing to a fixed object (table, pole). Loop the other end around your foot just before your toes. Place your fists between your knees. This will focus your strengthening at your ankle. Slowly, pull your big toe up and in, making sure the band/tubing is positioned to resist the entire motion. Hold this position for 10 seconds. Have your muscles resist the band/tubing as it slowly pulls your foot back to the starting position. Repeat 3 times. Complete this exercises 2 times per day.  Document Released: 04/28/2005 Document Revised: 07/21/2011 Document Reviewed: 08/10/2008 [facility] Patient Information 2014 [place], [place].

## 2023-04-19 ENCOUNTER — Other Ambulatory Visit: Payer: Self-pay

## 2023-04-19 DIAGNOSIS — Z794 Long term (current) use of insulin: Secondary | ICD-10-CM | POA: Insufficient documentation

## 2023-04-19 DIAGNOSIS — M436 Torticollis: Secondary | ICD-10-CM | POA: Diagnosis present

## 2023-04-19 NOTE — ED Triage Notes (Signed)
Pt to triage via self ambulation c/o neck pain 10/10 sharp in nature x 12 hours. PT denies injury, CP SOB or Fever.  Pt states pain worse upon movement. VSS NAD Pt on room air.

## 2023-04-20 ENCOUNTER — Emergency Department (HOSPITAL_BASED_OUTPATIENT_CLINIC_OR_DEPARTMENT_OTHER)
Admission: EM | Admit: 2023-04-20 | Discharge: 2023-04-20 | Disposition: A | Discharge status: Home / Self Care | Payer: Medicaid Other | Attending: Emergency Medicine | Admitting: Emergency Medicine

## 2023-04-20 DIAGNOSIS — M436 Torticollis: Secondary | ICD-10-CM

## 2023-04-20 MED ORDER — MELOXICAM 15 MG PO TABS: 15.0000 mg | ORAL_TABLET | Freq: Every day | ORAL | 0 refills | Status: AC

## 2023-04-20 MED ORDER — MIDAZOLAM HCL 2 MG/ML PO SYRP
2.0000 mg | ORAL_SOLUTION | Freq: Once | ORAL | Status: AC
  Administered 2023-04-20: 2 mg via ORAL
  Filled 2023-04-20: qty 5

## 2023-04-20 MED ORDER — KETOROLAC TROMETHAMINE 60 MG/2ML IM SOLN
30.0000 mg | Freq: Once | INTRAMUSCULAR | Status: AC
  Administered 2023-04-20: 30 mg via INTRAMUSCULAR
  Filled 2023-04-20: qty 2

## 2023-04-20 MED ORDER — CYCLOBENZAPRINE HCL 10 MG PO TABS: 10.0000 mg | ORAL_TABLET | Freq: Two times a day (BID) | ORAL | 0 refills | Status: AC | PRN

## 2023-04-20 NOTE — ED Provider Notes (Signed)
[place] EMERGENCY DEPARTMENT AT [facility] Provider Note   CSN: [id number] Arrival date & time: 04/19/23  2115     History  Chief Complaint  Patient presents with   Torticollis    Robin Davis is a 42 y.o. female.  Slept in an abormal position on the cough and realized she didn't want to turn her head to the right secondary to tightness in posterior shoulder, right neck and anterior right shoulder causing pain. No neurologic changes. No trauma. No recent illnesses. No sore throat, difficulty breathing or feves.  Tried some massage which seemed to help some.        Home Medications Prior to Admission medications   Medication Sig Start Date End Date Taking? Authorizing Provider  cyclobenzaprine (FLEXERIL) 10 MG tablet Take 1 tablet (10 mg total) by mouth 2 (two) times daily as needed for muscle spasms. 04/20/23  Yes Arnelle Nale, Barbara Cower, MD  meloxicam (MOBIC) 15 MG tablet Take 1 tablet (15 mg total) by mouth daily for 7 days. 04/20/23 04/27/23 Yes Renlee Floor, Barbara Cower, MD  acetaminophen (TYLENOL) 650 MG CR tablet Take 1,300 mg by mouth 2 (two) times daily as needed for pain.    [provider]  albuterol (ACCUNEB) 0.63 MG/3ML nebulizer solution Take 3 mLs (0.63 mg total) by nebulization every 6 (six) hours as needed for wheezing or shortness of breath. 11/18/22 02/16/23  Theadora Rama Scales, PA-C  albuterol (VENTOLIN HFA) 108 (90 Base) MCG/ACT inhaler Inhale 2 puffs into the lungs every 6 (six) hours as needed for wheezing or shortness of breath (Cough). 11/18/22   Theadora Rama Scales, PA-C  amLODipine (NORVASC) 10 MG tablet Take 10 mg by mouth at bedtime. 12/28/19   [provider]  budesonide-formoterol (SYMBICORT) 160-4.5 MCG/ACT inhaler Inhale 2 puffs into the lungs every 12 (twelve) hours. 11/18/22 02/16/23  Theadora Rama Scales, PA-C  cetirizine (ZYRTEC ALLERGY) 10 MG tablet Take 1 tablet (10 mg total) by mouth at bedtime. 11/18/22 05/17/23  Theadora Rama  Scales, PA-C  Cholecalciferol (VITAMIN D-3 PO) Take 1 capsule by mouth in the morning. Vitamin D-3, unknown strength.    [provider]  diclofenac Sodium (VOLTAREN) 1 % GEL Apply 4 g topically 4 (four) times daily. 12/09/22   McDonald, Rachelle Hora, DPM  fluticasone (FLONASE) 50 MCG/ACT nasal spray Place 1 spray into both nostrils daily. Begin by using 2 sprays in each nare daily for 3 to 5 days, then decrease to 1 spray in each nare daily. 11/18/22   Theadora Rama Scales, PA-C  gabapentin (NEURONTIN) 100 MG capsule Take 100 mg by mouth 3 (three) times daily.    [provider]  hydrochlorothiazide (HYDRODIURIL) 12.5 MG tablet Take 12.5 mg by mouth in the morning. 08/01/21   [provider]  insulin aspart (NOVOLOG) 100 UNIT/ML FlexPen Inject 4 Units into the skin 3 (three) times daily with meals. 03/31/22   Jerald Kief, MD  Insulin Glargine [facility]) 100 UNIT/ML Inject 12 Units into the skin daily. 03/31/22   Jerald Kief, MD  levonorgestrel (MIRENA) 20 MCG/24HR IUD 1 each by Intrauterine route once. Implanted October 2014    [provider]  losartan (COZAAR) 100 MG tablet Take 1 tablet (100 mg total) by mouth daily. 01/29/20   Drema Dallas, MD  metoprolol tartrate (LOPRESSOR) 25 MG tablet Take 0.5 tablets (12.5 mg total) by mouth 2 (two) times daily. 03/31/22 04/30/22  Jerald Kief, MD  POTASSIUM GLUCONATE PO Take 1 tablet by mouth in  the morning. OTC potassium supplement, unknown strength.    [provider]      Allergies    Macrobid [nitrofurantoin macrocrystal], Ultram [tramadol], Zestril [lisinopril], and Norco [hydrocodone-acetaminophen]    Review of Systems   Review of Systems  Physical Exam Updated Vital Signs BP (!) 123/92 (BP Location: Right Arm)   Pulse 98   Temp 98.1 F (36.7 C) (Oral)   Resp 18   Wt 106.6 kg   LMP  (LMP Unknown)   SpO2 94%   BMI 42.98 kg/m  Physical Exam Vitals and nursing note reviewed.   Constitutional:      Appearance: She is well-developed.  HENT:     Head: Normocephalic and atraumatic.  Cardiovascular:     Rate and Rhythm: Normal rate and regular rhythm.  Pulmonary:     Effort: No respiratory distress.     Breath sounds: No stridor.  Abdominal:     General: There is no distension.  Musculoskeletal:     Cervical back: Normal range of motion.     Comments: Spasming in the trapezius area.  Not noted necessarily in the sternocleidomastoid however suspect that is the case as well.    Neurological:     Mental Status: She is alert.     ED Results / Procedures / Treatments   Labs (all labs ordered are listed, but only abnormal results are displayed) Labs Reviewed - No data to display  EKG None  Radiology No results found.  Procedures Procedures    Medications Ordered in ED Medications  ketorolac (TORADOL) injection 30 mg (30 mg Intramuscular Given 04/20/23 0139)  midazolam (VERSED) 2 MG/ML syrup 2 mg (2 mg Oral Given 04/20/23 0140)    ED Course/ Medical Decision Making/ A&P                                 Medical Decision Making Risk Prescription drug management.   Suspect torticollis.  No signs or symptoms to suggest just retropharyngeal abscess, meningitis, traumatic injury, carotid artery injury or other emergent causes at this time or need for imaging.  Will treat symptomatically with PCP follow-up.  Suggested rice therapy to her and her husband.   Final Clinical Impression(s) / ED Diagnoses Final diagnoses:  Torticollis    Rx / DC Orders ED Discharge Orders          Ordered    meloxicam (MOBIC) 15 MG tablet  Daily        04/20/23 0150    cyclobenzaprine (FLEXERIL) 10 MG tablet  2 times daily PRN        04/20/23 0150              Alithea Lapage, Barbara Cower, MD 04/20/23 0155

## 2023-11-09 ENCOUNTER — Emergency Department (HOSPITAL_COMMUNITY)

## 2023-11-09 ENCOUNTER — Inpatient Hospital Stay (HOSPITAL_COMMUNITY)
Admission: EM | Admit: 2023-11-09 | Discharge: 2023-11-12 | DRG: 872 | Disposition: A | Discharge status: Home / Self Care | Attending: Internal Medicine | Admitting: Internal Medicine

## 2023-11-09 ENCOUNTER — Encounter (HOSPITAL_COMMUNITY): Payer: Self-pay

## 2023-11-09 ENCOUNTER — Other Ambulatory Visit: Payer: Self-pay

## 2023-11-09 DIAGNOSIS — E119 Type 2 diabetes mellitus without complications: Secondary | ICD-10-CM | POA: Diagnosis present

## 2023-11-09 DIAGNOSIS — Z9049 Acquired absence of other specified parts of digestive tract: Secondary | ICD-10-CM

## 2023-11-09 DIAGNOSIS — I10 Essential (primary) hypertension: Secondary | ICD-10-CM | POA: Diagnosis present

## 2023-11-09 DIAGNOSIS — I4892 Unspecified atrial flutter: Secondary | ICD-10-CM | POA: Diagnosis present

## 2023-11-09 DIAGNOSIS — Z881 Allergy status to other antibiotic agents status: Secondary | ICD-10-CM

## 2023-11-09 DIAGNOSIS — J45909 Unspecified asthma, uncomplicated: Secondary | ICD-10-CM | POA: Diagnosis present

## 2023-11-09 DIAGNOSIS — Z885 Allergy status to narcotic agent status: Secondary | ICD-10-CM

## 2023-11-09 DIAGNOSIS — Z8249 Family history of ischemic heart disease and other diseases of the circulatory system: Secondary | ICD-10-CM

## 2023-11-09 DIAGNOSIS — A4151 Sepsis due to Escherichia coli [E. coli]: Principal | ICD-10-CM | POA: Diagnosis present

## 2023-11-09 DIAGNOSIS — Z8616 Personal history of COVID-19: Secondary | ICD-10-CM

## 2023-11-09 DIAGNOSIS — G4733 Obstructive sleep apnea (adult) (pediatric): Secondary | ICD-10-CM | POA: Diagnosis present

## 2023-11-09 DIAGNOSIS — Z1152 Encounter for screening for COVID-19: Secondary | ICD-10-CM

## 2023-11-09 DIAGNOSIS — Z833 Family history of diabetes mellitus: Secondary | ICD-10-CM

## 2023-11-09 DIAGNOSIS — Z79899 Other long term (current) drug therapy: Secondary | ICD-10-CM

## 2023-11-09 DIAGNOSIS — M79673 Pain in unspecified foot: Secondary | ICD-10-CM | POA: Diagnosis present

## 2023-11-09 DIAGNOSIS — Z888 Allergy status to other drugs, medicaments and biological substances status: Secondary | ICD-10-CM

## 2023-11-09 DIAGNOSIS — N10 Acute tubulo-interstitial nephritis: Secondary | ICD-10-CM | POA: Diagnosis present

## 2023-11-09 DIAGNOSIS — E876 Hypokalemia: Secondary | ICD-10-CM | POA: Diagnosis present

## 2023-11-09 DIAGNOSIS — N12 Tubulo-interstitial nephritis, not specified as acute or chronic: Secondary | ICD-10-CM | POA: Diagnosis not present

## 2023-11-09 DIAGNOSIS — Z794 Long term (current) use of insulin: Secondary | ICD-10-CM

## 2023-11-09 DIAGNOSIS — Z6841 Body Mass Index (BMI) 40.0 and over, adult: Secondary | ICD-10-CM

## 2023-11-09 DIAGNOSIS — Z7951 Long term (current) use of inhaled steroids: Secondary | ICD-10-CM

## 2023-11-09 LAB — CBC WITH DIFFERENTIAL/PLATELET
Abs Immature Granulocytes: 0.14 10*3/uL — ABNORMAL HIGH (ref 0.00–0.07)
Basophils Absolute: 0.1 10*3/uL (ref 0.0–0.1)
Basophils Relative: 0 %
Eosinophils Absolute: 0 10*3/uL (ref 0.0–0.5)
Eosinophils Relative: 0 %
HCT: 40.4 % (ref 36.0–46.0)
Hemoglobin: 13.2 g/dL (ref 12.0–15.0)
Immature Granulocytes: 1 %
Lymphocytes Relative: 7 %
Lymphs Abs: 1 10*3/uL (ref 0.7–4.0)
MCH: 27.1 pg (ref 26.0–34.0)
MCHC: 32.7 g/dL (ref 30.0–36.0)
MCV: 83 fL (ref 80.0–100.0)
Monocytes Absolute: 2 10*3/uL — ABNORMAL HIGH (ref 0.1–1.0)
Monocytes Relative: 13 %
Neutro Abs: 12 10*3/uL — ABNORMAL HIGH (ref 1.7–7.7)
Neutrophils Relative %: 79 %
Platelets: 311 10*3/uL (ref 150–400)
RBC: 4.87 MIL/uL (ref 3.87–5.11)
RDW: 13.7 % (ref 11.5–15.5)
WBC: 15.2 10*3/uL — ABNORMAL HIGH (ref 4.0–10.5)
nRBC: 0 % (ref 0.0–0.2)

## 2023-11-09 LAB — COMPREHENSIVE METABOLIC PANEL WITH GFR
ALT: 23 U/L (ref 0–44)
AST: 23 U/L (ref 15–41)
Albumin: 3.9 g/dL (ref 3.5–5.0)
Alkaline Phosphatase: 80 U/L (ref 38–126)
Anion gap: 11 (ref 5–15)
BUN: 9 mg/dL (ref 6–20)
CO2: 25 mmol/L (ref 22–32)
Calcium: 9.1 mg/dL (ref 8.9–10.3)
Chloride: 99 mmol/L (ref 98–111)
Creatinine, Ser: 0.68 mg/dL (ref 0.44–1.00)
GFR, Estimated: >60 mL/min (ref >60)
Glucose, Bld: 130 mg/dL — ABNORMAL HIGH (ref 70–99)
Potassium: 2.8 mmol/L — ABNORMAL LOW (ref 3.5–5.1)
Sodium: 135 mmol/L (ref 135–145)
Total Bilirubin: 1.6 mg/dL — ABNORMAL HIGH (ref 0.0–1.2)
Total Protein: 7.5 g/dL (ref 6.5–8.1)

## 2023-11-09 LAB — RESP PANEL BY RT-PCR (RSV, FLU A&B, COVID)  RVPGX2
Influenza A by PCR: NEGATIVE
Influenza B by PCR: NEGATIVE
Resp Syncytial Virus by PCR: NEGATIVE
SARS Coronavirus 2 by RT PCR: NEGATIVE

## 2023-11-09 LAB — URINALYSIS, ROUTINE W REFLEX MICROSCOPIC
Bilirubin Urine: NEGATIVE
Glucose, UA: NEGATIVE mg/dL
Ketones, ur: 5 mg/dL — AB
Nitrite: POSITIVE — AB
Protein, ur: NEGATIVE mg/dL
Specific Gravity, Urine: 1.012 (ref 1.005–1.030)
WBC, UA: >50 WBC/hpf (ref 0–5)
pH: 6 (ref 5.0–8.0)

## 2023-11-09 LAB — I-STAT CHEM 8, ED
BUN: 7 mg/dL (ref 6–20)
Calcium, Ion: 1.15 mmol/L (ref 1.15–1.40)
Chloride: 97 mmol/L — ABNORMAL LOW (ref 98–111)
Creatinine, Ser: 0.7 mg/dL (ref 0.44–1.00)
Glucose, Bld: 116 mg/dL — ABNORMAL HIGH (ref 70–99)
HCT: 32 % — ABNORMAL LOW (ref 36.0–46.0)
Hemoglobin: 10.9 g/dL — ABNORMAL LOW (ref 12.0–15.0)
Potassium: 3 mmol/L — ABNORMAL LOW (ref 3.5–5.1)
Sodium: 137 mmol/L (ref 135–145)
TCO2: 29 mmol/L (ref 22–32)

## 2023-11-09 LAB — I-STAT CG4 LACTIC ACID, ED: Lactic Acid, Venous: 1.1 mmol/L (ref 0.5–1.9)

## 2023-11-09 LAB — MAGNESIUM: Magnesium: 1.8 mg/dL (ref 1.7–2.4)

## 2023-11-09 LAB — HCG, QUANTITATIVE, PREGNANCY: hCG, Beta Chain, Quant, S: <1 m[IU]/mL (ref <5)

## 2023-11-09 MED ORDER — GABAPENTIN 100 MG PO CAPS
100.0000 mg | ORAL_CAPSULE | Freq: Three times a day (TID) | ORAL | Status: DC
  Administered 2023-11-09 – 2023-11-11 (×7): 100 mg via ORAL
  Filled 2023-11-09 (×8): qty 1

## 2023-11-09 MED ORDER — SODIUM CHLORIDE 0.9 % IV BOLUS
1000.0000 mL | Freq: Once | INTRAVENOUS | Status: AC
  Administered 2023-11-09: 1000 mL via INTRAVENOUS

## 2023-11-09 MED ORDER — IOHEXOL 300 MG/ML  SOLN
100.0000 mL | Freq: Once | INTRAMUSCULAR | Status: AC | PRN
  Administered 2023-11-09: 100 mL via INTRAVENOUS

## 2023-11-09 MED ORDER — ACETAMINOPHEN 650 MG RE SUPP: 650.0000 mg | Freq: Four times a day (QID) | RECTAL | Status: DC | PRN

## 2023-11-09 MED ORDER — POTASSIUM CHLORIDE CRYS ER 20 MEQ PO TBCR
80.0000 meq | EXTENDED_RELEASE_TABLET | Freq: Once | ORAL | Status: AC
  Administered 2023-11-09: 80 meq via ORAL
  Filled 2023-11-09: qty 4

## 2023-11-09 MED ORDER — MORPHINE SULFATE (PF) 4 MG/ML IV SOLN
4.0000 mg | Freq: Once | INTRAVENOUS | Status: AC
  Administered 2023-11-09: 4 mg via INTRAVENOUS
  Filled 2023-11-09: qty 1

## 2023-11-09 MED ORDER — SODIUM CHLORIDE (PF) 0.9 % IJ SOLN
INTRAMUSCULAR | Status: AC
  Filled 2023-11-09: qty 50

## 2023-11-09 MED ORDER — FENTANYL CITRATE PF 50 MCG/ML IJ SOSY
50.0000 ug | PREFILLED_SYRINGE | Freq: Once | INTRAMUSCULAR | Status: AC
  Administered 2023-11-09: 50 ug via INTRAVENOUS
  Filled 2023-11-09: qty 1

## 2023-11-09 MED ORDER — AMLODIPINE BESYLATE 10 MG PO TABS
10.0000 mg | ORAL_TABLET | Freq: Every day | ORAL | Status: DC
  Administered 2023-11-09: 10 mg via ORAL
  Filled 2023-11-09: qty 1

## 2023-11-09 MED ORDER — KETOROLAC TROMETHAMINE 15 MG/ML IJ SOLN
15.0000 mg | Freq: Once | INTRAMUSCULAR | Status: AC
  Administered 2023-11-09: 15 mg via INTRAVENOUS
  Filled 2023-11-09: qty 1

## 2023-11-09 MED ORDER — METOPROLOL TARTRATE 12.5 MG HALF TABLET
12.5000 mg | ORAL_TABLET | Freq: Two times a day (BID) | ORAL | Status: DC
  Administered 2023-11-09 – 2023-11-12 (×6): 12.5 mg via ORAL
  Filled 2023-11-09 (×6): qty 1

## 2023-11-09 MED ORDER — LOSARTAN POTASSIUM 50 MG PO TABS
100.0000 mg | ORAL_TABLET | Freq: Every day | ORAL | Status: DC
  Administered 2023-11-09: 100 mg via ORAL
  Filled 2023-11-09: qty 2

## 2023-11-09 MED ORDER — SODIUM CHLORIDE 0.9 % IV SOLN
2.0000 g | Freq: Once | INTRAVENOUS | Status: AC
  Administered 2023-11-09: 2 g via INTRAVENOUS
  Filled 2023-11-09: qty 20

## 2023-11-09 MED ORDER — ACETAMINOPHEN 325 MG PO TABS
650.0000 mg | ORAL_TABLET | Freq: Once | ORAL | Status: AC
  Administered 2023-11-09: 650 mg via ORAL
  Filled 2023-11-09: qty 2

## 2023-11-09 MED ORDER — ACETAMINOPHEN 325 MG PO TABS
650.0000 mg | ORAL_TABLET | Freq: Four times a day (QID) | ORAL | Status: DC | PRN
  Administered 2023-11-09 – 2023-11-10 (×3): 650 mg via ORAL
  Filled 2023-11-09 (×3): qty 2

## 2023-11-09 MED ORDER — ONDANSETRON HCL 4 MG/2ML IJ SOLN
4.0000 mg | Freq: Four times a day (QID) | INTRAMUSCULAR | Status: DC | PRN
Start: 2023-11-09 — End: 2023-11-12

## 2023-11-09 MED ORDER — FENTANYL CITRATE PF 50 MCG/ML IJ SOSY: 50.0000 ug | PREFILLED_SYRINGE | INTRAMUSCULAR | Status: DC | PRN

## 2023-11-09 MED ORDER — ONDANSETRON HCL 4 MG/2ML IJ SOLN
4.0000 mg | Freq: Once | INTRAMUSCULAR | Status: AC
  Administered 2023-11-09: 4 mg via INTRAVENOUS
  Filled 2023-11-09: qty 2

## 2023-11-09 MED ORDER — POTASSIUM CHLORIDE CRYS ER 20 MEQ PO TBCR
40.0000 meq | EXTENDED_RELEASE_TABLET | Freq: Once | ORAL | Status: AC
  Administered 2023-11-09: 40 meq via ORAL
  Filled 2023-11-09: qty 2

## 2023-11-09 MED ORDER — HYDROCHLOROTHIAZIDE 12.5 MG PO TABS: 12.5000 mg | ORAL_TABLET | Freq: Every day | ORAL | Status: DC

## 2023-11-09 MED ORDER — ENOXAPARIN SODIUM 40 MG/0.4ML IJ SOSY
40.0000 mg | PREFILLED_SYRINGE | Freq: Every day | INTRAMUSCULAR | Status: DC
  Administered 2023-11-09 – 2023-11-11 (×3): 40 mg via SUBCUTANEOUS
  Filled 2023-11-09 (×3): qty 0.4

## 2023-11-09 MED ORDER — LACTATED RINGERS IV SOLN: INTRAVENOUS | Status: DC

## 2023-11-09 MED ORDER — OXYCODONE HCL 5 MG PO TABS
5.0000 mg | ORAL_TABLET | ORAL | Status: DC | PRN
  Administered 2023-11-09: 5 mg via ORAL
  Filled 2023-11-09: qty 1

## 2023-11-09 MED ORDER — ONDANSETRON HCL 4 MG PO TABS: 4.0000 mg | ORAL_TABLET | Freq: Four times a day (QID) | ORAL | Status: DC | PRN

## 2023-11-09 MED ORDER — SODIUM CHLORIDE 0.9 % IV SOLN
2.0000 g | INTRAVENOUS | Status: DC
  Administered 2023-11-10 – 2023-11-11 (×2): 2 g via INTRAVENOUS
  Filled 2023-11-09 (×2): qty 20

## 2023-11-09 NOTE — Progress Notes (Signed)
   11/09/23 2250  BiPAP/CPAP/SIPAP  $ Face Mask Medium Yes  BiPAP/CPAP/SIPAP Pt Type Adult  BiPAP/CPAP/SIPAP Resmed  Mask Type Full face mask  Dentures removed? Not applicable  Mask Size Medium  EPAP 4 cmH2O (per pt)  Flow Rate 2 lpm  Patient Home Machine No  Patient Home Mask No  Patient Home Tubing No  Auto Titrate No  Device Plugged into RED Power Outlet Yes

## 2023-11-09 NOTE — H&P (Signed)
 History and Physical    Robin Davis FMW:[id number] DOB: 13-Dec-1980 DOA: 11/09/2023  PCP: [facility]   Chief Complaint:  abd pain  HPI: Robin Davis is a 43 y.o. female with medical history significant of diabetes, hypertension who presented to the emergency department due to flank pain.  Patient reported right-sided flank pain for 4 days and pain continued to worsen.  She had burning with urination.  She was concerned she had a UTI so she presented to the ER.  She was found to mildly tachycardic and hemodynamically stable.  Labs were obtained on presentation which showed leukocytosis 15.2, potassium 2.8, creatinine 0.68, urinalysis concerning for infection.  Respiratory viral panel is negative.  Blood cultures are pending.  CT scan was obtained which showed concern for pyelonephritis patient was admitted for further workup.   Review of Systems: Review of Systems  Constitutional: Negative.  Negative for chills and fever.  HENT: Negative.    Eyes: Negative.   Respiratory: Negative.    Cardiovascular: Negative.   Gastrointestinal: Negative.   Genitourinary:  Positive for dysuria and urgency.  Musculoskeletal: Negative.   Skin: Negative.   Neurological: Negative.   Endo/Heme/Allergies: Negative.   Psychiatric/Behavioral: Negative.       As per HPI otherwise 10 point review of systems negative.   Allergies  Allergen Reactions   Nitrofurantoin Macrocrystal Other (See Comments) and Shortness Of Breath    Dizziness  Other reaction(s): Other (See Comments), dizziness   Tramadol  Other (See Comments) and Shortness Of Breath    Sweating  Other reaction(s): Other (See Comments), sweating   Zestril [Lisinopril] Itching, Swelling and Cough   Hydrocodone-Acetaminophen  Nausea And Vomiting and Other (See Comments)    Dizziness  Other reaction(s): Nausea And Vomiting, dizziness    Past Medical History:  Diagnosis Date   Anxiety    Asthma     COVID-19    Diabetes mellitus without complication (HCC)    Foot pain    Hypertension    Obesity     Past Surgical History:  Procedure Laterality Date   BREAST REDUCTION SURGERY     CHOLECYSTECTOMY     REDUCTION MAMMAPLASTY  2006     reports that she has never smoked. She has never been exposed to tobacco smoke. She has never used smokeless tobacco. She reports current alcohol use. She reports that she does not use drugs.  Family History  Problem Relation Age of Onset   Hypertension Other    Diabetes Other     Prior to Admission medications   Medication Sig Start Date End Date Taking? Authorizing Provider  acetaminophen  (TYLENOL ) 650 MG CR tablet Take 1,300 mg by mouth 2 (two) times daily as needed for pain.    [provider]  albuterol  (ACCUNEB ) 0.63 MG/3ML nebulizer solution Take 3 mLs (0.63 mg total) by nebulization every 6 (six) hours as needed for wheezing or shortness of breath. 11/18/22 02/16/23  Joesph Shaver Scales, PA-C  albuterol  (VENTOLIN  HFA) 108 (90 Base) MCG/ACT inhaler Inhale 2 puffs into the lungs every 6 (six) hours as needed for wheezing or shortness of breath (Cough). 11/18/22   Joesph Shaver Scales, PA-C  amLODipine  (NORVASC ) 10 MG tablet Take 10 mg by mouth at bedtime. 12/28/19   [provider]  budesonide -formoterol  (SYMBICORT ) 160-4.5 MCG/ACT inhaler Inhale 2 puffs into the lungs every 12 (twelve) hours. 11/18/22 11/09/23  Joesph Shaver Scales, PA-C  cetirizine  (ZYRTEC  ALLERGY) 10 MG tablet Take 1 tablet (10 mg total)  by mouth at bedtime. 11/18/22 11/09/23  Joesph Shaver Scales, PA-C  Cholecalciferol (VITAMIN D-3 PO) Take 1 capsule by mouth in the morning. Vitamin D-3, unknown strength.    [provider]  cyclobenzaprine  (FLEXERIL ) 10 MG tablet Take 1 tablet (10 mg total) by mouth 2 (two) times daily as needed for muscle spasms. 04/20/23   Mesner, Selinda, MD  diclofenac  Sodium (VOLTAREN ) 1 % GEL Apply 4 g topically 4 (four) times  daily. 12/09/22   McDonald, Juliene SAUNDERS, DPM  fluticasone  (FLONASE ) 50 MCG/ACT nasal spray Place 1 spray into both nostrils daily. Begin by using 2 sprays in each nare daily for 3 to 5 days, then decrease to 1 spray in each nare daily. 11/18/22   Joesph Shaver Scales, PA-C  gabapentin (NEURONTIN) 100 MG capsule Take 100 mg by mouth 3 (three) times daily.    [provider]  hydrochlorothiazide  (HYDRODIURIL ) 12.5 MG tablet Take 12.5 mg by mouth in the morning. 08/01/21   [provider]  insulin  aspart (NOVOLOG ) 100 UNIT/ML FlexPen Inject 4 Units into the skin 3 (three) times daily with meals. 03/31/22   Cindy Garnette POUR, MD  Insulin  Glargine (BASAGLAR  [facility]) 100 UNIT/ML Inject 12 Units into the skin daily. 03/31/22   Cindy Garnette POUR, MD  levonorgestrel (MIRENA) 20 MCG/24HR IUD 1 each by Intrauterine route once. Implanted October 2014    [provider]  losartan  (COZAAR ) 100 MG tablet Take 1 tablet (100 mg total) by mouth daily. 01/29/20   Milissa Tod PARAS, MD  metoprolol  tartrate (LOPRESSOR ) 25 MG tablet Take 0.5 tablets (12.5 mg total) by mouth 2 (two) times daily. 03/31/22 11/09/23  Cindy Garnette POUR, MD  POTASSIUM GLUCONATE PO Take 1 tablet by mouth in the morning. OTC potassium supplement, unknown strength.    [provider]    Physical Exam: Vitals:   11/09/23 1457 11/09/23 1530 11/09/23 1601 11/09/23 1724  BP:  128/87 (!) 128/110 122/62  Pulse:  (!) 118 (!) 132   Resp:  16 (!) 42 18  Temp: 98.2 F (36.8 C)     TempSrc: Oral     SpO2:  90% 99%   Weight:       Physical Exam Constitutional:      Appearance: She is normal weight.  HENT:     Head: Normocephalic.     Nose: Nose normal.     Mouth/Throat:     Mouth: Mucous membranes are moist.     Pharynx: Oropharynx is clear.   Eyes:     Pupils: Pupils are equal, round, and reactive to light.    Cardiovascular:     Rate and Rhythm: Regular rhythm. Tachycardia present.  Pulmonary:     Effort:  Pulmonary effort is normal.  Abdominal:     General: Abdomen is flat.   Musculoskeletal:        General: Normal range of motion.     Cervical back: Normal range of motion.   Skin:    General: Skin is warm.     Capillary Refill: Capillary refill takes less than 2 seconds.   Neurological:     General: No focal deficit present.     Mental Status: She is alert.   Psychiatric:        Mood and Affect: Mood normal.        Labs on Admission: I have personally reviewed the patients's labs and imaging studies.  Assessment/Plan Principal Problem:   Pyelonephritis   # Acute pyelonephritis - CT imaging with  concern for pyelonephritis - Urinalysis with concern for cystitis and symptoms consistent with pyelonephritis - Leukocytosis - Negative lactic acidosis  Plan: Continue IV fluids Continue ceftriaxone  Continue IV pain medication  # Hypokalemia-replete potassium  # Hypertension-continue amlodipine , hydrochlorothiazide , losartan , metoprolol    Admission status: Observation Telemetry  Certification: The appropriate patient status for this patient is OBSERVATION. Observation status is judged to be reasonable and necessary in order to provide the required intensity of service to ensure the patient's safety. The patient's presenting symptoms, physical exam findings, and initial radiographic and laboratory data in the context of their medical condition is felt to place them at decreased risk for further clinical deterioration. Furthermore, it is anticipated that the patient will be medically stable for discharge from the hospital within 2 midnights of admission.     Lamar Dess MD [facility] If 7PM-7AM, please contact night-coverage [email or web address]  11/09/2023, 6:10 PM

## 2023-11-09 NOTE — ED Triage Notes (Signed)
 POV/ ambulatory/ R flank pain, chills, fatigue starting yesterday/ also c/o pressure when urinating/ pt concerned of poss UTI/ A&Ox4

## 2023-11-09 NOTE — ED Notes (Signed)
 MD made aware of high HR.

## 2023-11-09 NOTE — ED Provider Notes (Signed)
 [place] EMERGENCY DEPARTMENT AT [facility] Provider Note   CSN: [id number] Arrival date & time: 11/09/23  1108     Patient presents with: Flank Pain   Robin Davis is a 43 y.o. female with a history of diabetes mellitus, hypertension, and asthma who presents the ED today for flank pain.  Patient reports right-sided flank pain for the past 4 days.  States the pain has been persistent since it started.  Yesterday she started develop chills, fatigue, and pressure and burning with urination.  States that she had similar symptoms in the past when she had a UTI.  She is concerned she may have one again at this time.  Denies associated nausea, vomiting, or changes to bowel habits.    Prior to Admission medications   Medication Sig Start Date End Date Taking? Authorizing Provider  acetaminophen  (TYLENOL ) 650 MG CR tablet Take 1,300 mg by mouth 2 (two) times daily as needed for pain.    [provider]  albuterol  (ACCUNEB ) 0.63 MG/3ML nebulizer solution Take 3 mLs (0.63 mg total) by nebulization every 6 (six) hours as needed for wheezing or shortness of breath. 11/18/22 02/16/23  Joesph Shaver Scales, PA-C  albuterol  (VENTOLIN  HFA) 108 (90 Base) MCG/ACT inhaler Inhale 2 puffs into the lungs every 6 (six) hours as needed for wheezing or shortness of breath (Cough). 11/18/22   Joesph Shaver Scales, PA-C  amLODipine  (NORVASC ) 10 MG tablet Take 10 mg by mouth at bedtime. 12/28/19   [provider]  budesonide -formoterol  (SYMBICORT ) 160-4.5 MCG/ACT inhaler Inhale 2 puffs into the lungs every 12 (twelve) hours. 11/18/22 02/16/23  Joesph Shaver Scales, PA-C  cetirizine  (ZYRTEC  ALLERGY) 10 MG tablet Take 1 tablet (10 mg total) by mouth at bedtime. 11/18/22 05/17/23  Joesph Shaver Scales, PA-C  Cholecalciferol (VITAMIN D-3 PO) Take 1 capsule by mouth in the morning. Vitamin D-3, unknown strength.    [provider]  cyclobenzaprine  (FLEXERIL ) 10 MG tablet Take 1 tablet  (10 mg total) by mouth 2 (two) times daily as needed for muscle spasms. 04/20/23   Mesner, Selinda, MD  diclofenac  Sodium (VOLTAREN ) 1 % GEL Apply 4 g topically 4 (four) times daily. 12/09/22   McDonald, Juliene SAUNDERS, DPM  fluticasone  (FLONASE ) 50 MCG/ACT nasal spray Place 1 spray into both nostrils daily. Begin by using 2 sprays in each nare daily for 3 to 5 days, then decrease to 1 spray in each nare daily. 11/18/22   Joesph Shaver Scales, PA-C  gabapentin (NEURONTIN) 100 MG capsule Take 100 mg by mouth 3 (three) times daily.    [provider]  hydrochlorothiazide  (HYDRODIURIL ) 12.5 MG tablet Take 12.5 mg by mouth in the morning. 08/01/21   [provider]  insulin  aspart (NOVOLOG ) 100 UNIT/ML FlexPen Inject 4 Units into the skin 3 (three) times daily with meals. 03/31/22   Cindy Garnette POUR, MD  Insulin  Glargine (BASAGLAR  [facility]) 100 UNIT/ML Inject 12 Units into the skin daily. 03/31/22   Cindy Garnette POUR, MD  levonorgestrel (MIRENA) 20 MCG/24HR IUD 1 each by Intrauterine route once. Implanted October 2014    [provider]  losartan  (COZAAR ) 100 MG tablet Take 1 tablet (100 mg total) by mouth daily. 01/29/20   Milissa Tod PARAS, MD  metoprolol  tartrate (LOPRESSOR ) 25 MG tablet Take 0.5 tablets (12.5 mg total) by mouth 2 (two) times daily. 03/31/22 04/30/22  Cindy Garnette POUR, MD  POTASSIUM GLUCONATE PO Take 1 tablet by mouth in the morning. OTC potassium supplement, unknown strength.  [provider]    Allergies: Macrobid [nitrofurantoin macrocrystal], Ultram  [tramadol ], Zestril [lisinopril], and Norco [hydrocodone-acetaminophen ]    Review of Systems  Genitourinary:  Positive for flank pain.  All other systems reviewed and are negative.   Updated Vital Signs BP 104/69 (BP Location: Right Arm)   Pulse (!) 108   Temp 98.2 F (36.8 C) (Oral)   Resp 20   Wt 104.8 kg   LMP 07/25/2023 (Within Weeks)   SpO2 95%   BMI 42.25 kg/m   Physical Exam Vitals and nursing  note reviewed.  Constitutional:      General: She is not in acute distress.    Appearance: Normal appearance.  HENT:     Head: Normocephalic and atraumatic.     Mouth/Throat:     Mouth: Mucous membranes are moist.   Eyes:     Conjunctiva/sclera: Conjunctivae normal.     Pupils: Pupils are equal, round, and reactive to light.    Cardiovascular:     Rate and Rhythm: Regular rhythm. Tachycardia present.     Pulses: Normal pulses.     Heart sounds: Normal heart sounds.  Pulmonary:     Effort: Pulmonary effort is normal.     Breath sounds: Normal breath sounds.  Abdominal:     Palpations: Abdomen is soft.     Tenderness: There is abdominal tenderness. There is right CVA tenderness.     Comments: TTP of epigastrium and right flank   Musculoskeletal:        General: Normal range of motion.     Cervical back: Normal range of motion.   Skin:    General: Skin is warm and dry.     Findings: No rash.   Neurological:     General: No focal deficit present.     Mental Status: She is alert.   Psychiatric:        Mood and Affect: Mood normal.        Behavior: Behavior normal.    (all labs ordered are listed, but only abnormal results are displayed) Labs Reviewed  CBC WITH DIFFERENTIAL/PLATELET - Abnormal; Notable for the following components:      Result Value   WBC 15.2 (*)    Neutro Abs 12.0 (*)    Monocytes Absolute 2.0 (*)    Abs Immature Granulocytes 0.14 (*)    All other components within normal limits  COMPREHENSIVE METABOLIC PANEL WITH GFR - Abnormal; Notable for the following components:   Potassium 2.8 (*)    Glucose, Bld 130 (*)    Total Bilirubin 1.6 (*)    All other components within normal limits  URINALYSIS, ROUTINE W REFLEX MICROSCOPIC - Abnormal; Notable for the following components:   APPearance HAZY (*)    Hgb urine dipstick MODERATE (*)    Ketones, ur 5 (*)    Nitrite POSITIVE (*)    Leukocytes,Ua LARGE (*)    Bacteria, UA MANY (*)    All other  components within normal limits  I-STAT CHEM 8, ED - Abnormal; Notable for the following components:   Potassium 3.0 (*)    Chloride 97 (*)    Glucose, Bld 116 (*)    Hemoglobin 10.9 (*)    HCT 32.0 (*)    All other components within normal limits  RESP PANEL BY RT-PCR (RSV, FLU A&B, COVID)  RVPGX2  CULTURE, BLOOD (ROUTINE X 2)  CULTURE, BLOOD (ROUTINE X 2)  URINE CULTURE  HCG, QUANTITATIVE, PREGNANCY  MAGNESIUM   I-STAT CG4 LACTIC ACID,  ED  I-STAT CG4 LACTIC ACID, ED    EKG: None  Radiology: CT ABDOMEN PELVIS W CONTRAST Result Date: 11/09/2023 CLINICAL DATA:  Abdominal pain, acute, nonlocalized Technologist notes state right flank pain with chills, fatigue and urinary pressure. EXAM: CT ABDOMEN AND PELVIS WITH CONTRAST TECHNIQUE: Multidetector CT imaging of the abdomen and pelvis was performed using the standard protocol following bolus administration of intravenous contrast. RADIATION DOSE REDUCTION: This exam was performed according to the departmental dose-optimization program which includes automated exposure control, adjustment of the mA and/or kV according to patient size and/or use of iterative reconstruction technique. CONTRAST:  OMNIPAQUE  IOHEXOL  300 MG/ML  SOLN COMPARISON:  CT 09/13/2021 FINDINGS: Lower chest: Heterogeneous pulmonary parenchyma at the lung bases. No focal airspace disease or pleural effusion. Hepatobiliary: The liver is enlarged spanning 20.1 cm cranial caudal with mild steatosis. No focal liver abnormality. Clips in the gallbladder fossa postcholecystectomy. No biliary dilatation. Pancreas: No ductal dilatation or inflammation. Spleen: Normal in size without focal abnormality. Adrenals/Urinary Tract: The adrenal glands are normal. There is heterogeneous enhancement of the upper pole of the right kidney, for example series 2, image 28. Mild right urothelial enhancement. No hydronephrosis. Occasional areas of right ureteral enhancement. Small cyst in the upper  right kidney. No further follow-up imaging is recommended. No renal calculi. The left kidney is normal in appearance. There is no definite bladder wall thickening. Stomach/Bowel: Tiny hiatal hernia. No bowel obstruction or inflammation. Normal appendix visualized. Small to moderate volume of stool in the colon. Multifocal colonic diverticulosis. No diverticulitis. Vascular/Lymphatic: No acute vascular findings. Normal caliber abdominal aorta. No enlarged lymph nodes in the abdomen or pelvis. Reproductive: IUD within the uterus appears to be appropriately positioned. No suspicious adnexal mass. Other: No free air, free fluid, or intra-abdominal fluid collection. Small fat containing left inguinal hernia. Small fat containing umbilical hernia. Musculoskeletal: There are no acute or suspicious osseous abnormalities. IMPRESSION: 1. Heterogeneous enhancement of the upper pole of the right kidney with mild right urothelial enhancement, suspicious for pyelonephritis. Recommend correlation with urinalysis. 2. Colonic diverticulosis without diverticulitis. 3. Hepatomegaly and hepatic steatosis. Electronically Signed   By: Andrea Gasman M.D.   On: 11/09/2023 15:18   DG Chest 2 View Result Date: 11/09/2023 CLINICAL DATA:  Fatigue, chills, right flank pain. EXAM: CHEST - 2 VIEW COMPARISON:  03/28/2022 FINDINGS: The cardiomediastinal contours are normal. The lungs are clear. Pulmonary vasculature is normal. No consolidation, pleural effusion, or pneumothorax. No acute osseous abnormalities are seen. IMPRESSION: No active cardiopulmonary disease.  No evidence of pneumonia. Electronically Signed   By: Andrea Gasman M.D.   On: 11/09/2023 15:03     Procedures   Medications Ordered in the ED  potassium chloride  SA (KLOR-CON  M) CR tablet 80 mEq (has no administration in time range)  acetaminophen  (TYLENOL ) tablet 650 mg (has no administration in time range)  cefTRIAXone  (ROCEPHIN ) 2 g in sodium chloride  0.9 % 100 mL  IVPB (has no administration in time range)  sodium chloride  0.9 % bolus 1,000 mL (0 mLs Intravenous Stopped 11/09/23 1431)  ondansetron  (ZOFRAN ) injection 4 mg (4 mg Intravenous Given by Other 11/09/23 1252)  morphine  (PF) 4 MG/ML injection 4 mg (4 mg Intravenous Given by Other 11/09/23 1252)  ketorolac  (TORADOL ) 15 MG/ML injection 15 mg (15 mg Intravenous Given 11/09/23 1341)  iohexol  (OMNIPAQUE ) 300 MG/ML solution 100 mL (100 mLs Intravenous Contrast Given 11/09/23 1442)  sodium chloride  0.9 % bolus 1,000 mL (1,000 mLs Intravenous New Bag/Given 11/09/23 1431)  Clinical Course as of 11/09/23 1530  Mon Nov 09, 2023  1244 hCG, quantitative, pregnancy [HT]    Clinical Course User Index [HT] Waddell Sluder, PA-C                                 Medical Decision Making Amount and/or Complexity of Data Reviewed Labs: ordered. Decision-making details documented in ED Course. Radiology: ordered.  Risk Prescription drug management.   This patient presents to the ED for concern of abdominal/flank pain pain, this involves an extensive number of treatment options, and is a complaint that carries with it a high risk of complications and morbidity.   Differential diagnosis includes: UTI, pyelonephritis, kidney stone, colitis, appendicitis, viral illness, SIRS, sepsis, etc.   Comorbidities  See HPI above   Additional History  Additional history obtained from prior records   Cardiac Monitoring / EKG  The patient was maintained on a cardiac monitor.  I personally viewed and interpreted the cardiac monitored which showed: atrial flutter with a heart rate of 120 bpm.   Lab Tests  I ordered and personally interpreted labs.  The pertinent results include:   Potassium of 2.8, glucose of 130, and total bilirubin on 1.3 on CMP otherwise reassuring WBC of 15.2 on CBC Negative pregnancy test Negative respiratory panel Initial lactic acid of 1.1. blood cultures ordered. UA positive for  nitrites and large leukocytes. Urine cultures pending.   Imaging Studies  I ordered imaging studies including CT abdomen/pelvis and CXR I independently visualized and interpreted imaging which showed:  CXR shows no active cardiopulmonary disease Heterogeneous enhancement of upper pole of right kidney with mild right urothelial enhancement, suspicious for pyelonephritis. I agree with the radiologist interpretation   Problem List / ED Course / Critical Interventions / Medication Management  Patient reports right flank pain for the past 4 days and dysuria, chills, and bodyaches last night.  Took Tylenol  without improvement of symptoms. States she had similar symptoms in the past when she had a UTI.  Feels like this may be the same.  Patient is a temperature of 99.1 F.  Ill-appearing on presentation and teary-eyed after palpation of the abdomen. Morphine , zofran , and NS given for symptoms. Toradol  given for temperature and body aches after pregnancy test resulted. Patient endorses improvement of symptoms on reevaluation. Patient was on the phone with her sister at that time and reports that patient was recently in the [place] and they had poor AC units - she is concerned that patient could be developing pneumonia. Patient denies chest pain or shortness of breath, but is requesting imaging of the chest. Ceftriaxone  ordered for pyelonephritis. Tylenol  ordered for headache.  Consulted and spoke with Dr. Dena with [facility], he is agreeable with plan for admission for further workup and evaluation of patient.    Social Determinants of Health  Access to healthcare   Test / Admission - Considered  Discussed findings with patient.  She is agreeable with the plan for admission.    Final diagnoses:  Pyelonephritis    ED Discharge Orders     None          Waddell Sluder, PA-C 11/09/23 1530    Lenor Hollering, MD 11/09/23 [phone]

## 2023-11-10 DIAGNOSIS — Z7951 Long term (current) use of inhaled steroids: Secondary | ICD-10-CM | POA: Diagnosis not present

## 2023-11-10 DIAGNOSIS — E119 Type 2 diabetes mellitus without complications: Secondary | ICD-10-CM | POA: Diagnosis present

## 2023-11-10 DIAGNOSIS — Z8616 Personal history of COVID-19: Secondary | ICD-10-CM | POA: Diagnosis not present

## 2023-11-10 DIAGNOSIS — Z6841 Body Mass Index (BMI) 40.0 and over, adult: Secondary | ICD-10-CM | POA: Diagnosis not present

## 2023-11-10 DIAGNOSIS — M79673 Pain in unspecified foot: Secondary | ICD-10-CM | POA: Diagnosis present

## 2023-11-10 DIAGNOSIS — E876 Hypokalemia: Secondary | ICD-10-CM | POA: Diagnosis present

## 2023-11-10 DIAGNOSIS — G4733 Obstructive sleep apnea (adult) (pediatric): Secondary | ICD-10-CM | POA: Diagnosis present

## 2023-11-10 DIAGNOSIS — Z833 Family history of diabetes mellitus: Secondary | ICD-10-CM | POA: Diagnosis not present

## 2023-11-10 DIAGNOSIS — J45909 Unspecified asthma, uncomplicated: Secondary | ICD-10-CM | POA: Diagnosis present

## 2023-11-10 DIAGNOSIS — Z8249 Family history of ischemic heart disease and other diseases of the circulatory system: Secondary | ICD-10-CM | POA: Diagnosis not present

## 2023-11-10 DIAGNOSIS — N10 Acute tubulo-interstitial nephritis: Secondary | ICD-10-CM | POA: Diagnosis present

## 2023-11-10 DIAGNOSIS — Z9049 Acquired absence of other specified parts of digestive tract: Secondary | ICD-10-CM | POA: Diagnosis not present

## 2023-11-10 DIAGNOSIS — Z881 Allergy status to other antibiotic agents status: Secondary | ICD-10-CM | POA: Diagnosis not present

## 2023-11-10 DIAGNOSIS — Z794 Long term (current) use of insulin: Secondary | ICD-10-CM | POA: Diagnosis not present

## 2023-11-10 DIAGNOSIS — I4892 Unspecified atrial flutter: Secondary | ICD-10-CM | POA: Diagnosis present

## 2023-11-10 DIAGNOSIS — Z888 Allergy status to other drugs, medicaments and biological substances status: Secondary | ICD-10-CM | POA: Diagnosis not present

## 2023-11-10 DIAGNOSIS — Z885 Allergy status to narcotic agent status: Secondary | ICD-10-CM | POA: Diagnosis not present

## 2023-11-10 DIAGNOSIS — I10 Essential (primary) hypertension: Secondary | ICD-10-CM | POA: Diagnosis present

## 2023-11-10 DIAGNOSIS — R109 Unspecified abdominal pain: Secondary | ICD-10-CM | POA: Diagnosis present

## 2023-11-10 DIAGNOSIS — A4151 Sepsis due to Escherichia coli [E. coli]: Secondary | ICD-10-CM | POA: Diagnosis present

## 2023-11-10 DIAGNOSIS — Z79899 Other long term (current) drug therapy: Secondary | ICD-10-CM | POA: Diagnosis not present

## 2023-11-10 DIAGNOSIS — N12 Tubulo-interstitial nephritis, not specified as acute or chronic: Secondary | ICD-10-CM | POA: Diagnosis not present

## 2023-11-10 DIAGNOSIS — Z1152 Encounter for screening for COVID-19: Secondary | ICD-10-CM | POA: Diagnosis not present

## 2023-11-10 LAB — BLOOD CULTURE ID PANEL (REFLEXED) - BCID2

## 2023-11-10 LAB — CBC
HCT: 35.8 % — ABNORMAL LOW (ref 36.0–46.0)
Hemoglobin: 11.6 g/dL — ABNORMAL LOW (ref 12.0–15.0)
MCH: 27.6 pg (ref 26.0–34.0)
MCHC: 32.4 g/dL (ref 30.0–36.0)
MCV: 85.2 fL (ref 80.0–100.0)
Platelets: 234 10*3/uL (ref 150–400)
RBC: 4.2 MIL/uL (ref 3.87–5.11)
RDW: 14.4 % (ref 11.5–15.5)
WBC: 9.3 10*3/uL (ref 4.0–10.5)
nRBC: 0 % (ref 0.0–0.2)

## 2023-11-10 LAB — BASIC METABOLIC PANEL WITH GFR
Anion gap: 9 (ref 5–15)
BUN: 9 mg/dL (ref 6–20)
CO2: 26 mmol/L (ref 22–32)
Calcium: 8.3 mg/dL — ABNORMAL LOW (ref 8.9–10.3)
Chloride: 100 mmol/L (ref 98–111)
Creatinine, Ser: 0.71 mg/dL (ref 0.44–1.00)
GFR, Estimated: >60 mL/min (ref >60)
Glucose, Bld: 170 mg/dL — ABNORMAL HIGH (ref 70–99)
Potassium: 3.5 mmol/L (ref 3.5–5.1)
Sodium: 135 mmol/L (ref 135–145)

## 2023-11-10 LAB — HEMOGLOBIN A1C
Hgb A1c MFr Bld: 6.2 % — ABNORMAL HIGH (ref 4.8–5.6)
Mean Plasma Glucose: 131.24 mg/dL

## 2023-11-10 LAB — GLUCOSE, CAPILLARY
Glucose-Capillary: 139 mg/dL — ABNORMAL HIGH (ref 70–99)
Glucose-Capillary: 89 mg/dL (ref 70–99)
Glucose-Capillary: 92 mg/dL (ref 70–99)
Glucose-Capillary: 110 mg/dL — ABNORMAL HIGH (ref 70–99)

## 2023-11-10 MED ORDER — INSULIN ASPART 100 UNIT/ML IJ SOLN
0.0000 [IU] | Freq: Three times a day (TID) | INTRAMUSCULAR | Status: DC
  Administered 2023-11-11: 2 [IU] via SUBCUTANEOUS
  Administered 2023-11-11: 1 [IU] via SUBCUTANEOUS
  Administered 2023-11-11: 2 [IU] via SUBCUTANEOUS

## 2023-11-10 MED ORDER — INSULIN ASPART 100 UNIT/ML IJ SOLN: 0.0000 [IU] | Freq: Every day | INTRAMUSCULAR | Status: DC

## 2023-11-10 MED ORDER — ORAL CARE MOUTH RINSE
15.0000 mL | OROMUCOSAL | Status: DC | PRN
Start: 2023-11-10 — End: 2023-11-12

## 2023-11-10 NOTE — Plan of Care (Signed)

## 2023-11-10 NOTE — Progress Notes (Signed)
   11/10/23 0926  TOC Brief Assessment  Insurance and Status Reviewed  Patient has primary care physician Yes  Home environment has been reviewed home w/ family  Prior level of function: independent  Prior/Current Home Services No current home services  Social Drivers of Health Review SDOH reviewed no interventions necessary  Readmission risk has been reviewed Yes  Transition of care needs no transition of care needs at this time

## 2023-11-10 NOTE — Hospital Course (Addendum)
 43 y.o. female with medical history significant of diabetes, hypertension who presented to the ED due to flank pain right-sided for 4 days and pain continued to worsen with burning on urination. In ZI:fpoiob tachycardic and hemodynamically stable.  Labs -leukocytosis 15.2, potassium 2.8, creatinine 0.68, urinalysis concerning for infection.  Respiratory viral panel is negative.  Blood cultures SENT. CT scan >>showed concern for pyelonephritis AND patient was admitted [facility] ID and blood culturei and urine culture w/ e coli- cultures have been finalized and okay for discharge Discussed with ID pharmacy team and changing to cefadroxil twice daily to complete a full 7 days  Subjective: Seen examined No complaints Eager to go home Overnight afebrile BP stable, clinically improving   Discharge diagnosis :  Sepsis POA with E. coli bacteremia Acute right pyelonephritis: Met sepsis criteria with fever, tachycardia and leukocytosis versus pyelonephritis.  Clinically improving with IV antibiotics, BCID positive for E. coli .BC ID and blood culturei and urine culture w/ e coli- cultures have been finalized and okay for discharge Discussed with ID pharmacy team and changing to cefadroxil twice daily to complete a full 7 days No more dysuria flank pain.  Afebrile.  Stable for discharge  Hypokalemia: replaced  Hypertension: BP remains stable, resume home meds upon discharge.  It seems she has been taken off metoprolol   Diabetes mellitus on long-term insulin , Ozempic: Well-controlled resume home regimen upon discharge  Recent Labs  Lab 11/10/23 0822 11/10/23 1135 11/11/23 0724 11/11/23 1136 11/11/23 1640 11/11/23 2100 11/12/23 0735  GLUCAP  --    < > 121* 162* 175* 162* 120*  HGBA1C 6.2*  --   --   --   --   --   --    < > = values in this interval not displayed.    Asthma: Continue home Symbicort   Foot pain: Continue home meds

## 2023-11-10 NOTE — Progress Notes (Signed)
 PHARMACY - PHYSICIAN COMMUNICATION CRITICAL VALUE ALERT - BLOOD CULTURE IDENTIFICATION (BCID)  Robin Davis is an 43 y.o. female who presented to [facility] on 11/09/2023 with a chief complaint of R flank pain & pressure when urinating.  Assessment: BCID + Ecoli, no resisitance  Name of physician (or Provider) Contacted: JINNY Kipper, NP  Current antibiotics: Rocephin  2gm IV q24h  Changes to prescribed antibiotics recommended:  Patient is on recommended antibiotics - No changes needed  Results for orders placed or performed during the hospital encounter of 11/09/23  Blood Culture ID Panel (Reflexed) (Collected: 11/09/2023 12:58 PM)  Result Value Ref Range   Enterococcus faecalis NOT DETECTED NOT DETECTED   Enterococcus Faecium NOT DETECTED NOT DETECTED   Listeria monocytogenes NOT DETECTED NOT DETECTED   Staphylococcus species NOT DETECTED NOT DETECTED   Staphylococcus aureus (BCID) NOT DETECTED NOT DETECTED   Staphylococcus epidermidis NOT DETECTED NOT DETECTED   Staphylococcus lugdunensis NOT DETECTED NOT DETECTED   Streptococcus species NOT DETECTED NOT DETECTED   Streptococcus agalactiae NOT DETECTED NOT DETECTED   Streptococcus pneumoniae NOT DETECTED NOT DETECTED   Streptococcus pyogenes NOT DETECTED NOT DETECTED   A.calcoaceticus-baumannii NOT DETECTED NOT DETECTED   Bacteroides fragilis NOT DETECTED NOT DETECTED   Enterobacterales DETECTED (A) NOT DETECTED   Enterobacter cloacae complex NOT DETECTED NOT DETECTED   Escherichia coli DETECTED (A) NOT DETECTED   Klebsiella aerogenes NOT DETECTED NOT DETECTED   Klebsiella oxytoca NOT DETECTED NOT DETECTED   Klebsiella pneumoniae NOT DETECTED NOT DETECTED   Proteus species NOT DETECTED NOT DETECTED   Salmonella species NOT DETECTED NOT DETECTED   Serratia marcescens NOT DETECTED NOT DETECTED   Haemophilus influenzae NOT DETECTED NOT DETECTED   Neisseria meningitidis NOT DETECTED NOT DETECTED   Pseudomonas aeruginosa NOT  DETECTED NOT DETECTED   Stenotrophomonas maltophilia NOT DETECTED NOT DETECTED   Candida albicans NOT DETECTED NOT DETECTED   Candida auris NOT DETECTED NOT DETECTED   Candida glabrata NOT DETECTED NOT DETECTED   Candida krusei NOT DETECTED NOT DETECTED   Candida parapsilosis NOT DETECTED NOT DETECTED   Candida tropicalis NOT DETECTED NOT DETECTED   Cryptococcus neoformans/gattii NOT DETECTED NOT DETECTED   CTX-M ESBL NOT DETECTED NOT DETECTED   Carbapenem resistance IMP NOT DETECTED NOT DETECTED   Carbapenem resistance KPC NOT DETECTED NOT DETECTED   Carbapenem resistance NDM NOT DETECTED NOT DETECTED   Carbapenem resist OXA 48 LIKE NOT DETECTED NOT DETECTED   Carbapenem resistance VIM NOT DETECTED NOT DETECTED    Rosaline Millet PharmD 11/10/2023  3:51 AM

## 2023-11-10 NOTE — Progress Notes (Signed)
 PROGRESS NOTE Robin Davis  FMW:[id number] DOB: Dec 23, 1980 DOA: 11/09/2023 PCP: Health, [facility]  Brief Narrative/Hospital Course: 43 y.o. female with medical history significant of diabetes, hypertension who presented to the ED due to flank pain right-sided for 4 days and pain continued to worsen with burning on urination. In ZI:fpoiob tachycardic and hemodynamically stable.  Labs -leukocytosis 15.2, potassium 2.8, creatinine 0.68, urinalysis concerning for infection.  Respiratory viral panel is negative.  Blood cultures SENT. CT scan >>showed concern for pyelonephritis AND patient was admitted  Subjective: Seen and examined Still having some right flank pain no more dysuria, no more nausea Overnight Tmax 100.1 BP stable.  Routine labs ordered this morning overnight BC ID positive with E. Coli On oxygen for comfort  Assessment and plan:  Sepsis POA with E. coli bacteremia Acute right pyelonephritis: Met sepsis criteria with fever, tachycardia and leukocytosis versus pyelonephritis.  BC ID positive for E. coli pharmacy dosing ceftriaxone  2 g daily, BP stable continue IV fluids, follow-up further culture data to adjust antibiotics.  Hypokalemia: Replete and recheck   Hypertension: BP remains stable, on HCTZ losartan  metoprolol  and amlodipine  will continue only metoprolol  for now  Diabetes mellitus on long-term insulin , Ozempic: PT and Basaglar  today units daily, continue SSI for now  Asthma: Continue home Symbicort   Foot pain: Continue home meds   Morbid Obesity w/ Body mass index is 42.25 kg/m.: OSA; Will benefit with PCP follow-up, weight loss,healthy lifestyle and outpatient sleep eval if not done. Continue CPAP.  DVT prophylaxis: enoxaparin  (LOVENOX ) injection 40 mg Start: 11/09/23 1700 SCDs Start: 11/09/23 1553 Code Status:   Code Status: Full Code Family Communication: plan of care discussed with patient at bedside. Patient status  is: Remains hospitalized because of severity of illness Level of care: Telemetry   Dispo: The patient is from: home and independent at baseline.            Anticipated disposition: TBD Objective: Vitals last 24 hrs: Vitals:   11/10/23 0030 11/10/23 0435 11/10/23 0606 11/10/23 0805  BP: 105/66 117/62  111/73  Pulse: 91 (!) 115  (!) 110  Resp: 18 20  (!) 22  Temp: 98.7 F (37.1 C) (!) 100.6 F (38.1 C) 99.4 F (37.4 C) 98.4 F (36.9 C)  TempSrc:      SpO2: 98% 98%  97%  Weight:        Physical Examination: General exam: alert awake, older than stated age HEENT:Oral mucosa moist, Ear/Nose WNL grossly Respiratory system: Bilaterally diminished BS, no use of accessory muscle Cardiovascular system: S1 & S2 +. Gastrointestinal system: Abdomen soft, tender right flank,ND,BS+ Nervous System: Alert, awake,  following commands. Extremities: LE edema neg, warm extremities Skin: No rashes,warm. MSK: Normal muscle bulk/tone.   Data Reviewed: I have personally reviewed following labs and imaging studies ( see epic result tab) CBC: Recent Labs  Lab 11/09/23 1124 11/09/23 1307 11/10/23 0822  WBC 15.2*  --  9.3  NEUTROABS 12.0*  --   --   HGB 13.2 10.9* 11.6*  HCT 40.4 32.0* 35.8*  MCV 83.0  --  85.2  PLT 311  --  234   CMP: Recent Labs  Lab 11/09/23 1124 11/09/23 1307 11/09/23 1511  NA 135 137  --   K 2.8* 3.0*  --   CL 99 97*  --   CO2 25  --   --   GLUCOSE 130* 116*  --   BUN 9 7  --   CREATININE 0.68 0.70  --  CALCIUM 9.1  --   --   MG  --   --  1.8   GFR: CrCl cannot be calculated (Unknown ideal weight.). Recent Labs  Lab 11/09/23 1124  AST 23  ALT 23  ALKPHOS 80  BILITOT 1.6*  PROT 7.5  ALBUMIN 3.9   No results for input(s): LIPASE, AMYLASE in the last 168 hours. No results for input(s): AMMONIA in the last 168 hours. Coagulation Profile: No results for input(s): INR, PROTIME in the last 168 hours. Unresulted Labs (From admission, onward)      Start     Ordered   11/11/23 0500  Basic metabolic panel with GFR  Tomorrow morning,   R        11/10/23 0755   11/11/23 0500  CBC  Tomorrow morning,   R        11/10/23 0755   11/10/23 0758  Hemoglobin A1c  Once,   R       Comments: To assess prior glycemic control    11/10/23 0757   11/10/23 0755  Basic metabolic panel with GFR  ONCE - URGENT,   URGENT        11/10/23 0754   11/09/23 1238  Urine Culture  Add-on,   AD       Question:  Indication  Answer:  Dysuria   11/09/23 1237           Antimicrobials/Microbiology: Anti-infectives (From admission, onward)    Start     Dose/Rate Route Frequency Ordered Stop   11/10/23 1600  cefTRIAXone  (ROCEPHIN ) 2 g in sodium chloride  0.9 % 100 mL IVPB        2 g 200 mL/hr over 30 Minutes Intravenous Every 24 hours 11/09/23 1556     11/09/23 1530  cefTRIAXone  (ROCEPHIN ) 2 g in sodium chloride  0.9 % 100 mL IVPB        2 g 200 mL/hr over 30 Minutes Intravenous  Once 11/09/23 1524 11/09/23 1625         Component Value Date/Time   SDES  11/09/2023 1352    BLOOD LEFT ANTECUBITAL Performed at [facility], [address], [place], [place] [address]    SPECREQUEST  11/09/2023 1352    BOTTLES DRAWN AEROBIC ONLY Blood Culture results may not be optimal due to an inadequate volume of blood received in culture bottles Performed at [facility], [address], [place], [place] [address]    BERNIS SETTLE NEGATIVE RODS 11/09/2023 1352   REPTSTATUS PENDING 11/09/2023 1352    Procedures:  Medications reviewed:  Scheduled Meds:  enoxaparin  (LOVENOX ) injection  40 mg Subcutaneous Daily   gabapentin  100 mg Oral TID   insulin  aspart  0-5 Units Subcutaneous QHS   insulin  aspart  0-9 Units Subcutaneous TID WC   metoprolol  tartrate  12.5 mg Oral BID   Continuous Infusions:  cefTRIAXone  (ROCEPHIN )  IV     lactated ringers 125 mL/hr at 11/10/23 0458    Mennie LAMY, MD [facility] 11/10/2023, 9:40 AM

## 2023-11-11 DIAGNOSIS — N12 Tubulo-interstitial nephritis, not specified as acute or chronic: Secondary | ICD-10-CM | POA: Diagnosis not present

## 2023-11-11 LAB — CBC
HCT: 34.7 % — ABNORMAL LOW (ref 36.0–46.0)
Hemoglobin: 11.5 g/dL — ABNORMAL LOW (ref 12.0–15.0)
MCH: 28 pg (ref 26.0–34.0)
MCHC: 33.1 g/dL (ref 30.0–36.0)
MCV: 84.6 fL (ref 80.0–100.0)
Platelets: 237 10*3/uL (ref 150–400)
RBC: 4.1 MIL/uL (ref 3.87–5.11)
RDW: 14.4 % (ref 11.5–15.5)
WBC: 6.3 10*3/uL (ref 4.0–10.5)
nRBC: 0 % (ref 0.0–0.2)

## 2023-11-11 LAB — GLUCOSE, CAPILLARY
Glucose-Capillary: 121 mg/dL — ABNORMAL HIGH (ref 70–99)
Glucose-Capillary: 162 mg/dL — ABNORMAL HIGH (ref 70–99)
Glucose-Capillary: 175 mg/dL — ABNORMAL HIGH (ref 70–99)
Glucose-Capillary: 162 mg/dL — ABNORMAL HIGH (ref 70–99)

## 2023-11-11 LAB — BASIC METABOLIC PANEL WITH GFR
Anion gap: 8 (ref 5–15)
BUN: <5 mg/dL — ABNORMAL LOW (ref 6–20)
CO2: 27 mmol/L (ref 22–32)
Calcium: 8.6 mg/dL — ABNORMAL LOW (ref 8.9–10.3)
Chloride: 102 mmol/L (ref 98–111)
Creatinine, Ser: <0.3 mg/dL — ABNORMAL LOW (ref 0.44–1.00)
Glucose, Bld: 114 mg/dL — ABNORMAL HIGH (ref 70–99)
Potassium: 3.3 mmol/L — ABNORMAL LOW (ref 3.5–5.1)
Sodium: 137 mmol/L (ref 135–145)

## 2023-11-11 LAB — URINE CULTURE: Culture: 70000 — AB

## 2023-11-11 MED ORDER — POTASSIUM CHLORIDE CRYS ER 20 MEQ PO TBCR
40.0000 meq | EXTENDED_RELEASE_TABLET | Freq: Once | ORAL | Status: AC
  Administered 2023-11-11: 40 meq via ORAL
  Filled 2023-11-11: qty 2

## 2023-11-11 NOTE — Plan of Care (Signed)

## 2023-11-11 NOTE — Progress Notes (Signed)
   11/11/23 0117  BiPAP/CPAP/SIPAP  BiPAP/CPAP/SIPAP Pt Type Adult  BiPAP/CPAP/SIPAP Resmed  Mask Type Full face mask  Dentures removed? Not applicable  Mask Size Medium  EPAP 4 cmH2O  FiO2 (%) 21 %  Patient Home Machine No  Patient Home Mask No  Patient Home Tubing No  Auto Titrate No  Device Plugged into RED Power Outlet Yes

## 2023-11-11 NOTE — Progress Notes (Signed)
 PROGRESS NOTE TIAUNA WHISNANT  FMW:[id number] DOB: 1981/01/18 DOA: 11/09/2023 PCP: Health, [facility]  Brief Narrative/Hospital Course: 43 y.o. female with medical history significant of diabetes, hypertension who presented to the ED due to flank pain right-sided for 4 days and pain continued to worsen with burning on urination. In ZI:fpoiob tachycardic and hemodynamically stable.  Labs -leukocytosis 15.2, potassium 2.8, creatinine 0.68, urinalysis concerning for infection.  Respiratory viral panel is negative.  Blood cultures SENT. CT scan >>showed concern for pyelonephritis AND patient was admitted  Subjective: Patient seen and examined Overall feels much better no flank pain dysuria improving Husband at the bedside Overnight Tmax 100.9 yesterday afternoon, mild tachycardia 110s BP stable, on room air Labs shows WBC down to 6.3 mild hypokalemia otherwise unremarkable  Assessment and plan:  Sepsis POA with E. coli bacteremia Acute right pyelonephritis: Met sepsis criteria with fever, tachycardia and leukocytosis versus pyelonephritis. BCID positive for E. coli - C/S report pending.  Continue  ceftriaxone  2 g daily.   Hypokalemia: replaced  Hypertension: BP remains stable, on HCTZ losartan  metoprolol  and amlodipine  will continue only metoprolol  for now  Diabetes mellitus on long-term insulin , Ozempic: PTA on Basaglar  cont sliding scale for now Recent Labs  Lab 11/10/23 0810 11/10/23 0822 11/10/23 1135 11/10/23 1624 11/10/23 2150 11/11/23 0724  GLUCAP 139*  --  89 92 110* 121*  HGBA1C  --  6.2*  --   --   --   --     Asthma: Continue home Symbicort   Foot pain: Continue home meds   Morbid Obesity w/ Body mass index is 42.26 kg/m.: OSA; Will benefit with PCP follow-up, weight loss,healthy lifestyle and outpatient sleep eval if not done. Continue CPAP.  DVT prophylaxis: enoxaparin  (LOVENOX ) injection 40 mg Start: 11/09/23 1700 SCDs Start:  11/09/23 1553 Code Status:   Code Status: Full Code Family Communication: plan of care discussed with patient at bedside. Patient status is: Remains hospitalized because of severity of illness Level of care: Med-Surg   Dispo: The patient is from: home and independent at baseline.            Anticipated disposition: Anticipate discharge tomorrow if culture finalizes  Objective: Vitals last 24 hrs: Vitals:   11/10/23 1748 11/10/23 2152 11/10/23 2211 11/11/23 0308  BP: 116/81 137/84 133/78 133/84  Pulse: (!) 111 (!) 108 (!) 105 (!) 110  Resp:  16  16  Temp: 99.2 F (37.3 C) 99.1 F (37.3 C)  99.8 F (37.7 C)  TempSrc:      SpO2: 94% 94%  95%  Weight:      Height:        Physical Examination: General exam: alert awake, oriented at baseline, older than stated age HEENT:Oral mucosa moist, Ear/Nose WNL grossly Respiratory system: Bilaterally clear BS,no use of accessory muscle Cardiovascular system: S1 & S2 +, No JVD. Gastrointestinal system: Abdomen soft,NT,ND, BS+ Nervous System: Alert, awake, moving all extremities,and following commands. Extremities: LE edema neg,distal peripheral pulses palpable and warm.  Skin: No rashes,no icterus. MSK: Normal muscle bulk,tone, power   Data Reviewed: I have personally reviewed following labs and imaging studies ( see epic result tab) CBC: Recent Labs  Lab 11/09/23 1124 11/09/23 1307 11/10/23 0822 11/11/23 0553  WBC 15.2*  --  9.3 6.3  NEUTROABS 12.0*  --   --   --   HGB 13.2 10.9* 11.6* 11.5*  HCT 40.4 32.0* 35.8* 34.7*  MCV 83.0  --  85.2 84.6  PLT 311  --  234 237   CMP: Recent Labs  Lab 11/09/23 1124 11/09/23 1307 11/09/23 1511 11/10/23 0822 11/11/23 0553  NA 135 137  --  135 137  K 2.8* 3.0*  --  3.5 3.3*  CL 99 97*  --  100 102  CO2 25  --   --  26 27  GLUCOSE 130* 116*  --  170* 114*  BUN 9 7  --  9 <5*  CREATININE 0.68 0.70  --  0.71 <0.30*  CALCIUM 9.1  --   --  8.3* 8.6*  MG  --   --  1.8  --   --    GFR:  CrCl cannot be calculated (This lab value cannot be used to calculate CrCl because it is not a number: <0.30). Recent Labs  Lab 11/09/23 1124  AST 23  ALT 23  ALKPHOS 80  BILITOT 1.6*  PROT 7.5  ALBUMIN 3.9   No results for input(s): LIPASE, AMYLASE in the last 168 hours. No results for input(s): AMMONIA in the last 168 hours. Coagulation Profile: No results for input(s): INR, PROTIME in the last 168 hours. Unresulted Labs (From admission, onward)    None      Antimicrobials/Microbiology: Anti-infectives (From admission, onward)    Start     Dose/Rate Route Frequency Ordered Stop   11/10/23 1600  cefTRIAXone  (ROCEPHIN ) 2 g in sodium chloride  0.9 % 100 mL IVPB        2 g 200 mL/hr over 30 Minutes Intravenous Every 24 hours 11/09/23 1556     11/09/23 1530  cefTRIAXone  (ROCEPHIN ) 2 g in sodium chloride  0.9 % 100 mL IVPB        2 g 200 mL/hr over 30 Minutes Intravenous  Once 11/09/23 1524 11/09/23 1625         Component Value Date/Time   SDES  11/09/2023 1352    BLOOD LEFT ANTECUBITAL Performed at [facility], [address] [place], [place] [address]    SPECREQUEST  11/09/2023 1352    BOTTLES DRAWN AEROBIC ONLY Blood Culture results may not be optimal due to an inadequate volume of blood received in culture bottles Performed at [facility], [address], [place], [place] [address]    BERNIS SETTLE NEGATIVE RODS 11/09/2023 1352   REPTSTATUS PENDING 11/09/2023 1352    Procedures:  Medications reviewed:  Scheduled Meds:  enoxaparin  (LOVENOX ) injection  40 mg Subcutaneous Daily   gabapentin  100 mg Oral TID   insulin  aspart  0-5 Units Subcutaneous QHS   insulin  aspart  0-9 Units Subcutaneous TID WC   metoprolol  tartrate  12.5 mg Oral BID   Continuous Infusions:  cefTRIAXone  (ROCEPHIN )  IV 2 g (11/10/23 1536)   lactated ringers 125 mL/hr at 11/10/23 2239    Mennie LAMY, MD [facility] 11/11/2023, 10:36 AM

## 2023-11-12 ENCOUNTER — Other Ambulatory Visit (HOSPITAL_COMMUNITY): Payer: Self-pay

## 2023-11-12 DIAGNOSIS — N12 Tubulo-interstitial nephritis, not specified as acute or chronic: Secondary | ICD-10-CM | POA: Diagnosis not present

## 2023-11-12 LAB — GLUCOSE, CAPILLARY: Glucose-Capillary: 120 mg/dL — ABNORMAL HIGH (ref 70–99)

## 2023-11-12 LAB — CULTURE, BLOOD (ROUTINE X 2): Special Requests: ADEQUATE

## 2023-11-12 MED ORDER — CEFADROXIL 500 MG PO CAPS
1000.0000 mg | ORAL_CAPSULE | Freq: Two times a day (BID) | ORAL | 0 refills | Status: AC
  Filled 2023-11-12: qty 16, 4d supply, fill #0

## 2023-11-12 MED ORDER — CEFADROXIL 500 MG PO CAPS
1000.0000 mg | ORAL_CAPSULE | Freq: Two times a day (BID) | ORAL | Status: DC
  Administered 2023-11-12: 1000 mg via ORAL
  Filled 2023-11-12: qty 2

## 2023-11-12 NOTE — Discharge Summary (Signed)
 Physician Discharge Summary  Robin Davis FMW:[id number] DOB: 1981-04-20 DOA: 11/09/2023  PCP: Sal[facility]  Admit date: 11/09/2023 Discharge date: 11/12/2023 Recommendations for Outpatient Follow-up:  Follow up with PCP in 1 weeks-call for appointment Please obtain BMP/CBC in one week  Discharge Dispo: Home Discharge Condition: Stable Code Status:   Code Status: Full Code Diet recommendation:  Diet Order             Diet regular Room service appropriate? Yes; Fluid consistency: Thin  Diet effective now                    Brief/Interim Summary: 43 y.o. female with medical history significant of diabetes, hypertension who presented to the ED due to flank pain right-sided for 4 days and pain continued to worsen with burning on urination. In ZI:fpoiob tachycardic and hemodynamically stable.  Labs -leukocytosis 15.2, potassium 2.8, creatinine 0.68, urinalysis concerning for infection.  Respiratory viral panel is negative.  Blood cultures SENT. CT scan >>showed concern for pyelonephritis AND patient was admitted [facility] ID and blood culturei and urine culture w/ e coli- cultures have been finalized and okay for discharge Discussed with ID pharmacy team and changing to cefadroxil twice daily to complete a full 7 days  Subjective: Seen examined No complaints Eager to go home Overnight afebrile BP stable, clinically improving   Discharge diagnosis :  Sepsis POA with E. coli bacteremia Acute right pyelonephritis: Met sepsis criteria with fever, tachycardia and leukocytosis versus pyelonephritis.  Clinically improving with IV antibiotics, BCID positive for E. coli .BC ID and blood culturei and urine culture w/ e coli- cultures have been finalized and okay for discharge Discussed with ID pharmacy team and changing to cefadroxil twice daily to complete a full 7 days No more dysuria flank pain.  Afebrile.  Stable for  discharge  Hypokalemia: replaced  Hypertension: BP remains stable, resume home meds upon discharge.  It seems she has been taken off metoprolol   Diabetes mellitus on long-term insulin , Ozempic: Well-controlled resume home regimen upon discharge  Recent Labs  Lab 11/10/23 0822 11/10/23 1135 11/11/23 0724 11/11/23 1136 11/11/23 1640 11/11/23 2100 11/12/23 0735  GLUCAP  --    < > 121* 162* 175* 162* 120*  HGBA1C 6.2*  --   --   --   --   --   --    < > = values in this interval not displayed.    Asthma: Continue home Symbicort   Foot pain: Continue home meds  Discharge Exam: Vitals:   11/11/23 1952 11/12/23 0430  BP: (!) 149/86 124/84  Pulse: (!) 103 95  Resp: 16 16  Temp: 99.3 F (37.4 C) 98.9 F (37.2 C)  SpO2: 98% 98%   General: Pt is alert, awake, not in acute distress Cardiovascular: RRR, S1/S2 +, no rubs, no gallops Respiratory: CTA bilaterally, no wheezing, no rhonchi Abdominal: Soft, NT, ND, bowel sounds + Extremities: no edema, no cyanosis  Discharge Instructions  Discharge Instructions     Discharge instructions   Complete by: As directed    Please call call MD or return to ER for similar or worsening recurring problem that brought you to hospital or if any fever,nausea/vomiting,abdominal pain, uncontrolled pain, chest pain,  shortness of breath or any other alarming symptoms.  Please follow-up your doctor as instructed in a week time and call the office for appointment.  Please avoid alcohol, smoking, or any other illicit substance and maintain healthy habits including taking your  regular medications as prescribed.  You were cared for by a hospitalist during your hospital stay. If you have any questions about your discharge medications or the care you received while you were in the hospital after you are discharged, you can call the unit and ask to speak with the hospitalist on call if the hospitalist that took care of you is not available.  Once you  are discharged, your primary care physician will handle any further medical issues. Please note that NO REFILLS for any discharge medications will be authorized once you are discharged, as it is imperative that you return to your primary care physician (or establish a relationship with a primary care physician if you do not have one) for your aftercare needs so that they can reassess your need for medications and monitor your lab values   Increase activity slowly   Complete by: As directed       Allergies as of 11/12/2023       Reactions   Nitrofurantoin Macrocrystal Other (See Comments), Shortness Of Breath   Dizziness Other reaction(s): Other (See Comments), dizziness   Tramadol  Other (See Comments), Shortness Of Breath   Sweating Other reaction(s): Other (See Comments), sweating   Zestril [lisinopril] Itching, Swelling, Cough   Hydrocodone-acetaminophen  Nausea And Vomiting, Other (See Comments)   Dizziness Other reaction(s): Nausea And Vomiting, dizziness        Medication List     TAKE these medications    acetaminophen  650 MG CR tablet Commonly known as: TYLENOL  Take 1,300 mg by mouth 2 (two) times daily as needed for pain.   albuterol  0.63 MG/3ML nebulizer solution Commonly known as: ACCUNEB  Take 3 mLs (0.63 mg total) by nebulization every 6 (six) hours as needed for wheezing or shortness of breath.   albuterol  108 (90 Base) MCG/ACT inhaler Commonly known as: VENTOLIN  HFA Inhale 2 puffs into the lungs every 6 (six) hours as needed for wheezing or shortness of breath (Cough).   amLODipine  10 MG tablet Commonly known as: NORVASC  Take 10 mg by mouth at bedtime.   budesonide -formoterol  160-4.5 MCG/ACT inhaler Commonly known as: Symbicort  Inhale 2 puffs into the lungs every 12 (twelve) hours. What changed:  when to take this reasons to take this   cefadroxil 500 MG capsule Commonly known as: DURICEF Take 2 capsules (1,000 mg total) by mouth 2 (two) times daily for  4 days.   cyclobenzaprine  10 MG tablet Commonly known as: FLEXERIL  Take 1 tablet (10 mg total) by mouth 2 (two) times daily as needed for muscle spasms.   diclofenac  Sodium 1 % Gel Commonly known as: VOLTAREN  Apply 4 g topically 4 (four) times daily. What changed:  when to take this reasons to take this   gabapentin 100 MG capsule Commonly known as: NEURONTIN Take 100 mg by mouth daily.   hydrochlorothiazide  12.5 MG tablet Commonly known as: HYDRODIURIL  Take 12.5 mg by mouth in the morning.   levonorgestrel 20 MCG/24HR IUD Commonly known as: MIRENA 1 each by Intrauterine route once. Implanted October 2014   losartan  100 MG tablet Commonly known as: COZAAR  Take 1 tablet (100 mg total) by mouth daily.   Ozempic (1 MG/DOSE) 4 MG/3ML Sopn Generic drug: Semaglutide (1 MG/DOSE) Inject 1 mg into the skin every 7 (seven) days.        Follow-up Information     [facility] Follow up in 1 week(s).   Specialty: Home Health Services Contact information: [address] [place] [address]  [phone]                Allergies  Allergen Reactions   Nitrofurantoin Macrocrystal Other (See Comments) and Shortness Of Breath    Dizziness  Other reaction(s): Other (See Comments), dizziness   Tramadol  Other (See Comments) and Shortness Of Breath    Sweating  Other reaction(s): Other (See Comments), sweating   Zestril [Lisinopril] Itching, Swelling and Cough   Hydrocodone-Acetaminophen  Nausea And Vomiting and Other (See Comments)    Dizziness  Other reaction(s): Nausea And Vomiting, dizziness    The results of significant diagnostics from this hospitalization (including imaging, microbiology, ancillary and laboratory) are listed below for reference.    Microbiology: Recent Results (from the past 240 hours)  Urine Culture     Status: Abnormal   Collection Time: 11/09/23 11:53 AM   Specimen: Urine, Clean Catch  Result Value Ref  Range Status   Specimen Description   Final    URINE, CLEAN CATCH Performed at [facility], [address] [place], [place] [address]    Special Requests   Final    NONE Performed at [facility], [address], [place], [place] [address]    Culture 70,000 COLONIES/mL ESCHERICHIA COLI (A)  Final   Report Status 11/11/2023 FINAL  Final   Organism ID, Bacteria ESCHERICHIA COLI (A)  Final      Susceptibility   Escherichia coli - MIC*    AMPICILLIN <=2 SENSITIVE Sensitive     CEFAZOLIN <=4 SENSITIVE Sensitive     CEFEPIME <=0.12 SENSITIVE Sensitive     CEFTRIAXONE  <=0.25 SENSITIVE Sensitive     CIPROFLOXACIN  <=0.25 SENSITIVE Sensitive     GENTAMICIN <=1 SENSITIVE Sensitive     IMIPENEM <=0.25 SENSITIVE Sensitive     NITROFURANTOIN <=16 SENSITIVE Sensitive     TRIMETH/SULFA <=20 SENSITIVE Sensitive     AMPICILLIN/SULBACTAM <=2 SENSITIVE Sensitive     PIP/TAZO <=4 SENSITIVE Sensitive ug/mL    * 70,000 COLONIES/mL ESCHERICHIA COLI  Resp panel by RT-PCR (RSV, Flu A&B, Covid) Anterior Nasal Swab     Status: None   Collection Time: 11/09/23 12:58 PM   Specimen: Anterior Nasal Swab  Result Value Ref Range Status   SARS Coronavirus 2 by RT PCR NEGATIVE NEGATIVE Final    Comment: (NOTE) SARS-CoV-2 target nucleic acids are NOT DETECTED.  The SARS-CoV-2 RNA is generally detectable in upper respiratory specimens during the acute phase of infection. The lowest concentration of SARS-CoV-2 viral copies this assay can detect is 138 copies/mL. A negative result does not preclude SARS-Cov-2 infection and should not be used as the sole basis for treatment or other patient management decisions. A negative result may occur with  improper specimen collection/handling, submission of specimen other than nasopharyngeal swab, presence of viral mutation(s) within the areas targeted by this assay, and inadequate number of viral copies(<138 copies/mL). A negative  result must be combined with clinical observations, patient history, and epidemiological information. The expected result is Negative.  Fact Sheet for Patients:  BloggerCourse.com  Fact Sheet for Healthcare Providers:  SeriousBroker.it  This test is no t yet approved or cleared by the [place]  FDA and  has been authorized for detection and/or diagnosis of SARS-CoV-2 by FDA under an Emergency Use Authorization (EUA). This EUA will remain  in effect (meaning this test can be used) for the duration of the COVID-19 declaration under Section 564(b)(1) of the Act, 21 U.S.C.section 360bbb-3(b)(1), unless the authorization is terminated  or revoked sooner.  Influenza A by PCR NEGATIVE NEGATIVE Final   Influenza B by PCR NEGATIVE NEGATIVE Final    Comment: (NOTE) The Xpert Xpress SARS-CoV-2/FLU/RSV plus assay is intended as an aid in the diagnosis of influenza from Nasopharyngeal swab specimens and should not be used as a sole basis for treatment. Nasal washings and aspirates are unacceptable for Xpert Xpress SARS-CoV-2/FLU/RSV testing.  Fact Sheet for Patients: BloggerCourse.com  Fact Sheet for Healthcare Providers: SeriousBroker.it  This test is not yet approved or cleared by the [place]  FDA and has been authorized for detection and/or diagnosis of SARS-CoV-2 by FDA under an Emergency Use Authorization (EUA). This EUA will remain in effect (meaning this test can be used) for the duration of the COVID-19 declaration under Section 564(b)(1) of the Act, 21 U.S.C. section 360bbb-3(b)(1), unless the authorization is terminated or revoked.     Resp Syncytial Virus by PCR NEGATIVE NEGATIVE Final    Comment: (NOTE) Fact Sheet for Patients: BloggerCourse.com  Fact Sheet for Healthcare Providers: SeriousBroker.it  This  test is not yet approved or cleared by the [place]  FDA and has been authorized for detection and/or diagnosis of SARS-CoV-2 by FDA under an Emergency Use Authorization (EUA). This EUA will remain in effect (meaning this test can be used) for the duration of the COVID-19 declaration under Section 564(b)(1) of the Act, 21 U.S.C. section 360bbb-3(b)(1), unless the authorization is terminated or revoked.  Performed at [facility], [address], [place], [place] [address]   Blood culture (routine x 2)     Status: Abnormal   Collection Time: 11/09/23 12:58 PM   Specimen: BLOOD  Result Value Ref Range Status   Specimen Description   Final    BLOOD BLOOD RIGHT ARM Performed at [facility], [address] [place], [place] [address]    Special Requests   Final    BOTTLES DRAWN AEROBIC AND ANAEROBIC Blood Culture adequate volume Performed at [facility], [address], [place], [place] [address]    Culture  Setup Time   Final    GRAM NEGATIVE RODS IN BOTH AEROBIC AND ANAEROBIC BOTTLES CRITICAL RESULT CALLED TO, READ BACK BY AND VERIFIED WITH: PHARMD M LILLISTON 11/10/2023 @ 0348 BY AB Performed at [facility], [address] [place], [place] [address]    Culture ESCHERICHIA COLI (A)  Final   Report Status 11/12/2023 FINAL  Final   Organism ID, Bacteria ESCHERICHIA COLI  Final   Organism ID, Bacteria ESCHERICHIA COLI  Final      Susceptibility   Escherichia coli - KIRBY BAUER*    CEFAZOLIN SENSITIVE Sensitive    Escherichia coli - MIC*    AMPICILLIN <=2 SENSITIVE Sensitive     CEFEPIME <=0.12 SENSITIVE Sensitive     CEFTAZIDIME <=1 SENSITIVE Sensitive     CEFTRIAXONE  <=0.25 SENSITIVE Sensitive     CIPROFLOXACIN  <=0.25 SENSITIVE Sensitive     GENTAMICIN <=1 SENSITIVE Sensitive     IMIPENEM <=0.25 SENSITIVE Sensitive     TRIMETH/SULFA <=20 SENSITIVE Sensitive     AMPICILLIN/SULBACTAM <=2 SENSITIVE Sensitive      PIP/TAZO <=4 SENSITIVE Sensitive ug/mL    * ESCHERICHIA COLI    ESCHERICHIA COLI  Blood Culture ID Panel (Reflexed)     Status: Abnormal   Collection Time: 11/09/23 12:58 PM  Result Value Ref Range Status   Enterococcus faecalis NOT DETECTED NOT DETECTED Final   Enterococcus Faecium NOT DETECTED NOT DETECTED Final   Listeria monocytogenes NOT  DETECTED NOT DETECTED Final   Staphylococcus species NOT DETECTED NOT DETECTED Final   Staphylococcus aureus (BCID) NOT DETECTED NOT DETECTED Final   Staphylococcus epidermidis NOT DETECTED NOT DETECTED Final   Staphylococcus lugdunensis NOT DETECTED NOT DETECTED Final   Streptococcus species NOT DETECTED NOT DETECTED Final   Streptococcus agalactiae NOT DETECTED NOT DETECTED Final   Streptococcus pneumoniae NOT DETECTED NOT DETECTED Final   Streptococcus pyogenes NOT DETECTED NOT DETECTED Final   A.calcoaceticus-baumannii NOT DETECTED NOT DETECTED Final   Bacteroides fragilis NOT DETECTED NOT DETECTED Final   Enterobacterales DETECTED (A) NOT DETECTED Final    Comment: Enterobacterales represent a large order of gram negative bacteria, not a single organism. CRITICAL RESULT CALLED TO, READ BACK BY AND VERIFIED WITH: PHARMD M LILLISTON 11/10/2023 @ 0348 BY AB    Enterobacter cloacae complex NOT DETECTED NOT DETECTED Final   Escherichia coli DETECTED (A) NOT DETECTED Final    Comment: CRITICAL RESULT CALLED TO, READ BACK BY AND VERIFIED WITH: PHARMD M LILLISTON 11/10/2023 @ 0348 BY AB    Klebsiella aerogenes NOT DETECTED NOT DETECTED Final   Klebsiella oxytoca NOT DETECTED NOT DETECTED Final   Klebsiella pneumoniae NOT DETECTED NOT DETECTED Final   Proteus species NOT DETECTED NOT DETECTED Final   Salmonella species NOT DETECTED NOT DETECTED Final   Serratia marcescens NOT DETECTED NOT DETECTED Final   Haemophilus influenzae NOT DETECTED NOT DETECTED Final   Neisseria meningitidis NOT DETECTED NOT DETECTED Final   Pseudomonas aeruginosa NOT  DETECTED NOT DETECTED Final   Stenotrophomonas maltophilia NOT DETECTED NOT DETECTED Final   Candida albicans NOT DETECTED NOT DETECTED Final   Candida auris NOT DETECTED NOT DETECTED Final   Candida glabrata NOT DETECTED NOT DETECTED Final   Candida krusei NOT DETECTED NOT DETECTED Final   Candida parapsilosis NOT DETECTED NOT DETECTED Final   Candida tropicalis NOT DETECTED NOT DETECTED Final   Cryptococcus neoformans/gattii NOT DETECTED NOT DETECTED Final   CTX-M ESBL NOT DETECTED NOT DETECTED Final   Carbapenem resistance IMP NOT DETECTED NOT DETECTED Final   Carbapenem resistance KPC NOT DETECTED NOT DETECTED Final   Carbapenem resistance NDM NOT DETECTED NOT DETECTED Final   Carbapenem resist OXA 48 LIKE NOT DETECTED NOT DETECTED Final   Carbapenem resistance VIM NOT DETECTED NOT DETECTED Final    Comment: Performed at [facility], [address] [place], [place] [address]  Blood culture (routine x 2)     Status: Abnormal   Collection Time: 11/09/23  1:52 PM   Specimen: BLOOD  Result Value Ref Range Status   Specimen Description   Final    BLOOD LEFT ANTECUBITAL Performed at [facility], [address], [place], [place] [address]    Special Requests   Final    BOTTLES DRAWN AEROBIC ONLY Blood Culture results may not be optimal due to an inadequate volume of blood received in culture bottles Performed at [facility], [address] [place], [place] [address]    Culture  Setup Time   Final    GRAM NEGATIVE RODS AEROBIC BOTTLE ONLY CRITICAL VALUE NOTED.  VALUE IS CONSISTENT WITH PREVIOUSLY REPORTED AND CALLED VALUE.    Culture (A)  Final    ESCHERICHIA COLI SUSCEPTIBILITIES PERFORMED ON PREVIOUS CULTURE WITHIN THE LAST 5 DAYS. Performed at [facility], [address] [place], [place] [address]    Report Status 11/12/2023 FINAL  Final    Procedures/Studies: CT ABDOMEN PELVIS W CONTRAST Result  Date:  11/09/2023 CLINICAL DATA:  Abdominal pain, acute, nonlocalized Technologist notes state right flank pain with chills, fatigue and urinary pressure. EXAM: CT ABDOMEN AND PELVIS WITH CONTRAST TECHNIQUE: Multidetector CT imaging of the abdomen and pelvis was performed using the standard protocol following bolus administration of intravenous contrast. RADIATION DOSE REDUCTION: This exam was performed according to the departmental dose-optimization program which includes automated exposure control, adjustment of the mA and/or kV according to patient size and/or use of iterative reconstruction technique. CONTRAST:  OMNIPAQUE  IOHEXOL  300 MG/ML  SOLN COMPARISON:  CT 09/13/2021 FINDINGS: Lower chest: Heterogeneous pulmonary parenchyma at the lung bases. No focal airspace disease or pleural effusion. Hepatobiliary: The liver is enlarged spanning 20.1 cm cranial caudal with mild steatosis. No focal liver abnormality. Clips in the gallbladder fossa postcholecystectomy. No biliary dilatation. Pancreas: No ductal dilatation or inflammation. Spleen: Normal in size without focal abnormality. Adrenals/Urinary Tract: The adrenal glands are normal. There is heterogeneous enhancement of the upper pole of the right kidney, for example series 2, image 28. Mild right urothelial enhancement. No hydronephrosis. Occasional areas of right ureteral enhancement. Small cyst in the upper right kidney. No further follow-up imaging is recommended. No renal calculi. The left kidney is normal in appearance. There is no definite bladder wall thickening. Stomach/Bowel: Tiny hiatal hernia. No bowel obstruction or inflammation. Normal appendix visualized. Small to moderate volume of stool in the colon. Multifocal colonic diverticulosis. No diverticulitis. Vascular/Lymphatic: No acute vascular findings. Normal caliber abdominal aorta. No enlarged lymph nodes in the abdomen or pelvis. Reproductive: IUD within the uterus appears to be appropriately  positioned. No suspicious adnexal mass. Other: No free air, free fluid, or intra-abdominal fluid collection. Small fat containing left inguinal hernia. Small fat containing umbilical hernia. Musculoskeletal: There are no acute or suspicious osseous abnormalities. IMPRESSION: 1. Heterogeneous enhancement of the upper pole of the right kidney with mild right urothelial enhancement, suspicious for pyelonephritis. Recommend correlation with urinalysis. 2. Colonic diverticulosis without diverticulitis. 3. Hepatomegaly and hepatic steatosis. Electronically Signed   By: Andrea Gasman M.D.   On: 11/09/2023 15:18   DG Chest 2 View Result Date: 11/09/2023 CLINICAL DATA:  Fatigue, chills, right flank pain. EXAM: CHEST - 2 VIEW COMPARISON:  03/28/2022 FINDINGS: The cardiomediastinal contours are normal. The lungs are clear. Pulmonary vasculature is normal. No consolidation, pleural effusion, or pneumothorax. No acute osseous abnormalities are seen. IMPRESSION: No active cardiopulmonary disease.  No evidence of pneumonia. Electronically Signed   By: Andrea Gasman M.D.   On: 11/09/2023 15:03    Labs: BNP (last 3 results) No results for input(s): BNP in the last 8760 hours. Basic Metabolic Panel: Recent Labs  Lab 11/09/23 1124 11/09/23 1307 11/09/23 1511 11/10/23 0822 11/11/23 0553  NA 135 137  --  135 137  K 2.8* 3.0*  --  3.5 3.3*  CL 99 97*  --  100 102  CO2 25  --   --  26 27  GLUCOSE 130* 116*  --  170* 114*  BUN 9 7  --  9 <5*  CREATININE 0.68 0.70  --  0.71 <0.30*  CALCIUM 9.1  --   --  8.3* 8.6*  MG  --   --  1.8  --   --    Liver Function Tests: Recent Labs  Lab 11/09/23 1124  AST 23  ALT 23  ALKPHOS 80  BILITOT 1.6*  PROT 7.5  ALBUMIN 3.9   No results for input(s): LIPASE, AMYLASE in the last 168 hours.  No results for input(s): AMMONIA in the last 168 hours. CBC: Recent Labs  Lab 11/09/23 1124 11/09/23 1307 11/10/23 0822 11/11/23 0553  WBC 15.2*  --  9.3 6.3   NEUTROABS 12.0*  --   --   --   HGB 13.2 10.9* 11.6* 11.5*  HCT 40.4 32.0* 35.8* 34.7*  MCV 83.0  --  85.2 84.6  PLT 311  --  234 237   CBG: Recent Labs  Lab 11/11/23 0724 11/11/23 1136 11/11/23 1640 11/11/23 2100 11/12/23 0735  GLUCAP 121* 162* 175* 162* 120*   Hgb A1c Recent Labs    11/10/23 0822  HGBA1C 6.2*   Urinalysis    Component Value Date/Time   COLORURINE YELLOW 11/09/2023 1153   APPEARANCEUR HAZY (A) 11/09/2023 1153   LABSPEC 1.012 11/09/2023 1153   PHURINE 6.0 11/09/2023 1153   GLUCOSEU NEGATIVE 11/09/2023 1153   HGBUR MODERATE (A) 11/09/2023 1153   BILIRUBINUR NEGATIVE 11/09/2023 1153   KETONESUR 5 (A) 11/09/2023 1153   PROTEINUR NEGATIVE 11/09/2023 1153   UROBILINOGEN 1.0 08/20/2014 2001   NITRITE POSITIVE (A) 11/09/2023 1153   LEUKOCYTESUR LARGE (A) 11/09/2023 1153   Sepsis Labs Recent Labs  Lab 11/09/23 1124 11/10/23 0822 11/11/23 0553  WBC 15.2* 9.3 6.3   Microbiology Recent Results (from the past 240 hours)  Urine Culture     Status: Abnormal   Collection Time: 11/09/23 11:53 AM   Specimen: Urine, Clean Catch  Result Value Ref Range Status   Specimen Description   Final    URINE, CLEAN CATCH Performed at [facility], [address] [place], [place] [address]    Special Requests   Final    NONE Performed at [facility], [address] [place], [place] [address]    Culture 70,000 COLONIES/mL ESCHERICHIA COLI (A)  Final   Report Status 11/11/2023 FINAL  Final   Organism ID, Bacteria ESCHERICHIA COLI (A)  Final      Susceptibility   Escherichia coli - MIC*    AMPICILLIN <=2 SENSITIVE Sensitive     CEFAZOLIN <=4 SENSITIVE Sensitive     CEFEPIME <=0.12 SENSITIVE Sensitive     CEFTRIAXONE  <=0.25 SENSITIVE Sensitive     CIPROFLOXACIN  <=0.25 SENSITIVE Sensitive     GENTAMICIN <=1 SENSITIVE Sensitive     IMIPENEM <=0.25 SENSITIVE Sensitive     NITROFURANTOIN <=16 SENSITIVE Sensitive      TRIMETH/SULFA <=20 SENSITIVE Sensitive     AMPICILLIN/SULBACTAM <=2 SENSITIVE Sensitive     PIP/TAZO <=4 SENSITIVE Sensitive ug/mL    * 70,000 COLONIES/mL ESCHERICHIA COLI  Resp panel by RT-PCR (RSV, Flu A&B, Covid) Anterior Nasal Swab     Status: None   Collection Time: 11/09/23 12:58 PM   Specimen: Anterior Nasal Swab  Result Value Ref Range Status   SARS Coronavirus 2 by RT PCR NEGATIVE NEGATIVE Final    Comment: (NOTE) SARS-CoV-2 target nucleic acids are NOT DETECTED.  The SARS-CoV-2 RNA is generally detectable in upper respiratory specimens during the acute phase of infection. The lowest concentration of SARS-CoV-2 viral copies this assay can detect is 138 copies/mL. A negative result does not preclude SARS-Cov-2 infection and should not be used as the sole basis for treatment or other patient management decisions. A negative result may occur with  improper specimen collection/handling, submission of specimen other than nasopharyngeal swab, presence of viral mutation(s) within the areas targeted by this assay, and inadequate number of viral copies(<138 copies/mL). A negative result must be combined with clinical  observations, patient history, and epidemiological information. The expected result is Negative.  Fact Sheet for Patients:  BloggerCourse.com  Fact Sheet for Healthcare Providers:  SeriousBroker.it  This test is no t yet approved or cleared by the [place]  FDA and  has been authorized for detection and/or diagnosis of SARS-CoV-2 by FDA under an Emergency Use Authorization (EUA). This EUA will remain  in effect (meaning this test can be used) for the duration of the COVID-19 declaration under Section 564(b)(1) of the Act, 21 U.S.C.section 360bbb-3(b)(1), unless the authorization is terminated  or revoked sooner.       Influenza A by PCR NEGATIVE NEGATIVE Final   Influenza B by PCR NEGATIVE NEGATIVE Final     Comment: (NOTE) The Xpert Xpress SARS-CoV-2/FLU/RSV plus assay is intended as an aid in the diagnosis of influenza from Nasopharyngeal swab specimens and should not be used as a sole basis for treatment. Nasal washings and aspirates are unacceptable for Xpert Xpress SARS-CoV-2/FLU/RSV testing.  Fact Sheet for Patients: BloggerCourse.com  Fact Sheet for Healthcare Providers: SeriousBroker.it  This test is not yet approved or cleared by the [place]  FDA and has been authorized for detection and/or diagnosis of SARS-CoV-2 by FDA under an Emergency Use Authorization (EUA). This EUA will remain in effect (meaning this test can be used) for the duration of the COVID-19 declaration under Section 564(b)(1) of the Act, 21 U.S.C. section 360bbb-3(b)(1), unless the authorization is terminated or revoked.     Resp Syncytial Virus by PCR NEGATIVE NEGATIVE Final    Comment: (NOTE) Fact Sheet for Patients: BloggerCourse.com  Fact Sheet for Healthcare Providers: SeriousBroker.it  This test is not yet approved or cleared by the [place]  FDA and has been authorized for detection and/or diagnosis of SARS-CoV-2 by FDA under an Emergency Use Authorization (EUA). This EUA will remain in effect (meaning this test can be used) for the duration of the COVID-19 declaration under Section 564(b)(1) of the Act, 21 U.S.C. section 360bbb-3(b)(1), unless the authorization is terminated or revoked.  Performed at [facility], [address] [place], [place] [address]   Blood culture (routine x 2)     Status: Abnormal   Collection Time: 11/09/23 12:58 PM   Specimen: BLOOD  Result Value Ref Range Status   Specimen Description   Final    BLOOD BLOOD RIGHT ARM Performed at [facility], [address], [place], [place] [address]    Special Requests   Final     BOTTLES DRAWN AEROBIC AND ANAEROBIC Blood Culture adequate volume Performed at [facility], [address], [place], [place] [address]    Culture  Setup Time   Final    GRAM NEGATIVE RODS IN BOTH AEROBIC AND ANAEROBIC BOTTLES CRITICAL RESULT CALLED TO, READ BACK BY AND VERIFIED WITH: PHARMD M LILLISTON 11/10/2023 @ 0348 BY AB Performed at [facility], [address], [place], [place] [address]    Culture ESCHERICHIA COLI (A)  Final   Report Status 11/12/2023 FINAL  Final   Organism ID, Bacteria ESCHERICHIA COLI  Final   Organism ID, Bacteria ESCHERICHIA COLI  Final      Susceptibility   Escherichia coli - KIRBY BAUER*    CEFAZOLIN SENSITIVE Sensitive    Escherichia coli - MIC*    AMPICILLIN <=2 SENSITIVE Sensitive     CEFEPIME <=0.12 SENSITIVE Sensitive     CEFTAZIDIME <=1 SENSITIVE Sensitive     CEFTRIAXONE  <=0.25 SENSITIVE Sensitive  CIPROFLOXACIN  <=0.25 SENSITIVE Sensitive     GENTAMICIN <=1 SENSITIVE Sensitive     IMIPENEM <=0.25 SENSITIVE Sensitive     TRIMETH/SULFA <=20 SENSITIVE Sensitive     AMPICILLIN/SULBACTAM <=2 SENSITIVE Sensitive     PIP/TAZO <=4 SENSITIVE Sensitive ug/mL    * ESCHERICHIA COLI    ESCHERICHIA COLI  Blood Culture ID Panel (Reflexed)     Status: Abnormal   Collection Time: 11/09/23 12:58 PM  Result Value Ref Range Status   Enterococcus faecalis NOT DETECTED NOT DETECTED Final   Enterococcus Faecium NOT DETECTED NOT DETECTED Final   Listeria monocytogenes NOT DETECTED NOT DETECTED Final   Staphylococcus species NOT DETECTED NOT DETECTED Final   Staphylococcus aureus (BCID) NOT DETECTED NOT DETECTED Final   Staphylococcus epidermidis NOT DETECTED NOT DETECTED Final   Staphylococcus lugdunensis NOT DETECTED NOT DETECTED Final   Streptococcus species NOT DETECTED NOT DETECTED Final   Streptococcus agalactiae NOT DETECTED NOT DETECTED Final   Streptococcus pneumoniae NOT DETECTED NOT DETECTED Final   Streptococcus pyogenes  NOT DETECTED NOT DETECTED Final   A.calcoaceticus-baumannii NOT DETECTED NOT DETECTED Final   Bacteroides fragilis NOT DETECTED NOT DETECTED Final   Enterobacterales DETECTED (A) NOT DETECTED Final    Comment: Enterobacterales represent a large order of gram negative bacteria, not a single organism. CRITICAL RESULT CALLED TO, READ BACK BY AND VERIFIED WITH: PHARMD M LILLISTON 11/10/2023 @ 0348 BY AB    Enterobacter cloacae complex NOT DETECTED NOT DETECTED Final   Escherichia coli DETECTED (A) NOT DETECTED Final    Comment: CRITICAL RESULT CALLED TO, READ BACK BY AND VERIFIED WITH: PHARMD M LILLISTON 11/10/2023 @ 0348 BY AB    Klebsiella aerogenes NOT DETECTED NOT DETECTED Final   Klebsiella oxytoca NOT DETECTED NOT DETECTED Final   Klebsiella pneumoniae NOT DETECTED NOT DETECTED Final   Proteus species NOT DETECTED NOT DETECTED Final   Salmonella species NOT DETECTED NOT DETECTED Final   Serratia marcescens NOT DETECTED NOT DETECTED Final   Haemophilus influenzae NOT DETECTED NOT DETECTED Final   Neisseria meningitidis NOT DETECTED NOT DETECTED Final   Pseudomonas aeruginosa NOT DETECTED NOT DETECTED Final   Stenotrophomonas maltophilia NOT DETECTED NOT DETECTED Final   Candida albicans NOT DETECTED NOT DETECTED Final   Candida auris NOT DETECTED NOT DETECTED Final   Candida glabrata NOT DETECTED NOT DETECTED Final   Candida krusei NOT DETECTED NOT DETECTED Final   Candida parapsilosis NOT DETECTED NOT DETECTED Final   Candida tropicalis NOT DETECTED NOT DETECTED Final   Cryptococcus neoformans/gattii NOT DETECTED NOT DETECTED Final   CTX-M ESBL NOT DETECTED NOT DETECTED Final   Carbapenem resistance IMP NOT DETECTED NOT DETECTED Final   Carbapenem resistance KPC NOT DETECTED NOT DETECTED Final   Carbapenem resistance NDM NOT DETECTED NOT DETECTED Final   Carbapenem resist OXA 48 LIKE NOT DETECTED NOT DETECTED Final   Carbapenem resistance VIM NOT DETECTED NOT DETECTED Final     Comment: Performed at [facility], [address], [place], [place] [address]  Blood culture (routine x 2)     Status: Abnormal   Collection Time: 11/09/23  1:52 PM   Specimen: BLOOD  Result Value Ref Range Status   Specimen Description   Final    BLOOD LEFT ANTECUBITAL Performed at [facility], [address], [place], [place] [address]    Special Requests   Final    BOTTLES DRAWN AEROBIC ONLY Blood Culture results may not be optimal due to an inadequate volume  of blood received in culture bottles Performed at [facility], [address] [place], [place] [address]    Culture  Setup Time   Final    GRAM NEGATIVE RODS AEROBIC BOTTLE ONLY CRITICAL VALUE NOTED.  VALUE IS CONSISTENT WITH PREVIOUSLY REPORTED AND CALLED VALUE.    Culture (A)  Final    ESCHERICHIA COLI SUSCEPTIBILITIES PERFORMED ON PREVIOUS CULTURE WITHIN THE LAST 5 DAYS. Performed at [facility], [address] [place], [place] [address]    Report Status 11/12/2023 FINAL  Final   Time coordinating discharge: 35 minutes  SIGNED: Mennie LAMY, MD  [facility] 11/12/2023, 9:56 AM  If 7PM-7AM, please contact night-coverage [email or web address]

## 2024-03-28 ENCOUNTER — Other Ambulatory Visit: Payer: Self-pay

## 2024-03-28 ENCOUNTER — Ambulatory Visit (HOSPITAL_COMMUNITY)
Admission: EM | Admit: 2024-03-28 | Discharge: 2024-03-28 | Disposition: A | Discharge status: Home / Self Care | Attending: Emergency Medicine | Admitting: Emergency Medicine

## 2024-03-28 ENCOUNTER — Encounter (HOSPITAL_COMMUNITY): Payer: Self-pay

## 2024-03-28 VITALS — BP 137/87 | HR 94 | Temp 98.2°F | Resp 18

## 2024-03-28 DIAGNOSIS — R051 Acute cough: Secondary | ICD-10-CM

## 2024-03-28 DIAGNOSIS — J040 Acute laryngitis: Secondary | ICD-10-CM

## 2024-03-28 DIAGNOSIS — H9203 Otalgia, bilateral: Secondary | ICD-10-CM

## 2024-03-28 MED ORDER — DEXAMETHASONE SOD PHOSPHATE PF 10 MG/ML IJ SOLN
10.0000 mg | Freq: Once | INTRAMUSCULAR | Status: AC
  Administered 2024-03-28: 10 mg via INTRAMUSCULAR

## 2024-03-28 NOTE — Discharge Instructions (Signed)
 We have given you a steroid injection to help with your ear pain and the voice loss.  Continue to rest your voice over the next few days.  Warm saline gargles, tea with honey and sleeping with a [place] fire may help as well.  You can alternate between 800 mg of ibuprofen  and 500 mg of Tylenol  every 4-6 hours to help with fever, aches and pain.  This is most likely related to a viral illness and should improve by the end of the week.  If no improvement or any changes return to clinic for reevaluation.

## 2024-03-28 NOTE — ED Triage Notes (Signed)
 Pt here for bilateral ear pain and congestion with some cough x 4 days

## 2024-03-28 NOTE — ED Provider Notes (Signed)
 [facility]    CSN: [id number] Arrival date & time: 03/28/24  0801      History   Chief Complaint Chief Complaint  Patient presents with   Otalgia    HPI Robin Davis is a 43 y.o. female.   Patient presents to clinic over concern of bilateral ear pain, rhinorrhea, voice loss, scratchy throat and a intermittent dry cough for the past 4 days.  Noticed that voice loss started after taking 6 days in a row.  Has tried tea at home for the voice loss.  Did rest her voice yesterday and is able to speak a little bit better today.  Does have a history of asthma.  Has not had wheezing or shortness of breath and has not had to use her albuterol  inhaler.  Denies fever, body aches or chills.  Denies nasal congestion.  Denies recent sick contacts.  History of type 2 diabetes, most recent A1c was 6.2.  Well-controlled.  The history is provided by the patient and medical records.  Otalgia   Past Medical History:  Diagnosis Date   Anxiety    Asthma    COVID-19    Diabetes mellitus without complication (HCC)    Foot pain    Hypertension    Obesity     Patient Active Problem List   Diagnosis Date Noted   Pyelonephritis 11/09/2023   Acute asthma exacerbation 03/29/2022   Atypical chest pain 03/29/2022   SIRS (systemic inflammatory response syndrome) (HCC) 03/29/2022   GAD (generalized anxiety disorder) 03/29/2022   DM2 (diabetes mellitus, type 2) (HCC) 03/29/2022   Diverticulitis 10/15/2021   Encounter for gynecological examination without abnormal finding 10/15/2021   IUD (intrauterine device) in place 10/15/2021   Sinus tachycardia 01/27/2020   Essential hypertension 01/27/2020   Pneumonia due to COVID-19 virus 01/24/2020   Depression 01/24/2020   Hypokalemia 01/24/2020   Acute respiratory failure with hypoxia (HCC) 01/24/2020   Snoring 02/22/2019    Past Surgical History:  Procedure Laterality Date   BREAST REDUCTION SURGERY     CHOLECYSTECTOMY      REDUCTION MAMMAPLASTY  2006    OB History     Gravida  5   Para  2   Term  2   Preterm  0   AB  2   Living  2      SAB  0   IAB  2   Ectopic  0   Multiple  0   Live Births  2            Home Medications    Prior to Admission medications   Medication Sig Start Date End Date Taking? Authorizing Provider  acetaminophen  (TYLENOL ) 650 MG CR tablet Take 1,300 mg by mouth 2 (two) times daily as needed for pain.    [provider]  albuterol  (ACCUNEB ) 0.63 MG/3ML nebulizer solution Take 3 mLs (0.63 mg total) by nebulization every 6 (six) hours as needed for wheezing or shortness of breath. 11/18/22 11/10/23  Joesph Shaver Scales, PA-C  albuterol  (VENTOLIN  HFA) 108 (90 Base) MCG/ACT inhaler Inhale 2 puffs into the lungs every 6 (six) hours as needed for wheezing or shortness of breath (Cough). 11/18/22   Joesph Shaver Scales, PA-C  amLODipine  (NORVASC ) 10 MG tablet Take 10 mg by mouth at bedtime. 12/28/19   [provider]  budesonide -formoterol  (SYMBICORT ) 160-4.5 MCG/ACT inhaler Inhale 2 puffs into the lungs every 12 (twelve) hours. Patient taking differently: Inhale 2 puffs into the lungs 2 (  two) times daily as needed (Asthma). 11/18/22 11/10/23  Joesph Shaver Scales, PA-C  cyclobenzaprine  (FLEXERIL ) 10 MG tablet Take 1 tablet (10 mg total) by mouth 2 (two) times daily as needed for muscle spasms. 04/20/23   Mesner, Selinda, MD  diclofenac  Sodium (VOLTAREN ) 1 % GEL Apply 4 g topically 4 (four) times daily. Patient taking differently: Apply 4 g topically 4 (four) times daily as needed (Pain). 12/09/22   McDonald, Juliene SAUNDERS, DPM  gabapentin  (NEURONTIN ) 100 MG capsule Take 100 mg by mouth daily.    [provider]  hydrochlorothiazide  (HYDRODIURIL ) 12.5 MG tablet Take 12.5 mg by mouth in the morning. 08/01/21   [provider]  levonorgestrel (MIRENA) 20 MCG/24HR IUD 1 each by Intrauterine route once. Implanted October 2014    [provider]   losartan  (COZAAR ) 100 MG tablet Take 1 tablet (100 mg total) by mouth daily. 01/29/20   Milissa Tod PARAS, MD  OZEMPIC, 1 MG/DOSE, 4 MG/3ML SOPN Inject 1 mg into the skin every 7 (seven) days. 10/21/23   [provider]    Family History Family History  Problem Relation Age of Onset   Hypertension Other    Diabetes Other     Social History Social History   Tobacco Use   Smoking status: Never    Passive exposure: Past   Smokeless tobacco: Never   Tobacco comments:    More than 20 years  Vaping Use   Vaping status: Never Used  Substance Use Topics   Alcohol use: Yes    Comment: socially   Drug use: No     Allergies   Nitrofurantoin macrocrystal, Tramadol , Zestril [lisinopril], and Hydrocodone-acetaminophen    Review of Systems Review of Systems  Per HPI  Physical Exam Triage Vital Signs ED Triage Vitals [03/28/24 0819]  Encounter Vitals Group     BP 137/87     Girls Systolic BP Percentile      Girls Diastolic BP Percentile      Boys Systolic BP Percentile      Boys Diastolic BP Percentile      Pulse Rate 94     Resp 18     Temp 98.2 F (36.8 C)     Temp Source Oral     SpO2 95 %     Weight      Height      Head Circumference      Peak Flow      Pain Score 6     Pain Loc      Pain Education      Exclude from Growth Chart    No data found.  Updated Vital Signs BP 137/87 (BP Location: Left Arm)   Pulse 94   Temp 98.2 F (36.8 C) (Oral)   Resp 18   SpO2 95%   Visual Acuity Right Eye Distance:   Left Eye Distance:   Bilateral Distance:    Right Eye Near:   Left Eye Near:    Bilateral Near:     Physical Exam Vitals and nursing note reviewed.  Constitutional:      Appearance: Normal appearance.  HENT:     Head: Normocephalic and atraumatic.     Right Ear: Tympanic membrane, ear canal and external ear normal.     Left Ear: Tympanic membrane, ear canal and external ear normal.     Nose: Rhinorrhea present.     Mouth/Throat:      Mouth: Mucous membranes are moist.     Pharynx: Posterior  oropharyngeal erythema present.  Eyes:     Conjunctiva/sclera: Conjunctivae normal.  Cardiovascular:     Rate and Rhythm: Normal rate and regular rhythm.     Heart sounds: Normal heart sounds. No murmur heard. Pulmonary:     Effort: Pulmonary effort is normal. No respiratory distress.     Breath sounds: Normal breath sounds. No wheezing.  Lymphadenopathy:     Cervical: Cervical adenopathy present.  Skin:    General: Skin is warm and dry.  Neurological:     General: No focal deficit present.     Mental Status: She is alert and oriented to person, place, and time.  Psychiatric:        Mood and Affect: Mood normal.        Behavior: Behavior normal.      UC Treatments / Results  Labs (all labs ordered are listed, but only abnormal results are displayed) Labs Reviewed - No data to display  EKG   Radiology No results found.  Procedures Procedures (including critical care time)  Medications Ordered in UC Medications  dexamethasone  (DECADRON ) injection 10 mg (has no administration in time range)    Initial Impression / Assessment and Plan / UC Course  I have reviewed the triage vital signs and the nursing notes.  Pertinent labs & imaging results that were available during my care of the patient were reviewed by me and considered in my medical decision making (see chart for details).  Vitals in triage reviewed, patient is hemodynamically stable.  Bilateral tympanic membranes with middle ear effusion, no evidence of otitis media.  Rhinorrhea and cervical LAD present.  Lungs vesicular, heart with regular rate and rhythm.  Voice loss present as well, suspect viral etiology and laryngitis.  COVID and flu testing deferred, afebrile without bodyaches.  Symptomatic management discussed.  IM steroid given in clinic to help with laryngitis and ear pain.  Plan of care, follow-up care return precautions given, no questions  at this time.  Work note provided.    Final Clinical Impressions(s) / UC Diagnoses   Final diagnoses:  Acute cough  Laryngitis, acute  Otalgia of both ears     Discharge Instructions      We have given you a steroid injection to help with your ear pain and the voice loss.  Continue to rest your voice over the next few days.  Warm saline gargles, tea with honey and sleeping with a [place] fire may help as well.  You can alternate between 800 mg of ibuprofen  and 500 mg of Tylenol  every 4-6 hours to help with fever, aches and pain.  This is most likely related to a viral illness and should improve by the end of the week.  If no improvement or any changes return to clinic for reevaluation.     ED Prescriptions   None    PDMP not reviewed this encounter.   Dreama, Irie Fiorello  N, FNP 03/28/24 [phone]

## 2024-04-18 ENCOUNTER — Other Ambulatory Visit: Payer: Self-pay | Admitting: Student

## 2024-04-18 DIAGNOSIS — Z1231 Encounter for screening mammogram for malignant neoplasm of breast: Secondary | ICD-10-CM

## 2024-06-03 ENCOUNTER — Ambulatory Visit: Admission: RE | Admit: 2024-06-03 | Source: Ambulatory Visit

## 2024-06-03 DIAGNOSIS — Z1231 Encounter for screening mammogram for malignant neoplasm of breast: Secondary | ICD-10-CM
